# Patient Record
Sex: Female | Born: 1965
Health system: Southern US, Community
[De-identification: ages and names within clinical notes are randomized; demographics above are authoritative.]

## PROBLEM LIST (undated history)

## (undated) DIAGNOSIS — M1990 Unspecified osteoarthritis, unspecified site: Secondary | ICD-10-CM

## (undated) DIAGNOSIS — J8410 Pulmonary fibrosis, unspecified: Secondary | ICD-10-CM

## (undated) DIAGNOSIS — D649 Anemia, unspecified: Secondary | ICD-10-CM

## (undated) DIAGNOSIS — K219 Gastro-esophageal reflux disease without esophagitis: Secondary | ICD-10-CM

## (undated) HISTORY — DX: Unspecified osteoarthritis, unspecified site: M19.90

## (undated) HISTORY — DX: Gastro-esophageal reflux disease without esophagitis: K21.9

## (undated) HISTORY — DX: Anemia, unspecified: D64.9

---

## 1997-06-20 ENCOUNTER — Inpatient Hospital Stay (HOSPITAL_COMMUNITY): Admission: AD | Admit: 1997-06-20 | Discharge: 1997-06-20 | Payer: Self-pay | Admitting: Obstetrics

## 1997-08-31 ENCOUNTER — Ambulatory Visit (HOSPITAL_COMMUNITY): Admission: RE | Admit: 1997-08-31 | Discharge: 1997-08-31 | Payer: Self-pay | Admitting: Obstetrics & Gynecology

## 1997-12-21 ENCOUNTER — Inpatient Hospital Stay (HOSPITAL_COMMUNITY): Admission: AD | Admit: 1997-12-21 | Discharge: 1997-12-21 | Payer: Self-pay | Admitting: Obstetrics

## 1997-12-30 ENCOUNTER — Inpatient Hospital Stay (HOSPITAL_COMMUNITY): Admission: AD | Admit: 1997-12-30 | Discharge: 1998-01-01 | Payer: Self-pay | Admitting: *Deleted

## 1999-07-09 ENCOUNTER — Encounter: Admission: RE | Admit: 1999-07-09 | Discharge: 1999-07-09 | Payer: Self-pay | Admitting: Family Medicine

## 2001-06-06 ENCOUNTER — Encounter (INDEPENDENT_AMBULATORY_CARE_PROVIDER_SITE_OTHER): Payer: Self-pay | Admitting: *Deleted

## 2001-06-06 LAB — CONVERTED CEMR LAB

## 2002-03-01 ENCOUNTER — Encounter: Admission: RE | Admit: 2002-03-01 | Discharge: 2002-03-01 | Payer: Self-pay | Admitting: Family Medicine

## 2002-03-29 ENCOUNTER — Encounter: Admission: RE | Admit: 2002-03-29 | Discharge: 2002-03-29 | Payer: Self-pay | Admitting: Sports Medicine

## 2002-05-10 ENCOUNTER — Encounter: Admission: RE | Admit: 2002-05-10 | Discharge: 2002-05-10 | Payer: Self-pay | Admitting: Family Medicine

## 2002-09-22 ENCOUNTER — Encounter: Admission: RE | Admit: 2002-09-22 | Discharge: 2002-09-22 | Payer: Self-pay | Admitting: Family Medicine

## 2002-10-04 ENCOUNTER — Encounter: Admission: RE | Admit: 2002-10-04 | Discharge: 2002-10-04 | Payer: Self-pay | Admitting: Family Medicine

## 2003-02-26 ENCOUNTER — Encounter: Payer: Self-pay | Admitting: Family Medicine

## 2003-02-26 ENCOUNTER — Emergency Department (HOSPITAL_COMMUNITY): Admission: AD | Admit: 2003-02-26 | Discharge: 2003-02-26 | Payer: Self-pay | Admitting: Family Medicine

## 2003-03-01 ENCOUNTER — Encounter: Admission: RE | Admit: 2003-03-01 | Discharge: 2003-03-01 | Payer: Self-pay | Admitting: Sports Medicine

## 2003-08-24 ENCOUNTER — Encounter: Admission: RE | Admit: 2003-08-24 | Discharge: 2003-08-24 | Payer: Self-pay | Admitting: Family Medicine

## 2004-01-13 ENCOUNTER — Emergency Department (HOSPITAL_COMMUNITY): Admission: EM | Admit: 2004-01-13 | Discharge: 2004-01-13 | Payer: Self-pay | Admitting: Emergency Medicine

## 2004-06-14 ENCOUNTER — Ambulatory Visit: Payer: Self-pay | Admitting: Family Medicine

## 2004-10-19 ENCOUNTER — Ambulatory Visit: Payer: Self-pay | Admitting: Sports Medicine

## 2006-07-03 DIAGNOSIS — K649 Unspecified hemorrhoids: Secondary | ICD-10-CM | POA: Insufficient documentation

## 2006-07-03 DIAGNOSIS — F339 Major depressive disorder, recurrent, unspecified: Secondary | ICD-10-CM | POA: Insufficient documentation

## 2006-07-04 ENCOUNTER — Encounter (INDEPENDENT_AMBULATORY_CARE_PROVIDER_SITE_OTHER): Payer: Self-pay | Admitting: *Deleted

## 2006-12-01 ENCOUNTER — Encounter: Payer: Self-pay | Admitting: Family Medicine

## 2007-07-27 ENCOUNTER — Ambulatory Visit: Payer: Self-pay | Admitting: Family Medicine

## 2007-07-27 ENCOUNTER — Encounter: Payer: Self-pay | Admitting: Family Medicine

## 2007-07-27 LAB — CONVERTED CEMR LAB
Chlamydia, DNA Probe: NEGATIVE
KOH Prep: NEGATIVE

## 2007-07-29 LAB — CONVERTED CEMR LAB: Pap Smear: NORMAL

## 2007-07-30 ENCOUNTER — Encounter: Payer: Self-pay | Admitting: Family Medicine

## 2007-08-07 ENCOUNTER — Encounter: Payer: Self-pay | Admitting: Family Medicine

## 2007-08-18 ENCOUNTER — Ambulatory Visit: Payer: Self-pay | Admitting: Family Medicine

## 2007-08-18 LAB — CONVERTED CEMR LAB
AST: 17 units/L (ref 0–37)
Albumin: 4.3 g/dL (ref 3.5–5.2)
Alkaline Phosphatase: 67 units/L (ref 39–117)
BUN: 15 mg/dL (ref 6–23)
HDL: 49 mg/dL (ref >39)
Hemoglobin: 13.6 g/dL (ref 12.0–15.0)
LDL Cholesterol: 120 mg/dL — ABNORMAL HIGH (ref 0–99)
MCHC: 33.3 g/dL (ref 30.0–36.0)
MCV: 92.3 fL (ref 78.0–100.0)
Potassium: 4.4 meq/L (ref 3.5–5.3)
RDW: 12.6 % (ref 11.5–15.5)
TSH: 1.985 microintl units/mL (ref 0.350–5.50)
Total Bilirubin: 0.6 mg/dL (ref 0.3–1.2)
Total CHOL/HDL Ratio: 3.8
Triglycerides: 91 mg/dL (ref <150)
VLDL: 18 mg/dL (ref 0–40)

## 2007-08-21 ENCOUNTER — Ambulatory Visit (HOSPITAL_COMMUNITY): Admission: RE | Admit: 2007-08-21 | Discharge: 2007-08-21 | Payer: Self-pay | Admitting: Family Medicine

## 2007-08-25 ENCOUNTER — Encounter: Payer: Self-pay | Admitting: Family Medicine

## 2008-01-15 ENCOUNTER — Encounter: Payer: Self-pay | Admitting: Family Medicine

## 2008-03-28 ENCOUNTER — Ambulatory Visit: Payer: Self-pay | Admitting: Family Medicine

## 2008-06-13 ENCOUNTER — Ambulatory Visit: Payer: Self-pay | Admitting: Family Medicine

## 2008-06-13 DIAGNOSIS — R3129 Other microscopic hematuria: Secondary | ICD-10-CM | POA: Insufficient documentation

## 2008-06-13 DIAGNOSIS — K089 Disorder of teeth and supporting structures, unspecified: Secondary | ICD-10-CM | POA: Insufficient documentation

## 2008-06-13 DIAGNOSIS — R109 Unspecified abdominal pain: Secondary | ICD-10-CM

## 2008-06-13 DIAGNOSIS — M26629 Arthralgia of temporomandibular joint, unspecified side: Secondary | ICD-10-CM | POA: Insufficient documentation

## 2008-06-13 LAB — CONVERTED CEMR LAB
Bilirubin Urine: NEGATIVE
Nitrite: NEGATIVE
Protein, U semiquant: NEGATIVE
Urobilinogen, UA: 0.2
WBC Urine, dipstick: NEGATIVE

## 2009-03-07 ENCOUNTER — Ambulatory Visit: Payer: Self-pay | Admitting: Family Medicine

## 2009-03-07 ENCOUNTER — Observation Stay (HOSPITAL_COMMUNITY): Admission: EM | Admit: 2009-03-07 | Discharge: 2009-03-08 | Payer: Self-pay | Admitting: Emergency Medicine

## 2009-03-07 ENCOUNTER — Encounter: Payer: Self-pay | Admitting: Family Medicine

## 2009-03-07 DIAGNOSIS — R079 Chest pain, unspecified: Secondary | ICD-10-CM

## 2009-03-13 ENCOUNTER — Ambulatory Visit: Payer: Self-pay | Admitting: Family Medicine

## 2009-03-13 ENCOUNTER — Ambulatory Visit (HOSPITAL_COMMUNITY): Admission: RE | Admit: 2009-03-13 | Discharge: 2009-03-13 | Payer: Self-pay | Admitting: Family Medicine

## 2009-03-13 DIAGNOSIS — M79609 Pain in unspecified limb: Secondary | ICD-10-CM

## 2009-03-13 DIAGNOSIS — M542 Cervicalgia: Secondary | ICD-10-CM

## 2009-03-14 ENCOUNTER — Encounter: Payer: Self-pay | Admitting: *Deleted

## 2009-04-12 ENCOUNTER — Ambulatory Visit: Payer: Self-pay | Admitting: Sports Medicine

## 2009-04-12 ENCOUNTER — Ambulatory Visit (HOSPITAL_COMMUNITY): Admission: RE | Admit: 2009-04-12 | Discharge: 2009-04-12 | Payer: Self-pay | Admitting: Sports Medicine

## 2009-05-06 HISTORY — PX: CHOLECYSTECTOMY: SHX55

## 2009-05-26 ENCOUNTER — Observation Stay (HOSPITAL_COMMUNITY): Admission: EM | Admit: 2009-05-26 | Discharge: 2009-05-27 | Payer: Self-pay | Admitting: Internal Medicine

## 2009-05-26 ENCOUNTER — Encounter (INDEPENDENT_AMBULATORY_CARE_PROVIDER_SITE_OTHER): Payer: Self-pay

## 2009-06-05 ENCOUNTER — Ambulatory Visit: Payer: Self-pay | Admitting: Family Medicine

## 2009-06-05 LAB — CONVERTED CEMR LAB: Rapid Strep: NEGATIVE

## 2010-03-24 ENCOUNTER — Encounter: Payer: Self-pay | Admitting: Family Medicine

## 2010-05-11 ENCOUNTER — Emergency Department (HOSPITAL_COMMUNITY)
Admission: EM | Admit: 2010-05-11 | Discharge: 2010-05-12 | Payer: Self-pay | Source: Home / Self Care | Admitting: Emergency Medicine

## 2010-06-05 NOTE — Assessment & Plan Note (Signed)
Summary: prob list rev   

## 2010-06-05 NOTE — Assessment & Plan Note (Signed)
Summary: cough & cold/[employer]/Olivia [name]   Vital Signs:  Patient profile:   45 year old female Height:      59.5 inches Weight:      153 pounds BMI:     30.49 Temp:     97.9 degrees F oral Pulse rate:   74 / minute BP sitting:   105 / 78  (right arm) Cuff size:   regular  Vitals Entered By: Tessie Fass CMA (June 05, 2009 1:44 PM) CC: cough, headache and bodyache Pain Assessment Patient in pain? yes     Location: head   Primary Care Provider:  Zachery Dauer MD  CC:  cough and headache and bodyache.  History of Present Illness:  **NOTE: entire visit conducted with interpreter Wyvonnia Dusky present  1. cough, cold, sore throat 1 week of cough, cold, HA and ST. Has had multiple family members with similar symptoms. Has taken tylenol without much improvement  Note: recently d/c'd from hospital after GB removal 10 days ago; only has a small amount of residual pain from that  ROS: + subjective fever, chill; no chest pain, bowel or bladder problems; no diarrhea, nausea or vomiting.  Habits & Providers  Alcohol-Tobacco-Diet     Tobacco Status: never  Current Medications (verified): 1)  None  Allergies (verified): 1)  ! Penicillin  Past History:  Past Surgical History: GC/Chlamydia neg - 07/02/2001, IUD - 01/04/1998 cholecystectomy Jan '11.   Physical Exam  Additional Exam:  General:  Vital signs reviewed -- overweight but otherwise normal Alert, appropriate; well-dressed and well-nourished HEENT: OP pink and moist no erythema or exudate; R TM mildly erythematous, L TM normal;  Neck: mild bilateral anterior cervical LAD Lungs:  work of breathing unlabored, clear to auscultation bilaterally; no wheezes, rales, or ronchi; good air movement throughout Heart:  regular rate and rhythm, no murmurs; normal s1/s2 Abd: +BS, soft, non-tender, non-distended; no masses; no rebound or guarding; laparascopy sites c/d/i Extremities:  no cyanosis, clubbing, or edema Neurologic:  alert  and oriented. speech normal.    Impression & Recommendations:  Problem # 1:  UPPER RESPIRATORY INFECTION, VIRAL (ICD-465.9)  supportive care. see instructions  The following medications were removed from the medication list:    Advil 200 Mg Tabs (Ibuprofen) .Marland KitchenMarland KitchenMarland KitchenMarland Kitchen 3 tablets three times a day    Arthritis Pain Relief 650 Mg Cr-tabs (Acetaminophen) .Marland Kitchen... Take 2 tabs three times a day as needed pain Her updated medication list for this problem includes:    Ibuprofen 600 Mg Tabs (Ibuprofen) ..... One by mouth every 6-8 hours as needed for headache, fever; please give instructions in spanish    Hydrocodone-homatropine 5-1.5 Mg/73ml Syrp (Hydrocodone-homatropine) .Marland Kitchen... 1 teaspoon every 12 hours as needed; disp 60 cc;  please give instructions in spanish  Orders: [facility]- Est Level  3 (29528)  Complete Medication List: 1)  Pseudoephedrine Hcl 60 Mg Tabs (Pseudoephedrine hcl) .... Take one by mouth every 6 hours as needed for congestion; please give instructions in spanish 2)  Ibuprofen 600 Mg Tabs (Ibuprofen) .... One by mouth every 6-8 hours as needed for headache, fever; please give instructions in spanish 3)  Hydrocodone-homatropine 5-1.5 Mg/3ml Syrp (Hydrocodone-homatropine) .Marland Kitchen.. 1 teaspoon every 12 hours as needed; disp 60 cc;  please give instructions in spanish  Other Orders: Rapid Strep-[facility] (41324)  Patient Instructions: 1)  Beba muchos liquidos.  2)  Tome la pseudoephedrine cada 6 horas por su congestion 3)  Tome ibuprofen para dolor de cabeza 4)  si usted no mejora en 3-5  dias, [place] Prescriptions: HYDROCODONE-HOMATROPINE 5-1.5 MG/5ML SYRP (HYDROCODONE-HOMATROPINE) 1 teaspoon every 12 hours as needed; disp 60 cc;  please give instructions in Spanish  #1 x 0   Entered and Authorized by:   Myrtie Soman  MD   Signed by:   Myrtie Soman  MD on 06/05/2009   Method used:   Print then Give to Patient   RxID:   [id number] IBUPROFEN 600 MG TABS (IBUPROFEN) one by mouth  every 6-8 hours as needed for headache, fever; please give instructions in Spanish  #30 x 0   Entered and Authorized by:   Myrtie Soman  MD   Signed by:   Myrtie Soman  MD on 06/05/2009   Method used:   Print then Give to Patient   RxID:   [id number] PSEUDOEPHEDRINE HCL 60 MG TABS (PSEUDOEPHEDRINE HCL) take one by mouth every 6 hours as needed for congestion; please give instructions in spanish  #30 x 0   Entered and Authorized by:   Myrtie Soman  MD   Signed by:   Myrtie Soman  MD on 06/05/2009   Method used:   Print then Give to Patient   RxID:   [id number]   Laboratory Results  Date/Time Received: June 05, 2009 1:56 PM  Date/Time Reported: June 05, 2009 2:08 PM   Other Tests  Rapid Strep: negative Comments: ...........test performed by...........Marland KitchenTerese Door, CMA

## 2010-06-07 ENCOUNTER — Encounter: Payer: Self-pay | Admitting: *Deleted

## 2010-07-22 LAB — URINE MICROSCOPIC-ADD ON

## 2010-07-22 LAB — COMPREHENSIVE METABOLIC PANEL
ALT: 17 U/L (ref 0–35)
AST: 21 U/L (ref 0–37)
Albumin: 3.9 g/dL (ref 3.5–5.2)
Alkaline Phosphatase: 57 U/L (ref 39–117)
GFR calc Af Amer: >60 mL/min (ref >60)
Potassium: 3.5 mEq/L (ref 3.5–5.1)
Sodium: 130 mEq/L — ABNORMAL LOW (ref 135–145)
Total Protein: 7.2 g/dL (ref 6.0–8.3)

## 2010-07-22 LAB — DIFFERENTIAL
Basophils Relative: 1 % (ref 0–1)
Eosinophils Absolute: 0.4 10*3/uL (ref 0.0–0.7)
Lymphs Abs: 2.5 10*3/uL (ref 0.7–4.0)
Monocytes Absolute: 0.6 10*3/uL (ref 0.1–1.0)
Monocytes Relative: 7 % (ref 3–12)

## 2010-07-22 LAB — URINALYSIS, ROUTINE W REFLEX MICROSCOPIC
Glucose, UA: NEGATIVE mg/dL
Ketones, ur: NEGATIVE mg/dL
Protein, ur: NEGATIVE mg/dL
pH: 6 (ref 5.0–8.0)

## 2010-07-22 LAB — CBC
Hemoglobin: 13.7 g/dL (ref 12.0–15.0)
Platelets: 182 10*3/uL (ref 150–400)
RDW: 12.7 % (ref 11.5–15.5)

## 2010-08-08 LAB — COMPREHENSIVE METABOLIC PANEL
ALT: 20 U/L (ref 0–35)
AST: 26 U/L (ref 0–37)
Alkaline Phosphatase: 60 U/L (ref 39–117)
CO2: 23 mEq/L (ref 19–32)
Calcium: 9 mg/dL (ref 8.4–10.5)
Calcium: 9.1 mg/dL (ref 8.4–10.5)
Creatinine, Ser: 0.6 mg/dL (ref 0.4–1.2)
GFR calc Af Amer: >60 mL/min (ref >60)
GFR calc non Af Amer: >60 mL/min (ref >60)
Glucose, Bld: 104 mg/dL — ABNORMAL HIGH (ref 70–99)
Glucose, Bld: 98 mg/dL (ref 70–99)
Potassium: 4 mEq/L (ref 3.5–5.1)
Sodium: 137 mEq/L (ref 135–145)
Total Protein: 7.4 g/dL (ref 6.0–8.3)

## 2010-08-08 LAB — POCT CARDIAC MARKERS
CKMB, poc: 1 ng/mL (ref 1.0–8.0)
CKMB, poc: <1 ng/mL — ABNORMAL LOW (ref 1.0–8.0)
Troponin i, poc: <0.05 ng/mL (ref 0.00–0.09)

## 2010-08-08 LAB — CBC
Hemoglobin: 13.9 g/dL (ref 12.0–15.0)
MCHC: 34.5 g/dL (ref 30.0–36.0)
RBC: 4.3 MIL/uL (ref 3.87–5.11)
RDW: 12.1 % (ref 11.5–15.5)

## 2010-08-08 LAB — DIFFERENTIAL
Basophils Relative: 1 % (ref 0–1)
Eosinophils Absolute: 0.3 10*3/uL (ref 0.0–0.7)
Eosinophils Relative: 5 % (ref 0–5)
Lymphs Abs: 1.3 10*3/uL (ref 0.7–4.0)
Monocytes Absolute: 0.5 10*3/uL (ref 0.1–1.0)
Monocytes Relative: 8 % (ref 3–12)
Neutrophils Relative %: 66 % (ref 43–77)

## 2010-08-08 LAB — URINALYSIS, ROUTINE W REFLEX MICROSCOPIC
Bilirubin Urine: NEGATIVE
Glucose, UA: NEGATIVE mg/dL
Ketones, ur: NEGATIVE mg/dL
Leukocytes, UA: NEGATIVE
Protein, ur: NEGATIVE mg/dL
pH: 6 (ref 5.0–8.0)

## 2010-08-08 LAB — URINE MICROSCOPIC-ADD ON

## 2010-08-08 LAB — CK TOTAL AND CKMB (NOT AT ARMC)
CK, MB: 1.6 ng/mL (ref 0.3–4.0)
Total CK: 87 U/L (ref 7–177)

## 2010-08-08 LAB — LIPID PANEL
Cholesterol: 179 mg/dL (ref 0–200)
HDL: 42 mg/dL (ref >39)
LDL Cholesterol: 125 mg/dL — ABNORMAL HIGH (ref 0–99)
Triglycerides: 60 mg/dL (ref <150)

## 2010-08-08 LAB — CARDIAC PANEL(CRET KIN+CKTOT+MB+TROPI): Relative Index: INVALID (ref 0.0–2.5)

## 2010-08-08 LAB — TROPONIN I: Troponin I: 0.01 ng/mL (ref 0.00–0.06)

## 2011-01-29 IMAGING — CR DG CHEST 1V PORT
1 series · 1 of 1 positions shown · non-contrast
Comparison: Report 02/26/2003 no images available

CLINICAL DATA: Chest pain

PORTABLE CHEST - 1 VIEW

[view not recorded]
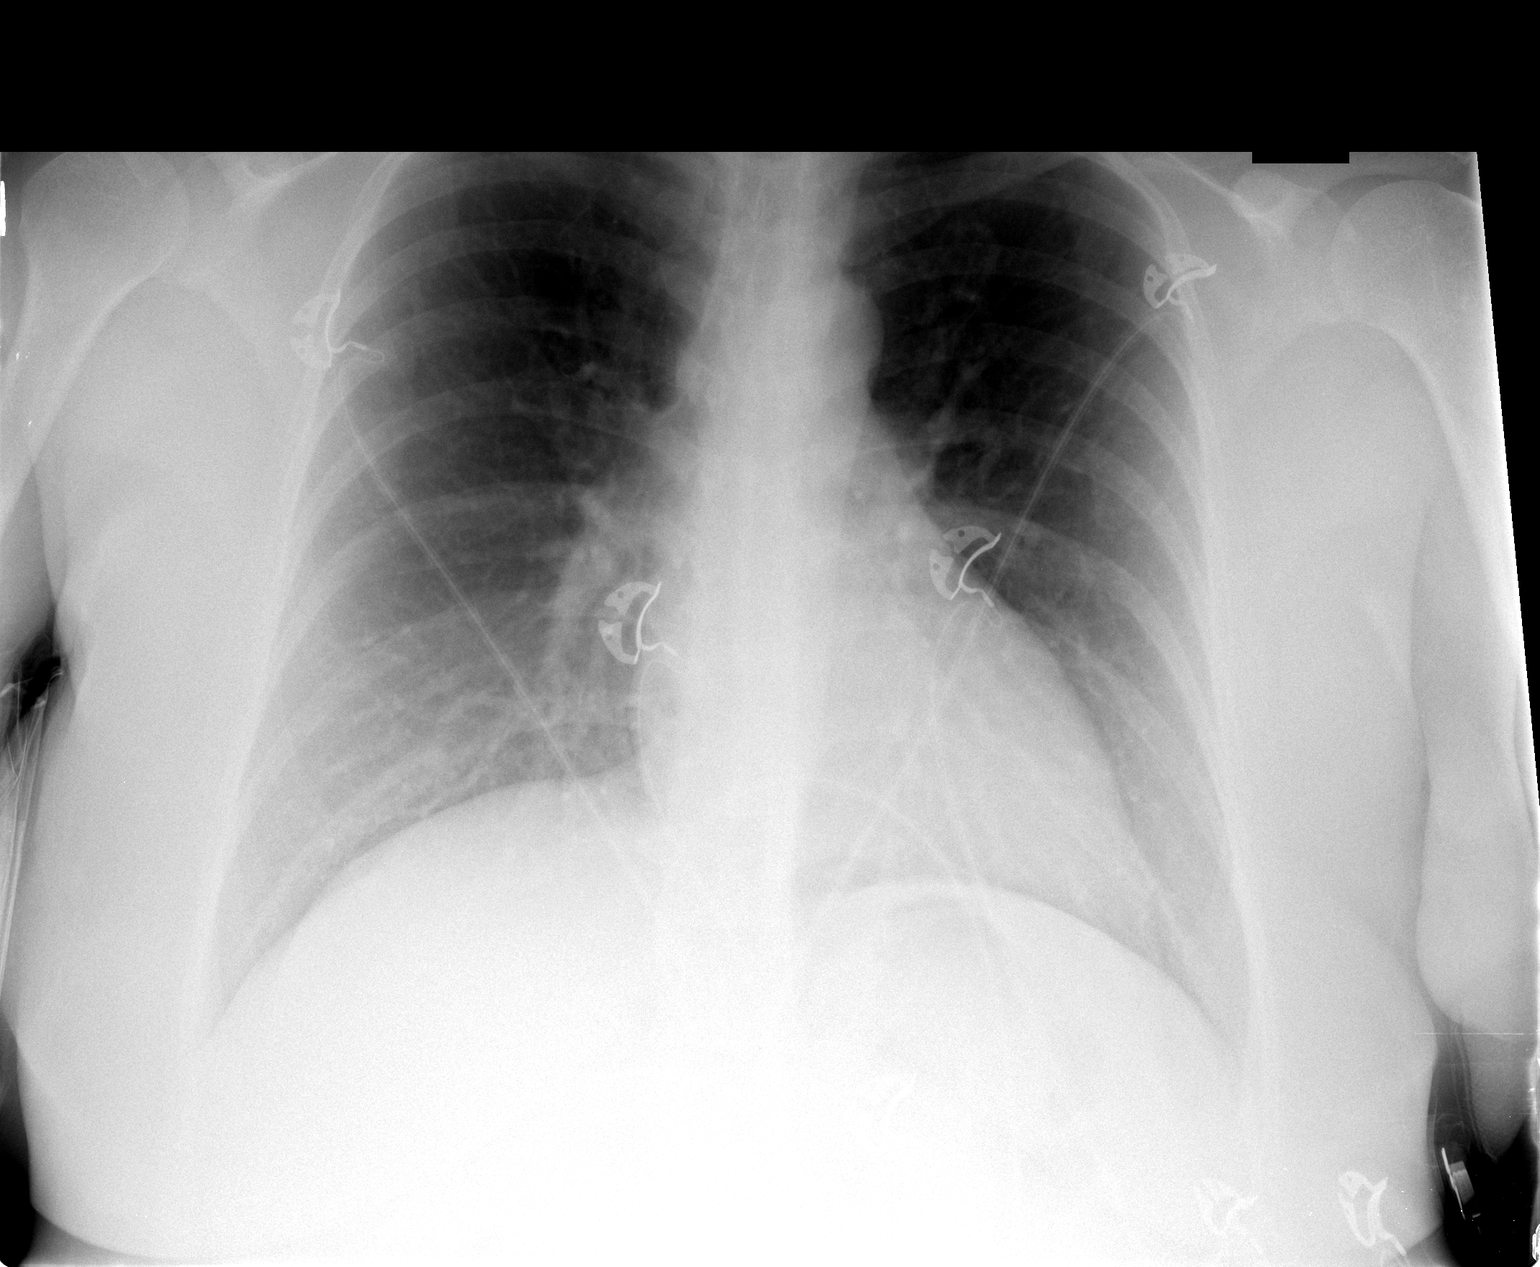

[1 of 1 positions shown; findings below may reference images not displayed]

FINDINGS: Cardiomediastinal silhouette is unremarkable.  No acute
infiltrate or pleural effusion.  No pulmonary edema.  Bony thorax
is unremarkable.
IMPRESSION: No active disease.

## 2011-04-19 IMAGING — RF DG CHOLANGIOGRAM OPERATIVE
1 series · 4 of 4 positions shown · non-contrast
Comparison: None.

CLINICAL DATA: Gallstones.  Cholecystectomy.

INTRAOPERATIVE CHOLANGIOGRAM
TECHNIQUE: Cholangiographic images from the C-arm fluoroscopic
device were submitted for interpretation post-operatively.  Please
see the procedural report for the amount of contrast and the
fluoroscopy time utilized.

[Series 1: run · 4 of 33 frames shown]
[frame 1/33]
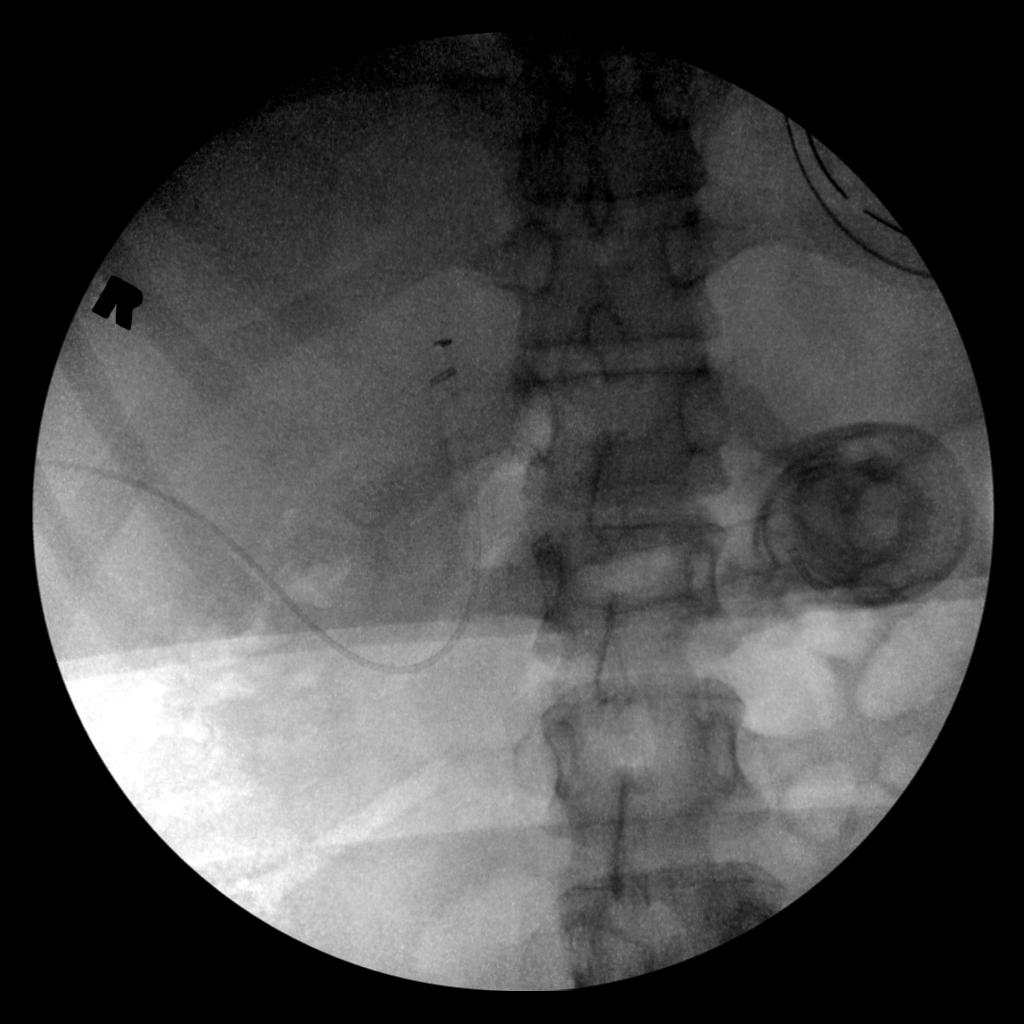
[frame 5/33]
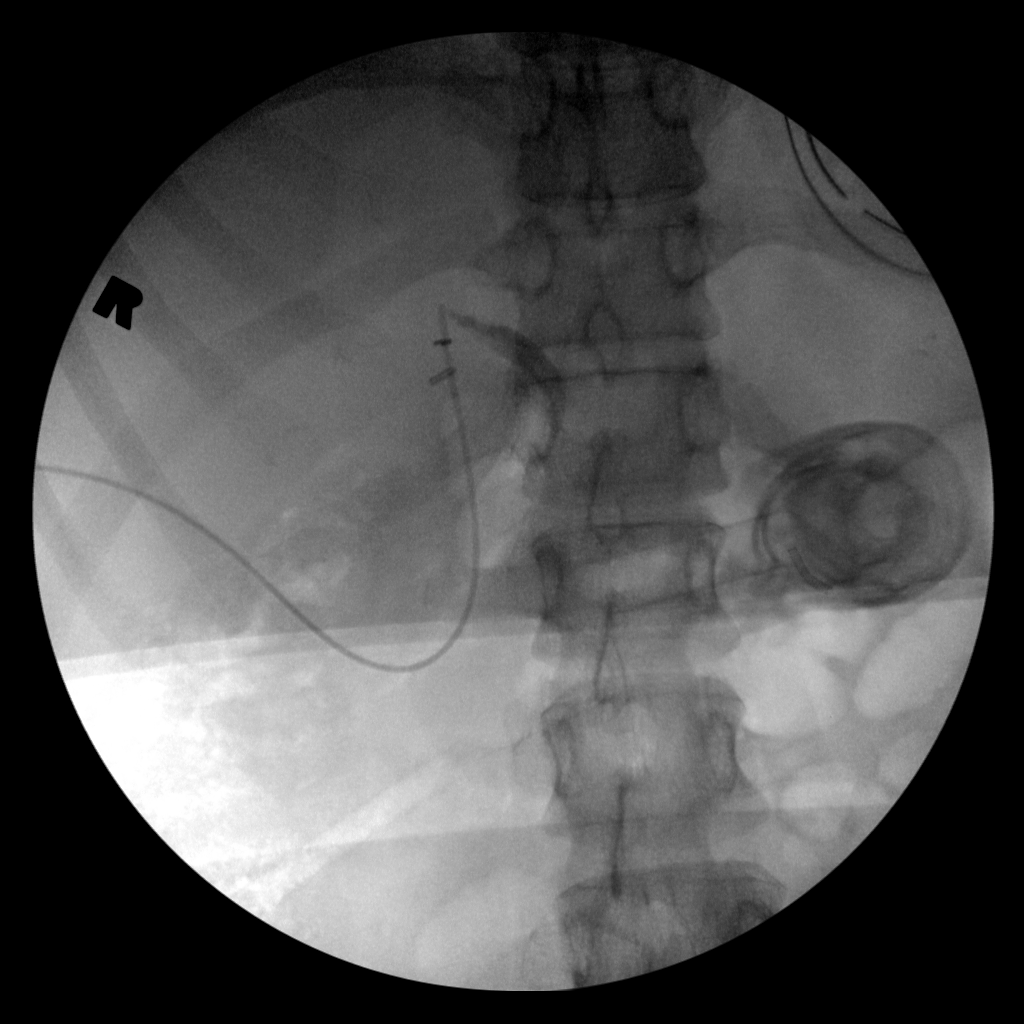
[frame 17/33]
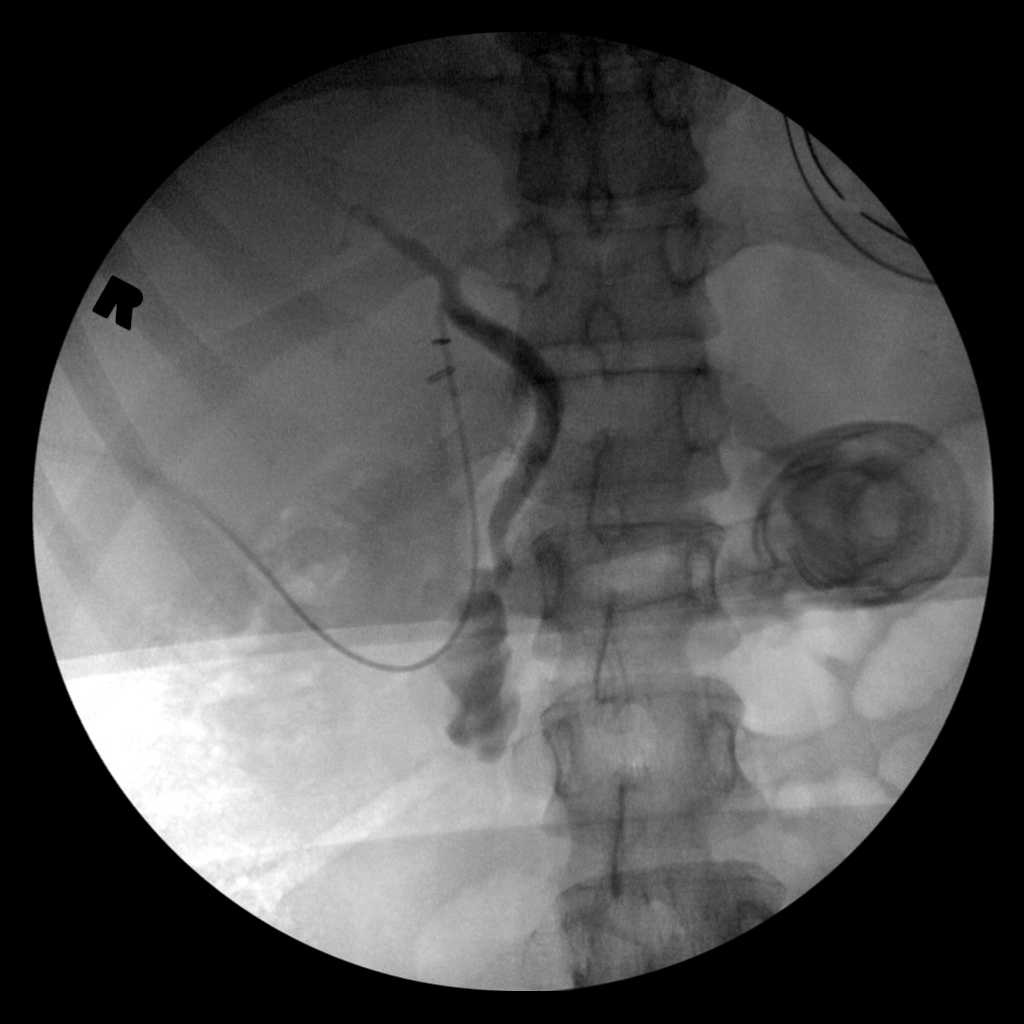
[frame 29/33]
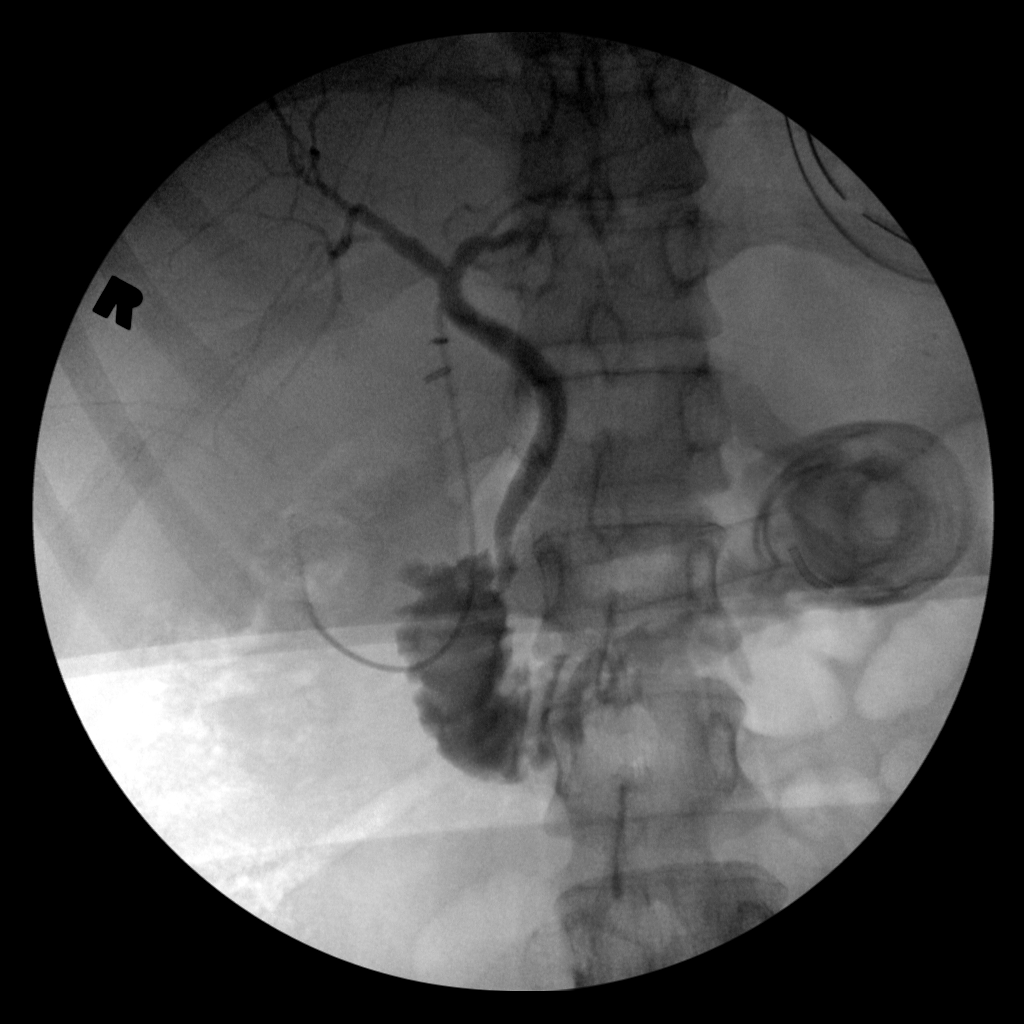

[4 of 4 positions shown; findings below may reference images not displayed]

FINDINGS: We are provided with a series of fluoroscopic
intraoperative spot views from cholangiogram.  Biliary and
visualized hepatic ducts appear normal.  No filling defect or
stricture.  No leakage of contrast material.  Contrast flows into
the duodenum.
IMPRESSION: Normal cholangiogram after cholecystectomy.

## 2011-07-08 ENCOUNTER — Encounter: Payer: Self-pay | Admitting: *Deleted

## 2011-07-15 ENCOUNTER — Other Ambulatory Visit: Payer: Self-pay | Admitting: Obstetrics and Gynecology

## 2011-07-15 DIAGNOSIS — Z1231 Encounter for screening mammogram for malignant neoplasm of breast: Secondary | ICD-10-CM

## 2011-07-16 ENCOUNTER — Ambulatory Visit (HOSPITAL_COMMUNITY)
Admission: RE | Admit: 2011-07-16 | Discharge: 2011-07-16 | Disposition: A | Discharge status: Home / Self Care | Payer: Self-pay | Source: Ambulatory Visit | Attending: Obstetrics and Gynecology | Admitting: Obstetrics and Gynecology

## 2011-07-16 ENCOUNTER — Ambulatory Visit (INDEPENDENT_AMBULATORY_CARE_PROVIDER_SITE_OTHER): Payer: Self-pay | Admitting: *Deleted

## 2011-07-16 VITALS — BP 121/80 | HR 70 | Temp 97.7°F | Ht 59.5 in | Wt 160.9 lb

## 2011-07-16 DIAGNOSIS — Z01419 Encounter for gynecological examination (general) (routine) without abnormal findings: Secondary | ICD-10-CM

## 2011-07-16 DIAGNOSIS — Z1231 Encounter for screening mammogram for malignant neoplasm of breast: Secondary | ICD-10-CM

## 2011-07-16 NOTE — Progress Notes (Signed)
Complaints of left breast tenderness.  Pap Smear:    Pap smear completed today. Patient stated last Pap smear was in 2012 at one of the free Pap smear screenings at the [facility]. Unable to locate any results for 2012 therefore went ahead and completed Pap smear. Last Pap smear report in [facility] was 07/27/07 and was normal.  Per patient no history of abnormal Pap smears.  Physical exam: Breasts Breasts symmetrical. No skin abnormalities bilateral breasts. No nipple retraction bilateral breasts. No nipple discharge bilateral breasts. No lymphadenopathy. No lumps palpated bilateral breasts. Patient complained of tenderness in left outer breast.           Pelvic/Bimanual   Ext Genitalia No lesions, no swelling and no discharge observed on external genitalia.         Vagina Vagina pink and normal texture. No lesions or discharge observed in vagina.          Cervix Cervix is present. Cervix pink and of normal texture. Small amount of yellowish discharge observed on cervix.     Uterus Uterus is present and palpable. Uterus in normal position and normal size.        Adnexae Bilateral ovaries present and palpable. No tenderness on palpation.          Rectovaginal No rectal exam completed today since patient had no rectal complaints. No skin abnormalities observed on exam.

## 2011-07-16 NOTE — Patient Instructions (Signed)
Taught patient how to perform BSE and gave educational materials to take home. Let her know [facility] will cover Pap smears every 3 years unless has a history of abnormal Pap smears. Patient escorted to mammography for screening mammogram. Let patient know will follow up with her within the next couple weeks with results by letter or phone. Patient verbalized understanding.

## 2011-07-18 ENCOUNTER — Other Ambulatory Visit: Payer: Self-pay | Admitting: Obstetrics and Gynecology

## 2011-07-18 DIAGNOSIS — R928 Other abnormal and inconclusive findings on diagnostic imaging of breast: Secondary | ICD-10-CM

## 2011-07-25 ENCOUNTER — Telehealth: Payer: Self-pay | Admitting: *Deleted

## 2011-07-25 NOTE — Telephone Encounter (Signed)
Patient called me in regards to receiving call that she needs additional imaging for mammogram completed through [facility] on 07/16/11. Scheduled patient appointment at the [facility] for diagnostic mammogram on Wednesday, July 31, 2011 at 1400. Gave patient follow up appointment with interpreter. Also, gave phone to the [facility] if that appointment time does not work for patient. Patient verbalized understanding.

## 2011-07-31 ENCOUNTER — Other Ambulatory Visit: Payer: Self-pay

## 2011-08-08 ENCOUNTER — Ambulatory Visit
Admission: RE | Admit: 2011-08-08 | Discharge: 2011-08-08 | Disposition: A | Discharge status: Home / Self Care | Payer: No Typology Code available for payment source | Source: Ambulatory Visit | Attending: Obstetrics and Gynecology | Admitting: Obstetrics and Gynecology

## 2011-08-08 ENCOUNTER — Other Ambulatory Visit: Payer: Self-pay

## 2011-08-08 DIAGNOSIS — R928 Other abnormal and inconclusive findings on diagnostic imaging of breast: Secondary | ICD-10-CM

## 2011-08-08 NOTE — Progress Notes (Signed)
Interpreter Wyvonnia Dusky at [facility] for mammogram

## 2011-08-16 ENCOUNTER — Telehealth: Payer: Self-pay | Admitting: *Deleted

## 2011-08-16 NOTE — Telephone Encounter (Signed)
Called patient with interpreter Nira Conn to follow up on results of diagnostic mammogram. Patient stated she has received results and aware that she will need a screening mammogram in 1 year. Patient verbalized understanding.

## 2012-06-22 ENCOUNTER — Encounter: Payer: Self-pay | Admitting: Family Medicine

## 2012-06-22 ENCOUNTER — Ambulatory Visit (INDEPENDENT_AMBULATORY_CARE_PROVIDER_SITE_OTHER): Payer: Self-pay | Admitting: Family Medicine

## 2012-06-22 VITALS — BP 134/89 | HR 69 | Ht 59.0 in | Wt 155.9 lb

## 2012-06-22 DIAGNOSIS — T8332XA Displacement of intrauterine contraceptive device, initial encounter: Secondary | ICD-10-CM | POA: Insufficient documentation

## 2012-06-22 DIAGNOSIS — T8389XA Other specified complication of genitourinary prosthetic devices, implants and grafts, initial encounter: Secondary | ICD-10-CM

## 2012-06-22 NOTE — Progress Notes (Signed)
Subjective:     Patient ID: Olivia [name], female   DOB: 12/06/1965, 47 y.o.   MRN: [id number]  HPI When she had her pap smear at [facility] they couldn't find the string of her IUD. She doesn't have symptoms of pregnancy, but it afraid of the possibility. She generally feels well. Doesn't currently have the orange card and can't afford $250 for an ultrasound.   Review of Systems     Objective:   Physical Exam  Abdominal: Soft. She exhibits no distension and no mass. There is no tenderness.  Genitourinary: Vagina normal and uterus normal. No vaginal discharge found.  I couldn't find IUD strings in the cervical os despite exploring the opening with the cytobrush  Neurological: She is alert.  Psychiatric: She has a normal mood and affect. Her behavior is normal. Thought content normal.       Assessment:         Plan:

## 2012-06-22 NOTE — Progress Notes (Signed)
Interpreter Laquanna Veazey [facility] for [facility] 

## 2012-06-22 NOTE — Patient Instructions (Signed)
No puedo ver los hilos del dispositivo. Si tiene sintomas del Engineer, maintenance (IT) una prueba de [place]. La unica metodo de asegurar que el DI esta en la matriz es hacer un ultrasonido. Favor de contactarme si quiere arreglar [employer].   I can't see the strings of the IUD. If you have symptoms of pregnancy, you should do an over the counter pregnancy test. The only method of being sure that the IUD is in the uterus is to do an ultrasound test or xray.

## 2012-07-30 ENCOUNTER — Encounter: Payer: Self-pay | Admitting: Medical

## 2012-09-14 ENCOUNTER — Ambulatory Visit (INDEPENDENT_AMBULATORY_CARE_PROVIDER_SITE_OTHER): Payer: No Typology Code available for payment source | Admitting: Emergency Medicine

## 2012-09-14 ENCOUNTER — Encounter: Payer: Self-pay | Admitting: Emergency Medicine

## 2012-09-14 VITALS — BP 115/75 | HR 64 | Ht 59.0 in | Wt 152.0 lb

## 2012-09-14 DIAGNOSIS — T8332XD Displacement of intrauterine contraceptive device, subsequent encounter: Secondary | ICD-10-CM

## 2012-09-14 DIAGNOSIS — Z5189 Encounter for other specified aftercare: Secondary | ICD-10-CM

## 2012-09-14 DIAGNOSIS — Z30431 Encounter for routine checking of intrauterine contraceptive device: Secondary | ICD-10-CM | POA: Insufficient documentation

## 2012-09-14 NOTE — Progress Notes (Signed)
  Subjective:    Patient ID: Olivia [name], female    DOB: Jan 22, 1966, 47 y.o.   MRN: [id number]  HPI Olivia [name] is here for f/u of IUD.  Language line was used for interpreting.  She states that several months ago, the strings of her IUD were unable to be visualized.  She was unable to get an ultrasound at that time as her orange card had expired.  Her orange card has been renewed and she would like the ultrasound now.  IUD is due to be removed in 03/2013.  She does not desire pregnancy.  Denies any chance of pregnancy currently.  Does not have periods with the IUD.  I have reviewed and updated the following as appropriate: allergies and current medications SHx: never smoker  Review of Systems See HPI    Objective:   Physical Exam BP 115/75  Pulse 64  Ht 4\' 11"  (1.499 m)  Wt 152 lb (68.947 kg)  BMI 30.68 kg/m2 Gen: alert, cooperative, NAD Pelvic: normal external genitalia, normal vagina, normal cervix, strings were visualized using cytobrush in      Assessment & Plan:

## 2012-09-14 NOTE — Assessment & Plan Note (Signed)
Previously unable to visualize the strings.  Strings visualized today.  IUD due to be removed in 03/2013, will likely need replacement as she does not desire pregnancy.

## 2012-09-14 NOTE — Patient Instructions (Addendum)
Fue bueno verte!  El DIU est en su lugar.  Por favor, el seguimiento en noviembre para que se lo retiren y Microbiologist si es necesario.

## 2013-01-15 ENCOUNTER — Ambulatory Visit (INDEPENDENT_AMBULATORY_CARE_PROVIDER_SITE_OTHER): Payer: No Typology Code available for payment source | Admitting: Family Medicine

## 2013-01-15 ENCOUNTER — Encounter: Payer: Self-pay | Admitting: Family Medicine

## 2013-01-15 VITALS — BP 127/83 | HR 71 | Temp 98.6°F | Ht 59.25 in | Wt 147.0 lb

## 2013-01-15 DIAGNOSIS — Z23 Encounter for immunization: Secondary | ICD-10-CM

## 2013-01-15 DIAGNOSIS — Z0000 Encounter for general adult medical examination without abnormal findings: Secondary | ICD-10-CM

## 2013-01-15 DIAGNOSIS — R51 Headache: Secondary | ICD-10-CM

## 2013-01-15 DIAGNOSIS — M722 Plantar fascial fibromatosis: Secondary | ICD-10-CM

## 2013-01-15 HISTORY — DX: Encounter for general adult medical examination without abnormal findings: Z00.00

## 2013-01-15 NOTE — Patient Instructions (Addendum)
Return to the office if your headache does not improve. I will send a letter with the results of your lab work.  Schedule and appointment for your lab draw.

## 2013-01-15 NOTE — Progress Notes (Signed)
Patient ID: Olivia [name], female   DOB: Jun 19, 1965, 47 y.o.   MRN: [id number] 47 y.o. year old female presents for well woman/preventative visit and annual GYN examination.  Acute Concerns: Patient has been having headaches of and on for the past 4 months, occurs on the left side of the posterior neck and radiates to occipital region, no light or sound sensitivity,patient is concerned that she may be starting menopause, her last period was in January of 2014, she has not had regular periods prior to this time as she is currently on [place] IUD, denies intermittent spotting, denies vaginal discharge, does have occasional hot flashes  She also has right foot pain on the plantar aspect under the heel, worse with ambulation initially in the morning, she is not taking any pain medications for this, has not iced regularly, does not stretch her foot   Diet: eats 3 meals a day, 0-1 servings of fruit per day, 2-3 servings of fruit per day, does not drink milk or eat yogurt, eats cheese a few times per week, drinks 1-2 sodas per week  Exercise: does no exercise regularly, she is active cleaning the house and running errands  Sexual/Birth History: married, has 3 children, one lives at home, she does not have any other sexual partners  Birth Control: Mirena IUD (expires in Nov. 2013).   Social:  History   Social History  . Marital Status: Married    Spouse Name: N/A    Number of Children: N/A  . Years of Education: N/A   Social History Main Topics  . Smoking status: Never Smoker   . Smokeless tobacco: None  . Alcohol Use: No     Comment: occasionally  . Drug Use: None  . Sexual Activity: Yes    Birth Control/ Protection: IUD   Other Topics Concern  . None   Social History Narrative   Native of [place], [place]   Husband, [place], alcoholic   3 children, Oldest son in [place]   Grandchildren, 3          Immunization:  Tdap/TD: Due for tetanus vaccine  Influenza: She declines  flu shot  Cancer Screening:    Mammogram: April 2014   Colonoscopy: Has never had  Pap Smear: April 2014 (negative)  Dexa: Not a candidate for  Physical Exam: VITALS: Reviewed GEN: Pleasant Hispanic female, accompanied by interpreter, no acute distress, alert and oriented x3 Hispanic female, accompanied by interpreter, no acute distress, alert and oriented x3 HEENT: Normocephalic, pupils are equal round and reactive to light, extraocular movements were intact, no scleral icterus, no conjunctival pallor, bilateral TMs are pearly-gray, no external ear canal erythema noted, nasal septum midline, no rhinorrhea, moist and there's members, uvula midline, no pharyngeal erythema or exudate noted, neck was supple, no thyromegaly, no anterior posterior lymphadenopathy appreciated CARDIAC: Regular in rhythm, S1 and S2 present, no murmurs, no heaves or thrills RESP: Clear to auscultation bilaterally, normal effort BREAST: Not performed as completed in April 2014  ABD: Soft, nontender, bowel sounds present, no rebound or guarding GU/GYN: Not performed as completed in April 2014  EXT: No edema, 2+ dorsalis pedis pulses, patient has tenderness to palpation on the plantar aspect of the right heel consistent with plantar fasciitis SKIN: No rashes, no scleral icterus MSK: No left trapezius or occipital tenderness noted, cervical neck range of motion was full Neuro: Cranial nerves II through XII are intact, gross motor was intact  ASSESSMENT & PLAN: 47 y.o. female presents for annual well woman/preventative exam and GYN exam. Please see problem specific assessment and plan.

## 2013-01-18 DIAGNOSIS — M722 Plantar fascial fibromatosis: Secondary | ICD-10-CM | POA: Insufficient documentation

## 2013-01-18 DIAGNOSIS — R51 Headache: Secondary | ICD-10-CM | POA: Insufficient documentation

## 2013-01-18 DIAGNOSIS — R519 Headache, unspecified: Secondary | ICD-10-CM | POA: Insufficient documentation

## 2013-01-18 NOTE — Assessment & Plan Note (Signed)
Left-sided occipital headaches likely tension in nature. -Recommended over-the-counter NSAIDs and gentle stretching. She may also use heating packs as needed.

## 2013-01-18 NOTE — Assessment & Plan Note (Addendum)
46 year old female presents for annual preventative visit. She is due for her tetanus vaccine. Declines flu shot. She is up-to-date on her breast and cervical cancer screening as this was completed at outside facility in April 2014. She is due for metabolic screening including CBC, BMP, TSH. Patient is concerned that she may be entering menopause. It is difficult to determine this at this time she currently has a Mirena IUD which may be contributing to amenorrhea. We'll monitor clinically. -Return to office in one year for annual well visit

## 2013-01-22 ENCOUNTER — Other Ambulatory Visit: Payer: No Typology Code available for payment source

## 2013-01-22 DIAGNOSIS — Z0000 Encounter for general adult medical examination without abnormal findings: Secondary | ICD-10-CM

## 2013-01-22 LAB — CBC WITH DIFFERENTIAL/PLATELET
Basophils Absolute: 0 10*3/uL (ref 0.0–0.1)
Basophils Relative: 1 % (ref 0–1)
Eosinophils Absolute: 0.4 10*3/uL (ref 0.0–0.7)
Eosinophils Relative: 9 % — ABNORMAL HIGH (ref 0–5)
HCT: 39.3 % (ref 36.0–46.0)
Hemoglobin: 13.5 g/dL (ref 12.0–15.0)
Lymphocytes Relative: 30 % (ref 12–46)
Lymphs Abs: 1.4 10*3/uL (ref 0.7–4.0)
MCH: 30.3 pg (ref 26.0–34.0)
MCHC: 34.4 g/dL (ref 30.0–36.0)
MCV: 88.3 fL (ref 78.0–100.0)
Monocytes Absolute: 0.4 10*3/uL (ref 0.1–1.0)
Monocytes Relative: 9 % (ref 3–12)
Neutro Abs: 2.4 10*3/uL (ref 1.7–7.7)
Neutrophils Relative %: 51 % (ref 43–77)
Platelets: 237 10*3/uL (ref 150–400)
RBC: 4.45 MIL/uL (ref 3.87–5.11)
RDW: 13.2 % (ref 11.5–15.5)
WBC: 4.7 10*3/uL (ref 4.0–10.5)

## 2013-01-22 LAB — BASIC METABOLIC PANEL
BUN: 16 mg/dL (ref 6–23)
Chloride: 105 mEq/L (ref 96–112)
Potassium: 4.2 mEq/L (ref 3.5–5.3)

## 2013-01-22 NOTE — Progress Notes (Signed)
BMP,CBC WITH DIFF AND TSH DONE TODAY Olivia [name] 

## 2013-01-23 LAB — TSH: TSH: 1.321 u[IU]/mL (ref 0.350–4.500)

## 2013-01-25 ENCOUNTER — Encounter: Payer: Self-pay | Admitting: Family Medicine

## 2013-02-15 ENCOUNTER — Encounter: Payer: Self-pay | Admitting: Family Medicine

## 2013-02-15 ENCOUNTER — Ambulatory Visit (INDEPENDENT_AMBULATORY_CARE_PROVIDER_SITE_OTHER): Payer: No Typology Code available for payment source | Admitting: Family Medicine

## 2013-02-15 VITALS — BP 120/77 | HR 62 | Temp 98.0°F | Ht 59.25 in | Wt 151.4 lb

## 2013-02-15 DIAGNOSIS — I83893 Varicose veins of bilateral lower extremities with other complications: Secondary | ICD-10-CM

## 2013-02-15 MED ORDER — IBUPROFEN 600 MG PO TABS: 600.0000 mg | ORAL_TABLET | Freq: Three times a day (TID) | ORAL | Status: DC | PRN

## 2013-02-15 NOTE — Progress Notes (Addendum)
Subjective:    Olivia [name] is a 47 y.o. female who presents to [facility] today for Left knee bruising:  1.  Left knee bruising:  Present since Thursday.  History of the same x 3 in past 2 years.  History of varicose veins as well bilaterally.  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  Felt pain and slight "pop" lateral aspect of Left knee (mild) and noted bruising at that time.  No pain in knee since that time except with squatting.  Bruising has persisted but is starting to diminish.  No swelling in legs BL.   Entire visit conducted with Wyvonnia Dusky present as interpreter.    The following portions of the patient's history were reviewed and updated as appropriate: allergies, current medications, past medical history, family and social history, and problem list. Patient is a nonsmoker.    PMH reviewed.  Past Medical History  Diagnosis Date  . Arthritis   . Anemia   . GERD (gastroesophageal reflux disease)    Past Surgical History  Procedure Laterality Date  . Cholecystectomy  05/2009    Medications reviewed. No current outpatient prescriptions on file.   No current facility-administered medications for this visit.    ROS as above otherwise neg.  No chest pain, palpitations, SOB, Fever, Chills, Abd pain, N/V/D.   Objective:   Physical Exam BP 120/77  Pulse 62  Temp(Src) 98 F (36.7 C)  Ht 4' 11.25" (1.505 m)  Wt 151 lb 6.4 oz (68.675 kg)  BMI 30.32 kg/m2 Gen:  Alert, cooperative patient who appears stated age in no acute  MSK:  Right knee WNL.  Left knee with no ligamentous laxity, ant/pos drawer negative, not walking with any limp.  Hamstring ligaments intact, knee flexion and extension strength 5/5. Exts:  Superficial bruising noted lateral aspect of Left knee.  No swelling BL ankles/calves.  No redness to knee or LE's.  LE's warm and well perfused.   No results found for this or any previous visit (from the past 72 hour(s)).

## 2013-02-15 NOTE — Assessment & Plan Note (Addendum)
No evidence of DVT.No MSK/knee issues.  Hamstring intact and without pain.  Analgesia and ice -- see instructions. No red flags Follow up if no relief 2 weeks or sooner if worsening.

## 2013-02-15 NOTE — Patient Instructions (Signed)
Ponga hielo en la rodilla por 20 minutos y descansado por 20 minutos.    Y la pastilla es ibuprofen.  Tome cada 8 horas si la necesita.

## 2013-04-07 ENCOUNTER — Ambulatory Visit: Payer: No Typology Code available for payment source

## 2013-12-30 ENCOUNTER — Ambulatory Visit: Payer: Self-pay

## 2014-03-07 ENCOUNTER — Encounter: Payer: Self-pay | Admitting: Family Medicine

## 2014-05-23 ENCOUNTER — Ambulatory Visit (INDEPENDENT_AMBULATORY_CARE_PROVIDER_SITE_OTHER): Payer: Self-pay | Admitting: Family Medicine

## 2014-05-23 ENCOUNTER — Encounter: Payer: Self-pay | Admitting: Family Medicine

## 2014-05-23 VITALS — BP 129/81 | HR 56 | Temp 98.4°F | Ht 59.0 in | Wt 159.0 lb

## 2014-05-23 DIAGNOSIS — E669 Obesity, unspecified: Secondary | ICD-10-CM

## 2014-05-23 DIAGNOSIS — I8390 Asymptomatic varicose veins of unspecified lower extremity: Secondary | ICD-10-CM

## 2014-05-23 DIAGNOSIS — Z0000 Encounter for general adult medical examination without abnormal findings: Secondary | ICD-10-CM

## 2014-05-23 DIAGNOSIS — I83899 Varicose veins of unspecified lower extremities with other complications: Secondary | ICD-10-CM

## 2014-05-23 DIAGNOSIS — I868 Varicose veins of other specified sites: Secondary | ICD-10-CM

## 2014-05-23 DIAGNOSIS — Z30431 Encounter for routine checking of intrauterine contraceptive device: Secondary | ICD-10-CM

## 2014-05-23 LAB — CBC WITH DIFFERENTIAL/PLATELET
Basophils Absolute: 0.1 10*3/uL (ref 0.0–0.1)
Basophils Relative: 1 % (ref 0–1)
Eosinophils Absolute: 0.6 10*3/uL (ref 0.0–0.7)
Eosinophils Relative: 9 % — ABNORMAL HIGH (ref 0–5)
HEMATOCRIT: 41.9 % (ref 36.0–46.0)
HEMOGLOBIN: 14.3 g/dL (ref 12.0–15.0)
LYMPHS ABS: 1.9 10*3/uL (ref 0.7–4.0)
LYMPHS PCT: 31 % (ref 12–46)
MCH: 30.6 pg (ref 26.0–34.0)
MCHC: 34.1 g/dL (ref 30.0–36.0)
MCV: 89.5 fL (ref 78.0–100.0)
MONOS PCT: 7 % (ref 3–12)
MPV: 12.1 fL (ref 8.6–12.4)
Monocytes Absolute: 0.4 10*3/uL (ref 0.1–1.0)
NEUTROS ABS: 3.2 10*3/uL (ref 1.7–7.7)
NEUTROS PCT: 52 % (ref 43–77)
Platelets: 273 10*3/uL (ref 150–400)
RBC: 4.68 MIL/uL (ref 3.87–5.11)
RDW: 12.8 % (ref 11.5–15.5)
WBC: 6.2 10*3/uL (ref 4.0–10.5)

## 2014-05-23 LAB — BASIC METABOLIC PANEL
BUN: 13 mg/dL (ref 6–23)
CALCIUM: 9.6 mg/dL (ref 8.4–10.5)
CO2: 27 meq/L (ref 19–32)
CREATININE: 0.52 mg/dL (ref 0.50–1.10)
Chloride: 104 mEq/L (ref 96–112)
Glucose, Bld: 92 mg/dL (ref 70–99)
Potassium: 4.3 mEq/L (ref 3.5–5.3)
SODIUM: 141 meq/L (ref 135–145)

## 2014-05-23 NOTE — Patient Instructions (Signed)
Cuidados preventivos en los adultos (Preventive Care for Adults) Un estilo de vida saludable y los cuidados preventivos pueden favorecer la salud y Olivia [name]. Las pautas de salud preventivas para las mujeres incluyen las siguientes prcticas clave:  Un examen fsico de rutina anual y Optometrist estudios preventivos es un buen modo de Chief Technology Officer su salud. Olivia [name] de Publishing rights manager preocupaciones y Civil engineer, contracting el estado de su salud, y que le realicen estudios completos.  Consulte al dentista para realizar un examen de rutina y cuidados preventivos cada 6 meses. Cepllese los dientes al [employer] veces por da y psese el hilo dental al menos una vez por da. Una buena higiene bucal evita caries y enfermedades de las encas.  La frecuencia con que debe hacerse exmenes de la vista depende de su edad, su estado de Olivia [name], su historia familiar, el uso de lentes de contacto y otros factores. Siga las recomendaciones del mdico para saber con qu frecuencia debe hacerse exmenes de la vista.  Consuma una dieta saludable. Los alimentos que contienen vegetales, las frutas, los cereales Olivia [name], los productos lcteos bajos en grasas y las protenas magras contienen nutrientes que son necesarios, sin consumir Nurse, mental health. Disminuya la ingesta de alimentos ricos en grasas slidas, azcares y sal agregadas. Consuma la cantidad de caloras adecuada para usted. Si es necesario, pdale informacin acerca de una dieta [place] a su mdico.  Realizar actividad fsica de forma regular es una de las prcticas ms importantes que puede hacer por su salud. Los adultos deben hacer al menos 150 minutos de ejercicios de intensidad moderada (cualquier actividad que aumente la frecuencia cardaca y lo haga transpirar) cada semana. Adems, la [employer] de los adultos necesita practicar ejercicios de fortalecimiento muscular dos o ms veces por semana.  Mantenga un peso saludable. El ndice de masa corporal Olivia [name]) es  una herramienta que identifica posibles problemas con Olivia [name]. Proporciona una estimacin de la grasa corporal basndose en el peso y la altura. El mdico podr determinar su Olivia [name] y ayudarlo a Scientist, forensic o Theatre manager un peso saludable. Para los adultos de 20 aos o ms:  Un Olivia [name] menor de 18,5 se considera bajo peso.  Un Olivia [name] entre 18,5 y 24,9 es normal.  Un Olivia [name], Inc. entre 25 y 29,9 se considera sobrepeso.  Un IMC de 30 o ms se considera obesidad.  Realice actividad fsica y evite ingerir grasas saturadas para mantener un nivel normal de lpidos y Research scientist (life sciences). Consuma una dieta equilibrada y saludable, e incluya variedad de frutas y Photographer. A partir de los 20 aos se deben realizar anlisis de sangre a fin de Freight forwarder nivel de lpidos y colesterol en la sangre y [place] cada 5 aos. Si los niveles de lpidos o colesterol son altos, tiene ms de 50 aos o tiene riesgo elevado de sufrir enfermedades cardacas, Designer, industrial/product controlarse con ms frecuencia. Si tiene Coca [employer]a de lpidos y colesterol, debe recibir tratamiento con medicamentos, si la dieta y el ejercicio no estn funcionando.  Si fuma, consulte con el mdico acerca de las opciones para dejar de [place]. Si no fuma, no comience.  Se recomienda realizar exmenes de deteccin de cncer de pulmn a personas adultas entre 90 y 13 aos que estn en riesgo de Horticulturist, commercial de pulmn por sus antecedentes de consumo de tabaco. Para quienes hayan fumado durante 30 aos un paquete diario, y sigan fumando o hayan dejado el hbito en algn momento en los ltimos 15 aos, se recomienda realizarse una tomografa  computarizada de baja dosis de los [employer]. Fumar durante un ao-paquete equivale a fumar un promedio de un paquete de cigarrillos diario durante un ao (por ejemplo: un paquete por da durante [place] paquetes por da durante 15 aos). Los exmenes anuales deben continuar hasta que el fumador haya dejado de  fumar durante un mnimo de 15 aos. Ya no deben Emergency planning/management officer que tengan un problema de salud que les impida recibir tratamiento para el cncer de pulmn.  Si est embarazada, no beba alcohol. Si est amamantando, beba alcohol con prudencia. Si no est embarazada y decide beber alcohol, no beba ms de Naval architect. Se considera una medida a 12onzas (383m) de cerveza, 5onzas (1476m de vino, o 1,[id number] licor.  Evite el consumo de drogas. No comparta agujas. Pida ayuda si necesita asistencia o instrucciones con respecto a abandonar el consumo de drogas.  La hipertensin arterial causa enfermedades cardacas y auSerbial riesgo de ictus. Debe controlar su presin arterial al menos cada uno o doMaxwellLa hipertensin arterial que persiste debe tratarse con medicamentos si la prdida de peso y el ejercicio no son efectivos.  Si tiene entre [id number] [id number], consulte a su mdico si debe tomar aspirina para prevenir ictus.  Los anlisis para la diabetes incluyen la toma de unTanzaniae sangre para controlar el nivel de azcar en la sangre durante el ayScotlandDebe hacerlo cada 3 aos despus de los 4541os si est dentro de su peso normal y sin factores de riesgo para la diabetes. Las pruebas deben comenzar a edades tempranas o llevarse a cabo con ms frecuencia si tiene sobrepeso y al menos un factor de riesgo para la diabetes.  Las evaluaciones para dePublic affairs consultante mama son un mtodo preventivo fundamental para las mujeres. Debe practicar la "autoconciencia de las mamas". Esto significa que deChief Technology Officerpariencia normal de sus mamas y cmo se sienten, y haElectrical engineern autoexamen de maGlass blower/designerSi detecta algn cambio, no importa cun pequeo sea, debe informarlo a su mdico. Las muConAgra Foods0 y 3072os deben hacerse un examen clnico de las mamas como parte del examen regular de saMansfieldcada uno a tres aos. Despus de los [id number] Streetdeben haCoca-ColaA partir de los  [address]deben hacerse una mamografa (radiografa de mamas) cada ao. Las mujeres con antecedentes familiares de cncer de mama deben hablar con el mdico para someterse a un estudio gentico. Las que tienen ms riesgo deben hacerse una resonancia magntica y unLavinia Sharpsodos loUpper Red Hook La evaluacin del riesgo de cncer relacionado con el gen del cncer de mama (BRCA) se recomienda a mujeres que tengan familiares con cncer relacionado con el BRCA. Los cnceres relacionados con el BRCA incluyen el cncer de mama, de ovario, de trompas y peritoneal. TeRaynelle Janamiliares con estos cnceres puede estar asociado con un mayor riesgo de cambios dainos (mutaciones) en los genes del cncer de mama BRCA1 y BRCA2. Los resultados de la evaluacin determinarn la necesidad de asesoramiento gentico y de anValley Centere BRCA1 y BRCA2.  Ya no se recomiendan los exmenes plvicos de rutina para la deteccin del cncer en las mujeres que no estn embarazadas que son consideradas sujetos de bajo riesgo de teBest boyncer de los rganos de la pelvis (ovarios, tero y vagina) y no tienen sntomas. Pregntele al mdico si un examen plvico de deteccin es adecuado para usted.  Si  ha recibido un tratamiento para Science writer cervical o una enfermedad que podra causar cncer, necesitar realizarse una prueba de Papanicolaou y controles durante al menos 27 aos de concluido el [place]. Si no se ha hecho el Papanicolaou con regularidad, debern volver a evaluarse los factores de riesgo (como tener un nuevo compaero sexual), para Teacher, adult education si debe realizarse los estudios nuevamente. Algunas mujeres sufren problemas mdicos que aumentan la probabilidad de Museum/gallery curator cncer de cuello del tero. En estos casos, el mdico podr [employer] se realicen controles y pruebas de Papanicolaou con ms frecuencia.  La prueba del VPH es un anlisis adicional que puede usarse para Film/video editor de cuello del tero. Esta prueba busca la presencia  del virus que causa los cambios en el cuello. Las clulas que se recolectan durante la prueba de Papanicolaou pueden usarse para el VPH. Se debe realizar la prueba para la deteccin del VPH a mujeres de ms de 34 aos y a Midwife de cualquier edad [employer]OK de la prueba de Papanicolaou no sean claros. Despus de los 30 aos, las mujeres deben hacerse el anlisis para el VPH con la misma frecuencia que la prueba de Papanicolaou.  El cncer colorrectal puede detectarse y con frecuencia puede prevenirse. La mayor parte de los estudios de rutina se deben Medical laboratory scientific officer a Field seismologist a Proofreader de los 13 aos y [place] 30 aos. Sin embargo, el mdico podr aconsejarle que lo haga antes, si tiene factores de riesgo para el cncer de colon. Una vez por ao, el mdico le dar un kit de prueba para Hydrologist en la materia fecal. La utilizacin de un tubo con una pequea cmara en su extremo para examinar directamente el colon (sigmoidoscopa o colonoscopa), puede detectar formas tempranas de cncer colorrectal. Hable con su mdico si tiene 25 aos, edad en la que debe comenzar a Optometrist los estudios de [place]. El examen directo del colon debe repetirse cada 5 a 10 aos, hasta los 75 aos, excepto que se encuentren formas tempranas de plipos precancerosos o pequeos bultos.  Las personas con un riesgo mayor de Insurance risk surveyor hepatitis B deben realizarse anlisis para Futures trader virus. Se considera que tiene un alto riesgo de Museum/gallery curator hepatitis B si:  Naci en un pas donde la hepatitis B es frecuente. Pregntele a su mdico qu pases son considerados de Public affairs consultant.  Sus padres nacieron en un pas de alto riesgo y usted no recibi una vacuna que lo proteja contra la hepatitis B (vacuna contra la hepatitis B).  [place].  [place] agujas para inyectarse drogas.  Vive o tiene sexo con alguien que tiene hepatitis B.  Recibe tratamiento de hemodilisis.  Toma ciertos medicamentos para Emergency planning/management officer, trasplante de rganos y afecciones autoinmunes.  Se recomienda realizar un anlisis de sangre para Hydrographic surveyor hepatitis C a todas las personas nacidas entre 1945 y 1965, y a todo aquel que sepa que tiene riesgo de haber contrado esta enfermedad.  Practique el sexo seguro. Use condones y evite las prcticas sexuales riesgosas para disminuir el contagio de enfermedades de transmisin sexual (ETS). Algunas ETS son la gonorrea, clamidia, sfilis, tricomoniasis, herpes, VPH y el virus de inmunodeficiencia humana (VIH). El herpes, el VIH y [place] VPH son enfermedades virales que no tienen [place]. Pueden producir discapacidad, cncer y [employer].  Debe realizarse pruebas de deteccin de enfermedades de transmisin sexual (ETS), incluidas gonorrea y clamidia si:  Es sexualmente activa y es menor de 24aos.  Es Chiropractor  de 28aos, y Investment banker, operational informa que corre riesgo de tener este tipo de [place].  La actividad sexual ha cambiado desde que le hicieron la ltima prueba de deteccin y tiene un riesgo mayor de Best boy clamidia o Radio broadcast assistant. Pregntele al mdico si usted tiene riesgo.  Si tiene riesgo de infectarse por el VIH, se recomienda tomar diariamente un medicamento recetado para evitar la infeccin. Esto se conoce como profilaxis previa a la exposicin. Se considera que est en riesgo si:  Es Dalene Seltzer heterosexual, es sexualmente [place] y tiene ms riesgo de Museum/gallery curator una infeccin por VIH.  Se inyecta drogas.  Es sexualmente activo con una pareja que tiene VIH.  Consulte a su mdico para saber si tiene un alto riesgo de infectarse por el VIH. Si opta por comenzar la profilaxis previa a la exposicin, primero debe realizarse anlisis de deteccin del VIH. Luego, le harn anlisis cada 38mses mientras est tomando los medicamentos para la profilaxis previa a la exposicin.  La osteoporosis es una enfermedad en la que los huesos pierden los minerales y la fuerza por el avance de la edad.  El resultado pueden ser fracturas o quebraduras graves en lNew [place] El riesgo de osteoporosis puede identificarse con uArdelia Memsprueba de densidad sea. Las mujeres de ms de 680aos y las que tengan riesgos de sufrir fracturas u osteoporosis deben discutir las opciones de control con su mdico. Consulte a su mdico si debe tomar un suplemento de calcio o de vitamina D para reducir el riesgo de osteoporosis.  La menopausia est asociada a sntomas y riesgos fsicos. Se dispone de una terapia de reemplazo hormonal para disminuir los sntomas y l[place] Consulte a su mdico para saber si la terapia de reemplazo hormonal es conveniente para usted.  Utilice pantalla solar. Aplique pantalla solar de mKerry Doryy repetida a lo largo del dTraining and development officer Resgurdese del sol cuando la sombra sea ms pequea que usted. Protjase usando mangas y pantalones largos, un sombrero de ala ancha y anteojos para el sol todo el ao, siempre que se encuentre al aRock[place]  Una vez por mes hgase un examen de la piel de todo el cuerpo usando un espejo para ver la espalda. Informe al mdico si aparecen nuevos lunares, o si nota que los que ya tena ahora tienen bordes iWade aumentaron su tamao y son ms grandes que una goma de lpiz o si su forma o color cambi.  Mantngase al da con las vacunas obligatorias .  Vacuna antigripal. Todas las personas adultas deben vacunarse cada ao.  Vacuna contra la difteria, ttanos y pAdvice worker(DT, DTPa). Las mujeres embarazadas deben recibir una dosis de la vacuna DTPa en cada embarazo. Se debe recibir la dosis independientemente de cunto tiempo haya transcurrido desde la ltima dosis. Es preferible vacunarse entre la semana 238y 370de la gestacin. Una persona adulta que no haya recibido la vacuna DTPa anteriormente o que no sabe su estado de vacunacin debe recibir una dosis. Despus de esta dosis inicial, necesitar aplicarse un refuerzo de la vacuna contra la difteria y el  ttanos (DT) cada 10 aos. Las pShip brokerque no sepan o no hayan recibido la serie de vacunacin de tres dosis contra la difteria y el ttanos deben iniciar o finalizar una serie de vacunacin primaria, que incluye la dosis contra la difteria, el ttanos y lResearch officer, trade union(DTPa). Las personas adultas deben recibir una dosis de refuerzo de DT cada 10 aos.  Vacuna contra la  varicela. Olivia [name] persona adulta sin prueba de inmunidad a la varicela debe recibir dos dosis o una segunda dosis si recibi una dosis previamente. Las embarazadas sin prueba de inmunidad deben recibir la primera dosis despus del Media planner. Esta primera dosis se debe aplicar antes del alta del centro de salud. La segunda dosis debe aplicarse entre 4 y 8 semanas posteriores a la primera dosis.  Vacuna contra el virus del Engineer, technical sales (VPH). Las ConAgra Foods 13 y 80 aos que no hayan recibido la vacuna antes deben recibir la serie de 3 dosis. La vacuna no se recomienda en mujeres embarazadas. Sin embargo, no es Chartered loss adjuster una prueba de [place] antes de recibir una dosis. Si se descubre que una mujer est embarazada despus de recibir la dosis, no se Producer, television/film/video. En ese caso, las dosis restantes deben retrasarse hasta despus del embarazo. Se recomienda la vacuna para cualquier persona inmunodeprimida hasta la edad de 26 aos si no recibi [place] o ninguna de las dosis anteriormente. Durante la serie de 3 dosis, la segunda dosis debe [employer] 4 y 8 semanas posteriores a la primera dosis. La tercera dosis debe aplicarse 24 semanas despus de la primera dosis y 16 semanas despus de la segunda dosis.  Vacuna contra el herpes zoster. Se recomienda una dosis en personas [employer] de 54 aos a menos que sufran ciertas enfermedades.  [place] contra el sarampin, la rubola y las paperas (SRP) Los adultos nacidos antes de 1957 generalmente se consideran inmunes al sarampin y las paperas. Las Consulting civil engineer en [phone] o posteriormente deben recibir una o ms dosis de la vacuna SRP, a menos que [employer] Omaha contraindicacin para la vacuna o que tengan prueba de inmunidad a las tres enfermedades. Se debe aplicar una segunda dosis de rutina de la vacuna SRP al menos 28das despus de la primera dosis a estudiantes de escuelas terciarias, trabajadores de la salud o viajeros internacionales. Las personas que recibieron la vacuna inactivada contra el sarampin o algn tipo desconocido de vacuna contra el sarampin [place] y 1967 deben recibir dos dosis de la vacuna [place]. Las Advertising copywriter recibieron la vacuna inactivada contra las paperas o algn tipo desconocido de vacuna contra las paperas antes de 1979 y tienen un alto riesgo de infectarse con la enfermedad deben considerar vacunarse con dos dosis de la vacuna SRP. En las mujeres en edad frtil, debe determinarse la inmunidad contra la rubola. Si no hay prueba de inmunidad, las mujeres que no estn embarazadas deben vacunarse. Si no hay prueba de inmunidad, las mujeres que estn embarazadas deben retrasar la vacunacin hasta despus del [place]. Los trabajadores de [employer]	[place] no vacunados que nacieron antes de 1957 y que no tienen prueba de inmunidad contra el sarampin, la rubola y las paperas o no tienen confirmacin de laboratorio de la enfermedad deben considerar vacunarse contra el sarampin y las paperas con dos dosis de la vacuna [place], y contra la rubola con una dosis de la vacuna SRP.  Vacuna antineumoccica conjugada 13 valente (PCV13). Segn indicacin mdica, una persona que no conozca su historia de vacunacin y no tenga registro de vacunas debe recibir la vacuna PCV13. Una persona de 19 aos o ms que tenga ciertas enfermedades y no se haya vacunado debe recibir una dosis de la vacuna PCV13. Despus de esta vacuna, se debe aplicar una dosis de la vacuna antineumoccica de polisacridos (PPSV23). La dosis de la vacuna PPSV23 se debe recibir al  [employer]  despus de la dosis de la vacuna PCV13. Una persona de 19 aos o ms, que tenga ciertas enfermedades y Recruitment consultant recibido una o ms dosis de la vacuna PPSV23 previamente debe recibir una dosis de la vacuna PCV13. La dosis de la vacuna BPJ12 se debe aplicar uno o ms aos despus de la dosis de la vacuna PPSV23.  Vacuna antineumoccica de polisacridos (PPSV23). Si se indica la vacuna PCV13, primero debe recibir la vacuna PCV13. Todas las personas de 65 aos o mayores deben recibir la vacuna. Una Network engineer de 38 aos que tenga ciertas enfermedades se Teacher, English as a foreign language. Cleora Fleet persona que viva en un hogar de Mining engineer o en un centro de atencin durante mucho tiempo se debe vacunar. Un fumador adulto se Teacher, English as a foreign language. Las personas inmunodeprimidas o con otras enfermedades deben recibir ambas vacunas, PCV13 y PPSV23. Las personas infectadas con el virus de la inmunodeficiencia humana (VIH) deben recibir la vacuna lo antes posible despus del diagnstico. Se debe evitar la vacunacin durante tratamientos de quimioterapia y radioterapia. El uso de rutina de la vacuna PPSV23 no est recomendado para Teacher, early years/pre, personas nativas de [place] o [employer] de 65aos, salvo que tengan ciertas enfermedades que requieran la vacuna. Segn indicacin mdica, las personas que no conozcan su historia de vacunacin y no tengan registros de [place], deben recibir la vacuna PPSV23. Se recomienda una nica revacunacin 5 aos despus de recibir la primera dosis de PPSV23 para personas de 19 a 20 aos con insuficiencia renal crnica, sndrome nefrtico, asplenia o inmunodepresin. Las [place] [employer] recibieron de una a dos dosis de PPSV23 antes de los 65 aos deben recibir otra dosis de [place] a los 65 aos de edad o posteriormente si pasaron cinco aos, como mnimo, desde la dosis anterior. Las dosis de PPSV23 no son necesarias para personas que ya recibieron la vacuna a los 38 aos o posteriormente.  Vacuna  antimeningoccica. Los adultos con asplenia o con persistentes deficiencias de componentes terminales del complemento deben recibir dos dosis de la vacuna antimeningoccica conjugada tetravalente (MenACWY-D). Las dosis se deben Midwife con un mnimo de 2 meses de diferencia. Deben vacunarse los microbilogos que trabajan con ciertas bacterias meningoccicas, reclutas militares y personas que viajan o viven en pases con una alta tasa de meningitis. Los estudiantes universitarios de Tourist information centre manager la edad de 21 que vivan en una residencia estudiantil deben recibir una dosis si no se aplicaron la vacuna cuando cumplieron o despus de cumplir 16 aos. Las personas que sufren ciertas enfermedades de alto riesgo deben aplicarse una o ms dosis.  Vacuna contra la hepatitis A. Las Advertising copywriter deseen estar protegidas contra esta enfermedad, que sufren ciertas enfermedades de alto riesgo, que trabajan con animales infectados con hepatitis A, que trabajan en los laboratorios de investigacin de hepatitis A, o que viajan o trabajan en pases con una alta tasa de hepatitis A deben recibir la vacuna. Los personas que no fueron vacunadas previamente y Deno Etienne a tener un contacto cercano con una persona adoptada fuera del pas, deben recibir la vacuna durante los primeros [address] despus de su llegada a los [place] desde un pas con una alta tasa de hepatitis A.  Vacuna contra la hepatitis B. Las [place] [employer] deseen estar protegidas contra esta enfermedad, que sufren ciertas enfermedades de alto riesgo, que puedan estar expuestas a sangre u otros fluidos corporales infecciosos, que tienen contactos familiares o parejas sexuales con hepatitis B positivo, que sean clientes o trabajadores de ciertos centros de [place],  o que viajan o trabajan en pases con una alta tasa de hepatitis B deben recibir la vacuna.  Vacuna antihaemophilus influenzae tipo B (Hib). Una persona no vacunada previamente, que sufra de asplenia o  de anemia drepanoctica, o que tenga una esplenectoma programada debe recibir una dosis de la vacuna Hib. Independientemente de la vacunacin previa, un paciente trasplantado con clulas madre hematopoyticas debe recibir Olivia [name] serie de tres dosis, de 6 a 12 meses despus del trasplante exitoso. La vacuna Hib no est recomendada para personas adultas infectadas con VIH. Controles preventivos - Olivia [name] Entre 19 y 16aos  Control de la presin arterial.**/Cada uno a [employer].  Control de lpidos y colesterol.**Olivia [name] cinco aos a partir de los 20 aos.  Examen clnico de Olivia & Olivia.**Olivia [name] 3 aos en las [employer] Financial 20 y los 83 aos.  Evaluacin del riesgo de cncer relacionado con el BRCA.**/Para mujeres que tienen familiares con cncer relacionado con el BRCA (cncer de mama, de ovario, de trompas y peritoneal).  Prueba de Papanicolaou.**Olivia [name] dos [employer] 21 y los 42 aos. Cada tres aos a Proofreader de los 60 aos y [place] 44 o 34 aos, con una historia de tres pruebas de Papanicolaou normales consecutivas.  Pruebas de deteccin de VPH.**/Cada tres aos, a partir de los 57 aos y [place] 21 o 25 aos, con una historia de tres pruebas de Papanicolaou normales consecutivas.  Anlisis de sangre para la hepatitis C.**/Para toda persona con riesgos conocidos de hepatitis C.  Autoexamen de piel /todos los meses  [place] antigripal. Olivia [name] los aos  Vacuna contra la difteria, ttanos y Advice worker (DTPa, DT).**/Consulte a su mdico. Las mujeres embarazadas deben recibir una dosis de la vacuna DTPa en cada embarazo. Una dosis de DT cada 10 aos.  Vacuna contra la varicela.**/Consulte a su mdico. Las embarazadas sin prueba de inmunidad deben recibir la primera dosis despus del Media planner.  Vacuna contra el virus del Engineer, technical sales (VPH). /3 dosis en el curso de 6 meses, si tiene 51 aos o menos. La vacuna no se recomienda en mujeres embarazadas. Sin embargo, no es Orthoptist una prueba de [place] antes de recibir una dosis.  Vacuna contra el sarampin, la rubola y las paperas ([place]).Marland KitchenEarleen [name] aplicarse al menos una dosis de SRP si ha nacido en 1957 o despus. Podra tambin necesitar una 2.da dosis. En las mujeres en edad frtil, debe determinarse la inmunidad contra la rubola. Si no hay prueba de inmunidad, las mujeres que no estn embarazadas deben vacunarse. Si no hay prueba de inmunidad, las mujeres que estn embarazadas deben retrasar la vacunacin hasta despus del [place].  Vacuna antineumoccica conjugada 13 valente (PCV13).Marland KitchenCecille [name] a su mdico.  Vacuna antineumoccica de polisacridos (PPSV23).**/De una a dos dosis si es fumador o si sufre Actuary.  Vacuna antimeningoccica.**/Si tiene entre 30 y 42 aos y es estudiante universitario de Editor, commissioning que vive en una residencia estudiantil o tiene alguna enfermedad grave, debe recibir Olivia [name] dosis de esta vacuna. Podra tambin necesitar dosis de refuerzo.  Vacuna contra la hepatitis A.**/Consulte a su mdico.  Vacuna contra la hepatitis B.**/Consulte a su mdico.  Vacuna antihaemophilus influenzae tipo B (Hib).**/Consulte a su mdico. Entre 40 y 60aos  Control de la presin arterial.**/Cada uno a International aid/development worker.  Control de lpidos y colesterol.**Olivia [name] cinco aos a partir de los 20 aos.  Pruebas de deteccin de cncer de pulmn. /Todos los aos si tiene entre 18 y 9 aos, y si ha fumado [id number] 87  aos un paquete diario y sigue fumando o dej el hbito en algn momento en los ltimos 15 aos. Los estudios de Pharmacologist se interrumpen cuando haya dejado de fumar durante al menos 15aos o si tiene un problema de salud que le impida recibir tratamiento para Science writer de pulmn.  Examen clnico de Olivia & Olivia.**/Todos los aos despus de los 40 aos.  Evaluacin del riesgo de cncer relacionado con el BRCA.**/Para mujeres que tienen familiares con cncer relacionado con el BRCA  (cncer de mama, de ovario, de trompas y peritoneal).  Mamografa.**/Una vez por ao a partir de los 13 aos, siempre que tenga buena salud. Consulte a su mdico.  Prueba de Papanicolaou.Marland KitchenCarma [name] tres aos despus de los 43 aos y [place] 8 o 36 aos, con una historia de tres pruebas de Papanicolaou normales consecutivas.  Pruebas de deteccin de VPH.**/Cada tres aos, a partir de los 33 aos y [place] 67 o 44 aos, con una historia de tres pruebas de Papanicolaou normales consecutivas.  Prueba de sangre oculta en materia fecal. Olivia [name] ao a partir de los 50 [employer] 75 aos. No tendr que hacerlo si se realiza una colonoscopa cada 10 aos.  Colonoscopa o sigmoidoscopa flexible.Marland KitchenCarma [name] 5 aos para la sigmoidoscopa flexible o cada 10 aos para la colonoscopa, a Proofreader de los 36 aos de edad y [place] 72 aos.  Anlisis de sangre para la hepatitis C.**/Para todas las personas nacidas entre 1945 y 1965, y a todo aquel que tenga un riesgo conocido de haber contrado esta enfermedad.  Autoexamen de piel /todos los meses  [place] antigripal. /Todos los aos  Vacuna contra la difteria, ttanos y Advice worker (DTPa/DT).**/Consulte a su mdico. Las mujeres embarazadas deben recibir una dosis de la vacuna DTPa en cada embarazo. Una dosis de DT cada 10 aos.  Vacuna contra la varicela.**/Consulte a su mdico. Las embarazadas sin prueba de inmunidad deben recibir la primera dosis despus del Media planner.  Vacuna contra el herpes zoster.Marland KitchenArdelia [name] dosis para adultos de 60 aos o ms.  Vacuna contra el sarampin, la rubola y las paperas ([place]).Marland KitchenEarleen [name] aplicarse al menos una dosis de SRP si ha nacido en 1957 o despus. Podra tambin necesitar una 2.da dosis. En las mujeres en edad frtil, debe determinarse la inmunidad contra la rubola. Si no hay prueba de inmunidad, las mujeres que no estn embarazadas deben vacunarse. Si no hay prueba de inmunidad, las mujeres que estn embarazadas deben  retrasar la vacunacin hasta despus del [place].  Vacuna antineumoccica conjugada 13 valente (PCV13).Marland KitchenCecille [name] a su mdico.  Vacuna antineumoccica de polisacridos (PPSV23).**/De una a dos dosis si es fumador o si sufre Actuary.  Vacuna antimeningoccica.Marland KitchenCecille [name] a su mdico.  Investment banker, operational contra la hepatitis A.**/Consulte a su mdico.  Vacuna contra la hepatitis B.**/Consulte a su mdico.  Vacuna antihaemophilus influenzae tipo B (Hib).**/Consulte a su mdico. Ms de 18 aos  Control de la presin arterial.**/Cada uno a International aid/development worker.  Control de lpidos y colesterol.**Olivia [name] cinco aos a partir de los 20 aos.  Pruebas de deteccin de cncer de pulmn. /Todos los aos si tiene entre 58 y 45 aos, y si ha fumado durante 74 aos un paquete diario y sigue fumando o dej el hbito en algn momento en los ltimos 15 aos. Los estudios de Pharmacologist se interrumpen cuando haya dejado de fumar durante al menos 15aos o si tiene un problema de salud que le impida recibir tratamiento para Science writer de pulmn.  Examen clnico de Olivia & Olivia.Marland KitchenSan [name]  los aos despus de los 79 aos.  Evaluacin del riesgo de cncer relacionado con el BRCA.**/Para mujeres que tienen familiares con cncer relacionado con el BRCA (cncer de mama, de ovario, de trompas y peritoneal).  Mamografa.**/Una vez por ao a partir de los 38 aos, siempre que tenga buena salud. Consulte a su mdico.  Prueba de Papanicolaou.**Olivia [name] tres aos despus de los 56 aos y [place] 65 o 13 aos, con tres pruebas de Papanicolaou normales consecutivas. Las pruebas pueden interrumpirse [employer] 38 y los 37 aos, si tiene tres pruebas de Papanicolaou normales consecutivas y ninguna prueba de Papanicolaou ni de VPH anormal en los ltimos 10 aos.  Pruebas de deteccin de VPH.**/Cada tres aos, a partir de los 29 aos y [place] Olivia 35 o 76 aos, con una historia de tres pruebas de Papanicolaou normales consecutivas. Las  pruebas pueden interrumpirse [employer] 92 y los 63 aos, si tiene tres pruebas de Papanicolaou normales consecutivas y ninguna prueba de Papanicolaou ni de VPH anormal en los ltimos 10 aos.  Prueba de sangre oculta en materia fecal. Olivia [name] ao a partir de los 50 [employer] 75 aos. No tendr que hacerlo si se realiza una colonoscopa cada 10 aos.  Colonoscopa o sigmoidoscopa flexible.Marland KitchenCarma [name] 5 aos para la sigmoidoscopa flexible o cada 10 aos para la colonoscopa, a Proofreader de los 26 aos de edad y [place] 8 aos.  Anlisis de sangre para la hepatitis C.**/Para todas las personas nacidas entre 1945 y 1965, y a todo aquel que tenga un riesgo conocido de haber contrado esta enfermedad.  Pruebas de deteccin de osteoporosis.Marland KitchenWesley Blas nica vez en mujeres de ms de 4 aos que tengan riesgo de fracturas u osteoporosis.  Autoexamen de piel /todos los meses  [place] antigripal. Olivia [name] los aos  Vacuna contra la difteria, ttanos y Advice worker (DTPa/DT).**/Una dosis de DT cada 10 aos.  Vacuna contra la varicela.**Olivia [name] a su mdico.  Vacuna contra el herpes zoster.Marland KitchenArdelia [name] dosis para adultos de 60 aos o ms.  Vacuna antineumoccica conjugada 13 valente (PCV13).Marland KitchenCecille [name] a su mdico.  Vacuna antineumoccica de polisacridos (PPSV23).Marland KitchenArdelia [name] dosis para todos los adultos de 65 aos o ms.  Vacuna antimeningoccica.Marland KitchenCecille [name] a su mdico.  Investment banker, operational contra la hepatitis A.**/Consulte a su mdico.  Vacuna contra la hepatitis B.**/Consulte a su mdico.  Vacuna antihaemophilus influenzae tipo B (Hib).**/Consulte a su mdico. ** Los antecedentes familiares y personales de riesgos y enfermedades pueden [place]ar[place] manager las recomendaciones del mdico. Document Released: 01/30/2005 Document Revised: 04/27/2013 ExitCare Patient Information 2015 [place], [place]. This information is not intended to replace advice given to you by your health care provider. Make sure you discuss any questions you  have with your health care provider.

## 2014-05-24 ENCOUNTER — Encounter: Payer: Self-pay | Admitting: Family Medicine

## 2014-05-24 NOTE — Assessment & Plan Note (Signed)
Patient continues to have pain from varicose veins -referral to vein specialist made

## 2014-05-24 NOTE — Assessment & Plan Note (Signed)
Patient presents for preventative health care visit -up to date on immunizations -up to date on Pap Smear -Due for mammogram (patient receives through community program), she will schedule -Counseled on diet and exercise -Needs Mirena changed (see specific problem)

## 2014-05-24 NOTE — Assessment & Plan Note (Signed)
BMI 32 -counseled on diet and exercise -referral to nutrition

## 2014-05-24 NOTE — Progress Notes (Signed)
49 y.o. year old female presents for well woman/preventative visit. Spanish Interpretor Present.   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 Marital Status: Married    Spouse Name: N/A    Number of Children: N/A  . Years of Education: N/A   Social History Main Topics  . Smoking status: Never Smoker   . Smokeless tobacco: None  . Alcohol Use: No     Comment: occasionally  . Drug Use: None  . Sexual Activity: Yes    Birth Control/ Protection: IUD   Other Topics Concern  . None   Social History Narrative   Native of [place], [place]   Husband, Kenefic, alcoholic   3 children, Oldest son in [place]   Grandchildren, [place]         Not a smoker  Immunization:  Tdap/TD: 2014  Influenza: 2015  Pneumococcal: Not a candidate  Herpes Zoster: Note a candidate  Cancer Screening:  Pap Smear: 06/2012 though community program  Mammogram: 2014 though community program   Colonoscopy: Too young, no family history   Dexa: Not a candidate  Physical Exam: VITALS: reviewed GEN: pleasant Hispanic female, NAD HEENT: Normocephalic, PERRL, EOMI, no scleral icterus, nasal septum midline, MMM, uvula midline, no cervical adenopathy, no thyromegaly  CARDIAC: RRR, S1 and S2 present, no murmurs, no heaves/thrills RESP: CTAB, normal effort BREAST:Exam performed in the presence of a chaperone. No breast mass, no nipple discharge, no axillary adenopathy ABD: soft, no tenderness, normal bowel sounds GU/GYN: Deferred  EXT: no edema, 2+ radial and DP pulses SKIN: no rash, multiple varicose veins  ASSESSMENT & PLAN: 49 y.o. female presents for annual well woman/preventative exam.  Please see problem specific assessment and plan.

## 2014-05-24 NOTE — Assessment & Plan Note (Signed)
Patient past due for Mirena removal -patient to apply for Mirena scholarship -patient to return for pelvic exam/Mirena removal/replace Mirena

## 2014-05-30 ENCOUNTER — Telehealth: Payer: Self-pay | Admitting: Family Medicine

## 2014-05-30 NOTE — Telephone Encounter (Signed)
Spoke to patient via telephone, reminded patient that since Mirena is over 49 years old it my not be functioning, told her to use condoms until we can have it changed in office, patient has not been able to complete the form for Mirena scholarship, interested in other forms of BC, will schedule patient for Mirena removal and discuss BC at that time  Note to nursing staff - please call patient and schedule Mirena removal, thanks

## 2014-05-30 NOTE — Telephone Encounter (Signed)
Patient scheduled for 2/2

## 2014-06-07 ENCOUNTER — Ambulatory Visit: Payer: Self-pay | Admitting: Family Medicine

## 2014-06-14 ENCOUNTER — Other Ambulatory Visit (HOSPITAL_COMMUNITY)
Admission: RE | Admit: 2014-06-14 | Discharge: 2014-06-14 | Disposition: A | Discharge status: Home / Self Care | Payer: Self-pay | Source: Ambulatory Visit | Attending: Family Medicine | Admitting: Family Medicine

## 2014-06-14 ENCOUNTER — Encounter: Payer: Self-pay | Admitting: Family Medicine

## 2014-06-14 ENCOUNTER — Ambulatory Visit (INDEPENDENT_AMBULATORY_CARE_PROVIDER_SITE_OTHER): Payer: Self-pay | Admitting: Family Medicine

## 2014-06-14 VITALS — BP 136/85 | HR 73 | Temp 98.0°F | Ht 59.0 in | Wt 161.0 lb

## 2014-06-14 DIAGNOSIS — Z1151 Encounter for screening for human papillomavirus (HPV): Secondary | ICD-10-CM | POA: Insufficient documentation

## 2014-06-14 DIAGNOSIS — Z01419 Encounter for gynecological examination (general) (routine) without abnormal findings: Secondary | ICD-10-CM | POA: Insufficient documentation

## 2014-06-14 DIAGNOSIS — R0981 Nasal congestion: Secondary | ICD-10-CM | POA: Insufficient documentation

## 2014-06-14 DIAGNOSIS — Z30431 Encounter for routine checking of intrauterine contraceptive device: Secondary | ICD-10-CM

## 2014-06-14 DIAGNOSIS — Z309 Encounter for contraceptive management, unspecified: Secondary | ICD-10-CM

## 2014-06-14 DIAGNOSIS — Z124 Encounter for screening for malignant neoplasm of cervix: Secondary | ICD-10-CM

## 2014-06-14 MED ORDER — FLUTICASONE PROPIONATE 50 MCG/ACT NA SUSP: 2.0000 | Freq: Every day | NASAL | Status: DC

## 2014-06-14 NOTE — Progress Notes (Signed)
   Subjective:    Patient ID: Olivia [name], female    DOB: August 26, 1965, 49 y.o.   MRN: [id number]  HPI 49 year old female presents for IUD removal.  Patient has had her current IUD for greater than 5 years. She reports infrequent sexual activity with her husband. Her last menstrual period was approximately 5 months ago. She's had approximately 1. In the past 12 months. She denies intermittent spotting.  Patient also reports a one-month history of rhinorrhea, nasal congestion, and frontal headache. She does have associated cough. No fevers.   Review of Systems  Constitutional: Negative for fever and chills.  Respiratory: Positive for cough. Negative for shortness of breath.   Gastrointestinal: Negative for nausea, vomiting and diarrhea.       Objective:   Physical Exam Vitals: Reviewed Gen.: Pleasant Hispanic female, no acute distress HEENT: Normocephalic, pupils equal in size bilaterally, no scleral icterus, mild rhinorrhea present, moist mucous members, small amount of postnasal drip and pharyngeal erythema present, no thyromegaly, no cervical adenopathy Cardiac: Regular rate and rhythm, S1 and S2 present, no murmurs Respiratory: Clear to auscultation bilaterally, cough present during exam, normal work of breathing GU: Chaperone present, normal external female genitalia, speculum exam identified normal appearing cervix with IUD strings present, ring forceps were used to remove the IUD, Pap smear obtained, bimanual examination identified no cervical motion tenderness and no gross mass      Assessment & Plan:  Please see problem specific assessment and plan.

## 2014-06-14 NOTE — Patient Instructions (Signed)
Your IUD was removed today.   Please continue to use condoms for birth control. If you have return of your menses please let me know if you would like to start birth control pills.  Start Flonase for nasal congestion.  Dr. Ree Kida will call you with your pap smear results.

## 2014-06-14 NOTE — Assessment & Plan Note (Signed)
49 year old female presents for contraception management. -Mirena IUD removed -As patient is showing perimenopausal symptoms addition of new contraception was deferred, patient is to continue to use condoms

## 2014-06-14 NOTE — Assessment & Plan Note (Signed)
IUD removed today.

## 2014-06-14 NOTE — Assessment & Plan Note (Signed)
Patient presents with nasal congestion consistent with viral URI. -Patient encouraged to continue over-the-counter medications as well as start Flonase daily

## 2014-06-16 LAB — CYTOLOGY - PAP

## 2014-06-17 ENCOUNTER — Encounter: Payer: Self-pay | Admitting: Family Medicine

## 2014-09-05 ENCOUNTER — Ambulatory Visit: Payer: Self-pay

## 2016-07-31 ENCOUNTER — Other Ambulatory Visit: Payer: Self-pay | Admitting: Obstetrics and Gynecology

## 2016-07-31 DIAGNOSIS — Z1231 Encounter for screening mammogram for malignant neoplasm of breast: Secondary | ICD-10-CM

## 2016-08-01 ENCOUNTER — Other Ambulatory Visit (HOSPITAL_COMMUNITY): Payer: Self-pay | Admitting: *Deleted

## 2016-08-01 ENCOUNTER — Other Ambulatory Visit (HOSPITAL_COMMUNITY): Payer: Self-pay | Admitting: Obstetrics and Gynecology

## 2016-08-01 ENCOUNTER — Ambulatory Visit: Payer: No Typology Code available for payment source

## 2016-08-01 ENCOUNTER — Ambulatory Visit
Admission: RE | Admit: 2016-08-01 | Discharge: 2016-08-01 | Disposition: A | Discharge status: Home / Self Care | Payer: No Typology Code available for payment source | Source: Ambulatory Visit | Attending: Obstetrics and Gynecology | Admitting: Obstetrics and Gynecology

## 2016-08-01 ENCOUNTER — Ambulatory Visit (HOSPITAL_COMMUNITY)
Admission: RE | Admit: 2016-08-01 | Discharge: 2016-08-01 | Disposition: A | Discharge status: Home / Self Care | Payer: Self-pay | Source: Ambulatory Visit | Attending: Obstetrics and Gynecology | Admitting: Obstetrics and Gynecology

## 2016-08-01 ENCOUNTER — Encounter (HOSPITAL_COMMUNITY): Payer: Self-pay

## 2016-08-01 VITALS — BP 162/80 | Temp 98.2°F | Wt 163.2 lb

## 2016-08-01 DIAGNOSIS — R2231 Localized swelling, mass and lump, right upper limb: Secondary | ICD-10-CM

## 2016-08-01 DIAGNOSIS — Z1239 Encounter for other screening for malignant neoplasm of breast: Secondary | ICD-10-CM

## 2016-08-01 DIAGNOSIS — Z1231 Encounter for screening mammogram for malignant neoplasm of breast: Secondary | ICD-10-CM

## 2016-08-01 NOTE — Addendum Note (Signed)
Encounter addended by: Loletta Parish, RN on: 08/01/2016 10:31 AM<BR>    Actions taken: Sign clinical note

## 2016-08-01 NOTE — Addendum Note (Signed)
Encounter addended by: Loletta Parish, RN on: 08/01/2016 10:41 AM<BR>    Actions taken: Sign clinical note

## 2016-08-01 NOTE — Patient Instructions (Signed)
Explained breast self awareness with Olivia [name]. Patient did not need a Pap smear today due to last Pap smear and HPV typing was 06/14/2014. Let her know [facility]CP will cover Pap smears and HPV typing every 5 years unless has a history of abnormal Pap smears. Referred patient to the [facility] for diagnostic mammogram and possible right breast ultrasound. Appointment scheduled for Thursday, August 02, 2014 at 1000.  Olivia [name] verbalized understanding.  Olivia [name], Olivia Chaco, RN 9:19 AM

## 2016-08-01 NOTE — Progress Notes (Addendum)
Complaints of right axillary lump x one month.  Pap Smear:  Pap smear not completed today. Last Pap smear was 06/14/2014 at [facility] and normal with negative HPV. Per patient has no history of an abnormal Pap smear. Last Pap smear result is in EP[facility].  Physical exam: Breasts Breasts symmetrical. No skin abnormalities bilateral breasts. No nipple retraction bilateral breasts. No nipple discharge bilateral breasts. No lymphadenopathy. Palpated a pea lump within the right axilla. No lumps palpated bilateral breasts. No complaints of pain or tenderness on exam. Referred patient to the [facility] for diagnostic mammogram and possible right breast ultrasound. Appointment scheduled for Thursday, August 02, 2014 at 1000.        Pelvic/Bimanual No Pap smear completed today since last Pap smear and HPV typing was 06/14/2014. Pap smear not indicated per BCCCP guidelines.   Smoking History: Patient has never smoked.  Patient Navigation: Patient education provided. Access to services provided for patient through [facility] program. Spanish interpreter provided.   Colorectal Cancer Screening: Per patient has never had a colonoscopy completed. No complaints today. FIT Test given to patient to complete and return to [facility]CP.  Used Spanish interpreter [employer] from [facility].

## 2016-08-02 ENCOUNTER — Encounter (HOSPITAL_COMMUNITY): Payer: Self-pay | Admitting: *Deleted

## 2016-08-05 ENCOUNTER — Other Ambulatory Visit: Payer: Self-pay

## 2016-08-09 LAB — FECAL OCCULT BLOOD, IMMUNOCHEMICAL: FECAL OCCULT BLD: NEGATIVE

## 2016-09-02 ENCOUNTER — Telehealth (HOSPITAL_COMMUNITY): Payer: Self-pay | Admitting: *Deleted

## 2016-09-02 NOTE — Telephone Encounter (Signed)
Telephoned patient at home number and discussed negative results of Fit Test. Patient voiced understanding. Used interpreter Lavon Paganini.

## 2016-12-26 ENCOUNTER — Ambulatory Visit (INDEPENDENT_AMBULATORY_CARE_PROVIDER_SITE_OTHER): Payer: Self-pay | Admitting: Internal Medicine

## 2016-12-26 VITALS — BP 120/85 | HR 69 | Temp 98.4°F | Ht <= 58 in | Wt 162.7 lb

## 2016-12-26 DIAGNOSIS — K5900 Constipation, unspecified: Secondary | ICD-10-CM

## 2016-12-26 DIAGNOSIS — F329 Major depressive disorder, single episode, unspecified: Secondary | ICD-10-CM

## 2016-12-26 DIAGNOSIS — Z79899 Other long term (current) drug therapy: Secondary | ICD-10-CM

## 2016-12-26 DIAGNOSIS — Z630 Problems in relationship with spouse or partner: Secondary | ICD-10-CM

## 2016-12-26 DIAGNOSIS — R5383 Other fatigue: Secondary | ICD-10-CM

## 2016-12-26 LAB — GLUCOSE, CAPILLARY: Glucose-Capillary: 104 mg/dL — ABNORMAL HIGH (ref 65–99)

## 2016-12-26 LAB — POCT GLYCOSYLATED HEMOGLOBIN (HGB A1C): Hemoglobin A1C: 5.2

## 2016-12-26 NOTE — Patient Instructions (Signed)
Fue Nurse, learning disability, Sra. Laurin Coder.   Le di informacion sobre la clinica [facility] donde puede ir sin cita para consejeria.   Le vamos a llamar si los resultados de sus laboratorios de [place] no son normales.   Haga una cita de seguimiento conmigo (Dra. Santos) para 4-8 semanas.

## 2016-12-26 NOTE — Assessment & Plan Note (Signed)
Patient presenting for fatigue of 2 weeks duration. States she does not have the motivation or energy to get out of bed and has been sleeping most of the time the past 2 weeks. Reports 2 recent stressors in her life including a son who lives in [place] who is having problems (patient cannot specify) and marital issues. States she feels guilty because she can't help her son and also sad about having problems with her husband. She also endorses lack of concentration and loss of interest in her hobbies. Denies changes in appetite, but states she has gained weight. On review of chart seems like her weight is stable. However, last weight is from 2016. Denies fever, chills, chest pain, shortness of breath, abdominal pain, nausea, vomiting, intolerance to temperature and weakness. Denies recent illness. Does not take any medications. Denies alcohol, tobacco, and drug use.  Patient also endorses mild constipation which is chronic in nature. Reports having external hemorrhoids as a consequence of constipation. Denies recent changes in bowel movements, but does report intermittent red blood in stool and external hemorrhoids.   Patient's physical exam is normal today. Suspect her fatigue might be due to depression given recent life stressors. Would like to start an SSRI today (Celexa), however patient states she does not like taking medication. Therefore, recommended starting with counseling services and provided information about [facility]. We will also obtain blood work to rule out other causes of fatigue such as anemia, hypothyroidism, and infection.   Depression:  - Check CBC, TSH, vitamin B12, and HIV - Provided information about [facility] for counseling - Asked patient to follow-up with me in 4-8 weeks. Consider starting Celexa 20 mg daily if symptoms have not improved.

## 2016-12-26 NOTE — Progress Notes (Signed)
   CC: Fatigue  HPI:  [name] is a 51 y.o. female with PMH listed below who presents to clinic for fatigue for 2 weeks. Please see problem base assessment and plan for further details.  Past Medical History:  Diagnosis Date  . Anemia   . Arthritis   . GERD (gastroesophageal reflux disease)    Review of Systems:   Review of Systems  Constitutional: Positive for malaise/fatigue. Negative for chills, fever and weight loss.  Respiratory: Negative for cough, shortness of breath and wheezing.   Cardiovascular: Negative for chest pain and palpitations.  Gastrointestinal: Positive for blood in stool and constipation. Negative for abdominal pain, diarrhea, melena, nausea and vomiting.  Musculoskeletal: Negative for joint pain and myalgias.  Neurological: Positive for headaches. Negative for dizziness, sensory change, focal weakness and weakness.  Psychiatric/Behavioral: Positive for depression. Negative for memory loss, substance abuse and suicidal ideas. The patient does not have insomnia.     Physical Exam:  Vitals:   12/26/16 1538  BP: 120/85  Pulse: 69  Temp: 98.4 F (36.9 C)  TempSrc: Oral  SpO2: 97%  Weight: 162 lb 11.2 oz (73.8 kg)  Height: 4\' 9"  (1.448 m)   General: Pleasant, well-nourished, well-developed, in no acute distress HENT: NCAT, neck supple and FROM, no LAD, MMM, OP clear without exudates or erythema, nl/poor dentition  Eyes: anicteric sclera, PERRL Cardiac: regular rate and rhythm, nl S1/S2, no murmurs, rubs or gallops, no JVD noted Pulm: CTAB, no wheezes or crackles, no increased work of breathing  Abd: soft, NTND, bowel sounds present Neuro: A&Ox3, CN II-XII intact, able to move all 4 extremities, no focal deficits noted Ext: warm and well perfused, no peripheral edema   Psych: attentive, appropriate affect, answers questions appropriately   Assessment & Plan:   See Encounters Tab for problem based charting.  Patient seen with Dr.  Daryll Drown

## 2016-12-27 LAB — CBC
HEMOGLOBIN: 13.8 g/dL (ref 11.1–15.9)
Hematocrit: 41.1 % (ref 34.0–46.6)
MCH: 29.8 pg (ref 26.6–33.0)
MCHC: 33.6 g/dL (ref 31.5–35.7)
MCV: 89 fL (ref 79–97)
Platelets: 268 10*3/uL (ref 150–379)
RBC: 4.63 x10E6/uL (ref 3.77–5.28)
RDW: 13 % (ref 12.3–15.4)
WBC: 6.1 10*3/uL (ref 3.4–10.8)

## 2016-12-27 LAB — BMP8+ANION GAP
Anion Gap: 11 mmol/L (ref 10.0–18.0)
BUN/Creatinine Ratio: 26 — ABNORMAL HIGH (ref 9–23)
BUN: 14 mg/dL (ref 6–24)
CALCIUM: 9.5 mg/dL (ref 8.7–10.2)
CO2: 22 mmol/L (ref 20–29)
Chloride: 104 mmol/L (ref 96–106)
Creatinine, Ser: 0.53 mg/dL — ABNORMAL LOW (ref 0.57–1.00)
GFR calc non Af Amer: 110 mL/min/{1.73_m2} (ref >59)
GFR, EST AFRICAN AMERICAN: 127 mL/min/{1.73_m2} (ref >59)
Glucose: 106 mg/dL — ABNORMAL HIGH (ref 65–99)
POTASSIUM: 3.9 mmol/L (ref 3.5–5.2)
Sodium: 137 mmol/L (ref 134–144)

## 2016-12-27 LAB — HIV ANTIBODY (ROUTINE TESTING W REFLEX): HIV SCREEN 4TH GENERATION: NONREACTIVE

## 2016-12-27 LAB — VITAMIN B12: Vitamin B-12: >2000 pg/mL — ABNORMAL HIGH (ref 232–1245)

## 2016-12-27 LAB — TSH: TSH: 1.25 u[IU]/mL (ref 0.450–4.500)

## 2017-01-01 NOTE — Progress Notes (Signed)
Internal Medicine Clinic Attending  I saw and evaluated the patient.  I personally confirmed the key portions of the history and exam documented by Dr. Santos-Sanchez and I reviewed pertinent patient test results.  The assessment, diagnosis, and plan were formulated together and I agree with the documentation in the resident's note. 

## 2017-02-14 ENCOUNTER — Encounter: Payer: Self-pay | Admitting: Internal Medicine

## 2017-02-14 ENCOUNTER — Ambulatory Visit (INDEPENDENT_AMBULATORY_CARE_PROVIDER_SITE_OTHER): Payer: Self-pay | Admitting: Internal Medicine

## 2017-02-14 VITALS — BP 112/70 | HR 72 | Temp 98.0°F | Wt 161.3 lb

## 2017-02-14 DIAGNOSIS — Z0000 Encounter for general adult medical examination without abnormal findings: Secondary | ICD-10-CM

## 2017-02-14 DIAGNOSIS — F320 Major depressive disorder, single episode, mild: Secondary | ICD-10-CM

## 2017-02-14 DIAGNOSIS — Z8659 Personal history of other mental and behavioral disorders: Secondary | ICD-10-CM | POA: Insufficient documentation

## 2017-02-14 NOTE — Progress Notes (Signed)
   CC: Depression follow-up  HPI:  [name] is a 51 y.o. female with history as described below who presents to clinic for depression follow-up. Please see problem based assessment and plan for further details.  Past Medical History:  Diagnosis Date  . Anemia   . Arthritis   . GERD (gastroesophageal reflux disease)    Review of Systems:   Review of Systems  Constitutional: Negative for chills, fever, malaise/fatigue and weight loss.  Respiratory: Negative for shortness of breath.   Cardiovascular: Negative for chest pain and palpitations.  Skin: Rash: .myphysicalexam.  Neurological: Positive for headaches. Negative for dizziness, focal weakness and weakness.  Psychiatric/Behavioral: Positive for depression. Negative for hallucinations, memory loss, substance abuse and suicidal ideas. The patient does not have insomnia.   All other systems reviewed and are negative.     Physical Exam:  Vitals:   02/14/17 1529  BP: 112/70  Pulse: 72  Temp: 98 F (36.7 C)  TempSrc: Oral  SpO2: 98%  Weight: 161 lb 4.8 oz (73.2 kg)   General:Very pleasant female, well-developed, well-nourished, in no acute distress Cardiac: regular rate and rhythm, nl S1/S2, no murmurs, rubs or gallops  Pulm: CTAB, no wheezes or crackles, no increased work of breathing  Abd: soft, NTND, bowel sounds present Neuro: A&Ox3, able to move all 4 extremities, no focal deficits noted Ext: warm and well perfused, no peripheral edema  Psych: attentive, appropriate affect, answers questions appropriately, speech is normal, denies visual and auditory hallucinations, denies SI/HI     Assessment & Plan:   See Encounters Tab for problem based charting.  Patient seen with Dr. Angelia Mould

## 2017-02-14 NOTE — Assessment & Plan Note (Signed)
-   Declined flu shot. Advised she can come back to clinic at any time if she changes her mind.  - iFOBT 08/2016 and mammogram 07/2016 - Due for Pap smear on 06/2019

## 2017-02-14 NOTE — Patient Instructions (Signed)
Fue un placer verla de nuevo [name].   Me alegra mucho que se este sintiendo mejor. Continue haciendo ejercicios y caminando.   Vuelva en 6 meses - 1 ao. Puede venir antes de esa fecha si necesita atencion medica tambien.
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
## 2017-02-17 NOTE — Progress Notes (Signed)
Internal Medicine Clinic Attending  I saw and evaluated the patient.  I personally confirmed the key portions of the history and exam documented by Dr. Santos-Sanchez and I reviewed pertinent patient test results.  The assessment, diagnosis, and plan were formulated together and I agree with the documentation in the resident's note. 

## 2017-03-14 ENCOUNTER — Encounter (HOSPITAL_COMMUNITY): Payer: Self-pay

## 2017-06-30 ENCOUNTER — Other Ambulatory Visit: Payer: Self-pay

## 2017-07-07 LAB — CYTOLOGY - PAP: DIAGNOSIS: NEGATIVE

## 2017-08-08 LAB — GLUCOSE, POCT (MANUAL RESULT ENTRY): POC GLUCOSE: 105 mg/dL — AB (ref 70–99)

## 2017-08-22 ENCOUNTER — Emergency Department (HOSPITAL_COMMUNITY): Payer: Self-pay

## 2017-08-22 ENCOUNTER — Encounter (HOSPITAL_COMMUNITY): Payer: Self-pay | Admitting: Emergency Medicine

## 2017-08-22 ENCOUNTER — Emergency Department (HOSPITAL_COMMUNITY)
Admission: EM | Admit: 2017-08-22 | Discharge: 2017-08-22 | Disposition: A | Discharge status: Home / Self Care | Payer: Self-pay | Attending: Emergency Medicine | Admitting: Emergency Medicine

## 2017-08-22 ENCOUNTER — Other Ambulatory Visit: Payer: Self-pay

## 2017-08-22 DIAGNOSIS — N200 Calculus of kidney: Secondary | ICD-10-CM | POA: Insufficient documentation

## 2017-08-22 LAB — COMPREHENSIVE METABOLIC PANEL
ALBUMIN: 3.9 g/dL (ref 3.5–5.0)
ALK PHOS: 79 U/L (ref 38–126)
ALT: 27 U/L (ref 14–54)
ANION GAP: 10 (ref 5–15)
AST: 28 U/L (ref 15–41)
BILIRUBIN TOTAL: 0.8 mg/dL (ref 0.3–1.2)
BUN: 19 mg/dL (ref 6–20)
CALCIUM: 9.1 mg/dL (ref 8.9–10.3)
CO2: 20 mmol/L — ABNORMAL LOW (ref 22–32)
Chloride: 107 mmol/L (ref 101–111)
Creatinine, Ser: 0.65 mg/dL (ref 0.44–1.00)
GFR calc Af Amer: >60 mL/min (ref >60)
GLUCOSE: 143 mg/dL — AB (ref 65–99)
Potassium: 3.8 mmol/L (ref 3.5–5.1)
Sodium: 137 mmol/L (ref 135–145)
TOTAL PROTEIN: 7.8 g/dL (ref 6.5–8.1)

## 2017-08-22 LAB — URINALYSIS, ROUTINE W REFLEX MICROSCOPIC
Bacteria, UA: NONE SEEN
Bilirubin Urine: NEGATIVE
Glucose, UA: NEGATIVE mg/dL
Ketones, ur: NEGATIVE mg/dL
Leukocytes, UA: NEGATIVE
Nitrite: NEGATIVE
Protein, ur: NEGATIVE mg/dL
Specific Gravity, Urine: 1.02 (ref 1.005–1.030)
pH: 6 (ref 5.0–8.0)

## 2017-08-22 LAB — CBC
HCT: 41.2 % (ref 36.0–46.0)
Hemoglobin: 14 g/dL (ref 12.0–15.0)
MCH: 30.2 pg (ref 26.0–34.0)
MCHC: 34 g/dL (ref 30.0–36.0)
MCV: 88.8 fL (ref 78.0–100.0)
PLATELETS: 283 10*3/uL (ref 150–400)
RBC: 4.64 MIL/uL (ref 3.87–5.11)
RDW: 12.6 % (ref 11.5–15.5)
WBC: 8.1 10*3/uL (ref 4.0–10.5)

## 2017-08-22 LAB — LIPASE, BLOOD: Lipase: 31 U/L (ref 11–51)

## 2017-08-22 LAB — I-STAT BETA HCG BLOOD, ED (MC, WL, AP ONLY): I-stat hCG, quantitative: <5 m[IU]/mL (ref <5)

## 2017-08-22 MED ORDER — MORPHINE SULFATE (PF) 4 MG/ML IV SOLN
4.0000 mg | Freq: Once | INTRAVENOUS | Status: AC
  Administered 2017-08-22: 4 mg via INTRAVENOUS
  Filled 2017-08-22: qty 1

## 2017-08-22 MED ORDER — SODIUM CHLORIDE 0.9 % IV BOLUS
1000.0000 mL | Freq: Once | INTRAVENOUS | Status: AC
  Administered 2017-08-22: 1000 mL via INTRAVENOUS

## 2017-08-22 MED ORDER — ONDANSETRON 4 MG PO TBDP: 4.0000 mg | ORAL_TABLET | Freq: Three times a day (TID) | ORAL | 0 refills | Status: DC | PRN

## 2017-08-22 MED ORDER — ONDANSETRON 4 MG PO TBDP
4.0000 mg | ORAL_TABLET | Freq: Once | ORAL | Status: AC | PRN
  Administered 2017-08-22: 4 mg via ORAL
  Filled 2017-08-22: qty 1

## 2017-08-22 MED ORDER — KETOROLAC TROMETHAMINE 15 MG/ML IJ SOLN
15.0000 mg | Freq: Once | INTRAMUSCULAR | Status: AC
  Administered 2017-08-22: 15 mg via INTRAVENOUS
  Filled 2017-08-22: qty 1

## 2017-08-22 MED ORDER — HYDROCODONE-ACETAMINOPHEN 5-325 MG PO TABS
1.0000 | ORAL_TABLET | Freq: Once | ORAL | Status: AC
  Administered 2017-08-22: 1 via ORAL
  Filled 2017-08-22: qty 1

## 2017-08-22 MED ORDER — ONDANSETRON HCL 4 MG/2ML IJ SOLN
4.0000 mg | Freq: Once | INTRAMUSCULAR | Status: AC
  Administered 2017-08-22: 4 mg via INTRAVENOUS
  Filled 2017-08-22: qty 2

## 2017-08-22 MED ORDER — OXYCODONE HCL 5 MG PO TABS: 5.0000 mg | ORAL_TABLET | ORAL | 0 refills | Status: DC | PRN

## 2017-08-22 MED ORDER — FENTANYL CITRATE (PF) 100 MCG/2ML IJ SOLN
50.0000 ug | INTRAMUSCULAR | Status: DC | PRN
  Administered 2017-08-22: 50 ug via NASAL
  Filled 2017-08-22: qty 2

## 2017-08-22 NOTE — Discharge Instructions (Signed)
Take Tylenol 1000 mg 4 times a day for 1 week. This is the maximum dose of Tylenol (acetaminophen) you can take from all sources. Please check other over-the-counter medications and prescriptions to ensure you are not taking other medications that contain acetaminophen.  You may also take ibuprofen 400 mg 6 times a day alternating with or at the same time as tylenol.  Take oxycodone as needed for breakthrough pain.  This medication can be addicting, sedating and cause constipation.   °

## 2017-08-22 NOTE — ED Notes (Signed)
Pt woke up at 0300 with right flank pain into groin, pt is a lot of pain, vomiting in triage and diaphoretic.

## 2017-08-22 NOTE — ED Notes (Signed)
Given urine strainer and cup at d/c.

## 2017-08-22 NOTE — ED Provider Notes (Signed)
[place] EMERGENCY DEPARTMENT Provider Note   CSN: [id number] Arrival date & time: 08/22/17  2725     History   Chief Complaint No chief complaint on file.   HPI Olivia [name] is a 52 y.o. female.  HPI  3AM woke up with severe right sided pain, severe right side with radiation to the back Nausea and vomiting 4 times Constipation, no diarrhea No fever No pain with urianation, just groin pain.   Past Medical History:  Diagnosis Date  . Anemia   . Arthritis   . GERD (gastroesophageal reflux disease)     Patient Active Problem List   Diagnosis Date Noted  . Current mild episode of major depressive disorder ([place]) 02/14/2017  . Fatigue 12/26/2016  . Nasal congestion 06/14/2014  . Contraception management 06/14/2014  . Obesity 05/23/2014  . Ruptured varicose vein 02/15/2013  . Preventative health care 01/15/2013  . IUD check up 09/14/2012  . MICROSCOPIC HEMATURIA 06/13/2008  . HEMORRHOIDS, NOS 07/03/2006    Past Surgical History:  Procedure Laterality Date  . CHOLECYSTECTOMY  05/2009     OB History    Gravida  4   Para  3   Term  3   Preterm      AB  1   Living  3     SAB      TAB  1   Ectopic      Multiple      Live Births               Home Medications    Prior to Admission medications   Medication Sig Start Date End Date Taking? Authorizing Provider  fluticasone (FLONASE) 50 MCG/ACT nasal spray Place 2 sprays into both nostrils daily. 06/14/14   Lupita Dawn, MD  ondansetron (ZOFRAN ODT) 4 MG disintegrating tablet Take 1 tablet (4 mg total) by mouth every 8 (eight) hours as needed for nausea or vomiting. 08/22/17   Gareth Morgan, MD  oxyCODONE (ROXICODONE) 5 MG immediate release tablet Take 1 tablet (5 mg total) by mouth every 4 (four) hours as needed for severe pain. 08/22/17   Gareth Morgan, MD    Family History Family History  Problem Relation Age of Onset  . Diabetes Paternal Grandfather   .  Diabetes Father   . Hypertension Father     Social History Social History   Tobacco Use  . Smoking status: Never Smoker  . Smokeless tobacco: Never Used  Substance Use Topics  . Alcohol use: Yes    Comment: occasionally  . Drug use: No     Allergies   Penicillins   Review of Systems Review of Systems  Constitutional: Negative for fever.  HENT: Negative for sore throat.   Eyes: Negative for visual disturbance.  Respiratory: Negative for cough and shortness of breath.   Cardiovascular: Negative for chest pain.  Gastrointestinal: Positive for abdominal pain, constipation, nausea and vomiting. Negative for diarrhea.  Genitourinary: Positive for flank pain. Negative for difficulty urinating and dysuria.  Musculoskeletal: Negative for back pain and neck pain.  Skin: Negative for rash.  Neurological: Negative for syncope and headaches.     Physical Exam Updated Vital Signs BP 112/69   Pulse (!) 59   Temp 98.1 F (36.7 C) (Oral)   Resp 16   SpO2 92%   Physical Exam  Constitutional: She is oriented to person, place, and time. She appears well-developed and well-nourished. No distress.  HENT:  Head: Normocephalic and atraumatic.  Eyes: Conjunctivae and EOM are normal.  Neck: Normal range of motion.  Cardiovascular: Normal rate, regular rhythm, normal heart sounds and intact distal pulses. Exam reveals no gallop and no friction rub.  No murmur heard. Pulmonary/Chest: Effort normal and breath sounds normal. No respiratory distress. She has no wheezes. She has no rales.  Abdominal: Soft. She exhibits no distension. There is no tenderness. There is no guarding.  Right CVA tenderness   Musculoskeletal: She exhibits no edema or tenderness.  Neurological: She is alert and oriented to person, place, and time.  Skin: Skin is warm and dry. No rash noted. She is not diaphoretic. No erythema.  Nursing note and vitals reviewed.    ED Treatments / Results  Labs (all labs  ordered are listed, but only abnormal results are displayed) Labs Reviewed  COMPREHENSIVE METABOLIC PANEL - Abnormal; Notable for the following components:      Result Value   CO2 20 (*)    Glucose, Bld 143 (*)    All other components within normal limits  URINALYSIS, ROUTINE W REFLEX MICROSCOPIC - Abnormal; Notable for the following components:   APPearance HAZY (*)    Hgb urine dipstick LARGE (*)    Squamous Epithelial / LPF 0-5 (*)    All other components within normal limits  LIPASE, BLOOD  CBC  I-STAT BETA HCG BLOOD, ED ([facility], [facility], [facility] ONLY)    EKG None  Radiology Dg Abdomen 1 View  Result Date: 08/22/2017 CLINICAL DATA:  Sharp RIGHT-sided abdominal pain since 0300 hours, RIGHT flank pain into groin, vomiting, diaphoresis EXAM: ABDOMEN - 1 VIEW COMPARISON:  CT abdomen and pelvis 05/11/2010 FINDINGS: Normal bowel gas pattern. No bowel dilatation or bowel wall thickening. Small rounded calcifications in pelvis likely represent phleboliths. No definite urinary tract calcification. Bones unremarkable. Surgical clips RIGHT upper quadrant likely cholecystectomy. IMPRESSION: No acute abnormalities. Electronically Signed   By: Lavonia Dana M.D.   On: 08/22/2017 12:59   US Renal  Result Date: 08/22/2017 CLINICAL DATA:  Right flank pain EXAM: RENAL / URINARY TRACT ULTRASOUND COMPLETE COMPARISON:  CT 05/11/2010 FINDINGS: Right Kidney: Length: Small amount of perinephric fluid on the right. Right kidney measures 11.1 cm. Mild right hydronephrosis. Multiple shadowing nonobstructing stones in the midpole of the right kidney, the largest 6 mm. No mass. Slightly increased echotexture. Left Kidney: Length: 10.0 cm. Echogenicity within normal limits. No mass or hydronephrosis visualized. Bladder: Appears normal for degree of bladder distention. Adjacent to the posterior right bladder wall, there appears to be a distal right ureteral stone measuring up to 7 mm. IMPRESSION: Mild right hydronephrosis and  perinephric stranding. There appears to be a 7 mm distal right ureteral stone posterior to the right bladder wall. Right nephrolithiasis. Electronically Signed   By: Rolm Baptise M.D.   On: 08/22/2017 11:28    Procedures Procedures (including critical care time)  Medications Ordered in ED Medications  ondansetron (ZOFRAN-ODT) disintegrating tablet 4 mg (4 mg Oral Given 08/22/17 0540)  sodium chloride 0.9 % bolus 1,000 mL (0 mLs Intravenous Stopped 08/22/17 1346)  ketorolac (TORADOL) 15 MG/ML injection 15 mg (15 mg Intravenous Given 08/22/17 1206)  HYDROcodone-acetaminophen (NORCO/VICODIN) 5-325 MG per tablet 1 tablet (1 tablet Oral Given 08/22/17 1346)  morphine 4 MG/ML injection 4 mg (4 mg Intravenous Given 08/22/17 1349)  ondansetron (ZOFRAN) injection 4 mg (4 mg Intravenous Given 08/22/17 1348)     Initial Impression / Assessment and Plan / ED Course  I have reviewed the triage vital signs  and the nursing notes.  Pertinent labs & imaging results that were available during my care of the patient were reviewed by me and considered in my medical decision making (see chart for details).     52yo female with history above including prior hx of nephrolithiasis presents with concern for right flank pain.  Labs show no transaminitis, no signs of pancreatitis. Preg negative. No sign of UTI.  Exam and hx not consistent with appendicitis, TOA, torsion.   Renal US positive for right hydronephrosis with likely 22mm distal right ureteral stone.  Given toradol, morphine, zofran, IV fluids with improvement. Given rx for oxycodone, zofran and recommend tylenol/ibuprofen . Rec Urology follow up, discussed reasons to return.   Final Clinical Impressions(s) / ED Diagnoses   Final diagnoses:  Right nephrolithiasis    ED Discharge Orders        Ordered    ondansetron (ZOFRAN ODT) 4 MG disintegrating tablet  Every 8 hours PRN     08/22/17 1441    oxyCODONE (ROXICODONE) 5 MG immediate release tablet  Every  4 hours PRN     08/22/17 1441       Gareth Morgan, MD 08/22/17 2157

## 2017-08-22 NOTE — ED Notes (Signed)
Patient transported to X-ray 

## 2017-08-29 ENCOUNTER — Encounter: Payer: Self-pay | Admitting: Internal Medicine

## 2017-09-05 ENCOUNTER — Other Ambulatory Visit: Payer: Self-pay

## 2017-09-05 ENCOUNTER — Encounter: Payer: Self-pay | Admitting: Internal Medicine

## 2017-09-05 ENCOUNTER — Ambulatory Visit: Payer: Self-pay | Admitting: Internal Medicine

## 2017-09-05 VITALS — BP 118/75 | HR 75 | Temp 98.3°F | Ht <= 58 in | Wt 160.5 lb

## 2017-09-05 DIAGNOSIS — Z09 Encounter for follow-up examination after completed treatment for conditions other than malignant neoplasm: Secondary | ICD-10-CM

## 2017-09-05 DIAGNOSIS — Z87442 Personal history of urinary calculi: Secondary | ICD-10-CM

## 2017-09-05 DIAGNOSIS — Z0000 Encounter for general adult medical examination without abnormal findings: Secondary | ICD-10-CM

## 2017-09-05 DIAGNOSIS — N200 Calculus of kidney: Secondary | ICD-10-CM

## 2017-09-05 DIAGNOSIS — Z8659 Personal history of other mental and behavioral disorders: Secondary | ICD-10-CM

## 2017-09-05 NOTE — Patient Instructions (Signed)
Olivia [name], fue un gusto verla de nuevo hoy.   El papanicolao que le hicieron en Febrero salio normal. En 3 aos va a necesitar el proximo.  Hoy recibio la tarjeta de muestras de Administrator, sports. Cuando la haya completado la puede enviar a la clinica para analisis.   Saque una cita de seguimiento conmigo en 6 meses o antes si lo necesita.   Llamenos con cualquier pregunta que tenga.

## 2017-09-07 ENCOUNTER — Encounter: Payer: Self-pay | Admitting: Internal Medicine

## 2017-09-07 DIAGNOSIS — N200 Calculus of kidney: Secondary | ICD-10-CM | POA: Insufficient documentation

## 2017-09-07 NOTE — Assessment & Plan Note (Signed)
Patient reports her home situation is much improved. Denies fatigue, difficulty concentrating, insomnia and reports normal appetite. Continues to exercise regularly and participating in church which she considers part of her support system.   - Stable, will continue to monitor

## 2017-09-07 NOTE — Assessment & Plan Note (Signed)
FOBT provided.

## 2017-09-07 NOTE — Progress Notes (Signed)
   CC: R nephrolithiasis follow up   HPI:  [name] is a 52 y.o. female with PMH listed below who presents to clinic for R nephrolithiasis follow up. Please see problem based assessment and plan for further details.   Past Medical History:  Diagnosis Date  . Anemia   . Arthritis   . GERD (gastroesophageal reflux disease)    Review of Systems:   Review of Systems  Constitutional: Negative for chills, fever and malaise/fatigue.  Respiratory: Negative for cough and shortness of breath.   Cardiovascular: Negative for chest pain, palpitations and leg swelling.  Gastrointestinal: Negative for abdominal pain, constipation, diarrhea, heartburn and nausea.  Genitourinary: Negative for dysuria, flank pain and hematuria.  Musculoskeletal: Negative for myalgias.  Neurological: Negative for dizziness and headaches.  Psychiatric/Behavioral: Negative for depression and suicidal ideas. The patient does not have insomnia.     Physical Exam:  Vitals:   09/05/17 1545  BP: 118/75  Pulse: 75  Temp: 98.3 F (36.8 C)  TempSrc: Oral  Weight: 160 lb 8 oz (72.8 kg)  Height: 4\' 9"  (1.448 m)   Physical Exam  Constitutional: She is oriented to person, place, and time and well-developed, well-nourished, and in no distress.  HENT:  Head: Normocephalic and atraumatic.  Mouth/Throat: Oropharynx is clear and moist.  Cardiovascular: Normal rate, regular rhythm and normal heart sounds. Exam reveals no gallop and no friction rub.  No murmur heard. Pulmonary/Chest: Effort normal and breath sounds normal. No respiratory distress. She has no wheezes. She has no rales.  Abdominal: Soft. Bowel sounds are normal. She exhibits no distension. There is no tenderness.  Musculoskeletal: Normal range of motion. She exhibits no edema or deformity.  Neurological: She is alert and oriented to person, place, and time.    Assessment & Plan:   See Encounters Tab for problem based charting.  Patient  discussed with Dr. Rebeca Alert

## 2017-09-07 NOTE — Assessment & Plan Note (Signed)
Patient recently seen in the ED 4/19 with flank pain and hematuria and found to have R nephrolithiasis that was treated conservatively. She reports passing 2 stones at home and is now asymptomatic. Hematuria also resolved.

## 2017-09-08 NOTE — Progress Notes (Signed)
Internal Medicine Clinic Attending  Case discussed with Dr. Santos-Sanchez  at the time of the visit.  We reviewed the resident's history and exam and pertinent patient test results.  I agree with the assessment, diagnosis, and plan of care documented in the resident's note.  Alexander N Raines, MD   

## 2018-02-16 ENCOUNTER — Other Ambulatory Visit: Payer: Self-pay

## 2018-02-23 LAB — CYTOLOGY - PAP: Diagnosis: NEGATIVE

## 2018-03-02 ENCOUNTER — Other Ambulatory Visit (HOSPITAL_COMMUNITY): Payer: Self-pay | Admitting: *Deleted

## 2018-03-02 DIAGNOSIS — Z1231 Encounter for screening mammogram for malignant neoplasm of breast: Secondary | ICD-10-CM

## 2018-03-13 ENCOUNTER — Ambulatory Visit: Payer: Self-pay

## 2018-03-20 ENCOUNTER — Other Ambulatory Visit: Payer: Self-pay

## 2018-03-20 ENCOUNTER — Encounter: Payer: Self-pay | Admitting: Internal Medicine

## 2018-03-20 ENCOUNTER — Ambulatory Visit: Payer: Self-pay | Admitting: Internal Medicine

## 2018-03-20 VITALS — BP 126/91 | HR 81 | Temp 98.0°F | Wt 159.0 lb

## 2018-03-20 DIAGNOSIS — Z0000 Encounter for general adult medical examination without abnormal findings: Secondary | ICD-10-CM

## 2018-03-20 DIAGNOSIS — R053 Chronic cough: Secondary | ICD-10-CM | POA: Insufficient documentation

## 2018-03-20 DIAGNOSIS — Z23 Encounter for immunization: Secondary | ICD-10-CM

## 2018-03-20 DIAGNOSIS — R05 Cough: Secondary | ICD-10-CM

## 2018-03-20 MED ORDER — BENZONATATE 100 MG PO CAPS: 100.0000 mg | ORAL_CAPSULE | Freq: Three times a day (TID) | ORAL | 1 refills | Status: DC | PRN

## 2018-03-20 NOTE — Patient Instructions (Addendum)
Olivia [name],   Envie la receta para las perlas de tessalon a su farmacia de [place].   Necesitamos hacerle una placa de pecho para evaluar su tos. Por favor dejeme saber cuando le aprueben la tarjeta naranja para ordenar la placa.   Haga una cita de seguimiento conmigo en 6 meses. Puede hacer una cita antes de eso si necesita verme antes.   - Dr. Frederico Hamman

## 2018-03-20 NOTE — Progress Notes (Signed)
   CC: Cough   HPI:  [name] is a 52 y.o. year-old female with PMH listed below who presents to clinic for evaluation of cough. Please see problem based assessment and plan for further details.   Past Medical History:  Diagnosis Date  . Anemia   . Arthritis   . GERD (gastroesophageal reflux disease)    Review of Systems:   Review of Systems  Constitutional: Negative for chills, fever, malaise/fatigue and weight loss.  HENT: Negative for congestion, sinus pain and sore throat.   Respiratory: Positive for cough. Negative for sputum production, shortness of breath, wheezing and stridor.   Cardiovascular: Negative for chest pain.  Neurological: Negative for dizziness and headaches.    Physical Exam:  Vitals:   03/20/18 1543  BP: (!) 126/91  Pulse: 81  Temp: 98 F (36.7 C)  TempSrc: Oral  SpO2: 96%  Weight: 159 lb (72.1 kg)    General: Well-appearing young female in no acute distress Cardiac: regular rate and rhythm, nl S1/S2, no murmurs, rubs or gallops Pulm: CTAB, no wheezes or crackles, no increased work of breathing on room air    Assessment & Plan:   See Encounters Tab for problem based charting.  Patient discussed with Dr. Lynnae [name]

## 2018-03-20 NOTE — Assessment & Plan Note (Addendum)
Olivia [name] presents today for evaluation of a dry cough that started 5 months ago after a common cold.  It has been worsening for the past month especially when she is exerting herself.  It does not worsen at night and is not associated with food intake or lying in a supine position.  She was evaluated by an outside provider who recommended albuterol inhaler and loratidine which have not helped.  She also started using Mucinex and Tessalon Perles from a friend and reports to [employer] help with the cough.  She denies recent illness, fever, chills, weight loss, malaise, myalgias, sore throat, allergies, sick contacts, or recent chemical exposure.  She is a never smoker and is not on any medications that could cause a chronic cough such as ACE inhibitors.  On exam her lungs are clear to auscultation bilaterally. Differential is broad including UACS, chronic bronchitis, asthma, GERD, infection, malignancy,etc. Will proceed as below.  - Recommended CXR for further evaluation, but patient unable to afford it. Currently applying for orange card. Advise her to call clinic when she obtains her card to order CXR.  - If CXR negative, will need PFTs with metacholine challenge  - Will continue Tessalon pearls in the mean time

## 2018-03-23 ENCOUNTER — Encounter: Payer: Self-pay | Admitting: Internal Medicine

## 2018-03-23 NOTE — Assessment & Plan Note (Signed)
Received flu shot today.  Colonoscopy pending orange card approval.

## 2018-03-25 NOTE — Progress Notes (Signed)
Internal Medicine Clinic Attending  Case discussed with Dr. Isac Sarna at the time of the visit.  We reviewed the resident's history and exam and pertinent patient test results.  I agree with the assessment, diagnosis, and plan of care documented in the resident's note.  I agree with need for CXR due to chronic cough.

## 2018-05-29 ENCOUNTER — Ambulatory Visit (HOSPITAL_COMMUNITY)
Admission: RE | Admit: 2018-05-29 | Discharge: 2018-05-29 | Disposition: A | Discharge status: Home / Self Care | Payer: Self-pay | Source: Ambulatory Visit | Attending: Internal Medicine | Admitting: Internal Medicine

## 2018-05-29 ENCOUNTER — Encounter (INDEPENDENT_AMBULATORY_CARE_PROVIDER_SITE_OTHER): Payer: Self-pay

## 2018-05-29 ENCOUNTER — Encounter: Payer: Self-pay | Admitting: Internal Medicine

## 2018-05-29 ENCOUNTER — Ambulatory Visit: Payer: Self-pay | Admitting: Internal Medicine

## 2018-05-29 ENCOUNTER — Other Ambulatory Visit: Payer: Self-pay

## 2018-05-29 VITALS — BP 123/75 | HR 106 | Temp 97.9°F | Ht <= 58 in | Wt 156.4 lb

## 2018-05-29 DIAGNOSIS — R053 Chronic cough: Secondary | ICD-10-CM

## 2018-05-29 DIAGNOSIS — R51 Headache: Secondary | ICD-10-CM

## 2018-05-29 DIAGNOSIS — R05 Cough: Secondary | ICD-10-CM

## 2018-05-29 DIAGNOSIS — R0789 Other chest pain: Secondary | ICD-10-CM

## 2018-05-29 DIAGNOSIS — R1110 Vomiting, unspecified: Secondary | ICD-10-CM

## 2018-05-29 DIAGNOSIS — R0602 Shortness of breath: Secondary | ICD-10-CM

## 2018-05-29 MED ORDER — HYDROCOD POLST-CPM POLST ER 10-8 MG/5ML PO SUER: 5.0000 mL | Freq: Two times a day (BID) | ORAL | 0 refills | Status: DC | PRN

## 2018-05-29 NOTE — Patient Instructions (Signed)
Olivia [name],   Hoy ordene una placa de pecho para ver si encontramos la causa de su tos. Si la placa sale negativa, el proximo paso seria un cat scan de pecho.   Para la tos, tome tussionex dos veces al dia cuando lo necesite. este medicamento puede causarle sueo, no lo use cuando vaya a guiar o a salir.   Tambien order pruebas pulmonares para ver si tiene asma, el hospital la llamara para coordinar esta prueba.   Llamenos si tiene [place] duda o pregunta.   - Dra. Frederico Hamman

## 2018-06-02 ENCOUNTER — Encounter: Payer: Self-pay | Admitting: Internal Medicine

## 2018-06-02 NOTE — Progress Notes (Signed)
   CC: Follow up for chronic cough   HPI:  [name] is a 53 y.o. year-old female with PMH listed below who presents to clinic for follow up of chronic cough. Please see problem based assessment and plan for further details.   Past Medical History:  Diagnosis Date  . Anemia   . Arthritis   . GERD (gastroesophageal reflux disease)    Review of Systems:   Review of Systems  Constitutional: Positive for malaise/fatigue. Negative for chills and fever.  Respiratory: Positive for cough, sputum production and shortness of breath. Negative for hemoptysis.   Cardiovascular: Negative for chest pain, palpitations and leg swelling.       Chest wall pain  Gastrointestinal: Positive for vomiting. Negative for abdominal pain.  Neurological: Positive for headaches.    Physical Exam:  Vitals:   05/29/18 1443  BP: 123/75  Pulse: (!) 106  Temp: 97.9 F (36.6 C)  TempSrc: Oral  SpO2: 92%  Weight: 156 lb 6.4 oz (70.9 kg)  Height: 4\' 9"  (1.448 m)    General:  Young female who appears uncomfortable, constantly coughing, in NAD  HENT: OP clear without exudates or erythema, nl dentition  Eyes: anicteric sclera, PERRL Cardiac: regular rate and rhythm, nl S1/S2, no murmurs, rubs or gallops, no JVD  Pulm: CTAB, no wheezes or crackles, no increased work of breathing on room air  Ext: warm and well perfused, no peripheral edema     Office Visit from 05/29/2018 in [facility]  PHQ-9 Total Score  6    Discussed PHQ-9 results with patient. Reports little energy and decreased interest due to cough. She does have a history of depression associated with home stressors, however, I think her score is more related to her acute illness vs depression.    Assessment & Plan:   See Encounters Tab for problem based charting.  Patient discussed with Dr. Dareen Piano

## 2018-06-02 NOTE — Assessment & Plan Note (Signed)
Olivia [name] presents today for follow up of chronic cough. She was last seen on 03/2018 for this. CXR and PFTs recommended but she was uninsured at the time and declined. She returns today reporting no improvement in cough. She now endorses a productive cough of clear sputum that is worse in the morning after she wakes up. No blood in the sputum. Denies weight loss but has lost 4 lbs since last visit. She is having a hard time taking deep breath due to the cough. She also reports multiple epidoses of NBNB emesis after cough spells, one of which occurred during this office visit. Tessalon pearls are no longer helping. She has been using an albuterol inhaler given by a friend, but it is not helping either.  Denies chest pain, palpitations, orthopnea, LE swelling, new exposures. The cough does not wake her up from sleep. On exam, she is oxygenating well on room air. I do not appreciate any wheezes, bu she does have very coarse breath sounds throughout all lung fields. She now has the [employer]. Will send her for CXR today and order PFTs as well. Wil order Tussionex for symptom relief.

## 2018-06-03 ENCOUNTER — Other Ambulatory Visit: Payer: Self-pay | Admitting: Internal Medicine

## 2018-06-03 DIAGNOSIS — R05 Cough: Secondary | ICD-10-CM

## 2018-06-03 DIAGNOSIS — R059 Cough, unspecified: Secondary | ICD-10-CM

## 2018-06-03 NOTE — Progress Notes (Signed)
I agree. Thank you.

## 2018-06-03 NOTE — Progress Notes (Signed)
Internal Medicine Clinic Attending  Case discussed with Dr. Santos-Sanchez at the time of the visit.  We reviewed the resident's history and exam and pertinent patient test results.  I agree with the assessment, diagnosis, and plan of care documented in the resident's note.    

## 2018-06-04 ENCOUNTER — Ambulatory Visit (HOSPITAL_COMMUNITY): Payer: Self-pay

## 2018-06-10 ENCOUNTER — Telehealth: Payer: Self-pay | Admitting: *Deleted

## 2018-06-10 ENCOUNTER — Other Ambulatory Visit: Payer: Self-pay | Admitting: Internal Medicine

## 2018-06-10 MED ORDER — HYDROCOD POLST-CPM POLST ER 10-8 MG/5ML PO SUER: 5.0000 mL | Freq: Two times a day (BID) | ORAL | 0 refills | Status: DC | PRN

## 2018-06-10 NOTE — Telephone Encounter (Signed)
graciela calls for pt and ask for cough syrup refill Also she states pt states someone from radiology called and could not speak spanish and progressed to hang up on pt. Made her aware of CT appt 2/12 and to be at [facility] at 1600 to adm and then radiology.

## 2018-06-11 NOTE — Telephone Encounter (Signed)
I sent a refill for Tussionex. Thank you Bonnita Nasuti!

## 2018-06-17 ENCOUNTER — Ambulatory Visit (HOSPITAL_COMMUNITY)
Admission: RE | Admit: 2018-06-17 | Discharge: 2018-06-17 | Disposition: A | Discharge status: Home / Self Care | Payer: Self-pay | Source: Ambulatory Visit | Attending: Internal Medicine | Admitting: Internal Medicine

## 2018-06-17 DIAGNOSIS — R05 Cough: Secondary | ICD-10-CM | POA: Insufficient documentation

## 2018-06-17 DIAGNOSIS — R059 Cough, unspecified: Secondary | ICD-10-CM

## 2018-06-18 ENCOUNTER — Other Ambulatory Visit: Payer: Self-pay | Admitting: Internal Medicine

## 2018-06-18 DIAGNOSIS — R053 Chronic cough: Secondary | ICD-10-CM

## 2018-06-18 DIAGNOSIS — R05 Cough: Secondary | ICD-10-CM

## 2018-06-22 ENCOUNTER — Other Ambulatory Visit: Payer: Self-pay | Admitting: Internal Medicine

## 2018-06-22 MED ORDER — HYDROCOD POLST-CPM POLST ER 10-8 MG/5ML PO SUER: 5.0000 mL | Freq: Two times a day (BID) | ORAL | 0 refills | Status: DC | PRN

## 2018-06-22 NOTE — Telephone Encounter (Signed)
Patient is requesting another refill on chlorpheniramine-HYDROcodone (TUSSIONEX PENNKINETIC ER) 10-8 MG/5ML SUER and albuterol (currently not on med list) to be sent to  [facility] [place] [address] [phone] (Phone) [phone] (Fax

## 2018-06-23 ENCOUNTER — Other Ambulatory Visit: Payer: Self-pay | Admitting: Internal Medicine

## 2018-06-23 MED ORDER — ALBUTEROL SULFATE HFA 108 (90 BASE) MCG/ACT IN AERS: 2.0000 | INHALATION_SPRAY | Freq: Four times a day (QID) | RESPIRATORY_TRACT | 2 refills | Status: DC | PRN

## 2018-06-23 NOTE — Telephone Encounter (Signed)
Albuterol inhaler sent to patient's pharmacy.

## 2018-06-23 NOTE — Telephone Encounter (Signed)
Pt also requesting albuterol  inhaler.  Please advise.Despina Hidden Cassady2/18/20209:49 AM

## 2018-06-26 ENCOUNTER — Telehealth: Payer: Self-pay | Admitting: *Deleted

## 2018-06-26 NOTE — Telephone Encounter (Signed)
Pt has spoken to graciella(sp) our interp., the pt is telling her that the dr has not called her and has not sent any medicine to the pharmacy. Pt wanted to be seen today but there are no open

## 2018-06-30 ENCOUNTER — Telehealth: Payer: Self-pay | Admitting: Internal Medicine

## 2018-06-30 NOTE — Telephone Encounter (Signed)
I called the number and they are closed for today. In the meanwhile, MR do you see where we could fit the patient in for a consult/ILD. They are requesting for pt to be seen sooner. Please advise.

## 2018-07-01 NOTE — Telephone Encounter (Signed)
Will need ILD questionnaire done pre-visit. Can be done on day of visit too with interpreter help  Needs PFT (full) done pre-visit  Needs blood work and it takes a week to result -> Serum: ESR, ACE, ANA, DS-DNA, RF, anti-CCP,  ANCA screen, MPO, PR-3, Total CK,  Aldolase,  ENP Panel ( ensure includes -> scl-70, ssA, ssB, anti-RNP, anti-JO-1) ,  & Hypersensitivity Pneumonitis Panel    So she can take all of march 2020 to get the above done. If she wants sooner then work with Anderson Malta in research or Raquel Sarna to plug her into a 30 min non-ILD slot but still above needs to be complete before she walks into room to see me

## 2018-07-01 NOTE — Telephone Encounter (Signed)
Call made to interpreter service, spoke with agent [facility] [id number]. LMTCB.

## 2018-07-27 ENCOUNTER — Telehealth (HOSPITAL_COMMUNITY): Payer: Self-pay

## 2018-07-27 NOTE — Telephone Encounter (Signed)
Left message with patient about [facility] appointment that will need to be rescheduled due to [id number]. Left name and number for patient to call back. °

## 2018-07-30 ENCOUNTER — Institutional Professional Consult (permissible substitution): Payer: Self-pay | Admitting: Internal Medicine

## 2018-08-04 ENCOUNTER — Other Ambulatory Visit: Payer: Self-pay

## 2018-08-04 ENCOUNTER — Encounter: Payer: Self-pay | Admitting: Internal Medicine

## 2018-08-04 ENCOUNTER — Institutional Professional Consult (permissible substitution): Payer: Self-pay | Admitting: Internal Medicine

## 2018-08-04 ENCOUNTER — Ambulatory Visit (INDEPENDENT_AMBULATORY_CARE_PROVIDER_SITE_OTHER): Payer: Self-pay | Admitting: Internal Medicine

## 2018-08-04 VITALS — BP 120/78 | HR 102 | Temp 97.7°F | Ht <= 58 in | Wt 158.0 lb

## 2018-08-04 DIAGNOSIS — R918 Other nonspecific abnormal finding of lung field: Secondary | ICD-10-CM | POA: Insufficient documentation

## 2018-08-04 DIAGNOSIS — R0902 Hypoxemia: Secondary | ICD-10-CM

## 2018-08-04 DIAGNOSIS — R053 Chronic cough: Secondary | ICD-10-CM

## 2018-08-04 DIAGNOSIS — R05 Cough: Secondary | ICD-10-CM

## 2018-08-04 MED ORDER — OMEPRAZOLE 20 MG PO CPDR
DELAYED_RELEASE_CAPSULE | ORAL | 11 refills | Status: DC
Start: 2018-08-04 — End: 2021-07-19

## 2018-08-04 MED ORDER — PREDNISONE 10 MG PO TABS
ORAL_TABLET | ORAL | 0 refills | Status: DC
Start: 2018-08-04 — End: 2018-09-01

## 2018-08-04 NOTE — Assessment & Plan Note (Addendum)
Onset of symptoms Aug 2019  - HRCT chest 06/18/2018 - 08/04/2018   Walked RA  2 laps @  approx 26ft each @ fast pace  stopped due to  End of study with sob and sats down to 82% - Collagen Vasc profile 08/04/2018     Miscellaneous:Alv microlithiasis, alv proteinosis, asp, bronchiectais, BOOP   ARDS/ AIP Occupational dz/ HSP> no hx to suggest exp Neoplasm Infection Drug > no h/o macrodantin, chemo/ amiodarone or other toxin Pulmonary emboli, Protein disorders Edema/Eosinophilic dz Sarcoidosis> crackles not typical  Connective tissue dz Hist X  Typically seen in smokers/ not supported by HRCT   Hemorrhage 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  Dec 2011 and also may have other beneficial effects as per the latest review in [employer] vol 193 D6387 Jun 20016.  This may not always be cause and effect, but given how universally underwhelming  (at least in terms of short term benefit)   and expensive  all the other  Drugs developed to date  have been for pf,   rec start  rx ppi / diet/ lifestyle modification and f/u with serial walking sats and lung volumes for now to put more points on the curve / establish firm baseline before considering additional measures.   Given amt of GG changes also ? 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

## 2018-08-04 NOTE — Progress Notes (Addendum)
Olivia [name], female    DOB: October 21, 1965,     MRN: [id number]   Brief patient profile:  53 yo [place] never  smoker  here [place] permanently  x 2005 with new cough > doe x summer of 2019 and HRCT c/w PF 06/18/2018 with no prior exposure hx or risk factors for pf  Including fm hx to her knowledge referred to pulmonary clinic 08/04/2018 by Dr   Olivia [name]      History of Present Illness  08/04/2018  Pulmonary/ 1st office eval/Olivia [name]  Chief Complaint  Patient presents with  . Pulmonary Consult    Referred by Dr Olivia [name]. Pt c/o SOB since August 2019. She gets SOB walking short distances and also when she makes the bed.   Dyspnea:  Indolent onset doe summer 2019   Now [facility] = can't walk a nl pace on a flat grade s sob but does fine slow and flat  Cough: to point of vomiting / mucus is clear  Sleep: flat bed and one pillow nonoct cough  SABA use: don't help/ neither does flovent or tessalon prn  No significant arthritis but feels better/ cough less p advil  2 dogs/ no birds  No h/o rheumatologic symptoms, chemo or amiodarone or macrodantin exp.    No obvious day to day or daytime variability or assoc e  purulent sputum or mucus plugs or hemoptysis or cp or chest tightness, subjective wheeze or overt sinus or hb symptoms.   Sleeps fine flat  without nocturnal  or early am exacerbation  of respiratory  c/o's or need for noct saba. Also denies any obvious fluctuation of symptoms with weather or environmental changes or other aggravating or alleviating factors except as outlined above   No unusual exposure hx or h/o childhood pna/ asthma or knowledge of premature birth.  Current Allergies, Complete Past Medical History, Past Surgical History, Family History, and Social History were reviewed in [employer] record.  ROS  The following are not active complaints unless bolded Hoarseness, sore throat, dysphagia, dental problems, itching, sneezing,  nasal congestion or discharge  of excess mucus or purulent secretions, ear ache,   fever, chills, sweats, unintended wt loss or wt gain, classically pleuritic or exertional cp,  orthopnea pnd or arm/hand swelling  or leg swelling, presyncope, palpitations, abdominal pain, anorexia, nausea, vomiting, diarrhea  or change in bowel habits or change in bladder habits, change in stools or change in urine, dysuria, hematuria,  rash, arthralgias, visual complaints, headache, numbness, weakness or ataxia or problems with walking or coordination,  change in mood or  memory.               Past Medical History:  Diagnosis Date  . Anemia   . Arthritis   . GERD (gastroesophageal reflux disease)     Outpatient Medications Prior to Visit  Medication Sig Dispense Refill  . benzonatate (TESSALON PERLES) 100 MG capsule Take 1 capsule (100 mg total) by mouth 3 (three) times daily as needed for cough. 30 capsule 1  . fluticasone (FLOVENT HFA) 44 MCG/ACT inhaler Inhale 1 puff into the lungs 2 (two) times daily.     Marland Kitchen albuterol (PROVENTIL HFA;VENTOLIN HFA) 108 (90 Base) MCG/ACT inhaler Inhale 2 puffs into the lungs every 6 (six) hours as needed for wheezing or shortness of breath. 1 Inhaler 2  . chlorpheniramine-HYDROcodone (TUSSIONEX PENNKINETIC ER) 10-8 MG/5ML SUER Take 5 mLs by mouth every 12 (twelve) hours as needed for cough. 70 mL 0  .  fluticasone (FLONASE) 50 MCG/ACT nasal spray Place 2 sprays into both nostrils daily. 16 g 1      Objective:     BP 120/78 (BP Location: Left Arm, Cuff Size: Normal)   Pulse (!) 102   Temp 97.7 F (36.5 C) (Oral)   Ht 4\' 9"  (1.448 m)   Wt 158 lb (71.7 kg)   SpO2 99%   BMI 34.19 kg/m   SpO2: 99 %  RA freq throat clearing  Wt Readings from Last 3 Encounters:  08/04/18 158 lb (71.7 kg)  05/29/18 156 lb 6.4 oz (70.9 kg)  03/20/18 159 lb (72.1 kg)       HEENT: nl dentition, turbinates bilaterally, and oropharynx. Nl external ear canals without cough reflex   NECK :  without JVD/Nodes/TM/  nl carotid upstrokes bilaterally   LUNGS: no acc muscle use,  Nl contour chest with   insp crackles bases  bilaterally with cough on insp maneuvers   CV:  RRR  no s3 or murmur or increase in P2, and no edema   ABD:  soft and nontender with nl inspiratory excursion in the supine position. No bruits or organomegaly appreciated, bowel sounds nl  MS:  Nl gait/ ext warm without deformities, calf tenderness, cyanosis ? Mild/early  Clubbing - No obvious joint restrictions   SKIN: warm and dry without lesions    NEURO:  alert, approp, nl sensorium with  no motor or cerebellar deficits apparent.    I personally reviewed images and agree with radiology impression as follows:   Chest HRCT  06/18/2018 Today's study is highly limited by respiratory motion. With these limitations in mind, there is evidence suggestive of interstitial lung disease considered indeterminate for usual interstitial pneumonia (UIP) per current ATS guidelines. Outpatient referral to Pulmonology for further evaluation is strongly recommended. Follow-up high-resolution chest CT is recommended in 12 months to assess for temporal changes in the appearance of the lung parenchyma. At the time of the repeat examination, the patient should be counseled to adequately follow breathing instructions to ensure diagnostic quality.   Labs ordered 08/04/2018  Collagen vasc screen     Assessment   Pulmonary infiltrates Onset of symptoms Aug 2019  - HRCT chest 06/18/2018 not dx  - 08/04/2018   Walked RA  2 laps @  approx 225ft each @ fast pace  stopped due to  End of study with sob and sats down to 82% - Collagen Vasc profile 08/04/2018     Miscellaneous:Alv microlithiasis, alv proteinosis, asp, bronchiectais, BOOP   ARDS/ AIP Occupational dz/ HSP> no hx to suggest exp Neoplasm Infection Drug > no h/o macrodantin, chemo/ amiodarone or other toxin Pulmonary emboli, Protein disorders Edema/Eosinophilic dz Sarcoidosis> crackles  not typical  Connective tissue dz Hist X  Typically seen in smokers/ not supported by HRCT   Hemorrhage 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  Dec 2011 and also may have other beneficial effects as per the latest review in [place] vol 193 E0814 Jun 20016.  This may not always be cause and effect, but given how universally underwhelming  (at least in terms of short term benefit)   and expensive  all the other  Drugs developed to date  have been for pf,   rec start  rx ppi / diet/ lifestyle modification and  f/u with serial walking sats and lung volumes for now to put more points on the curve / establish firm baseline before considering additional measures.   >>>> Given amt of GG changes also ? 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     Exercise hypoxemia 08/04/2018 desat at fast pace p 500 ft   For now hold off on ex 02 as she is stable and mostly limited by cough, not sob      Chronic cough Onset Aug 2019 neg resp to rx with short term pred/ flovent or saba  -  08/04/2018 trial of max rx for gerd plus pred 20 until better then 10 mg daily as occurring in setting of PF w/u in progress   Note cough occurs only on insp typical of ILD, not airways dz> so stop flovent/saba at this point    Total time devoted to counseling  > 50 % of initial 60 min office visit:  review case with pt via interpreter and personally observed walking 02 sats/  discussion of options/alternatives/ personally creating written customized instructions  in presence of pt  then going over those specific  Instructions directly with the pt including how to use all of the meds but in particular covering each new medication in  detail and the difference between the maintenance= "automatic" meds and the prns using an action plan format for the latter (If this problem/symptom => do that organization reading Left to right).  Please see AVS from this visit for a full list of these instructions which I personally wrote for this pt and  are unique to this visit.      Christinia Gully, MD 08/04/2018

## 2018-08-04 NOTE — Assessment & Plan Note (Addendum)
Onset Aug 2019 neg resp to rx with short term pred/ flovent or saba  -  08/04/2018 trial of max rx for gerd plus pred 20 until better then 10 mg daily as occurring in setting of PF w/u in progress   Note cough occurs only on insp typical of ILD, not airways dz> so stop flovent/saba at this point    Total time devoted to counseling  > 50 % of initial 60 min office visit:  review case with pt via interpreter and personally observed walking 02 sats/  discussion of options/alternatives/ personally creating written customized instructions  in presence of pt  then going over those specific  Instructions directly with the pt including how to use all of the meds but in particular covering each new medication in detail and the difference between the maintenance= "automatic" meds and the prns using an action plan format for the latter (If this problem/symptom => do that organization reading Left to right).  Please see AVS from this visit for a full list of these instructions which I personally wrote for this pt and  are unique to this visit.

## 2018-08-04 NOTE — Patient Instructions (Addendum)
GERD (REFLUX)  is an extremely common cause of respiratory symptoms just like yours , many times with no obvious heartburn at all.    It can be treated with medication, but also with lifestyle changes including elevation of the head of your bed (ideally with 6 -8inch blocks under the headboard of your bed),  Smoking cessation, avoidance of late meals, excessive alcohol, and avoid fatty foods, chocolate, peppermint, colas, red wine, and acidic juices such as orange juice.  NO MINT OR MENTHOL PRODUCTS SO NO COUGH DROPS  USE SUGARLESS CANDY INSTEAD (Jolley ranchers or Stover's or Life Savers) or even ice chips will also do - the key is to swallow to prevent all throat clearing. NO OIL BASED VITAMINS - use powdered substitutes.  Avoid fish oil when coughing.  Omeprazole 20 mg Take 30- 60 min before your first and last meals of the day   Prednisone 10 mg x 2 daily until better then 1 daily until return   Please remember to go to the lab department   for your tests - we will call you with the results when they are available.      Please schedule a follow up office visit in 4 weeks, sooner if needed  with all medications /inhalers/ solutions in hand so we can verify exactly what you are taking. This includes all medications from all doctors and over the counters   Please bring a family member to help interpret at this visit, thank you    La ERGE (REFLUX) es una causa extremadamente comn de sntomas respiratorios como el suyo, [place] veces sin ninguna acidez estomacal evidente.   Se puede tratar con medicamentos, pero tambin con cambios en el estilo de vida, incluida la elevacin de la cabecera de la cama (idealmente con bloques de 6 a 8 pulgadas debajo de la cabecera de la cama), dejar de fumar, evitar las comidas tardas, el consumo excesivo de alcohol y [employer] alimentos grasos , chocolate, menta, colas, vino tinto y jugos cidos como el jugo de [place]. NO HAY PRODUCTOS DE MENTA O MENTOL, POR  LO TANTO GOTAS DE TOS UTILICE CARAMELOS SIN AZCAR EN LUGAR (ganaderos Jolley o Stover's o Life Savers) o incluso los pedazos de hielo tambin lo harn: la clave es tragar para evitar que se aclare toda la garganta. VITAMINAS SIN ACEITE: utilice sustitutos en polvo. Evite el aceite de pescado al toser.  Omeprazol 20 mg Tomar 30-60 min antes de su primera y ltima comida del da  Prednisona 10 mg x 2 diariamente hasta mejor que 1 da hasta el regreso  Recuerde ir al departamento de laboratorio para sus pruebas; lo llamaremos con los resultados cuando estn disponibles.      Programe una visita de seguimiento a la oficina en 4 semanas, antes si es necesario con todos los medicamentos / inhaladores / soluciones en la mano para que podamos verificar exactamente lo que est tomando. Esto incluye todos los medicamentos de todos los mdicos y de [place].

## 2018-08-05 DIAGNOSIS — R0902 Hypoxemia: Secondary | ICD-10-CM | POA: Insufficient documentation

## 2018-08-05 LAB — CBC WITH DIFFERENTIAL/PLATELET
Basophils Absolute: 0.1 10*3/uL (ref 0.0–0.1)
Basophils Relative: 0.8 % (ref 0.0–3.0)
Eosinophils Absolute: 0.4 10*3/uL (ref 0.0–0.7)
Eosinophils Relative: 4.6 % (ref 0.0–5.0)
HCT: 43.5 % (ref 36.0–46.0)
Hemoglobin: 15 g/dL (ref 12.0–15.0)
Lymphocytes Relative: 27.9 % (ref 12.0–46.0)
Lymphs Abs: 2.3 10*3/uL (ref 0.7–4.0)
MCHC: 34.5 g/dL (ref 30.0–36.0)
MCV: 87.4 fl (ref 78.0–100.0)
Monocytes Absolute: 0.5 10*3/uL (ref 0.1–1.0)
Monocytes Relative: 5.9 % (ref 3.0–12.0)
Neutro Abs: 5 10*3/uL (ref 1.4–7.7)
Neutrophils Relative %: 60.8 % (ref 43.0–77.0)
PLATELETS: 306 10*3/uL (ref 150.0–400.0)
RBC: 4.97 Mil/uL (ref 3.87–5.11)
RDW: 13.2 % (ref 11.5–15.5)
WBC: 8.3 10*3/uL (ref 4.0–10.5)

## 2018-08-05 LAB — RHEUMATOID FACTOR

## 2018-08-05 LAB — SEDIMENTATION RATE: Sed Rate: 24 mm/hr (ref 0–30)

## 2018-08-05 LAB — ANA: Anti Nuclear Antibody (ANA): NEGATIVE

## 2018-08-05 NOTE — Assessment & Plan Note (Signed)
08/04/2018 desat at fast pace p 500 ft   For now hold off on ex 02 as she is stable and mostly limited by cough, not sob

## 2018-08-06 NOTE — Progress Notes (Signed)
Called and left detailed msg on machine through [employer]

## 2018-08-25 ENCOUNTER — Inpatient Hospital Stay (HOSPITAL_COMMUNITY): Admission: RE | Admit: 2018-08-25 | Payer: Self-pay | Source: Ambulatory Visit

## 2018-09-01 ENCOUNTER — Other Ambulatory Visit: Payer: Self-pay

## 2018-09-01 ENCOUNTER — Ambulatory Visit (INDEPENDENT_AMBULATORY_CARE_PROVIDER_SITE_OTHER): Payer: Self-pay | Admitting: Internal Medicine

## 2018-09-01 ENCOUNTER — Encounter: Payer: Self-pay | Admitting: Internal Medicine

## 2018-09-01 VITALS — BP 122/80 | HR 71 | Temp 98.3°F | Ht <= 58 in | Wt 160.0 lb

## 2018-09-01 DIAGNOSIS — R918 Other nonspecific abnormal finding of lung field: Secondary | ICD-10-CM

## 2018-09-01 DIAGNOSIS — R0902 Hypoxemia: Secondary | ICD-10-CM

## 2018-09-01 DIAGNOSIS — R05 Cough: Secondary | ICD-10-CM

## 2018-09-01 DIAGNOSIS — R053 Chronic cough: Secondary | ICD-10-CM

## 2018-09-01 MED ORDER — MOMETASONE FURO-FORMOTEROL FUM 100-5 MCG/ACT IN AERO: 2.0000 | INHALATION_SPRAY | Freq: Two times a day (BID) | RESPIRATORY_TRACT | 11 refills | Status: DC

## 2018-09-01 MED ORDER — MOMETASONE FURO-FORMOTEROL FUM 100-5 MCG/ACT IN AERO: 2.0000 | INHALATION_SPRAY | Freq: Two times a day (BID) | RESPIRATORY_TRACT | 0 refills | Status: DC

## 2018-09-01 MED ORDER — PREDNISONE 10 MG PO TABS: ORAL_TABLET | ORAL | 0 refills | Status: DC

## 2018-09-01 MED ORDER — GABAPENTIN 100 MG PO CAPS: 100.0000 mg | ORAL_CAPSULE | Freq: Three times a day (TID) | ORAL | 2 refills | Status: DC

## 2018-09-01 NOTE — Patient Instructions (Addendum)
Plan A = Automatic = dulera 100 Take 2 puffs first thing in am and then another 2 puffs about 12 hours later. Gabapentin 100 mg three times a day   Work on inhaler technique:  relax and gently blow all the way out then take a nice smooth deep breath back in, triggering the inhaler at same time you start breathing in.  Hold for up to 5 seconds if you can. Blow out thru nose. Rinse and gargle with water when done     Plan B = Backup Only use your albuterol inhaler as a rescue medication to be used if you can't catch your breath by resting or doing a relaxed purse lip breathing pattern.  - The less you use it, the better it will work when you need it. - Ok to use the inhaler up to 2 puffs  every 4 hours if you must but call for appointment if use goes up over your usual need - Don't leave home without it !!  (think of it like the spare tire for your car)     Please remember to go to the lab department   for your tests - we will call you with the results when they are available.   Continue omeprazole 20 mg Take 30- 60 min before your first and last meals of the day   GERD (REFLUX)  is an extremely common cause of respiratory symptoms just like yours , many times with no obvious heartburn at all.    It can be treated with medication, but also with lifestyle changes including elevation of the head of your bed (ideally with 6 -8inch blocks under the headboard of your bed),  Smoking cessation, avoidance of late meals, excessive alcohol, and avoid fatty foods, chocolate, peppermint, colas, red wine, and acidic juices such as orange juice.  NO MINT OR MENTHOL PRODUCTS SO NO COUGH DROPS  USE SUGARLESS CANDY INSTEAD (Jolley ranchers or Stover's or Life Savers) or even ice chips will also do - the key is to swallow to prevent all throat clearing. NO OIL BASED VITAMINS - use powdered substitutes.  Avoid fish oil when coughing.        Please schedule a follow up office visit in 4 weeks, sooner if needed       Plan A = Automtico = dulera 100 Tome 2 inhalaciones primero a la maana y [employer] otras 2 inhalaciones aproximadamente 12 horas despus. Gabapentina 100 mg tres veces al da  [employer] en la tcnica del inhalador: reljese y sople suavemente hasta que vuelva a respirar suavemente y profundamente, activando el inhalador al mismo tiempo que comienza a Advice worker. Sostenga durante 5 segundos si puede. Soplar por la Doran [place]and. [place] y haga grgaras con agua cuando haya terminado   Plan B = Copia de seguridad Solo use su inhalador de albuterol como medicamento de rescate para usarlo si no puede recuperar el aliento descansando o haciendo un patrn relajado de respiracin con el labio de la bolsa. - Cuanto menos lo use, mejor funcionar cuando lo necesite. - Est bien usar [employer] loss adjuster 2 inhalaciones cada 4 horas si tiene que [place], pero llame a una cita si el uso supera su necesidad habitual - No salgas de casa sin ella! (piense en ello como la llanta de repuesto para su automvil)   Recuerde ir al departamento de laboratorio para sus pruebas; lo llamaremos con los resultados cuando estn disponibles.   Continuar omeprazol 20 mg Tomar 30-60 min antes de su  primera y ltima comida del da  La ERGE (REFLUX) es una causa extremadamente comn de sntomas respiratorios como el [place], [place] veces sin ninguna acidez estomacal evidente.  Se puede tratar con medicamentos, pero tambin con cambios en el estilo de vida, incluida la elevacin de la cabecera de la cama (idealmente con bloques de 6 a 8 pulgadas debajo de la cabecera de la cama), dejar de fumar, evitar las comidas tardas, el consumo excesivo de alcohol y [employer] alimentos grasos , chocolate, menta, colas, vino tinto y jugos cidos como el jugo de [place]. SIN PRODUCTOS DE MENTA O MENTOL, POR LO QUE NO HAY GOTAS DE TOS UTILICE CARAMELOS SIN AZCAR EN LUGAR (ganaderos Jolley o Stover's o Life Savers) o incluso los pedazos de hielo  tambin lo harn: la clave es tragar para evitar que se aclare toda la garganta. VITAMINAS SIN ACEITE: utilice sustitutos en polvo. Evite el aceite de pescado al toser.    Programe una visita de seguimiento a la oficina en 4 semanas, antes si es necesario

## 2018-09-01 NOTE — Progress Notes (Signed)
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 Pulmonary Consult    Referred by Dr Frederico Hamman. Pt c/o SOB since August 2019. She gets SOB walking short distances and also when she makes the bed.   Dyspnea:  Indolent onset doe summer 2019   Now [facility] = can't walk a nl pace on a flat grade s sob but does fine slow and flat  Cough: to point of vomiting / mucus is clear  Sleep: flat bed and one pillow nonoct cough  SABA use: don't help/ neither does flovent or tessalon prn  No significant arthritis but feels better/ cough less p advil  2 dogs/ no birds  No h/o rheumatologic symptoms, chemo or amiodarone or macrodantin exp. rec GERD diet  Omeprazole 20 mg Take 30- 60 min before your first and last meals of the day  Prednisone 10 mg x 2 daily until better then 1 daily until return  Please schedule a follow up office visit in 4 weeks, sooner if needed  with all medications /inhalers/ solutions in hand Please bring a family member to help interpret at this visit, thank you    09/01/2018  f/u ov/Ranulfo Kall re:  PF with gg changes / rx pred 20  Chief Complaint  Patient presents with  . Follow-up    Breathing is some better. She is still taking 20 mg prednisone. She is using her ventolin once daily on average.   Dyspnea:  50-100 ft  Cough: still cough on insp/ no more vomiting Sleeping: able to sleep flat /one  SABA use: helps sob and cough / qvar does not 02: none    No obvious day to day or daytime variability or assoc excess/  purulent sputum or mucus plugs or hemoptysis or cp or chest tightness, subjective wheeze or overt sinus or hb symptoms.   Sleeping fine  without nocturnal  or early am exacerbation  of respiratory  c/o's or need for noct saba. Also denies any obvious fluctuation of symptoms with weather or environmental changes or other aggravating or alleviating factors except as outlined above   No unusual exposure hx or h/o childhood pna/ asthma or knowledge of premature birth.  Current Allergies, Complete Past Medical History, Past Surgical History, Family History, and Social History were reviewed in [employer] record.  ROS  The following are not active complaints unless bolded Hoarseness, sore throat, dysphagia, dental problems, itching, sneezing,  nasal congestion or discharge of excess mucus or purulent secretions, ear ache,   fever, chills, sweats, unintended wt loss or wt gain, classically pleuritic or exertional cp,  orthopnea pnd or arm/hand swelling  or leg swelling, presyncope, palpitations, abdominal pain, anorexia, nausea, vomiting, diarrhea  or change in bowel habits or change in bladder habits, change in stools or change in urine, dysuria, hematuria,  rash, arthralgias, visual complaints, headache, numbness, weakness or ataxia or problems with walking or coordination,  change in mood or  memory.        Current Meds  Medication Sig  . albuterol (VENTOLIN HFA) 108 (90 Base) MCG/ACT inhaler Inhale 2 puffs into the lungs every 6 (six) hours as needed for wheezing or shortness of breath.  Marland Kitchen omeprazole (PRILOSEC) 20 MG capsule Take 30- 60 min before your first and last meals of the day  . predniSONE (DELTASONE) 10 MG tablet Take 2 daily with breakfast until better then 1 daily                   Objective:     amb hispanic female nad with freq vigorous throat clearing   09/01/2018       160  08/04/18 158 lb (71.7 kg)  05/29/18 156 lb 6.4 oz (70.9 kg)  03/20/18 159  lb (72.1 kg)    Vital signs reviewed - Note on arrival 02 sats  96% on RA      HEENT: nl dentition, turbinates bilaterally, and oropharynx. Nl external ear canals without cough reflex   NECK :  without JVD/Nodes/TM/ nl carotid upstrokes bilaterally   LUNGS: no acc muscle use,  Nl contour chest with insp crackles bases bilaterally with  cough on insp   maneuvers   CV:  RRR  no s3 or murmur or increase in P2, and no edema   ABD:  soft and nontender with nl inspiratory excursion in the supine position. No bruits or organomegaly appreciated, bowel sounds nl  MS:  Nl gait/ ext warm without deformities, calf tenderness, cyanosis - Pos  Mild/modclubbing No obvious joint restrictions   SKIN: warm and dry without lesions    NEURO:  alert, approp, nl sensorium with  no motor or cerebellar deficits apparent.                  Assessment

## 2018-09-02 ENCOUNTER — Encounter: Payer: Self-pay | Admitting: Internal Medicine

## 2018-09-02 NOTE — Assessment & Plan Note (Signed)
Onset of symptoms Aug 2019  Chest HRCT  06/18/2018  highly limited by respiratory motion. With these limitations in mind, there is evidence suggestive of interstitial lung disease considered indeterminate for usual interstitial pneumonia (UIP) per current ATS guidelines.   - 08/04/2018   Walked RA  2 laps @  approx 227f each @ fast pace  stopped due to  End of study with sob and sats down to 82% - Collagen Vasc profile 08/04/2018  Nl RA, ANA, ESR  - HSP serology 09/01/2018    Clearly better than she was and only on mod doses of pred so no change rx = 20 mg ceiling and 10 mg floor for now

## 2018-09-02 NOTE — Assessment & Plan Note (Addendum)
Onset Aug 2019  neg resp to rx with short term pred/ flovent or saba  -  08/04/2018 trial of max rx for gerd plus pred 20 until better then 10 mg daily as occurring in setting of PF w/u in progress   - 09/01/2018  After extensive coaching inhaler device,  effectiveness =    75% > try dulera 100 2bid as may have component of cough variant asthma based on reported response to saba  - 09/01/2018 added gabapentin 100 mg tid trial  for throat clearing c/w irritable larynx syndrome unrelated to the lower airway  And encouraged use of non-mint/ menthol candy for same

## 2018-09-02 NOTE — Assessment & Plan Note (Signed)
08/04/2018 desat at fast pace p 500 ft  - 09/01/2018   Walked RA  2 laps @  approx 228ft each @ fast pace  stopped due to  End of study, sat 89% at end on pred 20 mg daily    Clearly improved, no need for 02    I had an extended discussion with the patient reviewing all relevant studies completed to date and  lasting 15 to 20 minutes of a 25 minute visit    See device teaching which extended face to face time for this visit as did observation of amb 02 sats .  Each maintenance medication was reviewed in detail including emphasizing most importantly the difference between maintenance and prns and under what circumstances the prns are to be triggered using an action plan format that is not reflected in the computer generated alphabetically organized AVS which I have not found useful in most complex patients, especially with respiratory illnesses  Please see AVS for specific instructions unique to this visit that I personally wrote and verbalized to the the pt in detail and then reviewed with pt  by my nurse highlighting any  changes in therapy recommended at today's visit to their plan of care.

## 2018-09-06 LAB — HYPERSENSITIVITY PNUEMONITIS PROFILE
ASPERGILLUS FUMIGATUS: NEGATIVE
Faenia retivirgula: NEGATIVE
Pigeon Serum: NEGATIVE
S. VIRIDIS: NEGATIVE
T. CANDIDUS: NEGATIVE
T. VULGARIS: NEGATIVE

## 2018-09-29 ENCOUNTER — Encounter: Payer: Self-pay | Admitting: Internal Medicine

## 2018-09-29 ENCOUNTER — Other Ambulatory Visit: Payer: Self-pay

## 2018-09-29 ENCOUNTER — Ambulatory Visit (INDEPENDENT_AMBULATORY_CARE_PROVIDER_SITE_OTHER): Payer: Self-pay | Admitting: Internal Medicine

## 2018-09-29 VITALS — BP 124/74 | HR 77 | Temp 98.0°F | Ht <= 58 in | Wt 163.6 lb

## 2018-09-29 DIAGNOSIS — R053 Chronic cough: Secondary | ICD-10-CM

## 2018-09-29 DIAGNOSIS — R918 Other nonspecific abnormal finding of lung field: Secondary | ICD-10-CM

## 2018-09-29 DIAGNOSIS — R0902 Hypoxemia: Secondary | ICD-10-CM

## 2018-09-29 DIAGNOSIS — R05 Cough: Secondary | ICD-10-CM

## 2018-09-29 MED ORDER — GABAPENTIN 300 MG PO CAPS: 300.0000 mg | ORAL_CAPSULE | Freq: Three times a day (TID) | ORAL | 2 refills | Status: DC

## 2018-09-29 NOTE — Progress Notes (Signed)
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 Pulmonary Consult    Referred by Dr Frederico Hamman. Pt c/o SOB since August 2019. She gets SOB walking short distances and also when she makes the bed.   Dyspnea:  Indolent onset doe summer 2019   Now [facility] = can't walk a nl pace on a flat grade s sob but does fine slow and flat  Cough: to point of vomiting / mucus is clear  Sleep: flat bed and one pillow nonoct cough  SABA use: don't help/ neither does flovent or tessalon prn  No significant arthritis but feels better/ cough less p advil  2 dogs/ no birds  No h/o rheumatologic symptoms, chemo or amiodarone or macrodantin exp. rec GERD diet  Omeprazole 20 mg Take 30- 60 min before your first and last meals of the day  Prednisone 10 mg x 2 daily until better then 1 daily until return  Please schedule a follow up office visit in 4 weeks, sooner if needed  with all medications /inhalers/ solutions in hand Please bring a family member to help interpret at this visit, thank you    09/01/2018  f/u ov/Wert re:  PF with gg changes / rx pred 20  Chief Complaint  Patient presents with  . Follow-up    Breathing is some better. She is still taking 20 mg prednisone. She is using her ventolin once daily on average.   Dyspnea:  50-100 ft  Cough: still cough on insp/ no more vomiting Sleeping: able to sleep flat /one  SABA use: helps sob and cough / qvar does not 02: none  rec Plan A = Automatic = dulera 100 Take 2  puffs first thing in am and then another 2 puffs about 12 hours later. Gabapentin 100 mg three times a day  Work on inhaler technique:   Plan B = Backup Only use your albuterol inhaler as a rescue medication   Please remember to go to the lab department   for your tests - we will call you with the results when they are available. Continue omeprazole 20 mg Take 30- 60 min before your first and last meals of the day  GERD diet    09/29/2018  f/u ov/Wert re:  PF / asthma improved on prednisone 10 mg qd  Chief Complaint  Patient presents with  . Follow-up    Breathing is doing well today. She has not had to use her rescue inhaler. She has had cough with clear sputum.    Dyspnea:  Walk up 30 min around neighborhood / trouble on hills only  Cough: dry /cough with mopping / deep breathing/ none noct  Sleeping: fine at bedtime flat/one pillo2 SABA use: none  02: none    No obvious day to day or daytime variability or assoc excess/ purulent sputum or mucus plugs or hemoptysis or cp or chest tightness, subjective wheeze or overt sinus  or hb symptoms.    sleeping  without nocturnal  or early am exacerbation  of respiratory  c/o's or need for noct saba. Also denies any obvious fluctuation of symptoms with weather or environmental changes or other aggravating or alleviating factors except as outlined above   No unusual exposure hx or h/o childhood pna/ asthma or knowledge of premature birth.  Current Allergies, Complete Past Medical History, Past Surgical History, Family History, and Social History were reviewed in [employer] record.  ROS  The following are not active complaints unless bolded Hoarseness, sore throat, dysphagia, dental problems, itching, sneezing,  nasal congestion or discharge of excess mucus or purulent secretions, ear ache,   fever, chills, sweats, unintended wt loss or wt gain, classically pleuritic or exertional cp,  orthopnea pnd or arm/hand swelling  or  leg swelling, presyncope, palpitations, abdominal pain, anorexia, nausea, vomiting, diarrhea  or change in bowel habits or change in bladder habits, change in stools or change in urine, dysuria, hematuria,  rash, arthralgias, visual complaints, headache, numbness, weakness or ataxia or problems with walking or coordination,  change in mood or  memory.        Current Meds  Medication Sig  . albuterol (VENTOLIN HFA) 108 (90 Base) MCG/ACT inhaler Inhale 2 puffs into the lungs every 6 (six) hours as needed for wheezing or shortness of breath.  . gabapentin (NEURONTIN) 100 MG capsule Take 1 capsule (100 mg total) by mouth 3 (three) times daily. One three times daily  . mometasone-formoterol (DULERA) 100-5 MCG/ACT AERO Inhale 2 puffs into the lungs 2 (two) times a day.  Marland Kitchen omeprazole (PRILOSEC) 20 MG capsule Take 30- 60 min before your first and last meals of the day  . predniSONE (DELTASONE) 10 MG tablet Take 2 daily with breakfast until better then 1 daily               Objective:    amb hispanic female still lots of throat clearing and cough on inspiraton  09/01/2018       160  08/04/18 158 lb (71.7 kg)  05/29/18 156 lb 6.4 oz (70.9 kg)  03/20/18 159 lb (72.1 kg)    Vital signs reviewed - Note on arrival 02 sats  98% on RA  Mild/mod clubbing  Min insp crackles bases     HEENT: nl dentition, turbinates bilaterally, and oropharynx. Nl external ear canals without cough reflex   NECK :  without JVD/Nodes/TM/ nl carotid upstrokes bilaterally   LUNGS: no acc muscle use,  Nl contour chest  With min insp crackles in bases  bilaterally with cough on deep  insp  maneuvers   CV:  RRR  no s3 or murmur or increase in P2, and no edema   ABD:  soft and nontender with nl inspiratory excursion in the supine position. No bruits or organomegaly appreciated, bowel sounds nl  MS:  Nl gait/ ext warm without deformities, calf tenderness, cyanosis or clubbing No obvious joint restrictions   SKIN:  warm and dry without lesions    NEURO:  alert, approp, nl sensorium with  no motor or cerebellar deficits apparent.                       Assessment

## 2018-09-29 NOTE — Assessment & Plan Note (Addendum)
Onset Aug 2019  neg resp to rx with short term pred/ flovent or saba  -  08/04/2018 trial of max rx for gerd plus pred 20 until better then 10 mg daily as occurring in setting of PF w/u in progress  - 09/01/2018  After extensive coaching inhaler device,  effectiveness =    75% > try dulera 100 2bid  - 09/01/2018 added gabapentin 100 mg tid   - 09/29/2018 still throa clearing /cough > rec 300 mg tid trial   In meantime also no change ppi bid and continue dulera 100 2bid esp as try to taper prednisone as may have component of cough variant asthma

## 2018-09-29 NOTE — Patient Instructions (Addendum)
Dulera 100 Take 2 puffs first thing in am and then another 2 puffs about 12 hours later.   Work on inhaler technique:  relax and gently blow all the way out then take a nice smooth deep breath back in, triggering the inhaler at same time you start breathing in.  Hold for up to 5 seconds if you can. Blow out thru nose. Rinse and gargle with water when done     Increase gabapentin to 300 mg three times daily   Prednisone 10 mg one on even, one half on odd days    Please schedule a follow up office visit in 4 weeks, sooner if needed       Dulera 100 Tome 2 inhalaciones a primera hora de la maana y luego otras 2 inhalaciones aproximadamente 12 horas despus.  Trabaje en la tcnica del inhalador: reljese y sople suavemente hasta que vuelva a respirar suavemente y profundamente, activando el inhalador al mismo tiempo que comienza a Advice worker. Sostenga por hasta 5 segundos si puede. Soplar por la Doran Durand. [place] y haga grgaras con agua cuando haya terminado   Aumente la gabapentina a 300 mg tres [employer].  Prednisona 10 mg uno en pares, la [employer] impares   Programe una visita de seguimiento a la oficina en 4 semanas, antes si es necesario

## 2018-09-29 NOTE — Assessment & Plan Note (Signed)
Onset of symptoms Aug 2019  Chest HRCT  06/18/2018  highly limited by respiratory motion. With these limitations in mind, there is evidence suggestive of interstitial lung disease considered indeterminate for usual interstitial pneumonia (UIP)   - 08/04/2018   Walked RA  2 laps @  approx 263f each @ fast pace  stopped due to  End of study with sob and sats down to 82% - see ex hypoxemia - Collagen Vasc profile 08/04/2018  Nl RA, ANA, ESR  - HSP serology 09/01/2018  Neg   Very unusual apparent response to prednisone with improved ex tolerance on very small doses.  The goal with a chronic steroid dependent illness is always arriving at the lowest effective dose that controls the disease/symptoms and not accepting a set "formula" which is based on statistics or guidelines that don't always take into account patient  variability or the natural hx of the dz in every individual patient, which may well vary over time.  For now therefore I recommend the patient maintain  10 mg a/w 5 mg daily until return in 4 weeks

## 2018-09-29 NOTE — Assessment & Plan Note (Addendum)
08/04/2018 desat at fast pace p 500 ft down to 82  - 09/01/2018   Walked RA  2 laps @  approx 251ft each @ fast pace  stopped due to  End of study, sat 89% at end on pred 20 mg daily   - 09/29/2018   Walked RA  2 laps @  approx 218ft each @ fast  pace  stopped due to  End of study, sats 89% / min sob   No need for 02 at this point, needs to learn to pace herself.

## 2018-10-01 ENCOUNTER — Ambulatory Visit: Payer: Self-pay

## 2018-10-27 ENCOUNTER — Encounter: Payer: Self-pay | Admitting: Internal Medicine

## 2018-10-27 ENCOUNTER — Ambulatory Visit (INDEPENDENT_AMBULATORY_CARE_PROVIDER_SITE_OTHER): Payer: No Typology Code available for payment source | Admitting: Internal Medicine

## 2018-10-27 ENCOUNTER — Other Ambulatory Visit: Payer: Self-pay

## 2018-10-27 DIAGNOSIS — R05 Cough: Secondary | ICD-10-CM

## 2018-10-27 DIAGNOSIS — R053 Chronic cough: Secondary | ICD-10-CM

## 2018-10-27 DIAGNOSIS — R0902 Hypoxemia: Secondary | ICD-10-CM

## 2018-10-27 DIAGNOSIS — R918 Other nonspecific abnormal finding of lung field: Secondary | ICD-10-CM

## 2018-10-27 MED ORDER — GABAPENTIN 100 MG PO CAPS: ORAL_CAPSULE | ORAL | 2 refills | Status: DC

## 2018-10-27 MED ORDER — MOMETASONE FURO-FORMOTEROL FUM 100-5 MCG/ACT IN AERO: 2.0000 | INHALATION_SPRAY | Freq: Two times a day (BID) | RESPIRATORY_TRACT | 0 refills | Status: DC

## 2018-10-27 MED ORDER — GABAPENTIN 300 MG PO CAPS: 300.0000 mg | ORAL_CAPSULE | Freq: Every day | ORAL | Status: DC

## 2018-10-27 MED ORDER — PREDNISONE 10 MG PO TABS: ORAL_TABLET | ORAL | 11 refills | Status: DC

## 2018-10-27 NOTE — Patient Instructions (Addendum)
Work on inhaler technique:  relax and gently blow all the way out then take a nice smooth deep breath back in, triggering the inhaler at same time you start breathing in.  Hold for up to 5 seconds if you can. Blow out thru nose. Rinse and gargle with water when done    Try prednisone 10 mg one half daily - if worse then start back at 20 mg daily until better, then 10 mg per day for a week then back to 5 mg daily    Please schedule a follow up office visit in 6 weeks, call sooner if needed

## 2018-10-27 NOTE — Progress Notes (Signed)
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 Pulmonary Consult    Referred by Dr Frederico Hamman. Pt c/o SOB since August 2019. She gets SOB walking short distances and also when she makes the bed.   Dyspnea:  Indolent onset doe summer 2019   Now [facility] = can't walk a nl pace on a flat grade s sob but does fine slow and flat  Cough: to point of vomiting / mucus is clear  Sleep: flat bed and one pillow nonoct cough  SABA use: don't help/ neither does flovent or tessalon prn  No significant arthritis but feels better/ cough less p advil  2 dogs/ no birds  No h/o rheumatologic symptoms, chemo or amiodarone or macrodantin exp. rec GERD diet  Omeprazole 20 mg Take 30- 60 min before your first and last meals of the day  Prednisone 10 mg x 2 daily until better then 1 daily until return  Please schedule a follow up office visit in 4 weeks, sooner if needed  with all medications /inhalers/ solutions in hand Please bring a family member to help interpret at this visit, thank you    09/01/2018  f/u ov/[name] re:  PF with gg changes / rx pred 20  Chief Complaint  Patient presents with  . Follow-up    Breathing is some better. She is still taking 20 mg prednisone. She is using her ventolin once daily on average.   Dyspnea:  50-100 ft  Cough: still cough on insp/ no more vomiting Sleeping: able to sleep flat /one  SABA use: helps sob and cough / qvar does not 02: none  rec Plan A = Automatic = dulera 100 Take 2  puffs first thing in am and then another 2 puffs about 12 hours later. Gabapentin 100 mg three times a day  Work on inhaler technique:   Plan B = Backup Only use your albuterol inhaler as a rescue medication   Please remember to go to the lab department   for your tests - we will call you with the results when they are available. Continue omeprazole 20 mg Take 30- 60 min before your first and last meals of the day  GERD diet    09/29/2018  f/u ov/[name] re:  PF / asthma improved on prednisone 10 mg qd  Chief Complaint  Patient presents with  . Follow-up    Breathing is doing well today. She has not had to use her rescue inhaler. She has had cough with clear sputum.    Dyspnea:  Walk up 30 min around neighborhood / trouble on hills only  Cough: dry /cough with mopping / deep breathing/ none noct  Sleeping: fine at bedtime flat/one pillo2 SABA use: none  02: none  rec Dulera 100 Take 2 puffs first thing in am and then another 2 puffs about 12 hours later.  Work on inhaler technique:  Increase gabapentin to 300 mg  three times daily  Prednisone 10 mg one on even, one half on odd days  Please schedule a follow up office visit in 4 weeks, sooner if needed     10/27/2018  f/u ov/[name] re: pf/ cough variant asthma with uacs  On gabapentin 300 mg hs and still on prednisone 10 mg daily   Chief Complaint  Patient presents with  . Follow-up    Cough has improved some.   Dyspnea:  Walking up to  30 min daily Cough: better esp while on gabapentin / min mucoid Sleeping: fine flat one pillow SABA use: very little 02: none    No obvious day to day or daytime variability or assoc excess/ purulent sputum or mucus plugs or hemoptysis or cp or chest tightness, subjective wheeze or overt sinus or hb symptoms.   Sleeping as above  without nocturnal  or early am exacerbation  of respiratory  c/o's or need for noct saba. Also denies any obvious fluctuation of symptoms with weather or environmental  changes or other aggravating or alleviating factors except as outlined above   No unusual exposure hx or h/o childhood pna/ asthma or knowledge of premature birth.  Current Allergies, Complete Past Medical History, Past Surgical History, Family History, and Social History were reviewed in [employer] record.  ROS  The following are not active complaints unless bolded Hoarseness, sore throat, dysphagia, dental problems, itching, sneezing,  nasal congestion or discharge of excess mucus or purulent secretions, ear ache,   fever, chills, sweats, unintended wt loss or wt gain, classically pleuritic or exertional cp,  orthopnea pnd or arm/hand swelling  or leg swelling, presyncope, palpitations, abdominal pain, anorexia, nausea, vomiting, diarrhea  or change in bowel habits or change in bladder habits, change in stools or change in urine, dysuria, hematuria,  rash, arthralgias, visual complaints, headache, numbness, weakness or ataxia or problems with walking or coordination,  change in mood or  memory.        Current Meds  Medication Sig  . albuterol (VENTOLIN HFA) 108 (90 Base) MCG/ACT inhaler Inhale 2 puffs into the lungs every 6 (six) hours as needed for wheezing or shortness of breath.  . gabapentin (NEURONTIN) 300 MG capsule Take 1 capsule (300 mg total) by mouth 3 (three) times daily. (Patient taking differently: 100 mg in the morning and 300 mg at night)  . mometasone-formoterol (DULERA) 100-5 MCG/ACT AERO Inhale 2 puffs into the lungs 2 (two) times a day.  Marland Kitchen omeprazole (PRILOSEC) 20 MG capsule Take 30- 60 min before your first and last meals of the day  . predniSONE (DELTASONE) 10 MG tablet Take 2 daily with breakfast until better then 1 daily                   Objective:    amb hispanic female, freq throat clearing    10/27/2018       164  09/01/2018       160  08/04/18 158 lb (71.7 kg)  05/29/18 156 lb 6.4 oz (70.9 kg)  03/20/18 159 lb (72.1 kg)    Vital  signs reviewed - Note on arrival 02 sats  96% on RA     HEENT: nl dentition, turbinates bilaterally, and oropharynx. Nl external ear canals without cough reflex   NECK :  without JVD/Nodes/TM/ nl carotid upstrokes bilaterally   LUNGS: no acc muscle use,  Nl contour chest min insp crackles  bilaterally without cough on insp or exp maneuvers  CV:  RRR  no s3 or murmur or increase in P2, and no edema   ABD:  soft and nontender with nl inspiratory excursion in the supine position. No bruits or organomegaly appreciated, bowel sounds nl  MS:  Nl gait/ ext warm without deformities, calf tenderness, cyanosis - mild/mod clubbing No obvious joint restrictions   SKIN: warm and dry without lesions    NEURO:  alert, approp, nl sensorium with  no motor or cerebellar deficits apparent.              Assessment

## 2018-11-01 ENCOUNTER — Encounter: Payer: Self-pay | Admitting: Internal Medicine

## 2018-11-01 NOTE — Assessment & Plan Note (Signed)
08/04/2018 desat at fast pace p 500 ft down to 82  - 09/01/2018   Walked RA  2 laps @  approx 253ft each @ fast pace  stopped due to  End of study, sat 89% at end on pred 20 mg daily   - 09/29/2018   Walked RA  2 laps @  approx 249ft each @ fast  pace  stopped due to  End of study, sats 89% / min sob on prednisone 10 mg daily    - 10/27/2018   Walked RA  2 laps @  approx 26ft each @ nl  pace  stopped due to  End of study, sats 89%, min sob on pred 10 mg daily   I had an extended discussion with the patient /interpreter reviewing all relevant studies completed to date and  lasting 15 to 20 minutes of a 25 minute visit  which included directly observing ambulatory 02 saturation study documented in a/p section of  today's  office note.  Each maintenance medication was reviewed in detail including most importantly the difference between maintenance and prns and under what circumstances the prns are to be triggered using an action plan format that is not reflected in the computer generated alphabetically organized AVS.     Please see AVS for specific instructions unique to this visit that I personally wrote and verbalized to the the pt in detail and then reviewed with pt  by my nurse highlighting any changes in therapy recommended at today's visit .

## 2018-11-01 NOTE — Assessment & Plan Note (Addendum)
Onset of symptoms Aug 2019  Chest HRCT  06/18/2018  highly limited by respiratory motion. With these limitations in mind, there is evidence suggestive of interstitial lung disease considered indeterminate for usual interstitial pneumonia (UIP)   - 08/04/2018   Walked RA  2 laps @  approx 253f each @ fast pace  stopped due to  End of study with sob and sats down to 82% - see ex hypoxemia - Collagen Vasc profile 08/04/2018  Nl RA, ANA, ESR=24  - HSP serology 09/01/2018  Neg   At this point not really clear whether prednisone helping and would like to minimize if possible > ceiling of 20 and floor of 5 mg daily

## 2018-11-01 NOTE — Assessment & Plan Note (Addendum)
Onset Aug 2019  neg resp to rx with short term pred/ flovent or saba  -  08/04/2018 trial of max rx for gerd plus pred 20 until better then 10 mg daily as occurring in setting of PF w/u in progress  - 09/01/2018   try dulera 100 2bid  - 09/01/2018 added gabapentin 100 mg tid    - 09/29/2018 still throat clearing /cough > rec 300 mg tid trial > could only tolerate 300 mg qhs as of 10/27/2018 so try adding just 100 mg each am - 10/27/2018  After extensive coaching inhaler device,  effectiveness =    25% so needs spacer / recoaching

## 2018-11-26 ENCOUNTER — Other Ambulatory Visit: Payer: Self-pay

## 2018-11-26 ENCOUNTER — Encounter (HOSPITAL_COMMUNITY): Payer: Self-pay | Admitting: *Deleted

## 2018-11-26 ENCOUNTER — Encounter (HOSPITAL_COMMUNITY): Payer: Self-pay

## 2018-11-26 ENCOUNTER — Ambulatory Visit (HOSPITAL_COMMUNITY)
Admission: RE | Admit: 2018-11-26 | Discharge: 2018-11-26 | Disposition: A | Discharge status: Home / Self Care | Payer: No Typology Code available for payment source | Source: Ambulatory Visit | Attending: Obstetrics and Gynecology | Admitting: Obstetrics and Gynecology

## 2018-11-26 ENCOUNTER — Ambulatory Visit
Admission: RE | Admit: 2018-11-26 | Discharge: 2018-11-26 | Disposition: A | Discharge status: Home / Self Care | Payer: No Typology Code available for payment source | Source: Ambulatory Visit | Attending: Obstetrics and Gynecology | Admitting: Obstetrics and Gynecology

## 2018-11-26 DIAGNOSIS — Z1231 Encounter for screening mammogram for malignant neoplasm of breast: Secondary | ICD-10-CM

## 2018-11-26 DIAGNOSIS — Z1239 Encounter for other screening for malignant neoplasm of breast: Secondary | ICD-10-CM | POA: Insufficient documentation

## 2018-11-26 HISTORY — DX: Encounter for other screening for malignant neoplasm of breast: Z12.39

## 2018-11-26 NOTE — Progress Notes (Signed)
No complaints today.   Pap Smear: Pap smear not completed today. Last Pap smear was 02/16/2018 at the free cervical cancer screening at the [facility] and normal. Per patient has no history of an abnormal Pap smear. Last three Pap smear results are in Epic.  Physical exam: Breasts Breasts symmetrical. No skin abnormalities bilateral breasts. No nipple retraction bilateral breasts. No nipple discharge bilateral breasts. No lymphadenopathy. No lumps palpated bilateral breasts. No complaints of pain or tenderness on exam. Referred patient to the [place] for a screening mammogram. Appointment scheduled for Thursday, November 26, 2018 at 1540.        Pelvic/Bimanual No Pap smear completed today since last Pap smear was 02/16/2018. Pap smear not indicated per BCCCP guidelines.   Smoking History: Patient has never smoked.  Patient Navigation: Patient education provided. Access to services provided for patient through [facility] program. Spanish interpreter provided.   Colorectal Cancer Screening: Per patient has never had a colonoscopy completed. FIT Test completed 08/05/2016 that was negative. No complaints today.   Breast and Cervical Cancer Risk Assessment: Patient has no family history of breast cancer, known genetic mutations, or radiation treatment to the chest before age 34. Patient has no history of cervical dysplasia, immunocompromised, or DES exposure in-utero.  Risk Assessment    Risk Scores      11/26/2018   Last edited by: Armond Hang, LPN   5-year risk: 0.6 %   Lifetime risk: 4.8 %         Used Spanish interpreter [employer] from [place].

## 2018-11-26 NOTE — Patient Instructions (Signed)
Explained breast self awareness with Olivia [name]. Patient did not need a Pap smear today due to last Pap smear was 02/16/2018. Let her know BCCCP will cover Pap smears every 3 years unless has a history of abnormal Pap smears. Referred patient to the [facility] for a screening mammogram. Appointment scheduled for Thursday, November 26, 2018 at 1540. Patient aware of appointment and will be there. Let [facility] know [facility] will [facility] up with her within the next couple weeks with results of mammogram by letter or phone. Olivia [name] verbalized understanding.  [place]ann[place], Arvil Chaco, RN 2:41 PM

## 2018-12-09 ENCOUNTER — Other Ambulatory Visit: Payer: Self-pay

## 2018-12-09 ENCOUNTER — Inpatient Hospital Stay: Payer: Self-pay | Attending: Obstetrics and Gynecology | Admitting: *Deleted

## 2018-12-09 VITALS — BP 146/94 | Temp 97.7°F | Ht 60.0 in | Wt 163.0 lb

## 2018-12-09 DIAGNOSIS — Z0000 Encounter for general adult medical examination without abnormal findings: Secondary | ICD-10-CM

## 2018-12-09 NOTE — Progress Notes (Signed)
[facility] initial screening   interpreter- Rudene Anda, [employer]   Clinical Measurement:  Height: 60 in Weight: 163 lb  Blood Pressure: 156/84  Blood Pressure #2: 146/94  Fasting Labs Drawn Today, will review with patient when they result.   Medical History:  Patient states that she does not have a history of high cholesterol, high blood pressure or diabetes.  Medications:  Patient states that she does not take medication to lower cholesterol, blood pressure or blood sugar. Patient does not take an aspirin a day to help prevent a heart attack or stroke.    Blood pressure, self measurement:  :  Patient states that she does measure blood pressure from home monthly. Patient does not share blood pressure readings with a health care provider.    Nutrition: Patient states that on average she eats 1cup of fruit and 1 cup of vegetables per day. Patient states that she does not eat fish at least 2 times per week. Patient eats less than half servings of whole grains. Patient drinks less than 36 ounces of beverages with added sugar weekly. Patient is currently watching sodium or salt intake. In the past 7 days patient has not had any drinks containing alcohol. On average patient does not drink any drinks containing alcohol.      Physical activity:  Patient states that she gets 150 minutes of moderate and 0 minutes of vigorous physical activity each week.  Smoking status:  Patient states that she has never smoked tobacco.   Quality of life:  Over the past 2 weeks patient states that she has not had any days where she has little interest or pleasure in doing things and 0 days where she has felt down, depressed or hopeless.    Risk reduction and counseling: Informed patient that I would be referring her to her doctor at Internal Medicine for her elevated blood pressure readings from today.   [place] with patient today about adding extra fruit and vegetables in her daily diet. Encouraged  patient to try and get 2 servings of fruit and 3 servings of vegetables. Also spoke with patient about adding heart healthy fish and whole grains in diet. Patient stated that she struggles with sweets and pastries. Encouraged patient to try and reduce the amount of sweets in diet and to eat sweets in moderation. Also gave patient a pedometer to use with her daily walking. Encouraged her to keep walking 30 minutes daily,    Navigation:  I will notify patient of lab results.  Patient is aware of 2 more health coaching sessions and a follow up. Will call patient with appointment for follow-up for elevated BP with [facility].   Time: 25 minutes

## 2018-12-10 LAB — LIPID PANEL W/O CHOL/HDL RATIO
Cholesterol, Total: 212 mg/dL — ABNORMAL HIGH (ref 100–199)
HDL: 53 mg/dL (ref >39)
LDL Calculated: 139 mg/dL — ABNORMAL HIGH (ref 0–99)
Triglycerides: 102 mg/dL (ref 0–149)
VLDL Cholesterol Cal: 20 mg/dL (ref 5–40)

## 2018-12-10 LAB — HGB A1C W/O EAG: Hgb A1c MFr Bld: 5.5 % (ref 4.8–5.6)

## 2018-12-10 LAB — GLUCOSE, RANDOM: Glucose: 89 mg/dL (ref 65–99)

## 2018-12-14 ENCOUNTER — Telehealth: Payer: Self-pay

## 2018-12-14 NOTE — Telephone Encounter (Signed)
Called patient to go over [facility] lab results. Patient was not at home and asked if I could call back after 2 pm. Informed patient that I would call her back later today.

## 2018-12-15 ENCOUNTER — Ambulatory Visit: Payer: No Typology Code available for payment source | Admitting: Internal Medicine

## 2018-12-15 ENCOUNTER — Encounter: Payer: Self-pay | Admitting: Internal Medicine

## 2018-12-15 ENCOUNTER — Telehealth: Payer: Self-pay

## 2018-12-15 ENCOUNTER — Other Ambulatory Visit: Payer: Self-pay

## 2018-12-15 DIAGNOSIS — R05 Cough: Secondary | ICD-10-CM

## 2018-12-15 DIAGNOSIS — R053 Chronic cough: Secondary | ICD-10-CM

## 2018-12-15 DIAGNOSIS — R918 Other nonspecific abnormal finding of lung field: Secondary | ICD-10-CM

## 2018-12-15 NOTE — Telephone Encounter (Signed)
Called patient via interpreter Olivia [name] to go over [facility] lab results. Left message that I would try and call her back later.

## 2018-12-15 NOTE — Patient Instructions (Addendum)
Try again to take gabapentin to 300 mg twice  Daily if you can - if not, resume previous dose  No change on prednisone dose for now = 5 mg daily   Ideally need to track 02 levels at peak activity at least once a week to monitor your lung condition   Please schedule a follow up visit in 3 months but call sooner if needed     ntente nuevamente tomar gabapentina a 300 mg dos veces al da si puede; si no, reanude la dosis anterior  Sin cambios en la dosis de prednisona por ahora = 5 mg al da  Lo ideal sera Optometrist un seguimiento de los niveles de 02 en la actividad mxima al menos una vez a la semana para controlar su condicin pulmonar   Programe una visita de seguimiento en 3 meses, pero llame antes si es necesario

## 2018-12-15 NOTE — Progress Notes (Signed)
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 Pulmonary Consult    Referred by Dr Frederico Hamman. Pt c/o SOB since August 2019. She gets SOB walking short distances and also when she makes the bed.   Dyspnea:  Indolent onset doe summer 2019   Now [facility] = can't walk a nl pace on a flat grade s sob but does fine slow and flat  Cough: to point of vomiting / mucus is clear  Sleep: flat bed and one pillow nonoct cough  SABA use: don't help/ neither does flovent or tessalon prn  No significant arthritis but feels better/ cough less p advil  2 dogs/ no birds  No h/o rheumatologic symptoms, chemo or amiodarone or macrodantin exp. rec GERD diet  Omeprazole 20 mg Take 30- 60 min before your first and last meals of the day  Prednisone 10 mg x 2 daily until better then 1 daily until return  Please schedule a follow up office visit in 4 weeks, sooner if needed  with all medications /inhalers/ solutions in hand Please bring a family member to help interpret at this visit, thank you    09/01/2018  f/u ov/Wert re:  PF with gg changes / rx pred 20  Chief Complaint  Patient presents with  . Follow-up    Breathing is some better. She is still taking 20 mg prednisone. She is using her ventolin once daily on average.   Dyspnea:  50-100 ft  Cough: still cough on insp/ no more vomiting Sleeping: able to sleep flat /one  SABA use: helps sob and cough / qvar does not 02: none  rec Plan A = Automatic = dulera 100 Take 2  puffs first thing in am and then another 2 puffs about 12 hours later. Gabapentin 100 mg three times a day  Work on inhaler technique:   Plan B = Backup Only use your albuterol inhaler as a rescue medication   Please remember to go to the lab department   for your tests - we will call you with the results when they are available. Continue omeprazole 20 mg Take 30- 60 min before your first and last meals of the day  GERD diet    09/29/2018  f/u ov/Wert re:  PF / asthma improved on prednisone 10 mg qd  Chief Complaint  Patient presents with  . Follow-up    Breathing is doing well today. She has not had to use her rescue inhaler. She has had cough with clear sputum.    Dyspnea:  Walk up 30 min around neighborhood / trouble on [place] only  Cough: dry /cough with mopping / deep breathing/ none noct  Sleeping: fine at bedtime flat/one pillo2 SABA use: none  02: none  rec Dulera 100 Take 2 puffs first thing in am and then another 2 puffs about 12 hours later.  Work on inhaler technique:  Increase gabapentin to 300 mg  three times daily  Prednisone 10 mg one on even, one half on odd days  Please schedule a follow up office visit in 4 weeks, sooner if needed     10/27/2018  f/u ov/Wert re: pf/ cough variant asthma with uacs  On gabapentin 300 mg hs and still on prednisone 10 mg daily   Chief Complaint  Patient presents with  . Follow-up    Cough has improved some.   Dyspnea:  Walking up to  30 min daily Cough: better esp while on gabapentin / min mucoid Sleeping: fine flat one pillow SABA use: very little rec Work on inhaler technique:   Try prednisone 10 mg one half daily - if worse then start back at 20 mg daily until better, then 10 mg per day for a week then back to 5 mg daily     12/15/2018  f/u ov/Wert re: pf/cough variant asthma prednisone 5 mg daily/  Gabapentin 200 in am and 300 at hs // omeprazole 20 mg bid ac  Chief Complaint  Patient presents with  . Follow-up     Breathing is better and less cough in the am. Cough still bothers her when she walks.   Dyspnea:  Walking up to 30 min s stopping  Cough: better but just when starts walking or hot air exp, not noct, occurs always on deep insp Sleeping: no problem lying flat one pillow  SABA use: none 02: not / not monitoring    No obvious day to day or daytime variability or assoc excess/ purulent sputum or mucus plugs or hemoptysis or cp or chest tightness, subjective wheeze or overt sinus or hb symptoms.   Sleeping as above without nocturnal  or early am exacerbation  of respiratory  c/o's or need for noct saba. Also denies any obvious fluctuation of symptoms with weather or environmental changes or other aggravating or alleviating factors except as outlined above   No unusual exposure hx or h/o childhood pna/ asthma or knowledge of premature birth.  Current Allergies, Complete Past Medical History, Past Surgical History, Family History, and Social History were reviewed in [employer] record.  ROS  The following are not active complaints unless bolded Hoarseness, sore throat, dysphagia, dental problems, itching, sneezing,  nasal congestion or discharge of excess mucus or purulent secretions, ear ache,   fever, chills, sweats, unintended wt loss or wt gain, classically pleuritic or exertional cp,  orthopnea pnd or arm/hand swelling  or leg swelling, presyncope, palpitations, abdominal pain, anorexia, nausea, vomiting, diarrhea  or change in bowel habits or change in bladder habits, change in stools or change in urine, dysuria, hematuria,  rash, arthralgias, visual complaints, headache, numbness, weakness or ataxia or problems with walking or coordination,  change in mood or  memory.        Current Meds  Medication Sig  . albuterol (VENTOLIN HFA) 108 (90 Base) MCG/ACT inhaler Inhale 2 puffs into the lungs every 6 (six) hours as needed for wheezing or shortness of breath.  . gabapentin  (NEURONTIN) 100 MG capsule One each am  . gabapentin (NEURONTIN) 300 MG capsule Take 1 capsule (300 mg total) by mouth at bedtime.  . mometasone-formoterol (DULERA) 100-5 MCG/ACT AERO Inhale 2 puffs into the lungs 2 (two) times a day.  Marland Kitchen omeprazole (PRILOSEC) 20 MG capsule Take 30- 60 min before your first and last meals of the day  . predniSONE (DELTASONE) 10 MG tablet Take one half daily  Objective:    amb latina nad   12/15/2018       162  10/27/2018       164  09/01/2018       160  08/04/18 158 lb (71.7 kg)  05/29/18 156 lb 6.4 oz (70.9 kg)  03/20/18 159 lb (72.1 kg)    Vital signs reviewed - Note on arrival 02 sats  98% on RA        HEENT: nl dentition, turbinates bilaterally, and oropharynx. Nl external ear canals without cough reflex   NECK :  without JVD/Nodes/TM/ nl carotid upstrokes bilaterally   LUNGS: no acc muscle use,  Nl contour chest with min insp crackles bilaterally without cough on insp or exp maneuvers   CV:  RRR  no s3 or murmur or increase in P2, and no edema   ABD:  soft and nontender with nl inspiratory excursion in the supine position. No bruits or organomegaly appreciated, bowel sounds nl  MS:  Nl gait/ ext warm without deformities, calf tenderness, cyanosis - mild  clubbing No obvious joint restrictions   SKIN: warm and dry without lesions    NEURO:  alert, approp, nl sensorium with  no motor or cerebellar deficits apparent.                  Assessment

## 2018-12-16 ENCOUNTER — Telehealth: Payer: Self-pay

## 2018-12-16 NOTE — Telephone Encounter (Signed)
Health coaching 2   Interpreter- Rudene Anda, [employer]   Labs- 212 cholesterol , 139 LDL cholesterol , 102 triglycerides , 53 HDL cholesterol , 5.5 hemoglobin A1C , 89 mean plasma glucose  Patient understands and is aware of her lab results.   Goals- Spoke with patient about lab results. Answered any questions that patient had regarding results. Informed patient that since her blood pressure was elevated during initial visit and cholesterol was slightly elevated I would be referring her to [facility] for follow-up  Health Coaching Goals- Patient will try and add more fruits and vegetables into diet, 2 fruits and 3 vegetables per day. Heart healthy fish like salmon and tuna 2 times a week. Healthy fats including olive oil, nuts and avocados. Avoiding fried and fatty foods. Encouraged patient to continue walking for 20-30 minutes daily.   Navigation:  Patient is aware of 1 more health coaching sessions and a follow up. Patient is scheduled for follow-up with [facility] on August 17th @ 9:15 am.  Time- 13 minutes

## 2018-12-17 ENCOUNTER — Encounter: Payer: Self-pay | Admitting: Internal Medicine

## 2018-12-17 NOTE — Assessment & Plan Note (Addendum)
Onset Aug 2019  neg resp to rx with short term pred/ flovent or saba  -  08/04/2018 trial of max rx for gerd plus pred 20 until better then 10 mg daily as occurring in setting of PF w/u in progress  - 09/01/2018   try dulera 100 2bid  - 09/01/2018 added gabapentin 100 mg tid  - 09/29/2018 still throat clearing /cough > rec 300 mg tid trial > could only tolerate 300 mg qhs as of 10/27/2018  - 10/27/2018  After extensive coaching inhaler device,  effectiveness =    25% so needs spacer / recoaching  - 12/15/2018  After extensive coaching inhaler device,  effectiveness =    75% with spacer    No change rx needed   I had an extended discussion with the patient reviewing all relevant studies completed to date and  lasting 15 to 20 minutes of a 25 minute visit    I performed detailed device teaching using a teach back method which extended face to face time for this visit (see above)  Each maintenance medication was reviewed in detail including emphasizing most importantly the difference between maintenance and prns and under what circumstances the prns are to be triggered using an action plan format that is not reflected in the computer generated alphabetically organized AVS which I have not found useful in most complex patients, especially with respiratory illnesses  Please see AVS for specific instructions unique to this visit that I personally wrote and converted into spanish using [employer] tranlator  and verbalized to the the pt in detail and then reviewed with pt  by my nurse highlighting any  changes in therapy recommended at today's visit to their plan of care.

## 2018-12-17 NOTE — Assessment & Plan Note (Signed)
Onset of symptoms Aug 2019  Chest HRCT  06/18/2018  highly limited by respiratory motion. With these limitations in mind, there is evidence suggestive of interstitial lung disease considered indeterminate for usual interstitial pneumonia (UIP)   - 08/04/2018   Walked RA  2 laps @  approx 250ft each @ fast pace  stopped due to  End of study with sob and sats down to 82% - see ex hypoxemia - Collagen Vasc profile 08/04/2018  Nl RA, ANA, ESR=24  - [facility] serology 09/01/2018  Neg  - on pred 5 mg /day :  12/15/2018   Walked RA  2 laps @  approx 250ft each @ avg pace  stopped due to  End of study, min sob, sats 93% at end    No evidence of UIP clinically and even if so, improving sats are very good prognostic sign so will rec continue pred at 5 mg daily  

## 2018-12-21 ENCOUNTER — Ambulatory Visit (INDEPENDENT_AMBULATORY_CARE_PROVIDER_SITE_OTHER): Payer: No Typology Code available for payment source | Admitting: Internal Medicine

## 2018-12-21 ENCOUNTER — Other Ambulatory Visit: Payer: Self-pay

## 2018-12-21 ENCOUNTER — Encounter: Payer: Self-pay | Admitting: Internal Medicine

## 2018-12-21 DIAGNOSIS — E785 Hyperlipidemia, unspecified: Secondary | ICD-10-CM

## 2018-12-21 DIAGNOSIS — R030 Elevated blood-pressure reading, without diagnosis of hypertension: Secondary | ICD-10-CM

## 2018-12-21 HISTORY — DX: Elevated blood-pressure reading, without diagnosis of hypertension: R03.0

## 2018-12-21 NOTE — Assessment & Plan Note (Signed)
Dyslipidemia: Recent lipid panel showed total cholesterol of 212 and LDL of 139.  Her calculated ASCVD score is 1.1%  Plan: - No indication to start lipid-lowering medication today -Advised on lifestyle modification

## 2018-12-21 NOTE — Patient Instructions (Signed)
*  This AVS was translated into Spanish using an interpreter*  Olivia [name],   It was a pleasure taking care of you in the clinic today.  Your blood pressure is doing really well and today it is 122/80.  Even though your cholesterol was elevated when it was last checked, your overall risk for heart event or stroke is very low and I would not start you on any medication today.  Please continue eating well and exercising as you have been doing.  Take care! Dr. Eileen Stanford  Please call the internal medicine center clinic if you have any questions or concerns, we may be able to help and keep you from a long and expensive emergency room wait. Our clinic and after hours phone number is [phone], the best time to call is Monday through Friday 9 am to 4 pm but there is always someone available 24/7 if you have an emergency. If you need medication refills please notify your pharmacy one week in advance and they will send [place] a request.

## 2018-12-21 NOTE — Progress Notes (Signed)
   CC: Evaluation for "high blood pressure."  HPI:  [name] is a 53 y.o. very pleasant Spanish-speaking woman with medical history listed below presenting for evaluation of elevated blood pressure at her prior clinic visit.  Please see problem based charting for further details.  Past Medical History:  Diagnosis Date  . Anemia   . Arthritis   . GERD (gastroesophageal reflux disease)    Review of Systems: As per HPI  Physical Exam:  Vitals:   12/21/18 0939  BP: 122/80  Pulse: 78  Temp: 98.3 F (36.8 C)  TempSrc: Oral  SpO2: 97%  Weight: 162 lb 9.6 oz (73.8 kg)  Height: 4\' 9"  (1.448 m)   Physical Exam  Constitutional: She is well-developed, well-nourished, and in no distress.  Eyes: Conjunctivae are normal.  Cardiovascular: Normal rate, regular rhythm and normal heart sounds.  No murmur heard. Pulmonary/Chest: Effort normal. No respiratory distress. She has no wheezes. She has no rales.  Musculoskeletal:        General: No edema.  Psychiatric: Mood and affect normal.    Assessment & Plan:   See Encounters Tab for problem based charting.  Patient discussed with Dr. Dareen Piano

## 2018-12-21 NOTE — Assessment & Plan Note (Signed)
"  Prior elevated BP:" Olivia [name] was evaluated at the [facility] clinic on December 09, 2018 and was noted to have elevated BP on 2 readings with range 130s-150s/80s-90s.  She was subsequently asked to follow-up with her PCP.  Today, her blood pressure is unremarkable at 122/80.  She denies any cardiopulmonary symptoms and states that she eats a well-balanced diet.  Though she does not work, she states that she continues to do some house chores and walk about 13 minutes a day for 5 days.  BP Readings from Last 3 Encounters:  12/21/18 122/80  12/15/18 134/84  12/09/18 (!) 146/94   Plan: - No indication to start antihypertensive today -Advised on lifestyle modifications

## 2018-12-24 NOTE — Progress Notes (Signed)
Internal Medicine Clinic Attending  Case discussed with Dr. Agyei at the time of the visit.  We reviewed the resident's history and exam and pertinent patient test results.  I agree with the assessment, diagnosis, and plan of care documented in the resident's note.    

## 2019-03-03 ENCOUNTER — Telehealth: Payer: Self-pay

## 2019-03-03 NOTE — Telephone Encounter (Signed)
Health Coaching 3  Interpreter- Rudene Anda   Goals- Patient states that she has been working on her diet and exercise. Patient has been trying to reduce the amount of "sweet bread" that she consumes but says that she still eats it often. I encouraged patient to work on how often she is eating these sweets.    New goal- Patient states that she wants to exercise more but has not been able to because of issues with coughing when she goes outside. Patient stated that she would like to get a treadmill for inside her house.   Barrier to reaching goal- Patient has had trouble with exercises because of ongoing issues with her lungs and coughing. Encouraged patient to try and do what exercise she can but not to over do it. Patient feels that she would be able to exercise better indoors. Encouraged her to do so.   Strategies to overcome- Patient is going to work on getting a treadmill for inside her home.  Patient also stated that she has taken her BP several times since her follow-up visit for elevated BP and it has been high. Patient feels like her BP is elevated because of the medication that she is taking for her chronic cough/lung issues. Patient has an appointment with [facility] in a few weeks. I encouraged her to follow-up with her pulmonologist about these concerns. Also informed patient that if her BP is elevated when she comes for her follow-up visit I would refer her back to Internal Medicine for follow-up.    Navigation:  Patient is aware of  a follow up session. Patient is scheduled for follow-up visit on November 30th @ 3:00 pm.   Time- 20 minutes

## 2019-03-23 ENCOUNTER — Other Ambulatory Visit: Payer: Self-pay

## 2019-03-23 ENCOUNTER — Ambulatory Visit: Payer: No Typology Code available for payment source | Admitting: Internal Medicine

## 2019-03-23 ENCOUNTER — Encounter: Payer: Self-pay | Admitting: Internal Medicine

## 2019-03-23 DIAGNOSIS — R053 Chronic cough: Secondary | ICD-10-CM

## 2019-03-23 DIAGNOSIS — R05 Cough: Secondary | ICD-10-CM

## 2019-03-23 DIAGNOSIS — R918 Other nonspecific abnormal finding of lung field: Secondary | ICD-10-CM

## 2019-03-23 NOTE — Patient Instructions (Addendum)
Keep walking as much as you can   Try taking prednisone 10 mg one half on even days only to see if it makes a difference  Reduce your Dulera to one puff twice dialy   GERD (REFLUX)  is an extremely common cause of respiratory symptoms just like yours , many times with no obvious heartburn at all.    It can be treated with medication, but also with lifestyle changes including elevation of the head of your bed (ideally with 6 -8inch blocks under the headboard of your bed),  Smoking cessation, avoidance of late meals, excessive alcohol, and avoid fatty foods, chocolate, peppermint, colas, red wine, and acidic juices such as orange juice.  NO MINT OR MENTHOL PRODUCTS SO NO COUGH DROPS  USE SUGARLESS CANDY INSTEAD (Jolley ranchers or Stover's or Life Savers) or even ice chips will also do - the key is to swallow to prevent all throat clearing. NO OIL BASED VITAMINS - use powdered substitutes.  Avoid fish oil when coughing.    Please schedule a follow up visit in 3 months but call sooner if needed     Sigue caminando tanto como puedas  Intente tomar prednisona 10 mg la [employer] pares solo para ver si hace una diferencia  Reduzca su Dulera a una bocanada dos veces al da  GERD (REFLUX) es una causa extremadamente comn de sntomas respiratorios como los suyos, [place] veces sin acidez [place].   Puede tratarse con medicamentos, pero tambin con cambios en el estilo de vida, incluida la elevacin de la cabecera de la cama (idealmente con bloques de 6 a 8 pulgadas debajo de la cabecera de la cama), dejar de fumar, evitar las comidas tardas, consumir alcohol en exceso y Product/process development scientist los alimentos grasos. , chocolate, menta, colas, vino tinto y jugos cidos como el jugo de [place]. SIN MENTA NI PRODUCTOS DE MENTOL, AS QUE SIN GOTAS PARA LA TOS UTILICE CARAMELOS SIN AZCAR EN SU LUGAR (Jolley ranchers o Stover's o Life Savers) o incluso trocitos de hielo tambin servirn; la clave es tragar  para evitar que se carraspee. SIN VITAMINAS A BASE DE ACEITE - use sustitutos en polvo. Evite el aceite de pescado al toser.    Programe una visita de seguimiento en 3 meses, pero llame antes si es necesario

## 2019-03-23 NOTE — Assessment & Plan Note (Addendum)
Onset Aug 2019  neg resp to rx with short term pred/ flovent or saba  -  08/04/2018 trial of max rx for gerd plus pred 20 until better then 10 mg daily as occurring in setting of PF w/u in progress  - 09/01/2018   try dulera 100 2bid  - 09/01/2018 added gabapentin 100 mg tid  - 09/29/2018 still throat clearing /cough > rec 300 mg tid trial > could only tolerate 300 mg qhs as of 10/27/2018  - 10/27/2018  After extensive coaching inhaler device,  effectiveness =    25% so needs spacer / recoaching  - 12/15/2018  After extensive coaching inhaler device,  effectiveness =    75% with spacer  - 03/23/2019  After extensive coaching inhaler device,  effectiveness =    90% so try dulera 100 one bid    Still clearing throat so may benefit from lowering dulera rx and also reviewed diet/ need for non-mint/menthol candies to reduce urge to clear her throat    I had an extended discussion with the patient reviewing all relevant studies completed to date and  lasting 15 to 20 minutes of a 25 minute visit    I  directly observed portions of ambulatory 02 saturation study/   I performed detailed device teaching using a teach back method which extended face to face time for this visit (see above)  Each maintenance medication was reviewed in detail including emphasizing most importantly the difference between maintenance and prns and under what circumstances the prns are to be triggered using an action plan format that is not reflected in the computer generated alphabetically organized AVS which I have not found useful in most complex patients, especially with respiratory illnesses  Please see AVS for specific instructions unique to this visit that I personally wrote and verbalized to the the pt(via interpreter)  in detail and then reviewed in [place] with pt  by my nurse via interpreter  highlighting any  changes in therapy recommended at today's visit to their plan of care.

## 2019-03-23 NOTE — Assessment & Plan Note (Addendum)
Onset of symptoms Aug 2019  Chest HRCT  06/18/2018  highly limited by respiratory motion. With these limitations in mind, there is evidence suggestive of interstitial lung disease considered indeterminate for usual interstitial pneumonia (UIP)   - 08/04/2018   Walked RA  2 laps @  approx 230f each @ fast pace  stopped due to  End of study with sob and sats down to 82% - see ex hypoxemia - Collagen Vasc profile 08/04/2018  Nl RA, ANA, ESR=24  - HSP serology 09/01/2018  Neg  - on pred 5 mg /day :  12/15/2018   Walked RA  2 laps @  approx 2555feach @ avg pace  stopped due to  End of study, min sob, sats 93% at end   - 03/23/2019   Walked RA x one lap =  approx 250 ft @ nl pace - stopped due to end of study with sats of 96% at the end of the study and minimal sob on prednisone 5 mg qd so rec qod   The goal with a chronic steroid dependent illness is always arriving at the lowest effective dose that controls the disease/symptoms and not accepting a set "formula" which is based on statistics or guidelines that don't always take into account patient  variability or the natural hx of the dz in every individual patient, which may well vary over time.  For now therefore I recommend the patient maintain  5 mg qod if tol, if not can resume 5 mg daily

## 2019-03-23 NOTE — Progress Notes (Signed)
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 Pulmonary Consult    Referred by Dr Frederico Hamman. Pt c/o SOB since August 2019. She gets SOB walking short distances and also when she makes the bed.   Dyspnea:  Indolent onset doe summer 2019   Now [facility] = can't walk a nl pace on a flat grade s sob but does fine slow and flat  Cough: to point of vomiting / mucus is clear  Sleep: flat bed and one pillow nonoct cough  SABA use: don't help/ neither does flovent or tessalon prn  No significant arthritis but feels better/ cough less p advil  2 dogs/ no birds  No h/o rheumatologic symptoms, chemo or amiodarone or macrodantin exp. rec GERD diet  Omeprazole 20 mg Take 30- 60 min before your first and last meals of the day  Prednisone 10 mg x 2 daily until better then 1 daily until return  Please schedule a follow up office visit in 4 weeks, sooner if needed  with all medications /inhalers/ solutions in hand Please bring a family member to help interpret at this visit, thank you    09/01/2018  f/u ov/Olivia [name] re:  PF with gg changes / rx pred 20  Chief Complaint  Patient presents with  . Follow-up    Breathing is some better. She is still taking 20 mg prednisone. She is using her ventolin once daily on average.   Dyspnea:  50-100 ft  Cough: still cough on insp/ no more vomiting Sleeping: able to sleep flat /one  SABA use: helps sob and cough / qvar does not 02: none  rec Plan A = Automatic = dulera 100 Take 2  puffs first thing in am and then another 2 puffs about 12 hours later. Gabapentin 100 mg three times a day  Work on inhaler technique:   Plan B = Backup Only use your albuterol inhaler as a rescue medication   Please remember to go to the lab department   for your tests - we will call you with the results when they are available. Continue omeprazole 20 mg Take 30- 60 min before your first and last meals of the day  GERD diet    09/29/2018  f/u ov/Olivia [name] re:  PF / asthma improved on prednisone 10 mg qd  Chief Complaint  Patient presents with  . Follow-up    Breathing is doing well today. She has not had to use her rescue inhaler. She has had cough with clear sputum.    Dyspnea:  Walk up 30 min around neighborhood / trouble on [place] only  Cough: dry /cough with mopping / deep breathing/ none noct  Sleeping: fine at bedtime flat/one pillo2 SABA use: none  02: none  rec Dulera 100 Take 2 puffs first thing in am and then another 2 puffs about 12 hours later.  Work on inhaler technique:  Increase gabapentin to 300 mg  three times daily  Prednisone 10 mg one on even, one half on odd days  Please schedule a follow up office visit in 4 weeks, sooner if needed     10/27/2018  f/u ov/Olivia [name] re: pf/ cough variant asthma with uacs  On gabapentin 300 mg hs and still on prednisone 10 mg daily   Chief Complaint  Patient presents with  . Follow-up    Cough has improved some.   Dyspnea:  Walking up to  30 min daily Cough: better esp while on gabapentin / min mucoid Sleeping: fine flat one pillow SABA use: very little rec Work on inhaler technique:   Try prednisone 10 mg one half daily - if worse then start back at 20 mg daily until better, then 10 mg per day for a week then back to 5 mg daily     12/15/2018  f/u ov/Olivia [name] re: pf/cough variant asthma prednisone 5 mg daily/  Gabapentin 200 in am and 300 at hs // omeprazole 20 mg bid ac  Chief Complaint  Patient presents with  . Follow-up     Breathing is better and less cough in the am. Cough still bothers her when she walks.   Dyspnea:  Walking up to 30 min s stopping  Cough: better but just when starts walking or hot air exp, not noct, occurs always on deep insp Sleeping: no problem lying flat one pillow  SABA use: none 02: not / not monitoring  rec Try again to take gabapentin to 300 mg twice  Daily if you can - if not, resume previous dose No change on prednisone dose for now = 5 mg daily  Ideally need to track 02 levels at peak activity at least once a week to monitor your lung condition Please schedule a follow up visit in 3 months but call sooner if needed    03/23/2019  f/u ov/Olivia [name] re:  PF/ cough variant asthma vs uacs on pred 5 mg daily and dulera 100 2bid  Chief Complaint  Patient presents with  . Follow-up    Breathing is overall doing better but she does notice some SOB when she wakes up in the morning. She has minimal cough.   Dyspnea:  Not as much with colder weather  Cough: better Sleeping:  No resp problems - one pillow  SABA use:  02:  nonne   No obvious day to day or daytime variability or assoc excess/ purulent sputum or mucus plugs or hemoptysis or cp or chest tightness, subjective wheeze or overt sinus or hb symptoms.   Sleeping  without nocturnal  or early am exacerbation  of respiratory  c/o's or need for noct saba. Also denies any obvious fluctuation of symptoms with weather or environmental changes or other aggravating or alleviating factors except as outlined above   No unusual exposure hx or h/o childhood pna/ asthma or knowledge of premature birth.  Current Allergies, Complete Past Medical History, Past Surgical History, Family History, and Social History were reviewed in [employer] record.  ROS  The following are not active complaints unless bolded Hoarseness, sore throat, dysphagia, dental problems, itching, sneezing,  nasal congestion or discharge of excess mucus or  purulent secretions, ear ache,   fever, chills, sweats, unintended wt loss or wt gain minimal , classically pleuritic or exertional cp,  orthopnea pnd or arm/hand swelling  or leg/ foot swelling minimal, presyncope, palpitations, abdominal pain, anorexia, nausea, vomiting, diarrhea  or change in bowel habits or change in  bladder habits, change in stools or change in urine, dysuria, hematuria,  rash, arthralgias, visual complaints, headache, numbness, weakness or ataxia or problems with walking or coordination,  change in mood or  memory.        Current Meds  Medication Sig  . albuterol (VENTOLIN HFA) 108 (90 Base) MCG/ACT inhaler Inhale 2 puffs into the lungs every 6 (six) hours as needed for wheezing or shortness of breath.  . gabapentin (NEURONTIN) 100 MG capsule One each am  . mometasone-formoterol (DULERA) 100-5 MCG/ACT AERO Inhale 2 puffs into the lungs 2 (two) times a day.  Marland Kitchen omeprazole (PRILOSEC) 20 MG capsule Take 30- 60 min before your first and last meals of the day  . predniSONE (DELTASONE) 10 MG tablet Take one half daily                  Objective:    amb latina nad    03/23/2019     164  12/15/2018       162  10/27/2018       164  09/01/2018       160  08/04/18 158 lb (71.7 kg)  05/29/18 156 lb 6.4 oz (70.9 kg)  03/20/18 159 lb (72.1 kg)     BP 124/82 (BP Location: Left Arm, Cuff Size: Normal)   Pulse 86   Temp 98.1 F (36.7 C) (Temporal)   Ht 4\' 9"  (1.448 m)   Wt 164 lb (74.4 kg)   SpO2 97% Comment: on RA  BMI 35.49 kg/m       HEENT : pt wearing mask not removed for exam due to covid -19 concerns.    NECK :  without JVD/Nodes/TM/ nl carotid upstrokes bilaterally   LUNGS: no acc muscle use,  Nl contour chest which is clear to A and P bilaterally without cough on insp or exp maneuvers   CV:  RRR  no s3 or murmur or increase in P2, and no edema   ABD:  soft and nontender with nl inspiratory excursion in the supine position. No bruits or organomegaly  appreciated, bowel sounds nl  MS:  Nl gait/ ext warm without deformities, calf tenderness, cyanosis  -  mild  clubbing No obvious joint restrictions   SKIN: warm and dry without lesions    NEURO:  alert, approp, nl sensorium with  no motor or cerebellar deficits apparent.              Assessment

## 2019-04-05 ENCOUNTER — Inpatient Hospital Stay: Payer: Self-pay | Attending: Obstetrics and Gynecology | Admitting: *Deleted

## 2019-04-05 ENCOUNTER — Other Ambulatory Visit: Payer: Self-pay

## 2019-04-05 VITALS — BP 131/84 | Temp 98.0°F | Ht 60.0 in | Wt 165.0 lb

## 2019-04-05 DIAGNOSIS — Z0000 Encounter for general adult medical examination without abnormal findings: Secondary | ICD-10-CM

## 2019-04-05 NOTE — Progress Notes (Signed)
[facility] follow up  Interpreter: Rudene Anda, [employer]   Clinical Measurement:  Height: 60 in Weight: 165 lb  Blood Pressure: 140/90  Blood Pressure #2: 131/84    Medical History:  Patient states that she does have a history of high cholesterol and high blood pressure. Patient does not have diabetes.   Medications:  Patient states that she does not take medication to lower cholesterol, blood pressure or blood sugar. Patient does not take an aspirin a day to help prevent a heart attack or stroke.   Blood pressure, self measurement:  Patient states that she measures her blood pressure from home daily and shares her readings with a health care provider.    Nutrition:  Patient states that on average she eats 2 cups of fruit and 3 cups of vegetables per day. Patient states that she does not eat fish at least 2 times per week. In a typical day patient eats less than half servings of whole grains. Patient drinks less than 36 ounces of beverages with added sugar weekly. Patient is currently watching sodium or salt intake. In the past 7 days patient has not had any drinks containing alcohol. On average patient does not drink any drinks containing alcohol.       Physical activity:  Patient states that she gets  0 minutes of moderate and 0 minutes of vigorous physical activity each week.  Smoking status:  Patient states that she has never smoked tobacco.   Quality of life:  Over the past 2 weeks patient states that she has had 0 days where she has little interest or pleasure in doing things and 0 days where she has felt down, depressed or hopeless.   Health Coaching: Spoke with patient about ways to get exercise in her daily routine. Patient has a lung condition that prevents her from doing strenuous exercising. Patient was walking outside until the weather turned colder. Encouraged patient to look on YouTube for chair exercise videos. This would be a good way for the patient to be able to exercise  without too much strenuous activity.    Navigation: This was the  follow up session for this patient, I will check up on her progress in the coming months. Will call patient with follow-up appointment information for Internal Medicine for elevated BP.  Time: 20 minutes

## 2019-04-09 ENCOUNTER — Encounter: Payer: Self-pay | Admitting: Internal Medicine

## 2019-04-09 ENCOUNTER — Ambulatory Visit: Payer: No Typology Code available for payment source | Admitting: Internal Medicine

## 2019-04-09 VITALS — BP 121/94 | HR 80 | Temp 98.2°F | Ht <= 58 in | Wt 164.6 lb

## 2019-04-09 DIAGNOSIS — Z7952 Long term (current) use of systemic steroids: Secondary | ICD-10-CM

## 2019-04-09 DIAGNOSIS — R030 Elevated blood-pressure reading, without diagnosis of hypertension: Secondary | ICD-10-CM

## 2019-04-09 DIAGNOSIS — J84112 Idiopathic pulmonary fibrosis: Secondary | ICD-10-CM

## 2019-04-09 NOTE — Progress Notes (Signed)
   CC: Elevated BP  HPI:  Olivia [name] is a 53 y.o. year-old female with PMH listed below who presents to clinic for elevated BP. Please see problem based assessment and plan for further details.   Past Medical History:  Diagnosis Date  . GERD (gastroesophageal reflux disease)    Review of Systems:   Review of Systems  Respiratory: Positive for cough and shortness of breath.   Cardiovascular: Negative for chest pain and leg swelling.  Neurological: Positive for headaches.    Physical Exam:  Vitals:   04/09/19 0938  BP: (!) 121/94  Pulse: 80  Temp: 98.2 F (36.8 C)  TempSrc: Oral  SpO2: 98%  Weight: 164 lb 9.6 oz (74.7 kg)  Height: 4\' 9"  (1.448 m)    General: well-appearing female in NAD  Cardiac: regular rate and rhythm, nl S1/S2, no murmurs, rubs or gallops Pulm: CTAB, no wheezes or crackles, no increased work of breathing on room air     Assessment & Plan:   See Encounters Tab for problem based charting.  Patient discussed with Dr. Angelia Mould

## 2019-04-09 NOTE — Assessment & Plan Note (Signed)
Patient presents for evaluation of elevated blood pressure that was noted at a [facility] appointment. BP today 124/90. She does report her BP has been elevated at home in the 140s/100s when she checks it with her wrist cuff, but BP seems normal at her appointments. She is on chronic low dose prednisone for UIP and is at high risk of developing HTN but I do not believe she is hypertensive as her BP are consistently normal at her doctor's appointments. I asked her to buy an elbow BP cuff and check her BP for the next 2 weeks with the new cuff. Will call then to see how she is doing.

## 2019-04-09 NOTE — Patient Instructions (Addendum)
Olivia [name],   Su presion esta bien hoy. Le recomiendo que de ahora en adelante se tome la presion al nivel del codo. Tome nota de su presion por la siguiente 2 semanas. En 2 semanas la llamare para checar como esta su presion.   Llamenos si tiene cualquier duda o pregunta.   - Dra. Frederico Hamman

## 2019-04-12 NOTE — Progress Notes (Signed)
Internal Medicine Clinic Attending  Case discussed with Dr. Santos-Sanchez at the time of the visit.  We reviewed the resident's history and exam and pertinent patient test results.  I agree with the assessment, diagnosis, and plan of care documented in the resident's note.    

## 2019-04-16 ENCOUNTER — Ambulatory Visit: Payer: No Typology Code available for payment source

## 2019-04-26 ENCOUNTER — Ambulatory Visit (INDEPENDENT_AMBULATORY_CARE_PROVIDER_SITE_OTHER): Payer: No Typology Code available for payment source | Admitting: Internal Medicine

## 2019-04-26 ENCOUNTER — Other Ambulatory Visit: Payer: Self-pay

## 2019-04-26 DIAGNOSIS — R030 Elevated blood-pressure reading, without diagnosis of hypertension: Secondary | ICD-10-CM

## 2019-04-27 ENCOUNTER — Encounter: Payer: Self-pay | Admitting: Internal Medicine

## 2019-04-27 NOTE — Progress Notes (Signed)
  [facility] Internal Medicine Residency Telephone Encounter Continuity Care Appointment  HPI:   This telephone encounter was created for [name] on 04/27/2019 for the following purpose/cc elevated BP follow up.   Past Medical History:  Past Medical History:  Diagnosis Date  . GERD (gastroesophageal reflux disease)       ROS:   Denies chest pain, shortness of breath, lower extremity swelling, and headaches.   Assessment / Plan / Recommendations:   Please see A&P under problem oriented charting for assessment of the patient's acute and chronic medical conditions.   As always, pt is advised that if symptoms worsen or new symptoms arise, they should go to an urgent care facility or to to ER for further evaluation.   Consent and Medical Decision Making:   Patient discussed with Dr. Evette Doffing  This is a telephone encounter between [facility] and Olivia [name] on 04/27/2019 for elevated BP follow up. The visit was conducted with the patient located at home and Welford Roche at [facility]. The patient's identity was confirmed using their DOB and current address. The patient has consented to being evaluated through a telephone encounter and understands the associated risks (an examination cannot be done and the patient may need to come in for an appointment) / benefits (allows the patient to remain at home, decreasing exposure to coronavirus). I personally spent 7 minutes on medical discussion.

## 2019-04-27 NOTE — Progress Notes (Signed)
Internal Medicine Clinic Attending  Case discussed with Dr. Santos-Sanchez at the time of the visit.  We reviewed the resident's history and exam and pertinent patient test results.  I agree with the assessment, diagnosis, and plan of care documented in the resident's note.    

## 2019-04-27 NOTE — Assessment & Plan Note (Addendum)
This is a telehealth visit for elevated BP follow-up.  Patient was getting a mammogram 4 weeks ago and BP was elevated at 140/90.  She was recommended to follow with her PCP.  At her follow-up visit 2 weeks ago her BP was normal 124/85.  Since then she has been doing well.  She has been checking her BP with her wrist cuff at home and all the readings have been < 130/80.  No further intervention at this time.  We will continue to monitor BP at future visits.

## 2019-05-03 ENCOUNTER — Ambulatory Visit: Payer: HRSA Program | Attending: Internal Medicine

## 2019-05-03 DIAGNOSIS — U071 COVID-19: Secondary | ICD-10-CM | POA: Insufficient documentation

## 2019-05-03 DIAGNOSIS — Z20822 Contact with and (suspected) exposure to covid-19: Secondary | ICD-10-CM

## 2019-05-03 DIAGNOSIS — Z20828 Contact with and (suspected) exposure to other viral communicable diseases: Secondary | ICD-10-CM | POA: Diagnosis present

## 2019-05-05 LAB — NOVEL CORONAVIRUS, NAA: SARS-CoV-2, NAA: DETECTED — AB

## 2019-05-06 ENCOUNTER — Encounter: Payer: Self-pay | Admitting: Infectious Diseases

## 2019-05-17 ENCOUNTER — Telehealth: Payer: Self-pay | Admitting: Internal Medicine

## 2019-05-17 MED ORDER — ALBUTEROL SULFATE HFA 108 (90 BASE) MCG/ACT IN AERS: 2.0000 | INHALATION_SPRAY | Freq: Four times a day (QID) | RESPIRATORY_TRACT | 5 refills | Status: DC | PRN

## 2019-05-17 NOTE — Telephone Encounter (Signed)
Refill of Albuterol sent to pharmacy and patient has been made aware.

## 2019-05-28 ENCOUNTER — Ambulatory Visit: Payer: Self-pay | Attending: Internal Medicine

## 2019-05-28 DIAGNOSIS — Z20822 Contact with and (suspected) exposure to covid-19: Secondary | ICD-10-CM | POA: Insufficient documentation

## 2019-05-29 LAB — NOVEL CORONAVIRUS, NAA: SARS-CoV-2, NAA: NOT DETECTED

## 2019-06-14 ENCOUNTER — Ambulatory Visit: Payer: No Typology Code available for payment source

## 2019-06-24 ENCOUNTER — Ambulatory Visit: Payer: No Typology Code available for payment source | Admitting: Internal Medicine

## 2019-06-27 NOTE — Progress Notes (Signed)
_0  ID: Olivia [name], female    DOB: December 11, 1965, 54 y.o.   MRN: [id number]  Chief Complaint  Patient presents with  . Follow-up    3 month f/u for possible ILD. States since covid she has been coughing up more phlegm. Notices an increase in SOB in the mornings. O2 has been running 92%-96% at home.     Referring provider: Welford Roche,*  HPI:  54 year old female never smoker followed in our office for interstitial lung disease.  Patient was referred to our office due to an abnormal CT in March/2020.  PMH: Dyslipidemia Smoker/ Smoking History: Never smoker Maintenance: Dulera 100 Patient of Dr. Melvyn Novas  06/28/2019  - Visit   54 year old female never smoker initially referred to our office in March/2020 due to an abnormal CT chest found to have interstitial lung disease.  Difficult to fully interpret initial high-resolution CT chest due to respiratory movement.  Recommendations were to repeat high-resolution CT chest in February/2021.  Patient still needs pulmonary function testing.   Patient reports she has been doing well since last being seen.  She did contract COVID-19 in December/2020.  She did not require hospitalization.  She did not receive the monoclonal antibody infusion.  Patient still needs pulmonary function testing.  She needs a repeat high-resolution CT chest in February/2021.  She is due for flu vaccine.  She is wondering today if she would be a good candidate for the COVID-19 vaccine if and when it is available to her   Questionaires / Pulmonary Flowsheets:   MMRC: mMRC Dyspnea Scale mMRC Score  06/28/2019 0    Tests:   05/03/2019-SARS-CoV-2-positive  09/01/2018-hypersensitivity pneumonitis panel-negative  08/04/2018-sed rate-24 08/04/2018-ANA-negative 08/04/2018-rheumatoid factor-less than 14  06/17/2018-CT chest high-res-study is highly limited due to respiratory motion, there is evidence suggestive of interstitial lung disease  considered indeterminate for UIP per ATS consensus guidelines, outpatient referral to pulmonary for further evaluation is strongly recommended, follow-up high-resolution CT chest is recommended in 12 months to assess for temporal changes in the appearance of the lung parenchyma.  Aortic arthrosclerosis  FENO:  No results found for: NITRICOXIDE  PFT: No flowsheet data found.  WALK:  SIX MIN WALK 06/28/2019 03/23/2019 12/15/2018 10/27/2018 09/29/2018 09/01/2018 08/04/2018  Supplimental Oxygen during Test? (L/min) - _1  No  Tech Comments: Patient was able to complete all 3 laps at a moderate pace with a forehead probe. Denied any SOB, chest pain or leg pain during walk. No O2 needed during or after walk. moderate pace/min SOB//lmr average to moderate pace/very min SOB/no cough//lmr average pace/min SOB and cough//lmr fast pace/SOB/cough//lmr faster than average pace/min SOB//lmr faster than average pace/SOB//lmr    Imaging: No results found.  Lab Results:  CBC    Component Value Date/Time   WBC 8.3 08/04/2018 1647   RBC 4.97 08/04/2018 1647   HGB 15.0 08/04/2018 1647   HGB 13.8 12/26/2016 1643   HCT 43.5 08/04/2018 1647   HCT 41.1 12/26/2016 1643   PLT 306.0 08/04/2018 1647   PLT 268 12/26/2016 1643   MCV 87.4 08/04/2018 1647   MCV 89 12/26/2016 1643   MCH 30.2 08/22/2017 0546   MCHC 34.5 08/04/2018 1647   RDW 13.2 08/04/2018 1647   RDW 13.0 12/26/2016 1643   LYMPHSABS 2.3 08/04/2018 1647   MONOABS 0.5 08/04/2018 1647   EOSABS 0.4 08/04/2018 1647   BASOSABS 0.1 08/04/2018 1647    BMET    Component Value Date/Time   NA 137 08/22/2017 0546  NA 137 12/26/2016 1643   K 3.8 08/22/2017 0546   CL 107 08/22/2017 0546   CO2 20 (L) 08/22/2017 0546   GLUCOSE 89 12/09/2018 0000   GLUCOSE 143 (H) 08/22/2017 0546   BUN 19 08/22/2017 0546   BUN 14 12/26/2016 1643   CREATININE 0.65 08/22/2017 0546   CREATININE 0.52 05/23/2014 0855   CALCIUM 9.1 08/22/2017 0546   GFRNONAA  >60 08/22/2017 0546   GFRAA >60 08/22/2017 0546    BNP No results found for: BNP  ProBNP No results found for: PROBNP  Specialty Problems      Pulmonary Problems   Chronic cough    Onset Aug 2019  neg resp to rx with short term pred/ flovent or saba  -  08/04/2018 trial of max rx for gerd plus pred 20 until better then 10 mg daily as occurring in setting of PF w/u in progress  - 09/01/2018   try dulera 100 2bid  - 09/01/2018 added gabapentin 100 mg tid  - 09/29/2018 still throat clearing /cough > rec 300 mg tid trial > could only tolerate 300 mg qhs as of 10/27/2018  - 10/27/2018  After extensive coaching inhaler device,  effectiveness =    25% so needs spacer / recoaching  - 12/15/2018  After extensive coaching inhaler device,  effectiveness =    75% with spacer - 03/23/2019  After extensive coaching inhaler device,  effectiveness =    90% so try dulera 100 one bid        Pulmonary infiltrates    Onset of symptoms Aug 2019  Chest HRCT  06/18/2018  highly limited by respiratory motion. With these limitations in mind, there is evidence suggestive of interstitial lung disease considered indeterminate for usual interstitial pneumonia (UIP)   - 08/04/2018   Walked RA  2 laps @  approx 243f each @ fast pace  stopped due to  End of study with sob and sats down to 82% - see ex hypoxemia - Collagen Vasc profile 08/04/2018  Nl RA, ANA, ESR=24  - HSP serology 09/01/2018  Neg  - on pred 5 mg /day :  12/15/2018   Walked RA  2 laps @  approx 2563feach @ avg pace  stopped due to  End of study, min sob, sats 93% at end   - 03/23/2019   Walked RA x one lap =  approx 250 ft @ nl pace - stopped due to end of study with sats of 96% at the end of the study and minimal sob on prednisone 5 mg qd so rec qod         [place]stitial pulmonary disease ([place]   09/01/2018-hypersensitivity pneumonitis panel-negative 08/04/2018-sed rate-24 08/04/2018-ANA-negative 08/04/2018-rheumatoid factor-less than  14  06/17/2018-CT chest high-res-study is highly limited due to respiratory motion, there is evidence suggestive of interstitial lung disease considered indeterminate for UIP per ATS consensus guidelines, outpatient referral to pulmonary for further evaluation is strongly recommended, follow-up high-resolution CT chest is recommended in 12 months to assess for temporal changes in the appearance of the lung parenchyma.  Aortic arthrosclerosis         No Known Allergies  Immunization History  Administered Date(s) Administered  . Influenza,inj,Quad PF,6+ Mos 03/20/2018, 06/28/2019  . Td 05/06/1996  . Tdap 01/15/2013   Flu vaccine today in clinic   Past Medical History:  Diagnosis Date  . GERD (gastroesophageal reflux disease)     Tobacco History: Social History   Tobacco Use  Smoking Status  Never Smoker  Smokeless Tobacco Never Used   Counseling given: Yes   Continue to not smoke  Outpatient Encounter Medications as of 06/28/2019  Medication Sig  . albuterol (VENTOLIN HFA) 108 (90 Base) MCG/ACT inhaler Inhale 2 puffs into the lungs every 6 (six) hours as needed for wheezing or shortness of breath.  . gabapentin (NEURONTIN) 100 MG capsule One each am  . mometasone-formoterol (DULERA) 100-5 MCG/ACT AERO Inhale 2 puffs into the lungs 2 (two) times a day.  Marland Kitchen omeprazole (PRILOSEC) 20 MG capsule Take 30- 60 min before your first and last meals of the day  . Phenylephrine-DM-GG (MUCINEX FAST-MAX CONGEST COUGH) 2.5-5-100 MG/5ML LIQD Take by mouth as needed.  . predniSONE (DELTASONE) 10 MG tablet Take one half daily   No facility-administered encounter medications on file as of 06/28/2019.     Review of Systems  Review of Systems  Constitutional: Negative for activity change, fatigue and fever.  HENT: Negative for sinus pressure, sinus pain and sore throat.   Respiratory: Positive for cough. Negative for shortness of breath and wheezing.   Cardiovascular: Negative for chest  pain and palpitations.  Gastrointestinal: Negative for diarrhea, nausea and vomiting.  Musculoskeletal: Negative for arthralgias.  Neurological: Negative for dizziness.  Psychiatric/Behavioral: Negative for sleep disturbance. The patient is not nervous/anxious.      Physical Exam  BP 116/82 (BP Location: Left Arm, Patient Position: Sitting, Cuff Size: Normal)   Pulse 89   Temp 98.2 F (36.8 C) (Temporal)   Ht '4\' 9"'$  (1.448 m)   Wt 167 lb 6.4 oz (75.9 kg)   SpO2 96% Comment: on RA  BMI 36.23 kg/m   Wt Readings from Last 5 Encounters:  06/28/19 167 lb 6.4 oz (75.9 kg)  04/09/19 164 lb 9.6 oz (74.7 kg)  04/05/19 165 lb (74.8 kg)  03/23/19 164 lb (74.4 kg)  12/21/18 162 lb 9.6 oz (73.8 kg)    BMI Readings from Last 5 Encounters:  06/28/19 36.23 kg/m  04/09/19 35.62 kg/m  04/05/19 32.22 kg/m  03/23/19 35.49 kg/m  12/21/18 35.19 kg/m     Physical Exam Vitals and nursing note reviewed.  Constitutional:      General: She is not in acute distress.    Appearance: Normal appearance. She is obese.  HENT:     Head: Normocephalic and atraumatic.     Right Ear: Tympanic membrane, ear canal and external ear normal. There is no impacted cerumen.     Left Ear: Tympanic membrane, ear canal and external ear normal. There is no impacted cerumen.     Nose: Nose normal. No congestion or rhinorrhea.     Mouth/Throat:     Mouth: Mucous membranes are moist.     Pharynx: Oropharynx is clear.  Eyes:     Pupils: Pupils are equal, round, and reactive to light.  Cardiovascular:     Rate and Rhythm: Normal rate and regular rhythm.     Pulses: Normal pulses.     Heart sounds: Normal heart sounds. No murmur.  Pulmonary:     Effort: Pulmonary effort is normal. No respiratory distress.     Breath sounds: No decreased air movement. Rales (basilar crackles L>R ) present. No decreased breath sounds or wheezing.  Musculoskeletal:     Cervical back: Normal range of motion.  Skin:    General:  Skin is warm and dry.     Capillary Refill: Capillary refill takes less than 2 seconds.  Neurological:     General: No focal  deficit present.     Mental Status: She is alert and oriented to person, place, and time. Mental status is at baseline.     Gait: Gait normal.  Psychiatric:        Mood and Affect: Mood normal.        Behavior: Behavior normal.        Thought Content: Thought content normal.        Judgment: Judgment normal.       Assessment & Plan:   Interstitial pulmonary disease ([place]) Plan: Walk today in office We will order pulmonary function testing We will order repeat high-resolution CT chest We will coordinate high-resolution CT chest and [facility] systems the patient can have interpreter with her May need to consider additional connective tissue labs based off of high-resolution CT chest and PFT results  Chronic cough Plan: Continue Dulera 100 Continue gabapentin Continue omeprazole PFTs ordered today  Healthcare maintenance Plan: Flu vaccine today Explained to patient today that we would recommend her for the COVID-19 vaccine when it is available to her    Return in about 6 weeks (around 08/09/2019), or if symptoms worsen or fail to improve, for Follow up with Wyn Quaker FNP-C, Follow up with Dr. Melvyn Novas, Follow up for PFT, After Chest CT.   Lauraine Rinne, NP 06/28/2019   This appointment required 34 minutes of patient care (this includes precharting, chart review, review of results, face-to-face care, etc.).

## 2019-06-28 ENCOUNTER — Ambulatory Visit (INDEPENDENT_AMBULATORY_CARE_PROVIDER_SITE_OTHER): Payer: No Typology Code available for payment source | Admitting: Pulmonary Disease

## 2019-06-28 ENCOUNTER — Encounter: Payer: Self-pay | Admitting: Pulmonary Disease

## 2019-06-28 ENCOUNTER — Other Ambulatory Visit: Payer: Self-pay

## 2019-06-28 VITALS — BP 116/82 | HR 89 | Temp 98.2°F | Ht <= 58 in | Wt 167.4 lb

## 2019-06-28 DIAGNOSIS — Z0000 Encounter for general adult medical examination without abnormal findings: Secondary | ICD-10-CM | POA: Insufficient documentation

## 2019-06-28 DIAGNOSIS — R918 Other nonspecific abnormal finding of lung field: Secondary | ICD-10-CM

## 2019-06-28 DIAGNOSIS — R05 Cough: Secondary | ICD-10-CM

## 2019-06-28 DIAGNOSIS — R053 Chronic cough: Secondary | ICD-10-CM

## 2019-06-28 DIAGNOSIS — J849 Interstitial pulmonary disease, unspecified: Secondary | ICD-10-CM | POA: Insufficient documentation

## 2019-06-28 DIAGNOSIS — Z23 Encounter for immunization: Secondary | ICD-10-CM

## 2019-06-28 HISTORY — DX: Encounter for general adult medical examination without abnormal findings: Z00.00

## 2019-06-28 NOTE — Patient Instructions (Addendum)
You were seen today by Lauraine Rinne, NP  for:   1. Interstitial pulmonary disease ([place])  - CT Chest High Resolution; Future - Pulmonary function test; Future  Walk today in office   Flu vaccine today   2. Pulmonary infiltrates  3. Chronic cough  4. Healthcare maintenance  Flu vaccine today    We recommend today:  Orders Placed This Encounter  Procedures  . CT Chest High Resolution    -High-res CT with supine and prone positioning -inspiratory and expiratory cuts.  -Only to be read by Dr. Rosario Jacks and Dr. Weber Cooks.    Standing Status:   Future    Standing Expiration Date:   08/25/2020    Scheduling Instructions:     Schedule by 07/14/2019    Order Specific Question:   Is patient pregnant?    Answer:   No    Order Specific Question:   Preferred imaging location?    Answer:   [address]    Order Specific Question:   Release to patient    Answer:   Immediate    Order Specific Question:   Radiology Contrast Protocol - do NOT remove file path    Answer:   [email or web address]  . Flu Vaccine QUAD 36+ mos IM  . Pulmonary function test    Standing Status:   Future    Standing Expiration Date:   06/27/2020    Scheduling Instructions:     Schedule asap    Order Specific Question:   Where should this test be performed?    Answer:   [facility]    Order Specific Question:   Full PFT: includes the following: basic spirometry, spirometry pre & post bronchodilator, diffusion capacity (DLCO), lung volumes    Answer:   Full PFT    Order Specific Question:   Release to patient    Answer:   Immediate   Orders Placed This Encounter  Procedures  . CT Chest High Resolution  . Flu Vaccine QUAD 36+ mos IM  . Pulmonary function test   No orders of the defined types were placed in this encounter.   Follow Up:    Return in about 6 weeks (around 08/09/2019), or if symptoms worsen or fail to improve, for Follow up with Wyn Quaker FNP-C, Follow up  with Dr. Melvyn Novas, Follow up for PFT, After Chest CT.   Please do your part to reduce the spread of COVID-19:      Reduce your risk of any infection  and COVID19 by using the similar precautions used for avoiding the common cold or flu:  Marland Kitchen Wash your hands often with soap and warm water for at least 20 seconds.  If soap and water are not readily available, use an alcohol-based hand sanitizer with at least 60% alcohol.  . If coughing or sneezing, cover your mouth and nose by coughing or sneezing into the elbow areas of your shirt or coat, into a tissue or into your sleeve (not your hands). Langley Gauss A MASK when in public  . Avoid shaking hands with others and consider head nods or verbal greetings only. . Avoid touching your eyes, nose, or mouth with unwashed hands.  . Avoid close contact with people who are sick. . Avoid places or events with large numbers of people in one location, like concerts or sporting events. . If you have some symptoms but not all symptoms, continue to monitor at home and seek medical attention if your symptoms worsen. Marland Kitchen  If you are having a medical emergency, call 911.   [place] / e-Visit: [email or web address]         [facility]: [facility]: [id number]                   [facility]: [id number]     It is flu season:   >>> Best ways to protect herself from the flu: Receive the yearly flu vaccine, practice good hand hygiene washing with soap and also using hand sanitizer when available, eat a nutritious meals, get adequate rest, hydrate appropriately   Please contact the office if your symptoms worsen or you have concerns that you are not improving.   Thank you for choosing [facility] for your healthcare, and for allowing [place] to partner with you on your healthcare journey. I am thankful to be able  to provide care to you today.   Wyn Quaker FNP-C  Influenza Virus Vaccine injection What is this medicine? INFLUENZA VIRUS VACCINE (in floo EN zuh VAHY ruhs vak SEEN) helps to reduce the risk of getting influenza also known as the flu. The vaccine only helps protect you against some strains of the flu. This medicine may be used for other purposes; ask your health care provider or pharmacist if you have questions. COMMON BRAND NAME(S): Afluria, Afluria Quadrivalent, Agriflu, Alfuria, FLUAD, Fluarix, Fluarix Quadrivalent, Flublok, Flublok Quadrivalent, FLUCELVAX, FLUCELVAX Quadrivalent, Flulaval, Flulaval Quadrivalent, Fluvirin, Fluzone, Fluzone High-Dose, Fluzone Intradermal, Fluzone Quadrivalent What should I tell my health care provider before I take this medicine? They need to know if you have any of these conditions:  bleeding disorder like hemophilia  fever or infection  Guillain-Barre syndrome or other neurological problems  immune system problems  infection with the human immunodeficiency virus (HIV) or AIDS  low blood platelet counts  multiple sclerosis  an unusual or allergic reaction to influenza virus vaccine, latex, other medicines, foods, dyes, or preservatives. Different brands of vaccines contain different allergens. Some may contain latex or eggs. Talk to your doctor about your allergies to make sure that you get the right vaccine.  pregnant or trying to get pregnant  breast-feeding How should I use this medicine? This vaccine is for injection into a muscle or under the skin. It is given by a health care professional. A copy of Vaccine Information Statements will be given before each vaccination. Read this sheet carefully each time. The sheet may change frequently. Talk to your healthcare provider to see which vaccines are right for you. Some vaccines should not be used in all age groups. Overdosage: If you think you have taken too much of this medicine contact a  poison control center or emergency room at once. NOTE: This medicine is only for you. Do not share this medicine with others. What if I miss a dose? This does not apply. What may interact with this medicine?  chemotherapy or radiation therapy  medicines that lower your immune system like etanercept, anakinra, infliximab, and adalimumab  medicines that treat or prevent blood clots like warfarin  phenytoin  steroid medicines like prednisone or cortisone  theophylline  vaccines This list may not describe all possible interactions. Give your health care provider a list of all the medicines, herbs, non-prescription drugs, or dietary supplements you use. Also tell them if you smoke, drink alcohol, or use illegal drugs. Some items may interact with your medicine. What should I watch for  while using this medicine? Report any side effects that do not go away within 3 days to your doctor or health care professional. Call your health care provider if any unusual symptoms occur within 6 weeks of receiving this vaccine. You may still catch the flu, but the illness is not usually as bad. You cannot get the flu from the vaccine. The vaccine will not protect against colds or other illnesses that may cause fever. The vaccine is needed every year. What side effects may I notice from receiving this medicine? Side effects that you should report to your doctor or health care professional as soon as possible:  allergic reactions like skin rash, itching or hives, swelling of the face, lips, or tongue Side effects that usually do not require medical attention (report to your doctor or health care professional if they continue or are bothersome):  fever  headache  muscle aches and pains  pain, tenderness, redness, or swelling at the injection site  tiredness This list may not describe all possible side effects. Call your doctor for medical advice about side effects. You may report side effects to FDA at  1-800-FDA-1088. Where should I keep my medicine? The vaccine will be given by a health care professional in a clinic, pharmacy, doctor's office, or other health care setting. You will not be given vaccine doses to store at home. NOTE: This sheet is a summary. It may not cover all possible information. If you have questions about this medicine, talk to your doctor, pharmacist, or health care provider.  2020 Elsevier/Gold Standard (2018-03-17 08:45:43)

## 2019-06-28 NOTE — Assessment & Plan Note (Signed)
Plan: Walk today in office We will order pulmonary function testing We will order repeat high-resolution CT chest We will coordinate high-resolution CT chest and [facility] systems the patient can have interpreter with her May need to consider additional connective tissue labs based off of high-resolution CT chest and PFT results

## 2019-06-28 NOTE — Assessment & Plan Note (Addendum)
Plan: Continue Dulera 100 Continue gabapentin Continue omeprazole PFTs ordered today

## 2019-06-28 NOTE — Assessment & Plan Note (Signed)
Plan: Flu vaccine today Explained to patient today that we would recommend her for the COVID-19 vaccine when it is available to her

## 2019-07-09 ENCOUNTER — Ambulatory Visit (INDEPENDENT_AMBULATORY_CARE_PROVIDER_SITE_OTHER)
Admission: RE | Admit: 2019-07-09 | Discharge: 2019-07-09 | Disposition: A | Discharge status: Home / Self Care | Payer: No Typology Code available for payment source | Source: Ambulatory Visit | Attending: Pulmonary Disease | Admitting: Pulmonary Disease

## 2019-07-09 ENCOUNTER — Other Ambulatory Visit: Payer: Self-pay

## 2019-07-09 DIAGNOSIS — J849 Interstitial pulmonary disease, unspecified: Secondary | ICD-10-CM

## 2019-07-27 ENCOUNTER — Other Ambulatory Visit: Payer: Self-pay | Admitting: Internal Medicine

## 2019-07-27 MED ORDER — GABAPENTIN 100 MG PO CAPS: ORAL_CAPSULE | ORAL | 1 refills | Status: DC

## 2019-08-30 ENCOUNTER — Other Ambulatory Visit (HOSPITAL_COMMUNITY)
Admission: RE | Admit: 2019-08-30 | Discharge: 2019-08-30 | Disposition: A | Discharge status: Home / Self Care | Payer: No Typology Code available for payment source | Source: Ambulatory Visit | Attending: Pulmonary Disease | Admitting: Pulmonary Disease

## 2019-08-30 DIAGNOSIS — Z20822 Contact with and (suspected) exposure to covid-19: Secondary | ICD-10-CM | POA: Insufficient documentation

## 2019-08-30 DIAGNOSIS — Z01812 Encounter for preprocedural laboratory examination: Secondary | ICD-10-CM | POA: Insufficient documentation

## 2019-08-30 LAB — SARS CORONAVIRUS 2 (TAT 6-24 HRS): SARS Coronavirus 2: NEGATIVE

## 2019-08-31 ENCOUNTER — Encounter: Payer: Self-pay | Admitting: *Deleted

## 2019-09-02 ENCOUNTER — Ambulatory Visit (INDEPENDENT_AMBULATORY_CARE_PROVIDER_SITE_OTHER): Payer: No Typology Code available for payment source

## 2019-09-02 ENCOUNTER — Other Ambulatory Visit: Payer: Self-pay

## 2019-09-02 ENCOUNTER — Encounter: Payer: Self-pay | Admitting: Pulmonary Disease

## 2019-09-02 ENCOUNTER — Ambulatory Visit (INDEPENDENT_AMBULATORY_CARE_PROVIDER_SITE_OTHER): Payer: No Typology Code available for payment source | Admitting: Pulmonary Disease

## 2019-09-02 ENCOUNTER — Ambulatory Visit: Payer: No Typology Code available for payment source

## 2019-09-02 VITALS — BP 110/76 | HR 82 | Temp 98.4°F | Ht <= 58 in | Wt 157.0 lb

## 2019-09-02 DIAGNOSIS — R05 Cough: Secondary | ICD-10-CM

## 2019-09-02 DIAGNOSIS — J849 Interstitial pulmonary disease, unspecified: Secondary | ICD-10-CM

## 2019-09-02 DIAGNOSIS — R053 Chronic cough: Secondary | ICD-10-CM

## 2019-09-02 DIAGNOSIS — Z8616 Personal history of COVID-19: Secondary | ICD-10-CM | POA: Insufficient documentation

## 2019-09-02 HISTORY — DX: Personal history of COVID-19: Z86.16

## 2019-09-02 NOTE — Assessment & Plan Note (Addendum)
Plan: Continue Dulera 100 Continue gabapentin Continue omeprazole Chest Xray today  4 week follow up with Dr. Melvyn Novas Need to consider flutter valve May be need to consider repeating high-resolution CT chest sooner than 1 year if cough persists

## 2019-09-02 NOTE — Assessment & Plan Note (Addendum)
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  We will plan on repeating a high-resolution CT chest in 1 year.  Plan:  4-week follow-up with Dr. Melvyn Novas

## 2019-09-02 NOTE — Progress Notes (Signed)
_0  ID: Olivia [name], female    DOB: [birth year], 53 y.o.   MRN: [id number]  Chief Complaint  Patient presents with  . Follow-up    Attempted a PFT today but was unable to complete due to coughing fits. States her coughing has increased over the past few weeks. Productive cough with thick, clear mucus. States SOB has been stable.     Referring provider: Welford Roche,*  HPI:  54 year old female never smoker followed in our office for interstitial lung disease.  Patient was referred to our office due to an abnormal CT in March/2020.  PMH: Dyslipidemia Smoker/ Smoking History: Never smoker Maintenance: Dulera 100 Patient of Dr. Melvyn Novas  09/02/2019  - Visit   54 year old female never smoker followed in our office for suspected interstitial lung disease.  Patient is status post COVID-19 infection in December/2020.  Patient was scheduled today for a pulmonary function test.  Patient was unable to complete due to recurrent coughing.  Patient reporting that she continues to have a productive cough.  This is typically worse in the morning.  Sputum is productive and clear.  She actually feels that she is making less of this over the past couple of months.  She reports the cough is worsened since testing positive for Covid in December/2020.  Patient reports adherence to [facility] 100.  She denies any recent fevers or wheezing or other sick contacts.  She does still continue to struggle with GERD.  She had some breakthrough GERD episodes last week.  She reports though that typically she is well maintained on omeprazole twice daily  Patient was tearful often during the interview as well as exam today.  Patient googled interstitial lung disease and felt that she had idiopathic pulmonary fibrosis.  So she felt that she potentially could pass away in 3 to 5 years as this was the Internet information that she read.  We will discuss this today.  Questionaires / Pulmonary Flowsheets:    MMRC: mMRC Dyspnea Scale mMRC Score  09/02/2019 0  06/28/2019 0   Tests:   05/03/2019-SARS-CoV-2-positive  09/01/2018-hypersensitivity pneumonitis panel-negative  08/04/2018-sed rate-24 08/04/2018-ANA-negative 08/04/2018-rheumatoid factor-less than 14  06/17/2018-CT chest high-res-study is highly limited due to respiratory motion, there is evidence suggestive of interstitial lung disease considered indeterminate for UIP per ATS consensus guidelines, outpatient referral to pulmonary for further evaluation is strongly recommended, follow-up high-resolution CT chest is recommended in 12 months to assess for temporal changes in the appearance of the lung parenchyma.  Aortic arthrosclerosis  FENO:  No results found for: NITRICOXIDE  PFT: No flowsheet data found.  WALK:  SIX MIN WALK 06/28/2019 03/23/2019 12/15/2018 10/27/2018 09/29/2018 09/01/2018 08/04/2018  Supplimental Oxygen during Test? (L/min) - _1  No  Tech Comments: Patient was able to complete all 3 laps at a moderate pace with a forehead probe. Denied any SOB, chest pain or leg pain during walk. No O2 needed during or after walk. moderate pace/min SOB//lmr average to moderate pace/very min SOB/no cough//lmr average pace/min SOB and cough//lmr fast pace/SOB/cough//lmr faster than average pace/min SOB//lmr faster than average pace/SOB//lmr    Imaging: DG Chest 2 View  Result Date: 09/02/2019 CLINICAL DATA:  Cough EXAM: CHEST - 2 VIEW COMPARISON:  07/09/2019 FINDINGS: Cardiac shadows within normal limits. Chronic interstitial changes are again noted in both lung similar to that seen on prior high-resolution CT. No focal infiltrate or sizable effusion is seen. No bony abnormality is noted. IMPRESSION: Chronic interstitial changes similar to that seen on  prior CT examination. Electronically Signed   By: Inez Catalina M.D.   On: 09/02/2019 15:31    Lab Results:  CBC    Component Value Date/Time   WBC 8.3 08/04/2018 1647   RBC  4.97 08/04/2018 1647   HGB 15.0 08/04/2018 1647   HGB 13.8 12/26/2016 1643   HCT 43.5 08/04/2018 1647   HCT 41.1 12/26/2016 1643   PLT 306.0 08/04/2018 1647   PLT 268 12/26/2016 1643   MCV 87.4 08/04/2018 1647   MCV 89 12/26/2016 1643   MCH 30.2 08/22/2017 0546   MCHC 34.5 08/04/2018 1647   RDW 13.2 08/04/2018 1647   RDW 13.0 12/26/2016 1643   LYMPHSABS 2.3 08/04/2018 1647   MONOABS 0.5 08/04/2018 1647   EOSABS 0.4 08/04/2018 1647   BASOSABS 0.1 08/04/2018 1647    BMET    Component Value Date/Time   NA 137 08/22/2017 0546   NA 137 12/26/2016 1643   K 3.8 08/22/2017 0546   CL 107 08/22/2017 0546   CO2 20 (L) 08/22/2017 0546   GLUCOSE 89 12/09/2018 0000   GLUCOSE 143 (H) 08/22/2017 0546   BUN 19 08/22/2017 0546   BUN 14 12/26/2016 1643   CREATININE 0.65 08/22/2017 0546   CREATININE 0.52 05/23/2014 0855   CALCIUM 9.1 08/22/2017 0546   GFRNONAA >60 08/22/2017 0546   GFRAA >60 08/22/2017 0546    BNP No results found for: BNP  ProBNP No results found for: PROBNP  Specialty Problems      Pulmonary Problems   Chronic cough    Onset Aug 2019  neg resp to rx with short term pred/ flovent or saba  -  08/04/2018 trial of max rx for gerd plus pred 20 until better then 10 mg daily as occurring in setting of PF w/u in progress  - 09/01/2018   try dulera 100 2bid  - 09/01/2018 added gabapentin 100 mg tid  - 09/29/2018 still throat clearing /cough > rec 300 mg tid trial > could only tolerate 300 mg qhs as of 10/27/2018  - 10/27/2018  After extensive coaching inhaler device,  effectiveness =    25% so needs spacer / recoaching  - 12/15/2018  After extensive coaching inhaler device,  effectiveness =    75% with spacer - 03/23/2019  After extensive coaching inhaler device,  effectiveness =    90% so try dulera 100 one bid        Pulmonary infiltrates    Onset of symptoms Aug 2019  Chest HRCT  06/18/2018  highly limited by respiratory motion. With these limitations in mind, there is  evidence suggestive of interstitial lung disease considered indeterminate for usual interstitial pneumonia (UIP)   - 08/04/2018   Walked RA  2 laps @  approx 259f each @ fast pace  stopped due to  End of study with sob and sats down to 82% - see ex hypoxemia - Collagen Vasc profile 08/04/2018  Nl RA, ANA, ESR=24  - HSP serology 09/01/2018  Neg  - on pred 5 mg /day :  12/15/2018   Walked RA  2 laps @  approx 2561feach @ avg pace  stopped due to  End of study, min sob, sats 93% at end   - 03/23/2019   Walked RA x one lap =  approx 250 ft @ nl pace - stopped due to end of study with sats of 96% at the end of the study and minimal sob on prednisone 5 mg qd so rec  qod         Interstitial pulmonary disease (HCC)    09/01/2018-hypersensitivity pneumonitis panel-negative 08/04/2018-sed rate-24 08/04/2018-ANA-negative 08/04/2018-rheumatoid factor-less than 14  06/17/2018-CT chest high-res-study is highly limited due to respiratory motion, there is evidence suggestive of interstitial lung disease considered indeterminate for UIP per ATS consensus guidelines, outpatient referral to pulmonary for further evaluation is strongly recommended, follow-up high-resolution CT chest is recommended in 12 months to assess for temporal changes in the appearance of the lung parenchyma.  Aortic arthrosclerosis         No Known Allergies  Immunization History  Administered Date(s) Administered  . Influenza,inj,Quad PF,6+ Mos 03/20/2018, 06/28/2019  . Td 05/06/1996  . Tdap 01/15/2013    Past Medical History:  Diagnosis Date  . GERD (gastroesophageal reflux disease)     Tobacco History: Social History   Tobacco Use  Smoking Status Never Smoker  Smokeless Tobacco Never Used   Counseling given: Yes   Continue to not smoke  Outpatient Encounter Medications as of 09/02/2019  Medication Sig  . albuterol (VENTOLIN HFA) 108 (90 Base) MCG/ACT inhaler Inhale 2 puffs into the lungs every 6 (six) hours as needed  for wheezing or shortness of breath.  . gabapentin (NEURONTIN) 100 MG capsule One each am  . mometasone-formoterol (DULERA) 100-5 MCG/ACT AERO Inhale 2 puffs into the lungs 2 (two) times a day.  Marland Kitchen omeprazole (PRILOSEC) 20 MG capsule Take 30- 60 min before your first and last meals of the day  . Phenylephrine-DM-GG (MUCINEX FAST-MAX CONGEST COUGH) 2.5-5-100 MG/5ML LIQD Take by mouth as needed.  . predniSONE (DELTASONE) 10 MG tablet Take one half daily   No facility-administered encounter medications on file as of 09/02/2019.     Review of Systems  Review of Systems  Constitutional: Negative for activity change, fatigue and fever.  HENT: Negative for sinus pressure, sinus pain and sore throat.   Respiratory: Positive for cough. Negative for shortness of breath and wheezing.   Cardiovascular: Negative for chest pain and palpitations.  Gastrointestinal: Negative for diarrhea, nausea and vomiting.  Musculoskeletal: Negative for arthralgias.  Neurological: Negative for dizziness.  Psychiatric/Behavioral: Negative for sleep disturbance. The patient is not nervous/anxious.      Physical Exam  BP 110/76 (BP Location: Left Arm, Patient Position: Sitting, Cuff Size: Normal)   Pulse 82   Temp 98.4 F (36.9 C) (Temporal)   Ht _0  (1.448 m)   Wt 157 lb (71.2 kg)   LMP  (LMP Unknown)   SpO2 96% Comment: on RA  BMI 33.97 kg/m   Wt Readings from Last 5 Encounters:  09/02/19 157 lb (71.2 kg)  06/28/19 167 lb 6.4 oz (75.9 kg)  04/09/19 164 lb 9.6 oz (74.7 kg)  04/05/19 165 lb (74.8 kg)  03/23/19 164 lb (74.4 kg)    BMI Readings from Last 5 Encounters:  09/02/19 33.97 kg/m  06/28/19 36.23 kg/m  04/09/19 35.62 kg/m  04/05/19 32.22 kg/m  03/23/19 35.49 kg/m     Physical Exam Vitals and nursing note reviewed.  Constitutional:      General: She is not in acute distress.    Appearance: Normal appearance. She is obese.  HENT:     Head: Normocephalic and atraumatic.     Right  Ear: External ear normal. There is no impacted cerumen.     Left Ear: External ear normal. There is no impacted cerumen.     Nose: Nose normal. No congestion or rhinorrhea.     Mouth/Throat:  Mouth: Mucous membranes are moist.     Pharynx: Oropharynx is clear.  Eyes:     Pupils: Pupils are equal, round, and reactive to light.  Cardiovascular:     Rate and Rhythm: Normal rate and regular rhythm.     Pulses: Normal pulses.     Heart sounds: Normal heart sounds. No murmur.  Pulmonary:     Effort: Pulmonary effort is normal. No respiratory distress.     Breath sounds: No decreased air movement. No decreased breath sounds, wheezing or rales.  Musculoskeletal:     Cervical back: Normal range of motion.  Skin:    General: Skin is warm and dry.     Capillary Refill: Capillary refill takes less than 2 seconds.  Neurological:     General: No focal deficit present.     Mental Status: She is alert and oriented to person, place, and time. Mental status is at baseline.     Gait: Gait normal.  Psychiatric:        Mood and Affect: Mood normal. Affect is tearful.        Behavior: Behavior normal.        Thought Content: Thought content normal.        Judgment: Judgment normal.     Comments: Patient frustrated by cough as well as not being able to complete pulmonary function test.  Patient is also tearful because she looked up interstitial lung disease and felt that she was diagnosed with IPF.  We discussed this today.       Assessment & Plan:   Discussion: Had a significant we long discussion with the patient via an interpreter.  I do believe she likely has some aspect of interstitial lung disease that likely is postinflammatory fibrosis status post COVID-19 infection based off of high-resolution CT imaging as well as worsening cough since the infection.  Chest x-ray today is stable.  I explained that does not favor UIP or IPF on high-resolution CT imaging which is  reassuring and unlikely to  be IPF.  Interstitial pulmonary disease ([place]) Unable to do PFT  Potential interstitial lung disease status post COVID-19  Plan: 4 week follow up with Dr. Melvyn Novas  Chest x-ray Likely need to reconsider scheduling pulmonary function testing    Chronic cough Plan: Continue Dulera 100 Continue gabapentin Continue omeprazole Chest Xray today  4 week follow up with Dr. Melvyn Novas   History of COVID-19 Dec/2020 - positive  Did not get mab  Did not get hospitalized HRCT shows potential ILD, will repeat in 1 year  Discussion:  I have a suspicion that patient has a worsening cough due to symptoms of long Covid as well as potential COVID-19 ILD.  We will plan on repeating a high-resolution CT chest in 1 year.  Plan:  4-week follow-up with Dr. Melvyn Novas    Return in about 4 weeks (around 09/30/2019), or if symptoms worsen or fail to improve, for Follow up with Dr. Melvyn Novas.   Lauraine Rinne, NP 09/02/2019   This appointment required 44 minutes of patient care (this includes precharting, chart review, review of results, face-to-face care, etc.).

## 2019-09-02 NOTE — Assessment & Plan Note (Addendum)
Unable to do PFT  Potential interstitial lung disease status post COVID-19  Plan: 4 week follow up with Dr. Melvyn Novas  Chest x-ray Likely need to reconsider scheduling pulmonary function testing

## 2019-09-02 NOTE — Patient Instructions (Addendum)
You were seen today by Lauraine Rinne, NP  for:   1. Chronic cough  - DG Chest 2 View; Future  I am sorry you were unable to complete the pulmonary function test this would have been able to help evaluate your cough as well as if you have had changes in your lung functioning due to COVID-19 in December/2020  Continue the prednisone as ordered by Dr. Melvyn Novas  Continue gabapentin as ordered by Dr. Melvyn Novas  Continue Ruthe Mannan 100  Continue omeprazole for control of acid reflux If you continue to have breakthrough reflux despite using your omeprazole please contact our office and we can refer you to gastroenterology  We will bring you back in 4 weeks to see Dr. Melvyn Novas  2. Interstitial pulmonary disease ([place]) 3. History of COVID-19  - DG Chest 2 View; Future     We recommend today:  Orders Placed This Encounter  Procedures  . DG Chest 2 View    Standing Status:   Future    Standing Expiration Date:   11/01/2020    Order Specific Question:   Reason for Exam (SYMPTOM  OR DIAGNOSIS REQUIRED)    Answer:   cough    Order Specific Question:   Preferred imaging location?    Answer:   Internal    Order Specific Question:   Radiology Contrast Protocol - do NOT remove file path    Answer:   [email or web address]   Orders Placed This Encounter  Procedures  . DG Chest 2 View   No orders of the defined types were placed in this encounter.   Follow Up:    Return in about 4 weeks (around 09/30/2019), or if symptoms worsen or fail to improve, for Follow up with Dr. Melvyn Novas.  If nothing available in this timeframe please schedule at Dr. Gustavus Bryant next available.      Please do your part to reduce the spread of COVID-19:      Reduce your risk of any infection  and COVID19 by using the similar precautions used for avoiding the common cold or flu:  Marland Kitchen Wash your hands often with soap and warm water for at least 20 seconds.  If soap and water are not readily  available, use an alcohol-based hand sanitizer with at least 60% alcohol.  . If coughing or sneezing, cover your mouth and nose by coughing or sneezing into the elbow areas of your shirt or coat, into a tissue or into your sleeve (not your hands). Langley Gauss A MASK when in public  . Avoid shaking hands with others and consider head nods or verbal greetings only. . Avoid touching your eyes, nose, or mouth with unwashed hands.  . Avoid close contact with people who are sick. . Avoid places or events with large numbers of people in one location, like concerts or sporting events. . If you have some symptoms but not all symptoms, continue to monitor at home and seek medical attention if your symptoms worsen. . If you are having a medical emergency, call 911.   [place] / e-Visit: [email or web address]         [facility]: [phone]  [facility]: [id number]                   [facility]: [id number]     It is flu season:   >>> Best ways to protect herself from the flu: Receive the yearly flu  vaccine, practice good hand hygiene washing with soap and also using hand sanitizer when available, eat a nutritious meals, get adequate rest, hydrate appropriately   Please contact the office if your symptoms worsen or you have concerns that you are not improving.   Thank you for choosing [facility] for your healthcare, and for allowing [place] to partner with you on your healthcare journey. I am thankful to be able to provide care to you today.   Wyn Quaker FNP-C

## 2019-09-06 NOTE — Progress Notes (Signed)
Patient identification verified. Results of recent CXR reviewed. Patient verbalized understanding of results and ongoing plan of care.

## 2019-09-08 ENCOUNTER — Other Ambulatory Visit: Payer: Self-pay

## 2019-09-08 MED ORDER — MOMETASONE FURO-FORMOTEROL FUM 100-5 MCG/ACT IN AERO: 2.0000 | INHALATION_SPRAY | Freq: Two times a day (BID) | RESPIRATORY_TRACT | 11 refills | Status: DC

## 2019-10-11 ENCOUNTER — Encounter: Payer: Self-pay | Admitting: Internal Medicine

## 2019-10-11 ENCOUNTER — Ambulatory Visit (INDEPENDENT_AMBULATORY_CARE_PROVIDER_SITE_OTHER): Payer: No Typology Code available for payment source | Admitting: Internal Medicine

## 2019-10-11 ENCOUNTER — Other Ambulatory Visit: Payer: Self-pay

## 2019-10-11 DIAGNOSIS — R053 Chronic cough: Secondary | ICD-10-CM

## 2019-10-11 DIAGNOSIS — R05 Cough: Secondary | ICD-10-CM

## 2019-10-11 DIAGNOSIS — R918 Other nonspecific abnormal finding of lung field: Secondary | ICD-10-CM

## 2019-10-11 MED ORDER — GABAPENTIN 300 MG PO CAPS: 300.0000 mg | ORAL_CAPSULE | Freq: Two times a day (BID) | ORAL | 2 refills | Status: DC

## 2019-10-11 NOTE — Patient Instructions (Addendum)
Resume gabapentin 100 mg twice daily stop if bothers you and build to 300 mg twice daily to see if helps the cough (the new pill is = 300 mg) .  Make sure you check your oxygen saturations at highest level of activity to be sure it stays over 90% and adjust upward to maintain this level if needed but remember to turn it back to previous settings when you stop (to conserve your supply).   GERD (REFLUX)  is an extremely common cause of respiratory symptoms just like yours , many times with no obvious heartburn at all.    It can be treated with medication, but also with lifestyle changes including elevation of the head of your bed (ideally with 6 -8inch blocks under the headboard of your bed),  Smoking cessation, avoidance of late meals, excessive alcohol, and avoid fatty foods, chocolate, peppermint, colas, red wine, and acidic juices such as orange juice.  NO MINT OR MENTHOL PRODUCTS SO NO COUGH DROPS  USE SUGARLESS CANDY INSTEAD (Jolley ranchers or Stover's or Life Savers) or even ice chips will also do - the key is to swallow to prevent all throat clearing. NO OIL BASED VITAMINS - use powdered substitutes.  Avoid fish oil when coughing.     Please schedule a follow up visit in 3 months but call sooner if needed

## 2019-10-11 NOTE — Progress Notes (Signed)
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 Pulmonary Consult    Referred by Dr Frederico Hamman. Pt c/o SOB since August 2019. She gets SOB walking short distances and also when she makes the bed.   Dyspnea:  Indolent onset doe summer 2019   Now [facility] = can't walk a nl pace on a flat grade s sob but does fine slow and flat  Cough: to point of vomiting / mucus is clear  Sleep: flat bed and one pillow nonoct cough  SABA use: don't help/ neither does flovent or tessalon prn  No significant arthritis but feels better/ cough less p advil  2 dogs/ no birds  No h/o rheumatologic symptoms, chemo or amiodarone or macrodantin exp. rec GERD diet  Omeprazole 20 mg Take 30- 60 min before your first and last meals of the day  Prednisone 10 mg x 2 daily until better then 1 daily until return  Please schedule a follow up office visit in 4 weeks, sooner if needed  with all medications /inhalers/ solutions in hand Please bring a family member to help interpret at this visit, thank you    09/01/2018  f/u ov/Olivia [name] re:  PF with gg changes / rx pred 20  Chief Complaint  Patient presents with  . Follow-up    Breathing is some better. She is still taking 20 mg prednisone. She is using her ventolin once daily on average.   Dyspnea:  50-100 ft  Cough: still cough on insp/ no more vomiting Sleeping: able to sleep flat /one  SABA use: helps sob and cough / qvar does not 02: none  rec Plan A = Automatic = dulera 100 Take 2  puffs first thing in am and then another 2 puffs about 12 hours later. Gabapentin 100 mg three times a day  Work on inhaler technique:   Plan B = Backup Only use your albuterol inhaler as a rescue medication   Please remember to go to the lab department   for your tests - we will call you with the results when they are available. Continue omeprazole 20 mg Take 30- 60 min before your first and last meals of the day  GERD diet    09/29/2018  f/u ov/Olivia [name] re:  PF / asthma improved on prednisone 10 mg qd  Chief Complaint  Patient presents with  . Follow-up    Breathing is doing well today. She has not had to use her rescue inhaler. She has had cough with clear sputum.    Dyspnea:  Walk up 30 min around neighborhood / trouble on hills only  Cough: dry /cough with mopping / deep breathing/ none noct  Sleeping: fine at bedtime flat/one pillo2 SABA use: none  02: none  rec Dulera 100 Take 2 puffs first thing in am and then another 2 puffs about 12 hours later.  Work on inhaler technique:  Increase gabapentin to 300 mg  three times daily  Prednisone 10 mg one on even, one half on odd days  Please schedule a follow up office visit in 4 weeks, sooner if needed     10/27/2018  f/u ov/Olivia [name] re: pf/ cough variant asthma with uacs  On gabapentin 300 mg hs and still on prednisone 10 mg daily   Chief Complaint  Patient presents with  . Follow-up    Cough has improved some.   Dyspnea:  Walking up to  30 min daily Cough: better esp while on gabapentin / min mucoid Sleeping: fine flat one pillow SABA use: very little rec Work on inhaler technique:   Try prednisone 10 mg one half daily - if worse then start back at 20 mg daily until better, then 10 mg per day for a week then back to 5 mg daily     12/15/2018  f/u ov/Olivia [name] re: pf/cough variant asthma prednisone 5 mg daily/  Gabapentin 200 in am and 300 at hs // omeprazole 20 mg bid ac  Chief Complaint  Patient presents with  . Follow-up     Breathing is better and less cough in the am. Cough still bothers her when she walks.   Dyspnea:  Walking up to 30 min s stopping  Cough: better but just when starts walking or hot air exp, not noct, occurs always on deep insp Sleeping: no problem lying flat one pillow  SABA use: none 02: not / not monitoring  rec Try again to take gabapentin to 300 mg twice  Daily if you can - if not, resume previous dose No change on prednisone dose for now = 5 mg daily  Ideally need to track 02 levels at peak activity at least once a week to monitor your lung condition Please schedule a follow up visit in 3 months but call sooner if needed    03/23/2019  f/u ov/Olivia [name] re:  PF/ cough variant asthma vs uacs on pred 5 mg daily and dulera 100 2bid  Chief Complaint  Patient presents with  . Follow-up    Breathing is overall doing better but she does notice some SOB when she wakes up in the morning. She has minimal cough.   Dyspnea:  Not as much with colder weather  Cough: better Sleeping:  No resp problems - one pillow  SABA use:  02:  None rec Keep walking as much as you can  Try taking prednisone 10 mg one half on even days only to see if it makes a difference Reduce your Dulera to one puff twice daily  GERD diet     10/11/2019  f/u ov/Olivia [name] re: prob nsip/ on dulera 100  2bid / omeprazole bid / off prednisone since 1st of 2021 Chief Complaint  Patient presents with  . Follow-up    4-6wk for cough. States she still has a cough, still coughing up phlegm. No one has a headache after coughing. Coughing has decreased. Clear phlegm.    Dyspnea:  Able to do housework same on pred as off / walking 15- 20 min 3 x weekly  Cough: daytime /minimal mucoid production Sleeping: fine one pillow SABA use: none  02: none/ not checking 02 sats    No obvious day to day or daytime variability or assoc  purulent sputum or mucus plugs or hemoptysis or cp or chest tightness, subjective wheeze or overt sinus or hb  symptoms.   none without nocturnal  or early am exacerbation  of respiratory  c/o's or need  for noct saba. Also denies any obvious fluctuation of symptoms with weather or environmental changes or other aggravating or alleviating factors except as outlined above   No unusual exposure hx or h/o childhood pna/ asthma or knowledge of premature birth.  Current Allergies, Complete Past Medical History, Past Surgical History, Family History, and Social History were reviewed in [employer] record.  ROS  The following are not active complaints unless bolded Hoarseness, sore throat, dysphagia, dental problems, itching, sneezing,  nasal congestion or discharge of excess mucus or purulent secretions, ear ache,   fever, chills, sweats, unintended wt loss or wt gain, classically pleuritic or exertional cp,  orthopnea pnd or arm/hand swelling  or leg swelling, presyncope, palpitations, abdominal pain, anorexia, nausea, vomiting, diarrhea  or change in bowel habits or change in bladder habits, change in stools or change in urine, dysuria, hematuria,  rash, arthralgias, visual complaints, headache, numbness, weakness or ataxia or problems with walking or coordination,  change in mood or  memory.        Current Meds  Medication Sig  . albuterol (VENTOLIN HFA) 108 (90 Base) MCG/ACT inhaler Inhale 2 puffs into the lungs every 6 (six) hours as needed for wheezing or shortness of breath.  . mometasone-formoterol (DULERA) 100-5 MCG/ACT AERO Inhale 2 puffs into the lungs in the morning and at bedtime.  Marland Kitchen omeprazole (PRILOSEC) 20 MG capsule Take 30- 60 min before your first and last meals of the day  . Phenylephrine-DM-GG (MUCINEX FAST-MAX CONGEST COUGH) 2.5-5-100 MG/5ML LIQD Take by mouth as needed.                           Objective:    amb latina nad   10/11/2019         155  03/23/2019     164  12/15/2018       162  10/27/2018       164  09/01/2018       160  08/04/18 158 lb  (71.7 kg)  05/29/18 156 lb 6.4 oz (70.9 kg)  03/20/18 159 lb (72.1 kg)    Vital signs reviewed  10/11/2019  - Note at rest 02 sats  96% on RA       HEENT : pt wearing mask not removed for exam due to covid -19 concerns.    NECK :  without JVD/Nodes/TM/ nl carotid upstrokes bilaterally   LUNGS: no acc muscle use,  Nl contour chest which is clear to A and P bilaterally without cough on insp or exp maneuvers   CV:  RRR  no s3 or murmur or increase in P2, and no edema   ABD:  soft and nontender with nl inspiratory excursion in the supine position. No bruits or organomegaly appreciated, bowel sounds nl  MS:  Nl gait/ ext warm without deformities, calf tenderness, cyanosis - mild  clubbing No obvious joint restrictions   SKIN: warm and dry without lesions    NEURO:  alert, approp, nl sensorium with  no motor or cerebellar deficits apparent.               Assessment

## 2019-10-13 ENCOUNTER — Encounter: Payer: Self-pay | Admitting: Internal Medicine

## 2019-10-13 NOTE — Assessment & Plan Note (Signed)
Onset Aug 2019  neg resp to rx with short term pred/ flovent or saba  -  08/04/2018 trial of max rx for gerd plus pred 20 until better then 10 mg daily as occurring in setting of PF w/u in progress  - 09/01/2018   try dulera 100 2bid  - 09/01/2018 added gabapentin 100 mg tid  - 09/29/2018 still throat clearing /cough > rec 300 mg tid trial > could only tolerate 300 mg qhs as of 10/27/2018  - 10/27/2018  After extensive coaching inhaler device,  effectiveness =    25% so needs spacer / recoaching  - 12/15/2018  After extensive coaching inhaler device,  effectiveness =    75% with spacer - 03/23/2019  After extensive coaching inhaler device,  effectiveness =    90% so try dulera 100 one bid   - 10/11/2019 resume gabapentin titrate up to 300 mg bid   Worse off gabapentin despite dulera 100 2bid so rec titrate back up to the lowest effective dose.         Each maintenance medication was reviewed in detail including emphasizing most importantly the difference between maintenance and prns and under what circumstances the prns are to be triggered using an action plan format where appropriate.  Total time for H and P, chart review, counseling, directly observing portions of ambulatory 02 saturation study/  and generating customized AVS unique to this office visit / charting = 20 min

## 2019-10-13 NOTE — Assessment & Plan Note (Addendum)
Onset of symptoms Aug 2019  Chest HRCT  06/18/2018  highly limited by respiratory motion. With these limitations in mind, there is evidence suggestive of interstitial lung disease considered indeterminate for usual interstitial pneumonia (UIP)   - 08/04/2018   Walked RA  2 laps @  approx 279f each @ fast pace  stopped due to  End of study with sob and sats down to 82% - see ex hypoxemia - Collagen Vasc profile 08/04/2018  Nl RA, ANA, ESR=24  - HSP serology 09/01/2018  Neg  - on pred 5 mg /day :  12/15/2018   Walked RA  2 laps @  approx 2526feach @ avg pace  stopped due to  End of study, min sob, sats 93% at end   - 03/23/2019   Walked RA x one lap =  approx 250 ft @ nl pace - stopped due to end of study with sats of 96% at the end of the study and minimal sob on prednisone 5 mg qd so rec qod - Stopped prednisone around 05/07/2019 - 07/09/19 HRCT Very similar findings in the lungs, albeit with slight improvement compared to the prior examination, as detailed above. Although the pattern is technically characterized as indeterminate for usual interstitial pneumonia (UIP), given the slight regression compared to the prior study, UIP is considered unlikely. The primary differential consideration is nonspecific interstitial pneumonia (NSIP).   -  10/11/2019   Walked RA  2 laps @ approx 25068fach @ moderate pace  stopped due to end of study,   no sob - sats 93% at end   No specific dx but favor NSIP but doing fine off prednisone so far so no change rx needed

## 2019-12-15 ENCOUNTER — Telehealth: Payer: Self-pay | Admitting: Student

## 2019-12-15 ENCOUNTER — Other Ambulatory Visit: Payer: Self-pay

## 2019-12-15 ENCOUNTER — Encounter: Payer: No Typology Code available for payment source | Admitting: Internal Medicine

## 2019-12-15 NOTE — Telephone Encounter (Signed)
Received call from [place] interpreter, Spero Geralds, stating that the patient is coughing a lot along with sore throat.  Call back telephone number is  [phone] to do a 3-way phone call with patient.  Forwarding to triage.

## 2019-12-15 NOTE — Telephone Encounter (Signed)
Pt states thru interpreter if she is going to be charged for a visit she is not having an appt. Ask interpreter to stress that she may become worse and then hospital bill would be much more than a telehealth appt

## 2020-01-11 ENCOUNTER — Ambulatory Visit: Payer: No Typology Code available for payment source | Admitting: Internal Medicine

## 2020-01-17 ENCOUNTER — Other Ambulatory Visit: Payer: Self-pay

## 2020-01-17 ENCOUNTER — Ambulatory Visit (INDEPENDENT_AMBULATORY_CARE_PROVIDER_SITE_OTHER): Payer: Self-pay | Admitting: Internal Medicine

## 2020-01-17 ENCOUNTER — Encounter: Payer: Self-pay | Admitting: Internal Medicine

## 2020-01-17 DIAGNOSIS — R05 Cough: Secondary | ICD-10-CM

## 2020-01-17 DIAGNOSIS — R053 Chronic cough: Secondary | ICD-10-CM

## 2020-01-17 DIAGNOSIS — R918 Other nonspecific abnormal finding of lung field: Secondary | ICD-10-CM

## 2020-01-17 NOTE — Patient Instructions (Addendum)
Stop dulera for now   Only use your albuterol as a rescue medication to be used if you can't catch your breath by resting or doing a relaxed purse lip breathing pattern.  - The less you use it, the better it will work when you need it. - Ok to use up to 2 puffs  every 4 hours if you must but call for immediate appointment if use goes up over your usual need - Don't leave home without it !!  (think of it like the spare tire for your car)   Try albuterol 15 min before an activity that you know would make you short of breath and see if it makes any difference and if makes none then don't take it after activity unless you can't catch your breath.   Prednisone 10 mg x 2 daily until cough and breathing are back to normal then reduce back to 1 daily   Add gabapentin 100 mg each am    Please schedule a follow up office visit in 4 weeks, sooner if needed     Deja de dulera por ahora  Solo use su albuterol como medicamento de rescate si no puede recuperar el aliento descansando o haciendo un patrn de respiracin relajado con los labios en bolsa. - Cuanto menos lo use, mejor funcionar cuando lo necesite. - Est bien usar hasta 2 inhalaciones cada 4 horas si es necesario, pero llame para una cita inmediata si el uso supera su necesidad habitual - No salgas de casa sin l !! (piense en ello como la llanta de repuesto de su automvil)  Pruebe el albuterol 15 minutos antes de una actividad que sepa que le provocara dificultad para respirar y vea si hace alguna diferencia y si no hace ninguna, entonces no lo tome despus de la actividad a menos que no pueda [employer].   Prednisona 10 mg x 2 al da [employer] la tos y la respiracin vuelvan a la normalidad, luego reduzca de nuevo a 1 al da  Agregue 100 mg de gabapentina [employer].   Programe una visita de seguimiento al [employer] en 4 semanas, antes si es necesario

## 2020-01-17 NOTE — Progress Notes (Signed)
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 Pulmonary Consult    Referred by Dr Frederico Hamman. Pt c/o SOB since August 2019. She gets SOB walking short distances and also when she makes the bed.   Dyspnea:  Indolent onset doe summer 2019   Now [facility] = can't walk a nl pace on a flat grade s sob but does fine slow and flat  Cough: to point of vomiting / mucus is clear  Sleep: flat bed and one pillow nonoct cough  SABA use: don't help/ neither does flovent or tessalon prn  No significant arthritis but feels better/ cough less p advil  2 dogs/ no birds  No h/o rheumatologic symptoms, chemo or amiodarone or macrodantin exp. rec GERD diet  Omeprazole 20 mg Take 30- 60 min before your first and last meals of the day  Prednisone 10 mg x 2 daily until better then 1 daily until return  Please schedule a follow up office visit in 4 weeks, sooner if needed  with all medications /inhalers/ solutions in hand Please bring a family member to help interpret at this visit, thank you    09/01/2018  f/u ov/Olivia [name] re:  PF with gg changes / rx pred 20  Chief Complaint  Patient presents with  . Follow-up    Breathing is some better. She is still taking 20 mg prednisone. She is using her ventolin once daily on average.   Dyspnea:  50-100 ft  Cough: still cough on insp/ no more vomiting Sleeping: able to sleep flat /one  SABA use: helps sob and cough / qvar does not 02: none  rec Plan A = Automatic = dulera 100 Take 2  puffs first thing in am and then another 2 puffs about 12 hours later. Gabapentin 100 mg three times a day  Work on inhaler technique:   Plan B = Backup Only use your albuterol inhaler as a rescue medication   Please remember to go to the lab department   for your tests - we will call you with the results when they are available. Continue omeprazole 20 mg Take 30- 60 min before your first and last meals of the day  GERD diet    09/29/2018  f/u ov/Olivia [name] re:  PF / asthma improved on prednisone 10 mg qd  Chief Complaint  Patient presents with  . Follow-up    Breathing is doing well today. She has not had to use her rescue inhaler. She has had cough with clear sputum.    Dyspnea:  Walk up 30 min around neighborhood / trouble on [place] only  Cough: dry /cough with mopping / deep breathing/ none noct  Sleeping: fine at bedtime flat/one pillo2 SABA use: none  02: none  rec Dulera 100 Take 2 puffs first thing in am and then another 2 puffs about 12 hours later.  Work on inhaler technique:  Increase gabapentin to 300 mg  three times daily  Prednisone 10 mg one on even, one half on odd days  Please schedule a follow up office visit in 4 weeks, sooner if needed     10/27/2018  f/u ov/Olivia [name] re: pf/ cough variant asthma with uacs  On gabapentin 300 mg hs and still on prednisone 10 mg daily   Chief Complaint  Patient presents with  . Follow-up    Cough has improved some.   Dyspnea:  Walking up to  30 min daily Cough: better esp while on gabapentin / min mucoid Sleeping: fine flat one pillow SABA use: very little rec Work on inhaler technique:   Try prednisone 10 mg one half daily - if worse then start back at 20 mg daily until better, then 10 mg per day for a week then back to 5 mg daily     12/15/2018  f/u ov/Olivia [name] re: pf/cough variant asthma prednisone 5 mg daily/  Gabapentin 200 in am and 300 at hs // omeprazole 20 mg bid ac  Chief Complaint  Patient presents with  . Follow-up     Breathing is better and less cough in the am. Cough still bothers her when she walks.   Dyspnea:  Walking up to 30 min s stopping  Cough: better but just when starts walking or hot air exp, not noct, occurs always on deep insp Sleeping: no problem lying flat one pillow  SABA use: none 02: not / not monitoring  rec Try again to take gabapentin to 300 mg twice  Daily if you can - if not, resume previous dose No change on prednisone dose for now = 5 mg daily  Ideally need to track 02 levels at peak activity at least once a week to monitor your lung condition Please schedule a follow up visit in 3 months but call sooner if needed    03/23/2019  f/u ov/Olivia [name] re:  PF/ cough variant asthma vs uacs on pred 5 mg daily and dulera 100 2bid  Chief Complaint  Patient presents with  . Follow-up    Breathing is overall doing better but she does notice some SOB when she wakes up in the morning. She has minimal cough.   Dyspnea:  Not as much with colder weather  Cough: better Sleeping:  No resp problems - one pillow  SABA use:  02:  None rec Keep walking as much as you can  Try taking prednisone 10 mg one half on even days only to see if it makes a difference Reduce your Dulera to one puff twice daily  GERD diet    05/03/19   POS COVID testing   10/11/2019  f/u ov/Olivia [name] re: prob nsip/ on dulera 100  2bid / omeprazole bid / off prednisone since 1st of 2021 Chief Complaint  Patient presents with  . Follow-up    4-6wk for cough. States she still has a cough, still coughing up phlegm. No one has a headache after coughing. Coughing has decreased. Clear phlegm.   Dyspnea:  Able to do housework same on pred as off / walking 15- 20 min 3 x weekly  Cough: daytime /minimal mucoid production Sleeping: fine one pillow SABA use: none  02: none/ not checking 02 sats  rec Resume gabapentin 100 mg twice daily stop if bothers you and build to 300 mg twice daily to see if helps the cough (the new pill is = 300  mg) . Make sure you check your oxygen saturations at highest level of activity  GERD diet    01/17/2020  f/u ov/Olivia [name] re: nsip  On dulera  With worse cough overall since covid 04/2019 / pred 5 mg daily  Chief Complaint  Patient presents with  . Follow-up    reports feeling okay but would like to discuss different inhaler due to expense of current on  Dyspnea:  Able to do housework p dulera  Cough: to point of vomiting ever since covid / mucoid  Sleeping: no cough at night at all  SABA use: none at present  02: none - sats only drop when walking uphills    No obvious day to day or daytime variability or assoc excess/ purulent sputum or mucus plugs or hemoptysis or cp or chest tightness, subjective wheeze or overt sinus or hb symptoms.   Sleeping  without nocturnal  or early am exacerbation  of respiratory  c/o's or need for noct saba. Also denies any obvious fluctuation of symptoms with weather or environmental changes or other aggravating or alleviating factors except as outlined above   No unusual exposure hx or h/o childhood pna/ asthma or knowledge of premature birth.  Current Allergies, Complete Past Medical History, Past Surgical History, Family History, and Social History were reviewed in [employer] record.  ROS  The following are not active complaints unless bolded Hoarseness, sore throat, dysphagia, dental problems, itching, sneezing,  nasal congestion or discharge of excess mucus or purulent secretions, ear ache,   fever, chills, sweats, unintended wt loss or wt gain, classically pleuritic or exertional cp,  orthopnea pnd or arm/hand swelling  or leg swelling, presyncope, palpitations, abdominal pain, anorexia, nausea, vomiting, diarrhea  or change in bowel habits or change in bladder habits, change in stools or change in urine, dysuria, hematuria,  rash, arthralgias, visual complaints, headache, numbness, weakness or ataxia or problems with walking or  coordination,  change in mood or  memory.        Current Meds  Medication Sig  . albuterol (VENTOLIN HFA) 108 (90 Base) MCG/ACT inhaler Inhale 2 puffs into the lungs every 6 (six) hours as needed for wheezing or shortness of breath.  . gabapentin (NEURONTIN) 300 MG capsule Take 1 capsule (300 mg total) by mouth 2 (two) times daily.  . mometasone-formoterol (DULERA) 100-5 MCG/ACT AERO Inhale 2 puffs into the lungs in the morning and at bedtime.  Marland Kitchen omeprazole (PRILOSEC) 20 MG capsule Take 30- 60 min before your first and last meals of the day  . Phenylephrine-DM-GG (MUCINEX FAST-MAX CONGEST COUGH) 2.5-5-100 MG/5ML LIQD Take by mouth as needed.  . predniSONE (DELTASONE) 10 MG tablet Take one half daily                            Objective:    amb latina nad   01/17/2020       157 10/11/2019         155  03/23/2019     164  12/15/2018       162  10/27/2018       164  09/01/2018       160  08/04/18 158 lb (71.7 kg)  05/29/18 156 lb 6.4 oz (70.9 kg)  03/20/18 159 lb (72.1 kg)    Vital signs reviewed  01/17/2020  - Note at rest 02 sats  96% on RA    HEENT : pt wearing mask not removed for exam due to covid -19 concerns.    NECK :  without JVD/Nodes/TM/ nl carotid upstrokes bilaterally   LUNGS: no acc muscle use,  Nl contour chest with minimal crackles  insp  bilaterally without cough on insp or exp maneuvers   CV:  RRR  no s3 or murmur or increase in P2, and no edema   ABD:  soft and nontender with nl inspiratory excursion in the supine position. No bruits or organomegaly appreciated, bowel sounds nl  MS:  Nl gait/ ext warm without deformities, calf tenderness, cyanosis or clubbing No obvious joint restrictions   SKIN: warm and dry without lesions    NEURO:  alert, approp, nl sensorium with  no motor or cerebellar deficits apparent.          Assessment

## 2020-01-18 ENCOUNTER — Encounter: Payer: Self-pay | Admitting: Internal Medicine

## 2020-01-18 NOTE — Assessment & Plan Note (Addendum)
Onset Aug 2019  neg resp to rx with short term pred/ flovent or saba  -  08/04/2018 trial of max rx for gerd plus pred 20 until better then 10 mg daily as occurring in setting of PF w/u in progress  - 09/01/2018   try dulera 100 2bid  - 09/01/2018 added gabapentin 100 mg tid  - 09/29/2018 still throat clearing /cough > rec 300 mg tid trial > could only tolerate 300 mg qhs as of 10/27/2018  - 10/27/2018  After extensive coaching inhaler device,  effectiveness =    25% so needs spacer / recoaching  - 12/15/2018  After extensive coaching inhaler device,  effectiveness =    75% with spacer - 03/23/2019  After extensive coaching inhaler device,  effectiveness =    90% so try dulera 100 one bid   - worse p covid 19 infection 04/2019 but did not req rx  - 10/11/2019 resume gabapentin titrate up to 300 mg bid > unable to use more than 200 mg hs - 01/17/2020 advised rechallenge with 100 mg in am added to 200 mg hs and stop dulera  She says can't afford dulera 100 but it's the least likely of available inhalers to cause cough and paradoxically she coughs to vomit each am when uses it and has predominantly restrictive not obst dz anyway so rec max rx for gerd, saba prn and off dulera for now   Advised: I spent extra time with pt today reviewing appropriate use of albuterol for prn use on exertion with the following points: 1) saba is for relief of sob that does not improve by walking a slower pace or resting but rather if the pt does not improve after trying this first. 2) If the pt is convinced, as many are, that saba helps recover from activity faster then it's easy to tell if this is the case by re-challenging : ie stop, take the inhaler, then p 5 minutes try the exact same activity (intensity of workload) that just caused the symptoms and see if they are substantially diminished or not after saba 3) if there is an activity that reproducibly causes the symptoms, try the saba 15 min before the activity on alternate  days   If in fact the saba really does help, then fine to continue to use it prn but advised may need to look closer at the maintenance regimen (symbicort generic 80 might be an option)  being used to achieve better control of airways disease with exertion.           Each maintenance medication was reviewed in detail including emphasizing most importantly the difference between maintenance and prns and under what circumstances the prns are to be triggered using an action plan format where appropriate.  Total time for H and P, chart review, counseling, teaching device and generating customized AVS unique to this office visit / charting = 22 min

## 2020-01-18 NOTE — Assessment & Plan Note (Signed)
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 With these limitations in mind, there is evidence suggestive of interstitial lung disease considered indeterminate for usual interstitial pneumonia (UIP)   - 08/04/2018   Walked RA  2 laps @  approx 287f each @ fast pace  stopped due to  End of study with sob and sats down to 82% - see ex hypoxemia - Collagen Vasc profile 08/04/2018  Nl RA, ANA, ESR=24  - HSP serology 09/01/2018  Neg  - on pred 5 mg /day :  12/15/2018   Walked RA  2 laps @  approx 2517feach @ avg pace  stopped due to  End of study, min sob, sats 93% at end   - 03/23/2019   Walked RA x one lap =  approx 250 ft @ nl pace - stopped due to end of study with sats of 96% at the end of the study and minimal sob on prednisone 5 mg qd so rec qod - Pos Covid testing 04/07/2019  No rx   - Stopped prednisone around 05/07/2019 - 07/09/19 HRCT Very similar findings in the lungs, albeit with slight improvement compared to the prior examination, as detailed above. Although the pattern is technically characterized as indeterminate for usual interstitial pneumonia (UIP), given the slight regression compared to the prior study, UIP is considered unlikely. The primary differential consideration is nonspecific interstitial pneumonia (NSIP).   -  10/11/2019   Walked RA  2 laps @ approx 25021fach @ moderate pace  stopped due to end of study,   no sob - sats 93% at end   Previously very prednisone responsive at low doses with flare of cough since covid but no change doe or sats.    The goal with a chronic steroid dependent illness is always arriving at the lowest effective dose that controls the disease/symptoms and not accepting a set "formula" which is based on statistics or guidelines that don't always take into account patient  variability or the natural hx of the dz in every individual patient, which may well vary over time.  For now therefore  I recommend the patient maintain  20 mg until better then 10 mg daily.

## 2020-02-04 ENCOUNTER — Other Ambulatory Visit: Payer: Self-pay | Admitting: Internal Medicine

## 2020-02-04 NOTE — Telephone Encounter (Signed)
Pt is requesting refill on GABAPENTIN 100MG  CAPSULES looks like it was discontinued on 10/11/19 pt has upcoming appt on 02/14/20 with Dr Melvyn Novas

## 2020-02-07 NOTE — Telephone Encounter (Signed)
Pt was left vm to inform gabapentin was sent to pharmacy

## 2020-02-14 ENCOUNTER — Ambulatory Visit (INDEPENDENT_AMBULATORY_CARE_PROVIDER_SITE_OTHER): Payer: Self-pay | Admitting: Internal Medicine

## 2020-02-14 ENCOUNTER — Other Ambulatory Visit: Payer: Self-pay

## 2020-02-14 ENCOUNTER — Encounter: Payer: Self-pay | Admitting: Internal Medicine

## 2020-02-14 DIAGNOSIS — R053 Chronic cough: Secondary | ICD-10-CM

## 2020-02-14 DIAGNOSIS — R918 Other nonspecific abnormal finding of lung field: Secondary | ICD-10-CM

## 2020-02-14 MED ORDER — GABAPENTIN 100 MG PO CAPS: ORAL_CAPSULE | ORAL | 1 refills | Status: DC

## 2020-02-14 MED ORDER — PREDNISONE 10 MG PO TABS: ORAL_TABLET | ORAL | 11 refills | Status: DC

## 2020-02-14 NOTE — Patient Instructions (Addendum)
El lmite mximo de prednisona es de 10 mg X 2 cada maana y el lmite mnimo de la mitad cada 3 das  ganaderos de jolley o salvavidas segn sea necesario en lugar de productos de menta o mentol para ayudar con las cosquillas en la garganta   Programe una visita de seguimiento en 3 meses, pero llame antes si es necesario; traiga todos los medicamentos       Prednisone ceiling is 10 mg  X 2 each am and the floor of one half every 3 days   Chartered certified accountant as needed instead of mint or menthol products to help with your throat tickle    Please schedule a follow up visit in 3 months but call sooner if needed  - please bring all meds

## 2020-02-14 NOTE — Progress Notes (Signed)
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 Pulmonary Consult    Referred by Dr Frederico Hamman. Pt c/o SOB since August 2019. She gets SOB walking short distances and also when she makes the bed.   Dyspnea:  Indolent onset doe summer 2019   Now [facility] = can't walk a nl pace on a flat grade s sob but does fine slow and flat  Cough: to point of vomiting / mucus is clear  Sleep: flat bed and one pillow nonoct cough  SABA use: don't help/ neither does flovent or tessalon prn  No significant arthritis but feels better/ cough less p advil  2 dogs/ no birds  No h/o rheumatologic symptoms, chemo or amiodarone or macrodantin exp. rec GERD diet  Omeprazole 20 mg Take 30- 60 min before your first and last meals of the day  Prednisone 10 mg x 2 daily until better then 1 daily until return  Please schedule a follow up office visit in 4 weeks, sooner if needed  with all medications /inhalers/ solutions in hand Please bring a family member to help interpret at this visit, thank you    09/01/2018  f/u ov/Olivia [name] re:  PF with gg changes / rx pred 20  Chief Complaint  Patient presents with  . Follow-up    Breathing is some better. She is still taking 20 mg prednisone. She is using her ventolin once daily on average.   Dyspnea:  50-100 ft  Cough: still cough on insp/ no more vomiting Sleeping: able to sleep flat /one  SABA use: helps sob and cough / qvar does not 02: none  rec Plan A = Automatic = dulera 100 Take 2  puffs first thing in am and then another 2 puffs about 12 hours later. Gabapentin 100 mg three times a day  Work on inhaler technique:   Plan B = Backup Only use your albuterol inhaler as a rescue medication   Please remember to go to the lab department   for your tests - we will call you with the results when they are available. Continue omeprazole 20 mg Take 30- 60 min before your first and last meals of the day  GERD diet    09/29/2018  f/u ov/Olivia [name] re:  PF / asthma improved on prednisone 10 mg qd  Chief Complaint  Patient presents with  . Follow-up    Breathing is doing well today. She has not had to use her rescue inhaler. She has had cough with clear sputum.    Dyspnea:  Walk up 30 min around neighborhood / trouble on hills only  Cough: dry /cough with mopping / deep breathing/ none noct  Sleeping: fine at bedtime flat/one pillo2 SABA use: none  02: none  rec Dulera 100 Take 2 puffs first thing in am and then another 2 puffs about 12 hours later.  Work on inhaler technique:  Increase gabapentin to 300 mg  three times daily  Prednisone 10 mg one on even, one half on odd days  Please schedule a follow up office visit in 4 weeks, sooner if needed     10/27/2018  f/u ov/Olivia [name] re: pf/ cough variant asthma with uacs  On gabapentin 300 mg hs and still on prednisone 10 mg daily   Chief Complaint  Patient presents with  . Follow-up    Cough has improved some.   Dyspnea:  Walking up to  30 min daily Cough: better esp while on gabapentin / min mucoid Sleeping: fine flat one pillow SABA use: very little rec Work on inhaler technique:   Try prednisone 10 mg one half daily - if worse then start back at 20 mg daily until better, then 10 mg per day for a week then back to 5 mg daily     12/15/2018  f/u ov/Olivia [name] re: pf/cough variant asthma prednisone 5 mg daily/  Gabapentin 200 in am and 300 at hs // omeprazole 20 mg bid ac  Chief Complaint  Patient presents with  . Follow-up     Breathing is better and less cough in the am. Cough still bothers her when she walks.   Dyspnea:  Walking up to 30 min s stopping  Cough: better but just when starts walking or hot air exp, not noct, occurs always on deep insp Sleeping: no problem lying flat one pillow  SABA use: none 02: not / not monitoring  rec Try again to take gabapentin to 300 mg twice  Daily if you can - if not, resume previous dose No change on prednisone dose for now = 5 mg daily  Ideally need to track 02 levels at peak activity at least once a week to monitor your lung condition Please schedule a follow up visit in 3 months but call sooner if needed    03/23/2019  f/u ov/Olivia [name] re:  PF/ cough variant asthma vs uacs on pred 5 mg daily and dulera 100 2bid  Chief Complaint  Patient presents with  . Follow-up    Breathing is overall doing better but she does notice some SOB when she wakes up in the morning. She has minimal cough.   Dyspnea:  Not as much with colder weather  Cough: better Sleeping:  No resp problems - one pillow  SABA use:  02:  None rec Keep walking as much as you can  Try taking prednisone 10 mg one half on even days only to see if it makes a difference Reduce your Dulera to one puff twice daily  GERD diet    05/03/19   POS COVID testing   10/11/2019  f/u ov/Olivia [name] re: prob nsip/ on dulera 100  2bid / omeprazole bid / off prednisone since 1st of 2021 Chief Complaint  Patient presents with  . Follow-up    4-6wk for cough. States she still has a cough, still coughing up phlegm. No one has a headache after coughing. Coughing has decreased. Clear phlegm.   Dyspnea:  Able to do housework same on pred as off / walking 15- 20 min 3 x weekly  Cough: daytime /minimal mucoid production Sleeping: fine one pillow SABA use: none  02: none/ not checking 02 sats  rec Resume gabapentin 100 mg twice daily stop if bothers you and build to 300 mg twice daily to see if helps the cough (the new pill is = 300  mg) . Make sure you check your oxygen saturations at highest level of activity  GERD diet    01/17/2020  f/u ov/Olivia [name] re: nsip  On dulera  With worse cough overall since covid 04/2019 / pred 5 mg daily  Chief Complaint  Patient presents with  . Follow-up    reports feeling okay but would like to discuss different inhaler due to expense of current on  Dyspnea:  Able to do housework p dulera  Cough: to point of vomiting ever since covid / mucoid  Sleeping: no cough at night at all  SABA use: none at present  02: none - sats only drop when walking uphills  rec Stop dulera for now  Only use your albuterol as a rescue medication  Try albuterol 15 min before an activity that you know would make you short of breath and see if it makes any difference and if makes none then don't take it after activity unless you can't catch your breath. Prednisone 10 mg x 2 daily until cough and breathing are back to normal then reduce back to 1 daily  Add gabapentin 100 mg each am     02/14/2020  f/u ov/Olivia [name] re: nsip /  pred down to 10 mg one half q 3 days tol off dulera fine Chief Complaint  Patient presents with  . Follow-up    cough with clear sputum in the mornings. Usually when she wakes up.  Dyspnea:  Ok for housework  Cough: better  Sleeping: fine SABA use: rarely despite stopped dulera 02: none    No obvious day to day or daytime variability or assoc excess/ purulent sputum or mucus plugs or hemoptysis or cp or chest tightness, subjective wheeze or overt sinus or hb symptoms.   Sleeping as above without nocturnal  or early am exacerbation  of respiratory  c/o's or need for noct saba. Also denies any obvious fluctuation of symptoms with weather or environmental changes or other aggravating or alleviating factors except as outlined above   No unusual exposure hx or h/o childhood pna/ asthma or knowledge of premature birth.  Current Allergies, Complete Past Medical History, Past Surgical  History, Family History, and Social History were reviewed in [employer] record.  ROS  The following are not active complaints unless bolded Hoarseness, sore throat, dysphagia, dental problems, itching, sneezing,  nasal congestion or discharge of excess mucus or purulent secretions, ear ache,   fever, chills, sweats, unintended wt loss or wt gain, classically pleuritic or exertional cp,  orthopnea pnd or arm/hand swelling  or leg swelling, presyncope, palpitations, abdominal pain, anorexia, nausea, vomiting, diarrhea  or change in bowel habits or change in bladder habits, change in stools or change in urine, dysuria, hematuria,  rash, arthralgias, visual complaints, headache, numbness, weakness or ataxia or problems with walking or coordination,  change in mood or  memory.        Current Meds  Medication Sig  . albuterol (VENTOLIN HFA) 108 (90 Base) MCG/ACT inhaler Inhale 2 puffs into the lungs every 6 (six) hours as needed for wheezing or shortness of breath.  . gabapentin (NEURONTIN) 100 MG capsule TAKE 1 CAPSULE BY MOUTH EVERY MORNING  . gabapentin (NEURONTIN) 300 MG capsule Take 1 capsule (300 mg total) by mouth 2 (two) times daily.  . mometasone-formoterol (DULERA) 100-5 MCG/ACT AERO Inhale 2 puffs into the lungs in the morning and at bedtime.  Marland Kitchen omeprazole (PRILOSEC) 20 MG capsule Take 30- 60 min before your first and last meals of the day  . Phenylephrine-DM-GG (MUCINEX FAST-MAX CONGEST COUGH) 2.5-5-100 MG/5ML  LIQD Take by mouth as needed.  . predniSONE (DELTASONE) 10 MG tablet Take one half daily                                  Objective:    amb pleasant latina nad   02/14/2020     158 01/17/2020       157 10/11/2019         155  03/23/2019     164  12/15/2018       162  10/27/2018       164  09/01/2018       160  08/04/18 158 lb (71.7 kg)  05/29/18 156 lb 6.4 oz (70.9 kg)  03/20/18 159 lb (72.1 kg)     Vital signs reviewed  02/14/2020  - Note  at rest 02 sats  95% on RA     HEENT : pt wearing mask not removed for exam due to covid -19 concerns.    NECK :  without JVD/Nodes/TM/ nl carotid upstrokes bilaterally   LUNGS: no acc muscle use,  Nl contour chest with min crackles 1/3 up  bilaterally without cough on insp or exp maneuvers   CV:  RRR  no s3 or murmur or increase in P2, and no edema   ABD:  soft and nontender with nl inspiratory excursion in the supine position. No bruits or organomegaly appreciated, bowel sounds nl  MS:  Nl gait/ ext warm without deformities, calf tenderness, cyanosis  - mild clubbing  No obvious joint restrictions   SKIN: warm and dry without lesions    NEURO:  alert, approp, nl sensorium with  no motor or cerebellar deficits apparent.             Assessment

## 2020-02-15 ENCOUNTER — Encounter: Payer: Self-pay | Admitting: Internal Medicine

## 2020-02-15 NOTE — Assessment & Plan Note (Signed)
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 With these limitations in mind, there is evidence suggestive of interstitial lung disease considered indeterminate for usual interstitial pneumonia (UIP)   - 08/04/2018   Walked RA  2 laps @  approx 213f each @ fast pace  stopped due to  End of study with sob and sats down to 82% - see ex hypoxemia - Collagen Vasc profile 08/04/2018  Nl RA, ANA, ESR=24  - HSP serology 09/01/2018  Neg  - on pred 5 mg /day :  12/15/2018   Walked RA  2 laps @  approx 2569feach @ avg pace  stopped due to  End of study, min sob, sats 93% at end   - 03/23/2019   Walked RA x one lap =  approx 250 ft @ nl pace - stopped due to end of study with sats of 96% at the end of the study and minimal sob on prednisone 5 mg qd so rec qod - Pos Covid testing 04/07/2019  No rx   - Stopped prednisone around 05/07/2019 - 07/09/19 HRCT Very similar findings in the lungs, albeit with slight improvement compared to the prior examination, as detailed above. Although the pattern is technically characterized as indeterminate for usual interstitial pneumonia (UIP), given the slight regression compared to the prior study, UIP is considered unlikely. The primary differential consideration is nonspecific interstitial pneumonia (NSIP).   -  10/11/2019   Walked RA  2 laps @ approx 25082fach @ moderate pace  stopped due to end of study,   no sob - sats 93% at end   The goal with a chronic steroid dependent illness is always arriving at the lowest effective dose that controls the disease/symptoms and not accepting a set "formula" which is based on statistics or guidelines that don't always take into account patient  variability or the natural hx of the dz in every individual patient, which may well vary over time.  For now therefore I recommend the patient maintain  Ceiling of 20 mg and floor of 5 mg q 3 d

## 2020-02-15 NOTE — Assessment & Plan Note (Signed)
Onset Aug 2019  neg resp to rx with short term pred/ flovent or saba  -  08/04/2018 trial of max rx for gerd plus pred 20 until better then 10 mg daily as occurring in setting of PF w/u in progress  - 09/01/2018   try dulera 100 2bid  - 09/01/2018 added gabapentin 100 mg tid  - 09/29/2018 still throat clearing /cough > rec 300 mg tid trial > could only tolerate 300 mg qhs as of 10/27/2018  - 10/27/2018  After extensive coaching inhaler device,  effectiveness =    25% so needs spacer / recoaching  - 12/15/2018  After extensive coaching inhaler device,  effectiveness =    75% with spacer - 03/23/2019  After extensive coaching inhaler device,  effectiveness =    90% so try dulera 100 one bid   - worse p covid 19 infection 04/2019 but did not req rx  - 10/11/2019 resume gabapentin titrate up to 300 mg bid > unable to use more than 200 mg hs - 01/17/2020 advised rechallenge with 100 mg in am added to 200 mg hs and stop dulera - 02/14/2020 well controlled on just 100 mg q am    Adequate control on present rx, reviewed in detail with pt > no change in rx needed           Each maintenance medication was reviewed in detail including emphasizing most importantly the difference between maintenance and prns and under what circumstances the prns are to be triggered using an action plan format where appropriate.  Total time for H and P, chart review, counseling, teaching device and generating customized AVS unique to this office visit / charting = 15 min

## 2020-02-22 ENCOUNTER — Ambulatory Visit: Payer: Self-pay | Admitting: Internal Medicine

## 2020-02-22 ENCOUNTER — Encounter: Payer: Self-pay | Admitting: Internal Medicine

## 2020-02-22 VITALS — BP 112/73 | HR 96 | Temp 98.2°F | Ht <= 58 in | Wt 157.6 lb

## 2020-02-22 DIAGNOSIS — J849 Interstitial pulmonary disease, unspecified: Secondary | ICD-10-CM

## 2020-02-22 DIAGNOSIS — Z0000 Encounter for general adult medical examination without abnormal findings: Secondary | ICD-10-CM

## 2020-02-22 DIAGNOSIS — Z23 Encounter for immunization: Secondary | ICD-10-CM

## 2020-02-22 NOTE — Progress Notes (Signed)
   CC: f/u ILD   HPI:  [name] is a 54 y.o. female with PMHx as listed below presenting for routine care and follow up of her ILD. She sees pulmonologist and notes that initially she was feeling like she was getting better. However, feels like she has had some step backs since getting COVID in December. Notes that her cough is still bothering her. Notes that her oxygen at baseline is normal but has slight drop in oxygen to 93% when coughing. Denies any dyspnea with walking on flat surfaces but does have some dyspnea with walking upstairs or up a hill. This is much improved from prior. She is able to participate in daily activities without any problems.   Past Medical History:  Diagnosis Date  . GERD (gastroesophageal reflux disease)    Review of Systems:  Negative except as stated in HPI.   Physical Exam:  Vitals:   02/22/20 1448  BP: 112/73  Pulse: 96  Temp: 98.2 F (36.8 C)  TempSrc: Oral  SpO2: 96%  Weight: 157 lb 9.6 oz (71.5 kg)  Height: 4\' 9"  (1.448 m)   Physical Exam  Constitutional: Appears well-developed and well-nourished. No distress.  HENT: Normocephalic and atraumatic, EOMI, conjunctiva normal, moist mucous membranes Cardiovascular: Normal rate, regular rhythm, S1 and S2 present, no murmurs, rubs, gallops.  Distal pulses intact Respiratory: No respiratory distress, no accessory muscle use.  Effort is normal.  Lungs are clear to auscultation bilaterally. Musculoskeletal: Normal bulk and tone.  No peripheral edema noted. Skin: Warm and dry.  No rash, erythema, lesions noted. Psychiatric: Normal mood and affect. Behavior is normal. Judgment and thought content normal.    Assessment & Plan:   See Encounters Tab for problem based charting.  Patient discussed with Dr. Dareen Piano

## 2020-02-22 NOTE — Patient Instructions (Addendum)
Olivia [name],  Fue un placer verte en la clnica. Hoy discutimos:  Recibi su vacuna contra la gripe hoy. Tambin orden la prueba FIT para la deteccin del cncer de colon. Enve esto al laboratorio.  Me alegro de que te sientas mejor! Contine tomando todos los medicamentos segn lo prescrito.   Si tiene alguna pregunta o inquietud, llame a Cleotis Nipper clnica al [phone] Lyndal Pulley las 9 am y las 5 pm y despus del horario de atencin llame al [phone] y pregunte por el residente de medicina interna de [place]. Si cree que tiene Engineering geologist, llame al [phone].  Gracias, esperamos poder ayudarlo a mantenerse saludable!

## 2020-02-23 NOTE — Assessment & Plan Note (Signed)
Patient has received both COVID vaccines.  She received flu vaccine today. FIT testing for colon cancer screening ordered at this visit

## 2020-02-23 NOTE — Assessment & Plan Note (Signed)
Ms Lorella Nimrod has been evaluated extensively for her chronic cough and was referred to pulmonology. High-resolution CT Chests previously concerning for UIP for which she was treated with steroids and albuterol prn. She notes that initially, she felt like she was getting better. However, feels like she has had some step backs since getting COVID in December. Notes that her cough is still bothering her. Notes that her oxygen at baseline is normal but has slight drop in oxygen to 93% when coughing. Denies any dyspnea with walking on flat surfaces but does have some dyspnea with walking upstairs or up a hill. However, notes that this is much improved from prior. She is able to participate in daily activities without any problems.  Most recent high-resolution CT Chest in March 2021 more consistent with NSIP and recommend for 12 month follow up high-resolution CT Chest.   Plan: Continue to follow with Dr Melvyn Novas Continue steroid and albuterol as instructed

## 2020-02-24 NOTE — Progress Notes (Signed)
Internal Medicine Clinic Attending ° °Case discussed with Dr. Aslam  At the time of the visit.  We reviewed the resident’s history and exam and pertinent patient test results.  I agree with the assessment, diagnosis, and plan of care documented in the resident’s note.  °

## 2020-03-27 ENCOUNTER — Telehealth: Payer: Self-pay

## 2020-03-27 ENCOUNTER — Encounter (HOSPITAL_COMMUNITY): Payer: Self-pay | Admitting: Emergency Medicine

## 2020-03-27 ENCOUNTER — Emergency Department (HOSPITAL_COMMUNITY): Payer: No Typology Code available for payment source

## 2020-03-27 ENCOUNTER — Inpatient Hospital Stay (HOSPITAL_COMMUNITY)
Admission: EM | Admit: 2020-03-27 | Discharge: 2020-03-29 | DRG: 196 | Disposition: A | Discharge status: Home / Self Care | Payer: No Typology Code available for payment source | Attending: Internal Medicine | Admitting: Internal Medicine

## 2020-03-27 ENCOUNTER — Other Ambulatory Visit: Payer: Self-pay

## 2020-03-27 DIAGNOSIS — Z7952 Long term (current) use of systemic steroids: Secondary | ICD-10-CM

## 2020-03-27 DIAGNOSIS — Z0000 Encounter for general adult medical examination without abnormal findings: Secondary | ICD-10-CM

## 2020-03-27 DIAGNOSIS — T380X5A Adverse effect of glucocorticoids and synthetic analogues, initial encounter: Secondary | ICD-10-CM | POA: Diagnosis present

## 2020-03-27 DIAGNOSIS — Z20822 Contact with and (suspected) exposure to covid-19: Secondary | ICD-10-CM | POA: Diagnosis present

## 2020-03-27 DIAGNOSIS — K219 Gastro-esophageal reflux disease without esophagitis: Secondary | ICD-10-CM | POA: Diagnosis present

## 2020-03-27 DIAGNOSIS — J849 Interstitial pulmonary disease, unspecified: Secondary | ICD-10-CM | POA: Diagnosis present

## 2020-03-27 DIAGNOSIS — Z8616 Personal history of COVID-19: Secondary | ICD-10-CM

## 2020-03-27 DIAGNOSIS — J479 Bronchiectasis, uncomplicated: Secondary | ICD-10-CM | POA: Diagnosis present

## 2020-03-27 DIAGNOSIS — Z9049 Acquired absence of other specified parts of digestive tract: Secondary | ICD-10-CM

## 2020-03-27 DIAGNOSIS — R590 Localized enlarged lymph nodes: Secondary | ICD-10-CM | POA: Diagnosis present

## 2020-03-27 DIAGNOSIS — R0603 Acute respiratory distress: Secondary | ICD-10-CM

## 2020-03-27 DIAGNOSIS — J9601 Acute respiratory failure with hypoxia: Secondary | ICD-10-CM | POA: Diagnosis present

## 2020-03-27 DIAGNOSIS — Z825 Family history of asthma and other chronic lower respiratory diseases: Secondary | ICD-10-CM

## 2020-03-27 DIAGNOSIS — J8410 Pulmonary fibrosis, unspecified: Principal | ICD-10-CM | POA: Diagnosis present

## 2020-03-27 DIAGNOSIS — R042 Hemoptysis: Secondary | ICD-10-CM | POA: Diagnosis present

## 2020-03-27 DIAGNOSIS — R0902 Hypoxemia: Secondary | ICD-10-CM

## 2020-03-27 DIAGNOSIS — Z79899 Other long term (current) drug therapy: Secondary | ICD-10-CM

## 2020-03-27 DIAGNOSIS — R739 Hyperglycemia, unspecified: Secondary | ICD-10-CM | POA: Diagnosis present

## 2020-03-27 DIAGNOSIS — F32A Depression, unspecified: Secondary | ICD-10-CM | POA: Diagnosis present

## 2020-03-27 DIAGNOSIS — E785 Hyperlipidemia, unspecified: Secondary | ICD-10-CM | POA: Diagnosis present

## 2020-03-27 DIAGNOSIS — Z77098 Contact with and (suspected) exposure to other hazardous, chiefly nonmedicinal, chemicals: Secondary | ICD-10-CM | POA: Diagnosis present

## 2020-03-27 HISTORY — DX: Acute respiratory distress: R06.03

## 2020-03-27 HISTORY — DX: Pulmonary fibrosis, unspecified: J84.10

## 2020-03-27 LAB — CBC
HCT: 44.8 % (ref 36.0–46.0)
Hemoglobin: 14.5 g/dL (ref 12.0–15.0)
MCH: 30 pg (ref 26.0–34.0)
MCHC: 32.4 g/dL (ref 30.0–36.0)
MCV: 92.8 fL (ref 80.0–100.0)
Platelets: 306 10*3/uL (ref 150–400)
RBC: 4.83 MIL/uL (ref 3.87–5.11)
RDW: 11.7 % (ref 11.5–15.5)
WBC: 8.9 10*3/uL (ref 4.0–10.5)
nRBC: 0 % (ref 0.0–0.2)

## 2020-03-27 LAB — BASIC METABOLIC PANEL
Anion gap: 11 (ref 5–15)
BUN: 9 mg/dL (ref 6–20)
CO2: 20 mmol/L — ABNORMAL LOW (ref 22–32)
Calcium: 9.6 mg/dL (ref 8.9–10.3)
Chloride: 106 mmol/L (ref 98–111)
Creatinine, Ser: 0.56 mg/dL (ref 0.44–1.00)
GFR, Estimated: >60 mL/min (ref >60)
Glucose, Bld: 138 mg/dL — ABNORMAL HIGH (ref 70–99)
Potassium: 3.8 mmol/L (ref 3.5–5.1)
Sodium: 137 mmol/L (ref 135–145)

## 2020-03-27 LAB — I-STAT BETA HCG BLOOD, ED (MC, WL, AP ONLY): I-stat hCG, quantitative: <5 m[IU]/mL (ref <5)

## 2020-03-27 LAB — RESPIRATORY PANEL BY RT PCR (FLU A&B, COVID)
Influenza A by PCR: NEGATIVE
Influenza B by PCR: NEGATIVE
SARS Coronavirus 2 by RT PCR: NEGATIVE

## 2020-03-27 LAB — TROPONIN I (HIGH SENSITIVITY)
Troponin I (High Sensitivity): <2 ng/L (ref <18)
Troponin I (High Sensitivity): 3 ng/L (ref <18)

## 2020-03-27 MED ORDER — POLYETHYLENE GLYCOL 3350 17 G PO PACK: 17.0000 g | PACK | Freq: Every day | ORAL | Status: DC | PRN

## 2020-03-27 MED ORDER — ACETAMINOPHEN 650 MG RE SUPP: 650.0000 mg | Freq: Four times a day (QID) | RECTAL | Status: DC | PRN

## 2020-03-27 MED ORDER — ALBUTEROL SULFATE HFA 108 (90 BASE) MCG/ACT IN AERS
4.0000 | INHALATION_SPRAY | Freq: Once | RESPIRATORY_TRACT | Status: AC
  Administered 2020-03-27: 4 via RESPIRATORY_TRACT
  Filled 2020-03-27: qty 6.7

## 2020-03-27 MED ORDER — IOHEXOL 300 MG/ML  SOLN
80.0000 mL | Freq: Once | INTRAMUSCULAR | Status: AC | PRN
  Administered 2020-03-27: 80 mL via INTRAVENOUS

## 2020-03-27 MED ORDER — ENOXAPARIN SODIUM 40 MG/0.4ML ~~LOC~~ SOLN
40.0000 mg | SUBCUTANEOUS | Status: DC
  Administered 2020-03-27 – 2020-03-28 (×2): 40 mg via SUBCUTANEOUS
  Filled 2020-03-27 (×2): qty 0.4

## 2020-03-27 MED ORDER — AZITHROMYCIN 250 MG PO TABS
500.0000 mg | ORAL_TABLET | Freq: Once | ORAL | Status: AC
  Administered 2020-03-27: 500 mg via ORAL
  Filled 2020-03-27: qty 2

## 2020-03-27 MED ORDER — AZITHROMYCIN 500 MG PO TABS
250.0000 mg | ORAL_TABLET | Freq: Every day | ORAL | Status: DC
  Administered 2020-03-28 – 2020-03-29 (×2): 250 mg via ORAL
  Filled 2020-03-27 (×2): qty 1

## 2020-03-27 MED ORDER — METHYLPREDNISOLONE SODIUM SUCC 125 MG IJ SOLR
125.0000 mg | Freq: Once | INTRAMUSCULAR | Status: AC
  Administered 2020-03-27: 125 mg via INTRAVENOUS
  Filled 2020-03-27: qty 2

## 2020-03-27 MED ORDER — PREDNISONE 20 MG PO TABS: 40.0000 mg | ORAL_TABLET | Freq: Every day | ORAL | Status: DC

## 2020-03-27 MED ORDER — ACETAMINOPHEN 325 MG PO TABS
650.0000 mg | ORAL_TABLET | Freq: Four times a day (QID) | ORAL | Status: DC | PRN
  Administered 2020-03-28: 650 mg via ORAL
  Filled 2020-03-27: qty 2

## 2020-03-27 MED ORDER — SODIUM CHLORIDE 0.9 % IV SOLN
1.0000 g | INTRAVENOUS | Status: DC
  Administered 2020-03-27 – 2020-03-28 (×2): 1 g via INTRAVENOUS
  Filled 2020-03-27 (×2): qty 10

## 2020-03-27 NOTE — ED Notes (Signed)
2nd Troponin

## 2020-03-27 NOTE — Telephone Encounter (Signed)
Agreed -

## 2020-03-27 NOTE — ED Provider Notes (Signed)
[facility] EMERGENCY DEPARTMENT Provider Note   CSN: [id number] Arrival date & time: 03/27/20  8127     History Chief Complaint  Patient presents with  . Shortness of Breath    Olivia [name] is a 54 y.o. female.  The history is provided by the patient and medical records. A language interpreter was used Rob Bunting).  Shortness of Breath  Olivia [name] is a 54 y.o. female who presents to the Emergency Department complaining of shortness of breath. She has a history of pulmonary fibrosis and presents the emergency department for acute on chronic shortness of breath. Over the last week she is experienced increased shortness of breath. Yesterday she had some left-sided back pain, now gone. She also yesterday had a cough productive of scant amount of blood he sputum. She reports increased coughing over the last week. No fevers, nausea, vomiting, numbness, leg swelling or pain. She takes chronic prednisone for her pulmonary fibrosis and did have the dose decreased about a month and a half ago. No history of blood clots. No known sick contacts. She has been fully vaccinated for COVID-19. She lives at home with her husband.    Past Medical History:  Diagnosis Date  . GERD (gastroesophageal reflux disease)   . Pulmonary fibrosis [facility])     Patient Active Problem List   Diagnosis Date Noted  . Acute respiratory distress 03/27/2020  . History of COVID-19 09/02/2019  . Interstitial pulmonary disease ([place]) 06/28/2019  . Healthcare maintenance 06/28/2019  . Elevated blood pressure reading 12/21/2018  . Dyslipidemia 12/21/2018  . Screening breast examination 11/26/2018  . Exercise hypoxemia 08/05/2018  . Pulmonary infiltrates 08/04/2018  . Chronic cough 03/20/2018  . Right nephrolithiasis 09/07/2017  . History of depression 02/14/2017  . Preventative health care 01/15/2013  . Microscopic hematuria 06/13/2008  . HEMORRHOIDS, NOS 07/03/2006    Past  Surgical History:  Procedure Laterality Date  . CHOLECYSTECTOMY  05/2009     OB History    Gravida  4   Para  3   Term  3   Preterm      AB  1   Living  3     SAB      TAB  1   Ectopic      Multiple      Live Births              Family History  Problem Relation Age of Onset  . Diabetes Paternal Grandfather   . Diabetes Father   . Hypertension Father   . Asthma Sister     Social History   Tobacco Use  . Smoking status: Never Smoker  . Smokeless tobacco: Never Used  Vaping Use  . Vaping Use: Never used  Substance Use Topics  . Alcohol use: Not Currently  . Drug use: No    Home Medications Prior to Admission medications   Medication Sig Start Date End Date Taking? Authorizing Provider  albuterol (VENTOLIN HFA) 108 (90 Base) MCG/ACT inhaler Inhale 2 puffs into the lungs every 6 (six) hours as needed for wheezing or shortness of breath. 05/17/19  Yes Tanda Rockers, MD  gabapentin (NEURONTIN) 100 MG capsule TAKE 1 CAPSULE BY MOUTH EVERY MORNING Patient taking differently: Take 100 mg by mouth daily.  02/14/20  Yes Tanda Rockers, MD  ibuprofen (ADVIL) 200 MG tablet Take 400 mg by mouth every 6 (six) hours as needed for moderate pain.   Yes [provider]  omeprazole (  PRILOSEC) 20 MG capsule Take 30- 60 min before your first and last meals of the day Patient taking differently: Take 20 mg by mouth 2 (two) times daily before a meal.  08/04/18  Yes Tanda Rockers, MD  predniSONE (DELTASONE) 10 MG tablet Take one half daily Patient taking differently: Take 5 mg by mouth daily with breakfast.  02/14/20  Yes Tanda Rockers, MD    Allergies    Patient has no known allergies.  Review of Systems   Review of Systems  Respiratory: Positive for shortness of breath.   All other systems reviewed and are negative.   Physical Exam Updated Vital Signs BP 112/74   Pulse 78   Temp 99 F (37.2 C) (Oral)   Resp (!) 28   Ht 4\' 9"  (1.448 m)   Wt 71.2  kg   LMP  (LMP Unknown)   SpO2 92%   BMI 33.97 kg/m   Physical Exam Vitals and nursing note reviewed.  Constitutional:      Appearance: She is well-developed.  HENT:     Head: Normocephalic and atraumatic.  Cardiovascular:     Rate and Rhythm: Normal rate and regular rhythm.     Heart sounds: No murmur heard.   Pulmonary:     Effort: Pulmonary effort is normal. No respiratory distress.     Comments: Tachypnea. Fine crackles to the mid lung bilaterally. There is an occasional wheezes in the right lung base. Abdominal:     Palpations: Abdomen is soft.     Tenderness: There is no abdominal tenderness. There is no guarding or rebound.  Musculoskeletal:        General: No swelling or tenderness.  Skin:    General: Skin is warm and dry.  Neurological:     Mental Status: She is alert and oriented to person, place, and time.  Psychiatric:        Behavior: Behavior normal.     ED Results / Procedures / Treatments   Labs (all labs ordered are listed, but only abnormal results are displayed) Labs Reviewed  BASIC METABOLIC PANEL - Abnormal; Notable for the following components:      Result Value   CO2 20 (*)    Glucose, Bld 138 (*)    All other components within normal limits  RESPIRATORY PANEL BY RT PCR (FLU A&B, COVID)  CBC  HIV ANTIBODY (ROUTINE TESTING W REFLEX)  BASIC METABOLIC PANEL  CBC  I-STAT BETA HCG BLOOD, ED ([facility], [facility], [facility] ONLY)  TROPONIN I (HIGH SENSITIVITY)  TROPONIN I (HIGH SENSITIVITY)    EKG EKG Interpretation  Date/Time:  Monday March 27 2020 09:48:33 EST Ventricular Rate:  97 PR Interval:  130 QRS Duration: 80 QT Interval:  334 QTC Calculation: 424 R Axis:   103 Text Interpretation: Normal sinus rhythm Rightward axis Nonspecific T wave abnormality Abnormal ECG Confirmed by Quintella Reichert [phone]) on 03/27/2020 4:47:59 PM   Radiology DG Chest 2 View  Result Date: 03/27/2020 CLINICAL DATA:  shortness of breath and low O2 sats EXAM: CHEST - 2  VIEW COMPARISON:  09/02/2019 chest radiograph and prior. FINDINGS: Hypoinflated lungs with diffuse interstitial prominence. No pneumothorax or pleural effusion. Cardiomediastinal silhouette is within normal limits. Patchy bibasilar opacities, slightly more conspicuous than prior exam. No acute osseous abnormality. IMPRESSION: Slightly increased conspicuity of bibasilar opacities. Differential includes chronic sequela, atelectasis or infiltrate. Electronically Signed   By: Primitivo Gauze M.D.   On: 03/27/2020 10:28   CT Angio Chest PE W/Cm &/  Or Wo Cm  Result Date: 03/27/2020 CLINICAL DATA:  54 year old female with concern for pulmonary embolism. EXAM: CT ANGIOGRAPHY CHEST WITH CONTRAST TECHNIQUE: Multidetector CT imaging of the chest was performed using the standard protocol during bolus administration of intravenous contrast. Multiplanar CT image reconstructions and MIPs were obtained to evaluate the vascular anatomy. CONTRAST:  78mL OMNIPAQUE IOHEXOL 300 MG/ML  SOLN COMPARISON:  Chest radiograph dated 03/27/2020 and CT dated 07/09/2019 FINDINGS: Evaluation of this exam is limited due to respiratory motion artifact. Cardiovascular: Top-normal cardiac size. No pericardial effusion. The thoracic aorta is unremarkable. Evaluation of the pulmonary arteries is limited due to respiratory motion artifact. No large or central pulmonary artery embolus identified. Mediastinum/Nodes: Mild bilateral hilar adenopathy, likely reactive. There is a small hiatal hernia. The esophagus and the thyroid gland are grossly unremarkable. No mediastinal fluid collection. Lungs/Pleura: There is diffuse interstitial prominence consistent with edema. Bilateral small clusters of ground-glass opacity throughout the lungs may represent edema or pneumonia. Clinical correlation is recommended. There is no pleural effusion pneumothorax. The central airways are patent. Upper Abdomen: Cholecystectomy. Musculoskeletal: No chest wall  abnormality. No acute or significant osseous findings. Review of the MIP images confirms the above findings. IMPRESSION: 1. No CT evidence of central pulmonary artery embolus. 2. Pulmonary edema with bilateral small clusters of ground-glass opacity throughout the lungs may represent edema or pneumonia. Clinical correlation is recommended. 3. Mild bilateral hilar adenopathy, likely reactive. Electronically Signed   By: Anner Crete M.D.   On: 03/27/2020 18:27    Procedures Procedures (including critical care time)  Medications Ordered in ED Medications  enoxaparin (LOVENOX) injection 40 mg (40 mg Subcutaneous Given 03/27/20 2245)  acetaminophen (TYLENOL) tablet 650 mg (has no administration in time range)    Or  acetaminophen (TYLENOL) suppository 650 mg (has no administration in time range)  polyethylene glycol (MIRALAX / GLYCOLAX) packet 17 g (has no administration in time range)  azithromycin (ZITHROMAX) tablet 250 mg (has no administration in time range)  cefTRIAXone (ROCEPHIN) 1 g in sodium chloride 0.9 % 100 mL IVPB (1 g Intravenous New Bag/Given 03/27/20 2244)  predniSONE (DELTASONE) tablet 40 mg (has no administration in time range)  methylPREDNISolone sodium succinate (SOLU-MEDROL) 125 mg/2 mL injection 125 mg (125 mg Intravenous Given 03/27/20 1728)  albuterol (VENTOLIN HFA) 108 (90 Base) MCG/ACT inhaler 4 puff (4 puffs Inhalation Given 03/27/20 1727)  azithromycin (ZITHROMAX) tablet 500 mg (500 mg Oral Given 03/27/20 1728)  iohexol (OMNIPAQUE) 300 MG/ML solution 80 mL (80 mLs Intravenous Contrast Given 03/27/20 1801)    ED Course  I have reviewed the triage vital signs and the nursing notes.  Pertinent labs & imaging results that were available during my care of the patient were reviewed by me and considered in my medical decision making (see chart for details).    MDM Rules/Calculators/A&P                         patient with history of interstitial lung disease here for  evaluation of increased shortness of breath, had her steroids decreased a month and a half ago. She has crackles on examination, tachypnea. Given her history of hemoptysis CTA was obtained, which was negative for PE. She was treated with Solu-Medrol, albuterol with no significant improvement in her symptoms. On ambulation after treatment she does desaturate to tonight 89% on room air and has significant increase in her work of breathing. Given her hypoxia recommend admission for ongoing treatment. Medicine consulted  for admission.  Final Clinical Impression(s) / ED Diagnoses Final diagnoses:  Hypoxia  Interstitial lung disease ([place])    Rx / DC Orders ED Discharge Orders    None       Quintella Reichert, MD 03/27/20 2319

## 2020-03-27 NOTE — H&P (Signed)
Date: 03/27/2020               Patient Name:  Olivia [name] MRN: [id number]  DOB: 09-27-1965 Age / Sex: 54 y.o., female   PCP: Jeralyn Bennett, MD         Medical Service: Internal Medicine Teaching Service         Attending Physician: Dr. Rebeca Alert Raynaldo Opitz, MD    First Contact: Dr. Cato Mulligan Pager: [phone]  Second Contact: Dr. Mitzi Hansen Pager: [phone]       After Hours (After 5p/  First Contact Pager: [phone]  weekends / holidays): Second Contact Pager: [phone]   Chief Complaint: shortness of breath  History of Present Illness:   Olivia [name] is a 53 year old woman with history of pulmonary fibrosis on chronic prednisone, GERD, COVID-19 pneumonia 04/2019, now vaccinated against [place] who presents to the [facility] with shortness of breath.  Phone interpretor used for this encounter.   The patient reports she was in her usual state of health until 1.5 wks ago when she developed shortness of breath, cough productive of clear sputum, and chest pressure. States at her baseline she only has shortness of breath with exertion up an incline, but her shortness of breath has been with any exertion. She became concerned this morning when she felt her heart racing, checked her vitals and pulse was elevated (130s) and oxygen saturation was low (low 80s%). Since yesterday, she has also had blood-tinged sputum. The chest pressure which at its onset was intermittent, associated with coughing, became persistent. She called the [facility] and was instructed to present to the ED.  Evaluated after being put on 2.5 L [place] and receiving IV steroids. Reports feeling improvement in her shortness of breath, however notes that still felt significant shortness of breath after just now ambulating to the restroom. States her cough is improved when she lays down and stays still or is able to cough up phlegm, worsened with deep breathing. States the chest pressure is a dullness which  originates over her mid-chest, worsens with coughing but not inspiration, and radiates towards her left chest. Denies fever/chills/night sweats, headache, congestion, sore throat, N/V/D, dysuria, weakness, dizziness. Denies sick contacts. No recent lengthy car or plane trips. Appetite has been normal, hydrating normally. States had an episode of post-tussive emesis after eating and having an episode of coughing. States her acid reflux was worse than it has been yesterday evening.   Saw her pulmonologist last month (02/14/20) who noted improvement in her symptoms, recommended self-titration of her chronic steroids. She reports she has been taking 5 mg prednisone every other day, decreased from 10 mg daily before.  ED Course: Afebrile, RR 18-30, P 60s-90s, BP 110s-130s/80s-90s, SpO2 >93% on 2L [place]. COVID and Flu A/B negative. BMP wnl except for bicarb 20. CBC unremarkable, without leukocytosis, Hgb 14.5. Troponin <2 then 3. CXR demonstrated slightly increased conspicuity of bibasilar opacities. CT chest PE protocol negative for PE, showed pulmonary edema and mild bilateral hilar adenopathy. Patient given solumedrol 125 mg IV, albuterol, azithromycin 500 mg.   Meds:  Current Meds  Medication Sig  . albuterol (VENTOLIN HFA) 108 (90 Base) MCG/ACT inhaler Inhale 2 puffs into the lungs every 6 (six) hours as needed for wheezing or shortness of breath.  . gabapentin (NEURONTIN) 100 MG capsule TAKE 1 CAPSULE BY MOUTH EVERY MORNING (Patient taking differently: Take 100 mg by mouth daily. )  . ibuprofen (ADVIL) 200 MG tablet Take 400 mg by mouth every  6 (six) hours as needed for moderate pain.  Marland Kitchen omeprazole (PRILOSEC) 20 MG capsule Take 30- 60 min before your first and last meals of the day (Patient taking differently: Take 20 mg by mouth 2 (two) times daily before a meal. )  . predniSONE (DELTASONE) 10 MG tablet Take one half daily (Patient taking differently: Take 5 mg by mouth daily with breakfast. 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 No history of smoking cigarettes or marijuana. No cocaine or alcohol use.  Family History: No known family history of lung disease or cancer.   Allergies: Allergies as of 03/27/2020  . (No Known Allergies)   Past Medical History:  Diagnosis Date  . GERD (gastroesophageal reflux disease)   . Pulmonary fibrosis ([place])     Review of Systems: A complete ROS was negative except as per HPI.   Physical Exam: Blood pressure (!) 131/93, pulse 88, temperature 99 F (37.2 C), temperature source Oral, resp. rate (!) 31, height 4\' 9"  (1.448 m), weight 71.2 kg, SpO2 96 %. Constitutional: pleasant and well- though tired-appearing woman sitting up in bed, in no acute distress HENT: normocephalic atraumatic, mucous membranes moist Eyes: conjunctiva non-erythematous Neck: supple Cardiovascular: regular rate and rhythm, no m/r/g, no lower extremity swelling Pulmonary/Chest: normal work of breathing on 2.5 L [place], tachypnea, crackles bilaterally to the mid lung fields, no rhonchi; frequent coughing, especially following attempted deep inspiration; bilateral clubbing Abdominal: soft, non-tender, non-distended MSK: normal bulk and tone Neurological: alert & oriented x 3 Skin: warm and dry Psych: normal mood and affect   Labs: CBC    Component Value Date/Time   WBC 8.9 03/27/2020 1000   RBC 4.83 03/27/2020 1000   HGB 14.5 03/27/2020 1000   HGB 13.8 12/26/2016 1643   HCT 44.8 03/27/2020 1000   HCT 41.1 12/26/2016 1643   PLT 306 03/27/2020 1000   PLT 268 12/26/2016 1643   MCV 92.8 03/27/2020 1000   MCV 89 12/26/2016 1643   MCH 30.0 03/27/2020 1000   MCHC 32.4 03/27/2020 1000   RDW 11.7 03/27/2020 1000   RDW 13.0 12/26/2016 1643   LYMPHSABS 2.3 08/04/2018 1647   MONOABS 0.5 08/04/2018 1647   EOSABS 0.4 08/04/2018 1647   BASOSABS 0.1 08/04/2018 1647     CMP     Component Value Date/Time   NA 137 03/27/2020 1000   NA 137  12/26/2016 1643   K 3.8 03/27/2020 1000   CL 106 03/27/2020 1000   CO2 20 (L) 03/27/2020 1000   GLUCOSE 138 (H) 03/27/2020 1000   BUN 9 03/27/2020 1000   BUN 14 12/26/2016 1643   CREATININE 0.56 03/27/2020 1000   CREATININE 0.52 05/23/2014 0855   CALCIUM 9.6 03/27/2020 1000   PROT 7.8 08/22/2017 0546   ALBUMIN 3.9 08/22/2017 0546   AST 28 08/22/2017 0546   ALT 27 08/22/2017 0546   ALKPHOS 79 08/22/2017 0546   BILITOT 0.8 08/22/2017 0546   GFRNONAA >60 03/27/2020 1000   GFRAA >60 08/22/2017 0546    Imaging: DG Chest 2 View  Result Date: 03/27/2020 CLINICAL DATA:  shortness of breath and low O2 sats EXAM: CHEST - 2 VIEW COMPARISON:  09/02/2019 chest radiograph and prior. FINDINGS: Hypoinflated lungs with diffuse interstitial prominence. No pneumothorax or pleural effusion. Cardiomediastinal silhouette is within normal limits. Patchy bibasilar opacities, slightly more conspicuous than prior exam. No acute osseous abnormality. IMPRESSION: Slightly increased conspicuity of bibasilar opacities. Differential includes chronic sequela, atelectasis or infiltrate. Electronically  Signed   By: Primitivo Gauze M.D.   On: 03/27/2020 10:28   CT Angio Chest PE W/Cm &/Or Wo Cm  Result Date: 03/27/2020 CLINICAL DATA:  54 year old female with concern for pulmonary embolism. EXAM: CT ANGIOGRAPHY CHEST WITH CONTRAST TECHNIQUE: Multidetector CT imaging of the chest was performed using the standard protocol during bolus administration of intravenous contrast. Multiplanar CT image reconstructions and MIPs were obtained to evaluate the vascular anatomy. CONTRAST:  11mL OMNIPAQUE IOHEXOL 300 MG/ML  SOLN COMPARISON:  Chest radiograph dated 03/27/2020 and CT dated 07/09/2019 FINDINGS: Evaluation of this exam is limited due to respiratory motion artifact. Cardiovascular: Top-normal cardiac size. No pericardial effusion. The thoracic aorta is unremarkable. Evaluation of the pulmonary arteries is limited due to  respiratory motion artifact. No large or central pulmonary artery embolus identified. Mediastinum/Nodes: Mild bilateral hilar adenopathy, likely reactive. There is a small hiatal hernia. The esophagus and the thyroid gland are grossly unremarkable. No mediastinal fluid collection. Lungs/Pleura: There is diffuse interstitial prominence consistent with edema. Bilateral small clusters of ground-glass opacity throughout the lungs may represent edema or pneumonia. Clinical correlation is recommended. There is no pleural effusion pneumothorax. The central airways are patent. Upper Abdomen: Cholecystectomy. Musculoskeletal: No chest wall abnormality. No acute or significant osseous findings. Review of the MIP images confirms the above findings. IMPRESSION: 1. No CT evidence of central pulmonary artery embolus. 2. Pulmonary edema with bilateral small clusters of ground-glass opacity throughout the lungs may represent edema or pneumonia. Clinical correlation is recommended. 3. Mild bilateral hilar adenopathy, likely reactive. Electronically Signed   By: Anner Crete M.D.   On: 03/27/2020 18:27    EKG: personally reviewed my interpretation is normal sinus rhythm  Assessment & Plan by Problem: Active Problems:   Acute respiratory distress   Ms. Stefhanie Kachmar is a 54 year old woman with history of pulmonary fibrosis on chronic prednisone, GERD, COVID-19 pneumonia 04/2019, now vaccinated against COVID who presents with shortness of breath associated with cough and chest tightness and admitted for further evaluation and stabilization.   Dyspnea Acute on chronic cough Chest tightness Blood-tinged sputum History of pulmonary fibrosis  Patient presents with 1.5-week history of dyspnea associated with acute on chronic cough, chest tightness, blood-tinged sputum, and new oxygen requirement (2 L [place]). Trops negative and EKG without signs of ischemia. Labs unremarkable, no leukocytosis. Imaging negative for  PE, suggestive of pulmonary edema. Dyspnea improved with administration of solumedrol and albuterol in the ED.   Follows with [facility] (Dr. Melvyn Novas). Onset of symptoms 12/2017. HRCT 06/2018 considered indeterminate for usual interstitial pneumonia (UIP). 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 02/14/20 appt instructed to continue gabapentin 100 mg daily in the AM. For her steroid therapy, patient instructed to maintain ceiling of 20 mg and floor of 5 mg q 3 d. She reports taking 5 mg every other day since her last appointment, down from 10 mg daily.  Differential includes pneumonia, though patient is afebrile, no leukocytosis, imaging less consistent. Will treat empirically for pneumonia. Given history of chronic steroid use cannot use procalcitonin to guide abx treatment. Most likely is exacerbation vs progression of pulmonary fibrosis. Will treat with steroids based on treatment guidelines for NSIP and monitor for improvement. - Empiric ceftriaxone and azithromycin - s/p Solumedrol IV 125 mg in the ED - Prednisone 40 mg PO daily - Continue home albuterol 2 puffs q6h PRN - Continue home gabapentin 100 mg qAM for chronic cough -  Wean oxygen as tolerated  GERD On omeprazole 20 mg twice daily. - Continue home omeprazole 20 mg twice daily   Diet: Normal VTE: Enoxaparin IVF: None Code: Full  Prior to Admission Living Arrangement: Home Anticipated Discharge Location: Home Barriers to Discharge: stabilization of pulmonary disease  Dispo: Admit patient to Inpatient with expected length of stay greater than 2 midnights.  Signed: Alexandria Lodge, MD PGY-1 Internal Medicine Teaching Service Pager: [phone] 03/27/2020

## 2020-03-27 NOTE — Telephone Encounter (Signed)
Received message from office manager that [place] called regarding patient and to call [facility] Interpreter back.  TC to [facility] interpreter, Rob Bunting, who places 3 way call to patient.  Patient c/o O2 sats dropping to 83% this morning and her pulse rate jumping to 130-150's.  States she is SOB with activity and has been "spitting up blood in phlegm" since yesterday.  Pt states her O2 sats now are 92%.  SOB is noted during conversation with RN.  Patient also is complaining of chest pain X 2 days which was intermittent, but states chest pain is now constant.  Pt is requesting an appt in office. RN instructed patient to call 911 now for ED transport & evaluation.  Pt upset and states no, she doesn't want to go to the ED.  RN informed patient with above symptoms, this is not a clinic appropriate visit and she needs to be evaluated in ED now as above symptoms could be life threatening.  Patient agreed. SChaplin, RN,BSN

## 2020-03-27 NOTE — ED Triage Notes (Signed)
Pt reports sob and low O2 sats at home (70s) denies use of supplemental O2, hx of pulmonary fibrosis. Endorses productive cough with bloody phlegm. O2 sats WNL in triage. Pt speaking in full sentences.

## 2020-03-28 ENCOUNTER — Inpatient Hospital Stay (HOSPITAL_COMMUNITY): Payer: No Typology Code available for payment source

## 2020-03-28 DIAGNOSIS — R0603 Acute respiratory distress: Secondary | ICD-10-CM

## 2020-03-28 DIAGNOSIS — J849 Interstitial pulmonary disease, unspecified: Secondary | ICD-10-CM

## 2020-03-28 DIAGNOSIS — R0602 Shortness of breath: Secondary | ICD-10-CM

## 2020-03-28 LAB — GLUCOSE, CAPILLARY
Glucose-Capillary: 83 mg/dL (ref 70–99)
Glucose-Capillary: 103 mg/dL — ABNORMAL HIGH (ref 70–99)
Glucose-Capillary: 153 mg/dL — ABNORMAL HIGH (ref 70–99)

## 2020-03-28 LAB — CBC
HCT: 44.3 % (ref 36.0–46.0)
Hemoglobin: 14 g/dL (ref 12.0–15.0)
MCH: 30.1 pg (ref 26.0–34.0)
MCHC: 31.6 g/dL (ref 30.0–36.0)
MCV: 95.3 fL (ref 80.0–100.0)
Platelets: 278 10*3/uL (ref 150–400)
RBC: 4.65 MIL/uL (ref 3.87–5.11)
RDW: 11.7 % (ref 11.5–15.5)
WBC: 5.5 10*3/uL (ref 4.0–10.5)
nRBC: 0 % (ref 0.0–0.2)

## 2020-03-28 LAB — ECHOCARDIOGRAM COMPLETE
Area-P 1/2: 3.65 cm2
Height: 58.8 in
S' Lateral: 2.5 cm
Weight: 2465.62 oz

## 2020-03-28 LAB — BASIC METABOLIC PANEL
Anion gap: 13 (ref 5–15)
BUN: 12 mg/dL (ref 6–20)
CO2: 19 mmol/L — ABNORMAL LOW (ref 22–32)
Calcium: 9.3 mg/dL (ref 8.9–10.3)
Chloride: 102 mmol/L (ref 98–111)
Creatinine, Ser: 0.62 mg/dL (ref 0.44–1.00)
GFR, Estimated: >60 mL/min (ref >60)
Glucose, Bld: 234 mg/dL — ABNORMAL HIGH (ref 70–99)
Potassium: 3.7 mmol/L (ref 3.5–5.1)
Sodium: 134 mmol/L — ABNORMAL LOW (ref 135–145)

## 2020-03-28 LAB — HEMOGLOBIN A1C
Hgb A1c MFr Bld: 5.4 % (ref 4.8–5.6)
Mean Plasma Glucose: 108.28 mg/dL

## 2020-03-28 LAB — HIV ANTIBODY (ROUTINE TESTING W REFLEX): HIV Screen 4th Generation wRfx: NONREACTIVE

## 2020-03-28 LAB — BRAIN NATRIURETIC PEPTIDE: B Natriuretic Peptide: 59 pg/mL (ref 0.0–100.0)

## 2020-03-28 MED ORDER — ALBUTEROL SULFATE HFA 108 (90 BASE) MCG/ACT IN AERS
2.0000 | INHALATION_SPRAY | Freq: Four times a day (QID) | RESPIRATORY_TRACT | Status: DC | PRN
  Administered 2020-03-28: 2 via RESPIRATORY_TRACT

## 2020-03-28 MED ORDER — INSULIN ASPART 100 UNIT/ML ~~LOC~~ SOLN: 0.0000 [IU] | Freq: Three times a day (TID) | SUBCUTANEOUS | Status: DC

## 2020-03-28 MED ORDER — FUROSEMIDE 10 MG/ML IJ SOLN
20.0000 mg | Freq: Once | INTRAMUSCULAR | Status: AC
  Administered 2020-03-28: 20 mg via INTRAVENOUS
  Filled 2020-03-28: qty 2

## 2020-03-28 MED ORDER — INSULIN ASPART 100 UNIT/ML ~~LOC~~ SOLN: 0.0000 [IU] | Freq: Every day | SUBCUTANEOUS | Status: DC

## 2020-03-28 MED ORDER — PANTOPRAZOLE SODIUM 40 MG PO TBEC
40.0000 mg | DELAYED_RELEASE_TABLET | Freq: Every day | ORAL | Status: DC
  Administered 2020-03-28 – 2020-03-29 (×2): 40 mg via ORAL
  Filled 2020-03-28 (×2): qty 1

## 2020-03-28 MED ORDER — PERFLUTREN LIPID MICROSPHERE
1.0000 mL | INTRAVENOUS | Status: AC | PRN
  Administered 2020-03-28: 2 mL via INTRAVENOUS
  Filled 2020-03-28: qty 10

## 2020-03-28 MED ORDER — GUAIFENESIN-DM 100-10 MG/5ML PO SYRP
5.0000 mL | ORAL_SOLUTION | ORAL | Status: DC | PRN
  Administered 2020-03-28: 5 mL via ORAL
  Filled 2020-03-28: qty 5

## 2020-03-28 MED ORDER — GABAPENTIN 100 MG PO CAPS
100.0000 mg | ORAL_CAPSULE | Freq: Every day | ORAL | Status: DC
  Administered 2020-03-28 – 2020-03-29 (×2): 100 mg via ORAL
  Filled 2020-03-28 (×2): qty 1

## 2020-03-28 MED ORDER — PREDNISONE 20 MG PO TABS
40.0000 mg | ORAL_TABLET | Freq: Every day | ORAL | Status: DC
  Administered 2020-03-28 – 2020-03-29 (×2): 40 mg via ORAL
  Filled 2020-03-28 (×2): qty 2

## 2020-03-28 NOTE — Progress Notes (Addendum)
   03/28/20 2110  Provider Notification  Provider Name/Title [place],MD  Date Provider Notified 03/28/20  Time Provider Notified 2110  Notification Type Page  Notification Reason Requested by patient/family (cough meds)  Response Other (Comment) (waiting)     Instructed by Dr Shon Baton to give albuterol.

## 2020-03-28 NOTE — Progress Notes (Signed)
Patient received to the unit. Patient is alert and oriented x4. Iv in place. Skin assessment done with another nurse. No skin breakdown noted on examination. Given instructions about call bell and phone. Bed in low position and call bell in reach.

## 2020-03-28 NOTE — Consult Note (Signed)
Name: Olivia [name] MRN: [id number] DOB: [birth year]    ADMISSION DATE:  03/27/2020 CONSULTATION DATE: 03/28/2020  REFERRING MD : Teaching service  CHIEF COMPLAINT: Shortness of breath and cough  BRIEF PATIENT DESCRIPTION: Middle-aged Hispanic female no acute distress.   SIGNIFICANT EVENTS    STUDIES:  Ct 11/22 1. No CT evidence of central pulmonary artery embolus. 2. Pulmonary edema with bilateral small clusters of ground-glass opacity throughout the lungs may represent edema or pneumonia. Clinical correlation is recommended. 3. Mild bilateral hilar adenopathy, likely reactive. History of present illness:  54 year old Hispanic female who is followed by Dr. 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  She has never smoked.  She has been in [place] since 2005.  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  She is married her husband continues to smoke but outside.  She was in her usual state of health until approximately 1 week ago when she noticed increasing shortness of breath along with worsening of her chronic cough to the point she was having chest wall pain from the left chest and mid sternum.  She is noted to be increasing work of breathing along with feeling short of breath she noticed her heart racing and was evaluated in [facility] emergency department found to have a heart rate of 130.  Her steroids have been decreased to 5 mg daily and she was given antimicrobial therapy to cover pneumonia along with 1 dose of IV Solu-Medrol.  She had been placed on 2.5 L/min nasal cannula with improvement of all of her symptoms of being tachycardic and short of breath.  She notes she has a chronic cough usually clear she did have 1 incident of blood tinged sputum prior to admission.  She denies fevers, chills, night sweats or any other signs of infection.  She had CT  angio of the chest performed 03/27/2020 with no evidence of PE.  Positive for pulmonary edema bilateral clusters of groundglass opacities throughout the lungs.  We note some bronchiectasis especially in the right middle and upper lobe.  Note she speaks English poorly the investigation was done with the use of the Spanish interpreter via the phone.  At the time of this note she is awake alert no acute distress and reports feeling better. PAST MEDICAL HISTORY :   has a past medical history of GERD (gastroesophageal reflux disease) and Pulmonary fibrosis ([place]).  has a past surgical history that includes Cholecystectomy (05/2009). Prior to Admission medications   Medication Sig Start Date End Date Taking? Authorizing Provider  albuterol (VENTOLIN HFA) 108 (90 Base) MCG/ACT inhaler Inhale 2 puffs into the lungs every 6 (six) hours as needed for wheezing or shortness of breath. 05/17/19  Yes Tanda Rockers, MD  gabapentin (NEURONTIN) 100 MG capsule TAKE 1 CAPSULE BY MOUTH EVERY MORNING Patient taking differently: Take 100 mg by mouth daily.  02/14/20  Yes Tanda Rockers, MD  ibuprofen (ADVIL) 200 MG tablet Take 400 mg by mouth every 6 (six) hours as needed for moderate pain.   Yes [provider]  omeprazole (PRILOSEC) 20 MG capsule Take 30- 60 min before your first and last meals of the day Patient taking differently: Take 20 mg by mouth 2 (two) times daily before a meal.  08/04/18  Yes Tanda Rockers, MD  predniSONE (DELTASONE) 10 MG tablet Take one half daily Patient taking differently: Take 5 mg by  mouth daily with breakfast.  02/14/20  Yes Tanda Rockers, MD   No Known Allergies  FAMILY HISTORY:  family history includes Asthma in her sister; Diabetes in her father and paternal grandfather; Hypertension in her father. SOCIAL HISTORY:  reports that she has never smoked. She has never used smokeless tobacco. She reports previous alcohol use. She reports that she does not use  drugs.  REVIEW OF SYSTEMS:   10 point review of system taken, please see HPI for positives and negatives.   SUBJECTIVE:  Awake alert no acute distress VITAL SIGNS: Temp:  [97.6 F (36.4 C)-99 F (37.2 C)] 97.6 F (36.4 C) (11/23 0717) Pulse Rate:  [57-97] 57 (11/23 0717) Resp:  [15-31] 20 (11/23 0447) BP: (101-135)/(66-94) 104/67 (11/23 0717) SpO2:  [92 %-100 %] 95 % (11/23 0717) FiO2 (%):  [28 %] 28 % (11/22 2118) Weight:  [69.9 kg-71.2 kg] 69.9 kg (11/23 0025)  PHYSICAL EXAMINATION: General: Well-nourished well-developed female currently on 2.5 L nasal cannula without acute distress Neuro: No focal defects are appreciated HEENT: No JVD or lymphadenopathy is noted Cardiovascular: Heart sounds are regular regular rate rhythm Lungs: Bibasilar crackles are appreciated.  Notes tenderness along the left sternal border to palpation Abdomen: Soft nontender positive bowel sounds Musculoskeletal: Intact Skin: Warm and dry  Recent Labs  Lab 03/27/20 1000 03/28/20 0207  NA 137 134*  K 3.8 3.7  CL 106 102  CO2 20* 19*  BUN 9 12  CREATININE 0.56 0.62  GLUCOSE 138* 234*   Recent Labs  Lab 03/27/20 1000 03/28/20 0207  HGB 14.5 14.0  HCT 44.8 44.3  WBC 8.9 5.5  PLT 306 278   DG Chest 2 View  Result Date: 03/27/2020 CLINICAL DATA:  shortness of breath and low O2 sats EXAM: CHEST - 2 VIEW COMPARISON:  09/02/2019 chest radiograph and prior. FINDINGS: Hypoinflated lungs with diffuse interstitial prominence. No pneumothorax or pleural effusion. Cardiomediastinal silhouette is within normal limits. Patchy bibasilar opacities, slightly more conspicuous than prior exam. No acute osseous abnormality. IMPRESSION: Slightly increased conspicuity of bibasilar opacities. Differential includes chronic sequela, atelectasis or infiltrate. Electronically Signed   By: Primitivo Gauze M.D.   On: 03/27/2020 10:28   CT Angio Chest PE W/Cm &/Or Wo Cm  Result Date: 03/27/2020 CLINICAL DATA:   54 year old female with concern for pulmonary embolism. EXAM: CT ANGIOGRAPHY CHEST WITH CONTRAST TECHNIQUE: Multidetector CT imaging of the chest was performed using the standard protocol during bolus administration of intravenous contrast. Multiplanar CT image reconstructions and MIPs were obtained to evaluate the vascular anatomy. CONTRAST:  69mL OMNIPAQUE IOHEXOL 300 MG/ML  SOLN COMPARISON:  Chest radiograph dated 03/27/2020 and CT dated 07/09/2019 FINDINGS: Evaluation of this exam is limited due to respiratory motion artifact. Cardiovascular: Top-normal cardiac size. No pericardial effusion. The thoracic aorta is unremarkable. Evaluation of the pulmonary arteries is limited due to respiratory motion artifact. No large or central pulmonary artery embolus identified. Mediastinum/Nodes: Mild bilateral hilar adenopathy, likely reactive. There is a small hiatal hernia. The esophagus and the thyroid gland are grossly unremarkable. No mediastinal fluid collection. Lungs/Pleura: There is diffuse interstitial prominence consistent with edema. Bilateral small clusters of ground-glass opacity throughout the lungs may represent edema or pneumonia. Clinical correlation is recommended. There is no pleural effusion pneumothorax. The central airways are patent. Upper Abdomen: Cholecystectomy. Musculoskeletal: No chest wall abnormality. No acute or significant osseous findings. Review of the MIP images confirms the above findings. IMPRESSION: 1. No CT evidence of central pulmonary artery embolus. 2.  Pulmonary edema with bilateral small clusters of ground-glass opacity throughout the lungs may represent edema or pneumonia. Clinical correlation is recommended. 3. Mild bilateral hilar adenopathy, likely reactive. Electronically Signed   By: Anner Crete M.D.   On: 03/27/2020 18:27    ASSESSMENT: Active Problems:   Acute respiratory distress   Chronic cough refractory to treatment   ILD steroid dependent   Hx of Covid 19  04/2019    Discussion: 54 year old Hispanic female who is followed by Dr. 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  She has never smoked.  She has been in [place] since 2005.  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  She is married her husband continues to smoke but outside.  She was in her usual state of health until approximately 1 week ago when she noticed increasing shortness of breath along with worsening of her chronic cough to the point she was having chest wall pain from the left chest and mid sternum.  She is noted to be increasing work of breathing along with feeling short of breath she noticed her heart racing and was evaluated in [facility] emergency department found to have a heart rate of 130.  Her steroids have been decreased to 5 mg daily and she was given antimicrobial therapy to cover pneumonia along with 1 dose of IV Solu-Medrol.  She had been placed on 2.5 L/min nasal cannula with improvement of all of her symptoms of being tachycardic and short of breath.  She notes she has a chronic cough usually clear she did have 1 incident of blood tinged sputum prior to admission.  She denies fevers, chills, night sweats or any other signs of infection.  She had CT angio of the chest performed 03/27/2020 with no evidence of PE.  Positive for pulmonary edema bilateral clusters of groundglass opacities throughout the lungs.  We note some bronchiectasis especially in the right middle and upper lobe.  Note she speaks English poorly the investigation was done with the use of the Spanish interpreter via the phone.  At the time of this note she is awake alert no acute distress and reports feeling better.      PLAN: Agree with steroids most likely can take her back to prednisone at 40 mg a day and begin taper  Attempt to get sputum culture.  Agree with 2D echo  Questionable  role for fiberoptic optic bronchoscopy in the future.  Serial chest CT  Follow-up with Dr. Melvyn Novas as an outpatient.  Treat gastroesophageal reflux disease.  Further evaluation and recommendations per Dr. Silas Flood.   Richardson Landry Johsua Shevlin ACNP Acute Care Nurse Practitioner [place] Please consult Amion 03/28/2020, 8:39 AM

## 2020-03-28 NOTE — Plan of Care (Signed)
  Problem: Skin Integrity: Goal: Risk for impaired skin integrity will decrease Outcome: Progressing   Problem: Safety: Goal: Ability to remain free from injury will improve Outcome: Progressing   Problem: Pain Managment: Goal: General experience of comfort will improve Outcome: Progressing   Problem: Coping: Goal: Level of anxiety will decrease Outcome: Progressing   

## 2020-03-28 NOTE — Progress Notes (Signed)
  Echocardiogram 2D Echocardiogram has been performed.  Olivia [name] 03/28/2020, 9:28 AM

## 2020-03-28 NOTE — Progress Notes (Signed)
Subjective:   Overnight, no further acute events.  This morning, patient reports that she feels better and her shortness of breath is much improved. She denies any new or worsening symptoms. She denies additional episodes of hemoptysis overnight.   Objective:  Vital signs in last 24 hours: Vitals:   03/28/20 0025 03/28/20 0400 03/28/20 0447 03/28/20 0717  BP: 123/68  101/68 104/67  Pulse: 60 60 75 (!) 57  Resp: 20 18 20    Temp: 97.7 F (36.5 C)  97.8 F (36.6 C) 97.6 F (36.4 C)  TempSrc: Oral Oral Oral Oral  SpO2: 96%  96% 95%  Weight: 69.9 kg     Height: 4' 10.8" (1.494 m)     SpO2: 95 % O2 Flow Rate (L/min): 2 L/min FiO2 (%): 28 %  Intake/Output Summary (Last 24 hours) at 03/28/2020 0754 Last data filed at 03/28/2020 0458 Gross per 24 hour  Intake 600 ml  Output --  Net 600 ml   Filed Weights   03/27/20 0957 03/28/20 0025  Weight: 71.2 kg 69.9 kg  Physical Exam Vitals and nursing note reviewed.  Constitutional:      General: She is not in acute distress. Cardiovascular:     Rate and Rhythm: Normal rate and regular rhythm.  Pulmonary:     Effort: Pulmonary effort is normal. No tachypnea or respiratory distress.     Comments: Diffuse velcro-like crackles in bilateral lungs, worse in bases Abdominal:     General: Bowel sounds are normal.     Palpations: Abdomen is soft.     Tenderness: There is no abdominal tenderness.  Musculoskeletal:     Right lower leg: No edema.     Left lower leg: No edema.     Comments: Clubbing of distal phalanges of bilateral hands    CBC Latest Ref Rng & Units 03/28/2020 03/27/2020 08/04/2018  WBC 4.0 - 10.5 K/uL 5.5 8.9 8.3  Hemoglobin 12.0 - 15.0 g/dL 14.0 14.5 15.0  Hematocrit 36 - 46 % 44.3 44.8 43.5  Platelets 150 - 400 K/uL 278 306 306.0   BMP Latest Ref Rng & Units 03/28/2020 03/27/2020 12/09/2018  Glucose 70 - 99 mg/dL 234(H) 138(H) 89  BUN 6 - 20 mg/dL 12 9 -  Creatinine 0.44 - 1.00 mg/dL 0.62 0.56 -  BUN/Creat  Ratio 9 - 23 - - -  Sodium 135 - 145 mmol/L 134(L) 137 -  Potassium 3.5 - 5.1 mmol/L 3.7 3.8 -  Chloride 98 - 111 mmol/L 102 106 -  CO2 22 - 32 mmol/L 19(L) 20(L) -  Calcium 8.9 - 10.3 mg/dL 9.3 9.6 -   Sputum culture - ordered   IMAGING: Echocardiogram - scheduled  Assessment/Plan:  Active Problems:   Acute respiratory distress  Ms. Ennifer Harston is a 54 year old woman with past medical history significant for pulmonary fibrosis on chronic prednisone, GERD, COVID-19 pneumonia 04/2019, now vaccinated against COVID who presented to [facility] on 03/27/20 for evaluation of shortness of breath, cough with blood-tinged sputum and chest tightness who was admitted for further evaluation and management.  #Nonspecific interstitial pneumonia exacerbation, active #Possible associated pneumonia, active Patient reports improvement in her shortness of breath and cough with administration of IV solumedrol in the ED, however her symptoms are still persistent. Patient's exacerbation may simply be the natural progression of her disease versus secondary to decreasing her home prednisone regimen from 10mg  daily to 5mg  every other day. Alternatively, patient may have a superimposed pneumonia, however she has no leukocytosis or fevers.  We will continue steroids, antibiotics, and consult pulmonology for further recommendations. -Consulted pulmonology, appreciate recommendations -Steroid regimen and dosing per pulmonology -Continue azithromycin 250mg  daily and ceftriaxone 1g daily -Continue home gabapentin 100mg  daily -Continue home albuterol 2 puffs q6h PRN -Wean oxygen as tolerated  #Steroid-induced hyperglycemia, active Glucose of 234 on morning BMP in setting of IV solumedrol administration. Patient does not have history of diabetes. -Very sensitive SSI AC + HS -HbA1c  #GERD, chronic -Protonix 40mg  daily  #Diet: Normal #VTE ppx: Lovenox #IVF: None #Code status: Full code #Bowel regimen:  Miralax daily PRN #PT/OT recs: None  Cato Mulligan, MD 03/28/2020, 7:52 AM Pager: [phone] After 5pm on weekdays and 1pm on weekends: On Call pager [phone]

## 2020-03-29 ENCOUNTER — Telehealth: Payer: Self-pay | Admitting: *Deleted

## 2020-03-29 ENCOUNTER — Other Ambulatory Visit: Payer: Self-pay

## 2020-03-29 ENCOUNTER — Other Ambulatory Visit (HOSPITAL_COMMUNITY): Payer: Self-pay | Admitting: Student

## 2020-03-29 LAB — CBC
HCT: 42.3 % (ref 36.0–46.0)
Hemoglobin: 14.2 g/dL (ref 12.0–15.0)
MCH: 30.5 pg (ref 26.0–34.0)
MCHC: 33.6 g/dL (ref 30.0–36.0)
MCV: 91 fL (ref 80.0–100.0)
Platelets: 313 10*3/uL (ref 150–400)
RBC: 4.65 MIL/uL (ref 3.87–5.11)
RDW: 11.8 % (ref 11.5–15.5)
WBC: 9.2 10*3/uL (ref 4.0–10.5)
nRBC: 0 % (ref 0.0–0.2)

## 2020-03-29 LAB — BASIC METABOLIC PANEL
Anion gap: 12 (ref 5–15)
BUN: 17 mg/dL (ref 6–20)
CO2: 23 mmol/L (ref 22–32)
Calcium: 9.4 mg/dL (ref 8.9–10.3)
Chloride: 102 mmol/L (ref 98–111)
Creatinine, Ser: 0.54 mg/dL (ref 0.44–1.00)
GFR, Estimated: >60 mL/min (ref >60)
Glucose, Bld: 108 mg/dL — ABNORMAL HIGH (ref 70–99)
Potassium: 3.5 mmol/L (ref 3.5–5.1)
Sodium: 137 mmol/L (ref 135–145)

## 2020-03-29 LAB — EXPECTORATED SPUTUM ASSESSMENT W GRAM STAIN, RFLX TO RESP C

## 2020-03-29 LAB — RESPIRATORY PANEL BY PCR

## 2020-03-29 LAB — GLUCOSE, CAPILLARY
Glucose-Capillary: 110 mg/dL — ABNORMAL HIGH (ref 70–99)
Glucose-Capillary: 139 mg/dL — ABNORMAL HIGH (ref 70–99)

## 2020-03-29 LAB — FECAL OCCULT BLOOD, IMMUNOCHEMICAL: Fecal Occult Bld: POSITIVE — AB

## 2020-03-29 MED ORDER — GUAIFENESIN-DM 100-10 MG/5ML PO SYRP: 5.0000 mL | ORAL_SOLUTION | ORAL | 0 refills | Status: DC | PRN

## 2020-03-29 MED ORDER — PREDNISONE 20 MG PO TABS: ORAL_TABLET | ORAL | 0 refills | Status: DC

## 2020-03-29 MED FILL — predniSONE 20 MG TABS: 20 | 30 days supply | Qty: 88 | Fill #0

## 2020-03-29 MED FILL — SM TUSSIN DM SYRUP: 100-10 | 5 days supply | Qty: 236 | Fill #0

## 2020-03-29 NOTE — Telephone Encounter (Signed)
[facility] lab rec'd fecal occult blood, IA this am,  POSITIVE  TEST RESULTS in EPIC

## 2020-03-29 NOTE — TOC Initial Note (Addendum)
Transition of Care [facility]) - Initial/Assessment Note    Patient Details  Name: Olivia [name] MRN: [id number] Date of Birth: [birth year]  [facility] of [facility]) CM/SW Contact:    Carles Collet, RN Phone Number: 03/29/2020, 8:35 AM  Clinical Narrative:                 Patient admitted w respiratory distress from home w spouse. Independent PTA, works as Secretary/administrator.  Patient has PCP and pulmonologist, uninsured. Spoke w resident to request meds get sent to [place] today in preporation for DC. [place] entered in procare system for dates 11/24 to 12/1 as [name].   15:20 Spoke w patient and spouse in room. They have $3 for match copays. Spouse will provide transport home. Oxygen through [employer] and LOG. O2 to be delivered to room prior to DC. LOG approved by Olga Coaster. Notified nurse of the above.    Expected Discharge Plan: Home/Self Care Barriers to Discharge: Continued Medical Work up   Patient Goals and CMS Choice        Expected Discharge Plan and Services Expected Discharge Plan: Home/Self Care   Discharge Planning Services: CM Consult, Medication Assistance, MATCH Program                                          Prior Living Arrangements/Services   Lives with:: Spouse                   Activities of Daily Living Home Assistive Devices/Equipment: None ADL Screening (condition at time of admission) Patient's cognitive ability adequate to safely complete daily activities?: Yes Is the patient deaf or have difficulty hearing?: No Does the patient have difficulty seeing, even when wearing glasses/contacts?: No Does the patient have difficulty concentrating, remembering, or making decisions?: No Patient able to express need for assistance with ADLs?: Yes Does the patient have difficulty dressing or bathing?: No Independently performs ADLs?: Yes (appropriate for developmental age) Does the patient have difficulty walking or  climbing stairs?: No Weakness of Legs: None Weakness of Arms/Hands: None  Permission Sought/Granted                  Emotional Assessment              Admission diagnosis:  Acute respiratory distress [R06.03] Interstitial lung disease ([place]) [J84.9] Hypoxia [R09.02] Patient Active Problem List   Diagnosis Date Noted  . Acute respiratory distress 03/27/2020  . History of COVID-19 09/02/2019  . Interstitial pulmonary disease ([place]) 06/28/2019  . Healthcare maintenance 06/28/2019  . Elevated blood pressure reading 12/21/2018  . Dyslipidemia 12/21/2018  . Screening breast examination 11/26/2018  . Exercise hypoxemia 08/05/2018  . Pulmonary infiltrates 08/04/2018  . Chronic cough 03/20/2018  . Right nephrolithiasis 09/07/2017  . History of depression 02/14/2017  . Preventative health care 01/15/2013  . Microscopic hematuria 06/13/2008  . HEMORRHOIDS, NOS 07/03/2006   PCP:  Jeralyn Bennett, MD Pharmacy:   [facility] [place], [place] - [address] [place] [address] Phone: [phone] Fax: [phone]     Social Determinants of Health (SDOH) Interventions    Readmission Risk Interventions No flowsheet data found.

## 2020-03-29 NOTE — Discharge Summary (Addendum)
Name: Olivia [name] MRN: [id number] DOB: 08-04-1965 54 y.o. PCP: Jeralyn Bennett, MD  Date of Admission: 03/27/2020  9:51 AM Date of Discharge:  03/29/2020 Attending Physician: Oda Kilts, MD  Discharge Diagnosis: 1. Active Problems:   Interstitial pulmonary disease ([place])   Acute respiratory distress  Discharge Medications: Allergies as of 03/29/2020   No Known Allergies      Medication List     TAKE these medications    albuterol 108 (90 Base) MCG/ACT inhaler Commonly known as: Ventolin HFA Inhale 2 puffs into the lungs every 6 (six) hours as needed for wheezing or shortness of breath.   gabapentin 100 MG capsule Commonly known as: NEURONTIN TAKE 1 CAPSULE BY MOUTH EVERY MORNING What changed:  how much to take how to take this when to take this additional instructions   guaiFENesin-dextromethorphan 100-10 MG/5ML syrup Commonly known as: ROBITUSSIN DM Take 5 mLs by mouth every 4 (four) hours as needed for cough (chest congestion).   ibuprofen 200 MG tablet Commonly known as: ADVIL Take 400 mg by mouth every 6 (six) hours as needed for moderate pain.   omeprazole 20 MG capsule Commonly known as: PRILOSEC Take 30- 60 min before your first and last meals of the day What changed:  how much to take how to take this when to take this additional instructions   predniSONE 20 MG tablet Commonly known as: DELTASONE Take 2 tablets (40 mg total) by mouth daily for 14 days, THEN 1 tablet (20 mg total) daily. Start taking on: March 30, 2020 What changed:  medication strength See the new instructions.               Durable Medical Equipment  (From admission, onward)           Start     Ordered   03/29/20 1431  DME Oxygen  Once       Question Answer Comment  Length of Need 6 Months   Mode or (Route) Nasal cannula   Liters per Minute 2   Frequency Continuous (stationary and portable oxygen unit needed)   Oxygen conserving  device Yes   Oxygen delivery system Gas      03/29/20 1444            Disposition and follow-up:   [name] was discharged from [facility] in Stable condition.  At the hospital follow up visit please address:  1.  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 Pulmonology consulted and recommended 40mg  prednisone daily for two weeks followed by transition to 20mg  daily until follow-up with pulmonology. On day of discharge, patient continued to have oxygen requirement with ambulation and discharged with 2L oxygen via [place] at home. Please, assess patient's progress with her condition and adherence to prescribed prednisone. Please assess patient's continued need for oxygen at home. Ensure that patient scheduled follow-up with pulmonology.  #FOBT positive: Patient had positive FOBT collected by ED due to her report of hemoptysis. Please assess patient's willingness to obtain colonoscopy and place referral.  2.  Labs / imaging needed at time of follow-up: CBC, BMP  3.  Pending labs/ test needing follow-up: none  Follow-up Appointments:  Follow-up Information     Jeralyn Bennett, MD. Schedule an appointment as soon as possible for a visit in 1 week(s).   Specialty: Internal Medicine Contact information: [place] [address] [phone]  Tanda Rockers, MD. Schedule an appointment as soon as possible for a visit in 1 week(s).   Specialty: Pulmonary Disease Contact information: [place] [address] [phone]                Hospital Course by problem list:  #Nonspecific interstitial pneumonia exacerbation #Acute hypoxemic respiratory failure Patient presented with shortness of breath and cough following tapering of her home prednisone regimen by her pulmonologist. Patient was  placed on 2L oxygen and started on systemic steroids and azithromycin and ceftriaxone. Pulmonlogy consulted and recommended 40mg  prednisone daily for two weeks followed by 20mg  prednisone moving forward until follow-up with pulmonologist in outpatient setting. Further workup of hypoxia and symptoms was unremarkable (echo, RVP, BNP).  She had no infectious symptoms and antibiotics were stopped.  On day of discharge, patient continued to have new oxygen requirement of 2L and was discharged home with supplemental oxygen.  She will need close follow-up with her pulmonologist.  #Positive FOBT Patient had FOBT collected in ED in the setting of her hemoptysis. She denies any bloody bowel movements. Hemodynamically stable, no anemia. Patient would benefit from further workup of this finding in the outpatient setting.  Discharge Vitals:   BP 113/79 (BP Location: Right Arm)   Pulse 78   Temp 98.1 F (36.7 C) (Oral)   Resp 16   Ht 4' 10.8" (1.494 m)   Wt 68.3 kg   LMP  (LMP Unknown)   SpO2 97%   BMI 30.62 kg/m   Pertinent Labs, Studies, and Procedures:  CBC Latest Ref Rng & Units 03/29/2020 03/28/2020 03/27/2020  WBC 4.0 - 10.5 K/uL 9.2 5.5 8.9  Hemoglobin 12.0 - 15.0 g/dL 14.2 14.0 14.5  Hematocrit 36 - 46 % 42.3 44.3 44.8  Platelets 150 - 400 K/uL 313 278 306   CMP Latest Ref Rng & Units 03/29/2020 03/28/2020 03/27/2020  Glucose 70 - 99 mg/dL 108(H) 234(H) 138(H)  BUN 6 - 20 mg/dL 17 12 9   Creatinine 0.44 - 1.00 mg/dL 0.54 0.62 0.56  Sodium 135 - 145 mmol/L 137 134(L) 137  Potassium 3.5 - 5.1 mmol/L 3.5 3.7 3.8  Chloride 98 - 111 mmol/L 102 102 106  CO2 22 - 32 mmol/L 23 19(L) 20(L)  Calcium 8.9 - 10.3 mg/dL 9.4 9.3 9.6  Total Protein 6.5 - 8.1 g/dL - - -  Total Bilirubin 0.3 - 1.2 mg/dL - - -  Alkaline Phos 38 - 126 U/L - - -  AST 15 - 41 U/L - - -  ALT 14 - 54 U/L - - -   DG Chest 2 View  Result Date: 03/27/2020 CLINICAL DATA:  shortness of breath and low O2 sats EXAM: CHEST - 2  VIEW COMPARISON:  09/02/2019 chest radiograph and prior. FINDINGS: Hypoinflated lungs with diffuse interstitial prominence. No pneumothorax or pleural effusion. Cardiomediastinal silhouette is within normal limits. Patchy bibasilar opacities, slightly more conspicuous than prior exam. No acute osseous abnormality. IMPRESSION: Slightly increased conspicuity of bibasilar opacities. Differential includes chronic sequela, atelectasis or infiltrate. Electronically Signed   By: Primitivo Gauze M.D.   On: 03/27/2020 10:28   CT Angio Chest PE W/Cm &/Or Wo Cm  Result Date: 03/27/2020 CLINICAL DATA:  54 year old female with concern for pulmonary embolism. EXAM: CT ANGIOGRAPHY CHEST WITH CONTRAST TECHNIQUE: Multidetector CT imaging of the chest was performed using the standard protocol during bolus administration of intravenous contrast. Multiplanar CT image reconstructions and MIPs were obtained to evaluate the vascular  anatomy. CONTRAST:  79mL OMNIPAQUE IOHEXOL 300 MG/ML  SOLN COMPARISON:  Chest radiograph dated 03/27/2020 and CT dated 07/09/2019 FINDINGS: Evaluation of this exam is limited due to respiratory motion artifact. Cardiovascular: Top-normal cardiac size. No pericardial effusion. The thoracic aorta is unremarkable. Evaluation of the pulmonary arteries is limited due to respiratory motion artifact. No large or central pulmonary artery embolus identified. Mediastinum/Nodes: Mild bilateral hilar adenopathy, likely reactive. There is a small hiatal hernia. The esophagus and the thyroid gland are grossly unremarkable. No mediastinal fluid collection. Lungs/Pleura: There is diffuse interstitial prominence consistent with edema. Bilateral small clusters of ground-glass opacity throughout the lungs may represent edema or pneumonia. Clinical correlation is recommended. There is no pleural effusion pneumothorax. The central airways are patent. Upper Abdomen: Cholecystectomy. Musculoskeletal: No chest wall  abnormality. No acute or significant osseous findings. Review of the MIP images confirms the above findings. IMPRESSION: 1. No CT evidence of central pulmonary artery embolus. 2. Pulmonary edema with bilateral small clusters of ground-glass opacity throughout the lungs may represent edema or pneumonia. Clinical correlation is recommended. 3. Mild bilateral hilar adenopathy, likely reactive. Electronically Signed   By: Anner Crete M.D.   On: 03/27/2020 18:27   ECHOCARDIOGRAM COMPLETE  Result Date: 03/28/2020    ECHOCARDIOGRAM REPORT   Patient Name:   Olivia [name] Date of Exam: 03/28/2020 Medical Rec #:  [id number]              Height:       58.8 in Accession #:    [id number]             Weight:       154.1 lb Date of Birth:  01-13-1966              BSA:          1.647 m Patient Age:    42 years               BP:           104/67 mmHg Patient Gender: F                      HR:           57 bpm. Exam Location:  Inpatient Procedure: Cardiac Doppler, Color Doppler, Intracardiac Opacification Agent and            2D Echo Indications:    Dyspnea  History:        Patient has no prior history of Echocardiogram examinations.                 Signs/Symptoms:Dyspnea and Pul. fibrosis. Covid infection.  Sonographer:    Dustin Flock Referring Phys: [id number] Oda Kilts  Sonographer Comments: Technically difficult study due to poor echo windows. Image acquisition challenging due to respiratory motion. IMPRESSIONS  1. Left ventricular ejection fraction, by estimation, is 60 to 65%. The left ventricle has normal function. The left ventricle has no regional wall motion abnormalities. Left ventricular diastolic parameters are consistent with Grade I diastolic dysfunction (impaired relaxation).  2. Right ventricular systolic function is normal. The right ventricular size is normal. There is normal pulmonary artery systolic pressure. The estimated right ventricular systolic pressure is 75.1 mmHg.  3. The  mitral valve is normal in structure. No evidence of mitral valve regurgitation. No evidence of mitral stenosis.  4. The aortic valve is normal in structure. Aortic valve regurgitation is not visualized. No aortic stenosis is present.  5. The inferior vena cava is normal in size with greater than 50% respiratory variability, suggesting right atrial pressure of 3 mmHg. FINDINGS  Left Ventricle: Left ventricular ejection fraction, by estimation, is 60 to 65%. The left ventricle has normal function. The left ventricle has no regional wall motion abnormalities. Definity contrast agent was given IV to delineate the left ventricular  endocardial borders. The left ventricular internal cavity size was normal in size. There is no left ventricular hypertrophy. Left ventricular diastolic parameters are consistent with Grade I diastolic dysfunction (impaired relaxation). Indeterminate filling pressures. Right Ventricle: The right ventricular size is normal. No increase in right ventricular wall thickness. Right ventricular systolic function is normal. There is normal pulmonary artery systolic pressure. The tricuspid regurgitant velocity is 2.33 m/s, and  with an assumed right atrial pressure of 3 mmHg, the estimated right ventricular systolic pressure is 35.3 mmHg. Left Atrium: Left atrial size was normal in size. Right Atrium: Right atrial size was normal in size. Pericardium: There is no evidence of pericardial effusion. Mitral Valve: The mitral valve is normal in structure. No evidence of mitral valve regurgitation. No evidence of mitral valve stenosis. Tricuspid Valve: The tricuspid valve is normal in structure. Tricuspid valve regurgitation is trivial. No evidence of tricuspid stenosis. Aortic Valve: The aortic valve is normal in structure. Aortic valve regurgitation is not visualized. No aortic stenosis is present. Pulmonic Valve: The pulmonic valve was normal in structure. Pulmonic valve regurgitation is trivial. No  evidence of pulmonic stenosis. Aorta: The aortic root is normal in size and structure. Venous: The inferior vena cava is normal in size with greater than 50% respiratory variability, suggesting right atrial pressure of 3 mmHg. IAS/Shunts: No atrial level shunt detected by color flow Doppler.  LEFT VENTRICLE PLAX 2D LVIDd:         3.60 cm  Diastology LVIDs:         2.50 cm  LV e' medial:    5.55 cm/s LV PW:         0.90 cm  LV E/e' medial:  11.0 LV IVS:        0.80 cm  LV e' lateral:   6.53 cm/s LVOT diam:     2.10 cm  LV E/e' lateral: 9.3 LV SV:         56 LV SV Index:   34 LVOT Area:     3.46 cm  RIGHT VENTRICLE RV Basal diam:  3.00 cm RV S prime:     20.30 cm/s TAPSE (M-mode): 3.1 cm LEFT ATRIUM           Index       RIGHT ATRIUM           Index LA diam:      3.50 cm 2.13 cm/m  RA Area:     13.20 cm LA Vol (A4C): 26.6 ml 16.15 ml/m RA Volume:   33.00 ml  20.04 ml/m  AORTIC VALVE LVOT Vmax:   76.40 cm/s LVOT Vmean:  52.400 cm/s LVOT VTI:    0.161 m  AORTA Ao Root diam: 2.70 cm MITRAL VALVE               TRICUSPID VALVE MV Area (PHT): 3.65 cm    TR Peak grad:   21.7 mmHg MV Decel Time: 208 msec    TR Vmax:        233.00 cm/s MV E velocity: 60.80 cm/s MV A velocity: 87.00 cm/s  SHUNTS MV E/A ratio:  0.70  Systemic VTI:  0.16 m                            Systemic Diam: 2.10 cm Mihai Croitoru MD Electronically signed by Sanda Klein MD Signature Date/Time: 03/28/2020/10:51:19 AM    Final    Discharge Instructions: Discharge Instructions     Call MD for:  difficulty breathing, headache or visual disturbances   Complete by: As directed    Call MD for:  extreme fatigue   Complete by: As directed    Call MD for:  hives   Complete by: As directed    Call MD for:  persistant dizziness or light-headedness   Complete by: As directed    Call MD for:  persistant nausea and vomiting   Complete by: As directed    Call MD for:  redness, tenderness, or signs of infection (pain, swelling, redness, odor or  green/yellow discharge around incision site)   Complete by: As directed    Call MD for:  severe uncontrolled pain   Complete by: As directed    Call MD for:  temperature >100.4   Complete by: As directed    Diet - low sodium heart healthy   Complete by: As directed    Increase activity slowly   Complete by: As directed       Signed: Cato Mulligan, MD 03/29/2020, 2:45 PM   Pager: [phone]

## 2020-03-29 NOTE — Telephone Encounter (Signed)
Thank you for letting us know. Patient is currently admitted to the hospital. Will need work up for positive FOBT once discharged.

## 2020-03-29 NOTE — Discharge Instructions (Signed)
(  Google translation) [name],  Fue un placer atenderlo durante su reciente hospitalizacin. Fue hospitalizado por una exacerbacin de su fibrosis pulmonar. El equipo de neumologa lo vio mientras estaba ingresado en el hospital y recomend aumentar su dosis de prednisona a 40 mg al [employer], seguido de 20 mg [employer] vea a su neumlogo. Ser muy importante que realice un seguimiento con su neumlogo dentro de las prximas semanas para una evaluacin.  Atentamente, Dr. Paulla Dolly, MD  Ms. [facility],  It was a pleasure taking care of you during your recent hospitalization. You were hospitalized for an exacerbation of your pulmonary fibrosis. The pulmonology team saw you while you were admitted to the hospital and recommended increasing your prednisone dose to 40mg  daily for two weeks followed by 20mg  every day until you see your pulmonologist. It will be very important that you follow-up with your pulmonologist within the next few weeks for evaluation.  Sincerely, Dr. Paulla Dolly, MD

## 2020-03-29 NOTE — Progress Notes (Signed)
Olivia [name] to be D/C'd Home per MD order.  Discussed with the patient and all questions fully answered.  VSS, Skin clean, dry and intact without evidence of skin break down, no evidence of skin tears noted. IV catheter discontinued intact. Site without signs and symptoms of complications. Dressing and pressure applied.  An After Visit Summary was printed and given to the patient. Patient received medications.  D/c education completed with patient & family including follow up instructions, medication list, d/c activities limitations if indicated, with other d/c instructions as indicated by MD - patient able to verbalize understanding, all questions fully answered.   Patient instructed to return to ED, call 911, or call MD for any changes in condition.   Patient escorted via [place], and D/C home via private auto.  Olivia [name] 03/29/2020 4:34 PM

## 2020-03-29 NOTE — Progress Notes (Signed)
Subjective:   Overnight, no acute events.  This morning, patient reports persistent coughing with exertion or deep breathing, however her shortness of breath has largely improved. She is requesting when she may be able to go home.  Objective:  Vital signs in last 24 hours: Vitals:   03/28/20 0717 03/28/20 1202 03/28/20 2008 03/29/20 0512  BP: 104/67 109/78 110/69 113/79  Pulse: (!) 57 91 79 78  Resp:   18 16  Temp: 97.6 F (36.4 C) 98.4 F (36.9 C) 97.7 F (36.5 C) 98.1 F (36.7 C)  TempSrc: Oral Oral Oral Oral  SpO2: 95% 92% 93% 97%  Weight:    68.3 kg  Height:      SpO2: 97 % O2 Flow Rate (L/min): 2 L/min FiO2 (%): 28 %  Intake/Output Summary (Last 24 hours) at 03/29/2020 1335 Last data filed at 03/29/2020 0930 Gross per 24 hour  Intake 480 ml  Output --  Net 480 ml   Filed Weights   03/27/20 0957 03/28/20 0025 03/29/20 0512  Weight: 71.2 kg 69.9 kg 68.3 kg  Physical Exam Vitals and nursing note reviewed.  Constitutional:      General: She is not in acute distress. Cardiovascular:     Rate and Rhythm: Normal rate and regular rhythm.  Pulmonary:     Effort: Pulmonary effort is normal. No tachypnea or respiratory distress.     Comments: Crackles in bilateral lower and mid lung fields Abdominal:     General: Bowel sounds are normal.     Palpations: Abdomen is soft.     Tenderness: There is no abdominal tenderness.  Musculoskeletal:     Right lower leg: No edema.     Left lower leg: No edema.     Comments: Clubbing of distal phalanges of bilateral hands    CBC Latest Ref Rng & Units 03/29/2020 03/28/2020 03/27/2020  WBC 4.0 - 10.5 K/uL 9.2 5.5 8.9  Hemoglobin 12.0 - 15.0 g/dL 14.2 14.0 14.5  Hematocrit 36 - 46 % 42.3 44.3 44.8  Platelets 150 - 400 K/uL 313 278 306   BMP Latest Ref Rng & Units 03/29/2020 03/28/2020 03/27/2020  Glucose 70 - 99 mg/dL 108(H) 234(H) 138(H)  BUN 6 - 20 mg/dL 17 12 9   Creatinine 0.44 - 1.00 mg/dL 0.54 0.62 0.56  BUN/Creat  Ratio 9 - 23 - - -  Sodium 135 - 145 mmol/L 137 134(L) 137  Potassium 3.5 - 5.1 mmol/L 3.5 3.7 3.8  Chloride 98 - 111 mmol/L 102 102 106  CO2 22 - 32 mmol/L 23 19(L) 20(L)  Calcium 8.9 - 10.3 mg/dL 9.4 9.3 9.6   Expectorated sputum assessment w rflx to resp cult: Active - In Process RVP: Unremarkable BNP - 59.0 Glucose 139, 110, 153, 103, 83  IMAGING: Echocardiogram: LVEF of 60-65%, G1DD, normal pulmonary artery systolic pressure.  Assessment/Plan:  Active Problems:   Interstitial pulmonary disease ([place])   Acute respiratory distress  [name] is a 54 year old woman with past medical history significant for pulmonary fibrosis on chronic prednisone, GERD, COVID-19 pneumonia 04/2019, now vaccinated against COVID who presented to [facility] on 03/27/20 for evaluation of shortness of breath, cough with blood-tinged sputum and chest tightness who was admitted for further evaluation and management.  #Nonspecific interstitial pneumonia exacerbation, active #Acute hypoxemic respiratory failure, active Pulmonlogy has been consulted and providing recommendations. Patient has received three days of ceftriaxone and azithromycin, and is currently tolerating 40mg  prednisone daily well. Patient reports improvement in her shortness of breath,  however endorses persistent cough with exertion or deep inhalation. RPP negative, no leukocytosis, and no fevers to suggest an infectious etiology of her symptoms.  -Consulted pulmonology, appreciate recommendations  -Prednisone 40mg  daily (>0.5mg /kg) for two weeks, then 20mg  daily until follows up with primary pulmonologist -Discontinue azithromycin and ceftriaxone -Continue home gabapentin 100mg  daily -Continue home albuterol 2 puffs q6h PRN -Robitussin DM Q4H PRN -Wean oxygen as tolerated, RN to check pulse oximetry while ambulating today  #Steroid-induced hyperglycemia, improved, stable Patient's glucose better controlled following transitioning  from IV solumedrol to oral glucose regimen. Patient has not required insulin adminstration. -Discontinue CBG monitoring and SSI -Monitor glucose on BMP  #GERD, chronic -Protonix 40mg  daily  #Positive FOBT Patient had FOBT collected in ED in the setting of her hemoptysis. She denies any bloody bowel movements. Hemodynamically stable, no anemia. Patient would benefit from further workup of this finding in the outpatient setting. -Colonoscopy   #Diet: Normal #VTE ppx: Lovenox #IVF: None #Code status: Full code #Bowel regimen: Miralax daily PRN #PT/OT recs: None  Cato Mulligan, MD 03/29/2020, 1:35 PM Pager: [phone] After 5pm on weekdays and 1pm on weekends: On Call pager [phone]

## 2020-03-29 NOTE — Progress Notes (Signed)
SATURATION QUALIFICATIONS: (This note is used to comply with regulatory documentation for home oxygen)  Patient Saturations on Room Air at Rest = 91%  Patient Saturations on Room Air while Ambulating = 84%  Patient Saturations on 2 Liters of oxygen while Ambulating = 93%  Please briefly explain why patient needs home oxygen:  Patient's oxygen saturation drops to 84% during ambulation on room air.

## 2020-03-29 NOTE — Consult Note (Signed)
Name: Olivia [name] MRN: [id number] DOB: 07-Feb-1966    ADMISSION DATE:  03/27/2020 CONSULTATION DATE: 03/28/2020  REFERRING MD : Teaching service  CHIEF COMPLAINT: Shortness of breath and cough  BRIEF PATIENT DESCRIPTION: Middle-aged Hispanic female no acute distress.   SIGNIFICANT EVENTS    STUDIES:  Ct 11/22 1. No CT evidence of central pulmonary artery embolus. 2. Pulmonary edema with bilateral small clusters of ground-glass opacity throughout the lungs may represent edema or pneumonia. Clinical correlation is recommended. 3. Mild bilateral hilar adenopathy, likely reactive. History of present illness:  54 year old Hispanic female who is followed by Dr. 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  She has never smoked.  She has been in [place] since 2005.  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  She is married her husband continues to smoke but outside.  She was in her usual state of health until approximately 1 week ago when she noticed increasing shortness of breath along with worsening of her chronic cough to the point she was having chest wall pain from the left chest and mid sternum.  She is noted to be increasing work of breathing along with feeling short of breath she noticed her heart racing and was evaluated in [facility] emergency department found to have a heart rate of 130.  Her steroids have been decreased to 5 mg daily and she was given antimicrobial therapy to cover pneumonia along with 1 dose of IV Solu-Medrol.  She had been placed on 2.5 L/min nasal cannula with improvement of all of her symptoms of being tachycardic and short of breath.  She notes she has a chronic cough usually clear she did have 1 incident of blood tinged sputum prior to admission.  She denies fevers, chills, night sweats or any other signs of infection.  She had CT  angio of the chest performed 03/27/2020 with no evidence of PE.  Positive for pulmonary edema bilateral clusters of groundglass opacities throughout the lungs.  We note some bronchiectasis especially in the right middle and upper lobe.  Note she speaks English poorly the investigation was done with the use of the Spanish interpreter via the phone.  At the time of this note she is awake alert no acute distress and reports feeling better. PAST MEDICAL HISTORY :   has a past medical history of GERD (gastroesophageal reflux disease) and Pulmonary fibrosis ([place]).  has a past surgical history that includes Cholecystectomy (05/2009). Prior to Admission medications   Medication Sig Start Date End Date Taking? Authorizing Provider  albuterol (VENTOLIN HFA) 108 (90 Base) MCG/ACT inhaler Inhale 2 puffs into the lungs every 6 (six) hours as needed for wheezing or shortness of breath. 05/17/19  Yes Tanda Rockers, MD  gabapentin (NEURONTIN) 100 MG capsule TAKE 1 CAPSULE BY MOUTH EVERY MORNING Patient taking differently: Take 100 mg by mouth daily.  02/14/20  Yes Tanda Rockers, MD  ibuprofen (ADVIL) 200 MG tablet Take 400 mg by mouth every 6 (six) hours as needed for moderate pain.   Yes [provider]  omeprazole (PRILOSEC) 20 MG capsule Take 30- 60 min before your first and last meals of the day Patient taking differently: Take 20 mg by mouth 2 (two) times daily before a meal.  08/04/18  Yes Tanda Rockers, MD  predniSONE (DELTASONE) 10 MG tablet Take one half daily Patient taking differently: Take 5 mg by  mouth daily with breakfast.  02/14/20  Yes Tanda Rockers, MD   No Known Allergies  FAMILY HISTORY:  family history includes Asthma in her sister; Diabetes in her father and paternal grandfather; Hypertension in her father. SOCIAL HISTORY:  reports that she has never smoked. She has never used smokeless tobacco. She reports previous alcohol use. She reports that she does not use  drugs.  REVIEW OF SYSTEMS:   10 point review of system taken, please see HPI for positives and negatives.   Interval Events:  Awake alert no acute distress. Breathing ok. Oxygen stable, slightly improved.  VITAL SIGNS: Temp:  [97.7 F (36.5 C)-98.4 F (36.9 C)] 98.1 F (36.7 C) (11/24 0512) Pulse Rate:  [78-91] 78 (11/24 0512) Resp:  [16-18] 16 (11/24 0512) BP: (109-113)/(69-79) 113/79 (11/24 0512) SpO2:  [92 %-97 %] 97 % (11/24 0512) Weight:  [68.3 kg] 68.3 kg (11/24 0512)  PHYSICAL EXAMINATION: General: Well-nourished well-developed female currently on 2 L nasal cannula without acute distress Neuro: No focal defects are appreciated HEENT: No JVD or lymphadenopathy is noted Cardiovascular: Heart sounds are regular regular rate rhythm Lungs: Bibasilar fine crackles are appreciated in bases Abdomen: Soft nontender positive bowel sounds Skin: Warm and dry  Recent Labs  Lab 03/27/20 1000 03/28/20 0207 03/29/20 0743  NA 137 134* 137  K 3.8 3.7 3.5  CL 106 102 102  CO2 20* 19* 23  BUN 9 12 17   CREATININE 0.56 0.62 0.54  GLUCOSE 138* 234* 108*   Recent Labs  Lab 03/27/20 1000 03/28/20 0207 03/29/20 0743  HGB 14.5 14.0 14.2  HCT 44.8 44.3 42.3  WBC 8.9 5.5 9.2  PLT 306 278 313   CT Angio Chest PE W/Cm &/Or Wo Cm  Result Date: 03/27/2020 CLINICAL DATA:  54 year old female with concern for pulmonary embolism. EXAM: CT ANGIOGRAPHY CHEST WITH CONTRAST TECHNIQUE: Multidetector CT imaging of the chest was performed using the standard protocol during bolus administration of intravenous contrast. Multiplanar CT image reconstructions and MIPs were obtained to evaluate the vascular anatomy. CONTRAST:  42mL OMNIPAQUE IOHEXOL 300 MG/ML  SOLN COMPARISON:  Chest radiograph dated 03/27/2020 and CT dated 07/09/2019 FINDINGS: Evaluation of this exam is limited due to respiratory motion artifact. Cardiovascular: Top-normal cardiac size. No pericardial effusion. The thoracic aorta is  unremarkable. Evaluation of the pulmonary arteries is limited due to respiratory motion artifact. No large or central pulmonary artery embolus identified. Mediastinum/Nodes: Mild bilateral hilar adenopathy, likely reactive. There is a small hiatal hernia. The esophagus and the thyroid gland are grossly unremarkable. No mediastinal fluid collection. Lungs/Pleura: There is diffuse interstitial prominence consistent with edema. Bilateral small clusters of ground-glass opacity throughout the lungs may represent edema or pneumonia. Clinical correlation is recommended. There is no pleural effusion pneumothorax. The central airways are patent. Upper Abdomen: Cholecystectomy. Musculoskeletal: No chest wall abnormality. No acute or significant osseous findings. Review of the MIP images confirms the above findings. IMPRESSION: 1. No CT evidence of central pulmonary artery embolus. 2. Pulmonary edema with bilateral small clusters of ground-glass opacity throughout the lungs may represent edema or pneumonia. Clinical correlation is recommended. 3. Mild bilateral hilar adenopathy, likely reactive. Electronically Signed   By: Anner Crete M.D.   On: 03/27/2020 18:27   ECHOCARDIOGRAM COMPLETE  Result Date: 03/28/2020    ECHOCARDIOGRAM REPORT   Patient Name:   Olivia [name] Date of Exam: 03/28/2020 Medical Rec #:  [id number]              Height:  58.8 in Accession #:    [id number]             Weight:       154.1 lb Date of Birth:  1966/02/20              BSA:          1.647 m Patient Age:    71 years               BP:           104/67 mmHg Patient Gender: F                      HR:           57 bpm. Exam Location:  Inpatient Procedure: Cardiac Doppler, Color Doppler, Intracardiac Opacification Agent and            2D Echo Indications:    Dyspnea  History:        Patient has no prior history of Echocardiogram examinations.                 Signs/Symptoms:Dyspnea and Pul. fibrosis. Covid infection.  Sonographer:     Dustin Flock Referring Phys: [id number] Oda Kilts  Sonographer Comments: Technically difficult study due to poor echo windows. Image acquisition challenging due to respiratory motion. IMPRESSIONS  1. Left ventricular ejection fraction, by estimation, is 60 to 65%. The left ventricle has normal function. The left ventricle has no regional wall motion abnormalities. Left ventricular diastolic parameters are consistent with Grade I diastolic dysfunction (impaired relaxation).  2. Right ventricular systolic function is normal. The right ventricular size is normal. There is normal pulmonary artery systolic pressure. The estimated right ventricular systolic pressure is 56.3 mmHg.  3. The mitral valve is normal in structure. No evidence of mitral valve regurgitation. No evidence of mitral stenosis.  4. The aortic valve is normal in structure. Aortic valve regurgitation is not visualized. No aortic stenosis is present.  5. The inferior vena cava is normal in size with greater than 50% respiratory variability, suggesting right atrial pressure of 3 mmHg. FINDINGS  Left Ventricle: Left ventricular ejection fraction, by estimation, is 60 to 65%. The left ventricle has normal function. The left ventricle has no regional wall motion abnormalities. Definity contrast agent was given IV to delineate the left ventricular  endocardial borders. The left ventricular internal cavity size was normal in size. There is no left ventricular hypertrophy. Left ventricular diastolic parameters are consistent with Grade I diastolic dysfunction (impaired relaxation). Indeterminate filling pressures. Right Ventricle: The right ventricular size is normal. No increase in right ventricular wall thickness. Right ventricular systolic function is normal. There is normal pulmonary artery systolic pressure. The tricuspid regurgitant velocity is 2.33 m/s, and  with an assumed right atrial pressure of 3 mmHg, the estimated right ventricular  systolic pressure is 87.5 mmHg. Left Atrium: Left atrial size was normal in size. Right Atrium: Right atrial size was normal in size. Pericardium: There is no evidence of pericardial effusion. Mitral Valve: The mitral valve is normal in structure. No evidence of mitral valve regurgitation. No evidence of mitral valve stenosis. Tricuspid Valve: The tricuspid valve is normal in structure. Tricuspid valve regurgitation is trivial. No evidence of tricuspid stenosis. Aortic Valve: The aortic valve is normal in structure. Aortic valve regurgitation is not visualized. No aortic stenosis is present. Pulmonic Valve: The pulmonic valve was normal in structure. Pulmonic valve regurgitation is trivial. No evidence of  pulmonic stenosis. Aorta: The aortic root is normal in size and structure. Venous: The inferior vena cava is normal in size with greater than 50% respiratory variability, suggesting right atrial pressure of 3 mmHg. IAS/Shunts: No atrial level shunt detected by color flow Doppler.  LEFT VENTRICLE PLAX 2D LVIDd:         3.60 cm  Diastology LVIDs:         2.50 cm  LV e' medial:    5.55 cm/s LV PW:         0.90 cm  LV E/e' medial:  11.0 LV IVS:        0.80 cm  LV e' lateral:   6.53 cm/s LVOT diam:     2.10 cm  LV E/e' lateral: 9.3 LV SV:         56 LV SV Index:   34 LVOT Area:     3.46 cm  RIGHT VENTRICLE RV Basal diam:  3.00 cm RV S prime:     20.30 cm/s TAPSE (M-mode): 3.1 cm LEFT ATRIUM           Index       RIGHT ATRIUM           Index LA diam:      3.50 cm 2.13 cm/m  RA Area:     13.20 cm LA Vol (A4C): 26.6 ml 16.15 ml/m RA Volume:   33.00 ml  20.04 ml/m  AORTIC VALVE LVOT Vmax:   76.40 cm/s LVOT Vmean:  52.400 cm/s LVOT VTI:    0.161 m  AORTA Ao Root diam: 2.70 cm MITRAL VALVE               TRICUSPID VALVE MV Area (PHT): 3.65 cm    TR Peak grad:   21.7 mmHg MV Decel Time: 208 msec    TR Vmax:        233.00 cm/s MV E velocity: 60.80 cm/s MV A velocity: 87.00 cm/s  SHUNTS MV E/A ratio:  0.70        Systemic  VTI:  0.16 m                            Systemic Diam: 2.10 cm Mihai Croitoru MD Electronically signed by Sanda Klein MD Signature Date/Time: 03/28/2020/10:51:19 AM    Final     ASSESSMENT AND PLAN: Active Problems:   Acute respiratory distress   Chronic cough refractory to treatment   ILD steroid dependent   Hx of Covid 19 04/2019    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 Low suspicion for infectious etiology.  --Prednisone 40 mg daily (>0.5 mg /kg) x 2 weeks, then 20 mg daily until follows up with primary pulmonologist  Acute hypoxemic respiratory failure: Most likely related to NSIP with exacerbation. Other possible etiologies include pulmonary edema, atypical infection. No orthopnea, PND, and exam not consistent with cardiogenic pulmonary edema. No leukocytosis, cough largely unchanged, no fever, no B symptoms. -BNP WNL -full RPP ordered -TTE largely normal

## 2020-03-31 LAB — CULTURE, RESPIRATORY W GRAM STAIN: Culture: NORMAL

## 2020-04-03 ENCOUNTER — Telehealth: Payer: Self-pay | Admitting: Student

## 2020-04-03 NOTE — Telephone Encounter (Signed)
TOC-HFU APPT [facility] FOR 04/05/2020 @ 2 PM WITH DR Collene Gobble.

## 2020-04-04 ENCOUNTER — Telehealth: Payer: Self-pay | Admitting: *Deleted

## 2020-04-04 NOTE — Telephone Encounter (Signed)
Transition Care Management Follow-up Telephone Call  Date of discharge and from where: Pt was discharged on 11/24 from [facility] hospital to home.  How have you been since you were released from the hospital? Stated she's doing better.  Any questions or concerns?  Stated no concerns or problems.  Items Reviewed:  Did the pt receive and understand the discharge instructions provided? Stated yes.  Medications obtained and verified? Stated no problem with obtaining her medications.    Any new allergies since your discharge? No.  Dietary orders reviewed? Yes stated no special diet.  Do you have support at home? Stated she lives with her family.  Home Care and Equipment/Supplies: Stated no one comes to her home. But she does have oxygen ("small and big" tanks) ; stated which is only for a short period of time.  Functional Questionnaire: (I = Independent and D = Dependent) Stated she does not need any assistance with her ADL's. And she has family who helps if she needs any. Stated she's walking with her oxygen.  Follow up appointments reviewed:   PCP Hospital f/u appt confirmed? Yes, she's aware of her appintment Scheduled to see Dr Collene Gobble on 04/05/20 @ 2PM. Informed translator will be available.   Are transportation arrangements needed? No.  If their condition worsens, is the pt aware to call PCP or go to the Emergency Dept.? Yes.  Was the patient provided with contact information for the PCP's office or ED? Yes.  Was to pt encouraged to call back with questions or concerns? Yes.

## 2020-04-05 ENCOUNTER — Encounter: Payer: Self-pay | Admitting: Student

## 2020-04-05 ENCOUNTER — Ambulatory Visit (INDEPENDENT_AMBULATORY_CARE_PROVIDER_SITE_OTHER): Payer: Self-pay | Admitting: Student

## 2020-04-05 VITALS — BP 129/81 | HR 101 | Temp 98.1°F | Ht <= 58 in | Wt 157.6 lb

## 2020-04-05 DIAGNOSIS — J849 Interstitial pulmonary disease, unspecified: Secondary | ICD-10-CM

## 2020-04-05 DIAGNOSIS — Z0000 Encounter for general adult medical examination without abnormal findings: Secondary | ICD-10-CM

## 2020-04-05 NOTE — Assessment & Plan Note (Signed)
Patient presenting today for hospital follow-up. She was recently discharged for non-specific pulmonary fibrosis exacerbation. She mentions she is feeling better than she did prior to hospitalization. Notes she still has some productive cough and shortness of breath that have both improved. She is still using the supplemental oxygen she was discharged with, 1-2L. [name] says she uses her albuterol as needed, usually does not need it every day. States she has some intermittent sharp chest pain with coughing, unchanged from baseline. Patient has not made a pulmonology appointment yet, but plans to.  Patient tearful in room when discussing the progression of her disease. Encouraged patient that disease is difficult to understand and that she is on the right medications to help control her symptoms. Patient states she has a great support system at home with her husband, Olivia [name].  A/P: Patient stable from discharge, continuing to use minimal supplemental oxygen and albuterol PRN.  - C/w albuterol 1-2 puffs PRN - C/w prednisone 40mg  for the next week, then 20mg  daily - Patient to call and make pulmonology appointment - C/w supplemental O2 as needed

## 2020-04-05 NOTE — Progress Notes (Addendum)
   CC: hospital follow-up  HPI:  [name] is a 54 y.o. with pulmonary fibrosis, GERD presenting to clinic today for follow-up of recent hospitalization for pulmonary fibrosis exacerbation.  Please see problem-based list for further details.  A translator was used during this encounter Costella Hatcher [phone]).  Past Medical History:  Diagnosis Date  . GERD (gastroesophageal reflux disease)   . Pulmonary fibrosis ([place])    Review of Systems:  As per HPI.  Physical Exam:  Vitals:   04/05/20 1404  BP: 129/81  Pulse: (!) 101  Temp: 98.1 F (36.7 C)  TempSrc: Oral  SpO2: 95%  Weight: 157 lb 9.6 oz (71.5 kg)  Height: 4\' 9"  (1.448 m)   General: Sitting in chair, no acute distress Pulm: Rhonchi present bilaterally. No wheezing/rales. CV: Regular rate, rhythm. No m/r/g Neuro: Awake, alert, oriented x4.   Assessment & Plan:   See Encounters Tab for problem based charting.  Patient seen with Dr. Evette Doffing

## 2020-04-05 NOTE — Patient Instructions (Addendum)
Sr./Sra.  Me alegre de verle hoy!  Hoy hablamos de su reciente hospitalizacin. Estoy tan contenta de que se sienta mejor hoy! Contine tomando sus medicamentos segn lo prescrito por los mdicos en el hospital. Ilona Sorrel de llamar a su mdico especialista en pulmones para programar una cita dentro de las [employer].  Te veremos de Scientist, research (physical sciences). Si necesita algo ms antes de esa fecha, llmenos!  Estamos deseando verle la proxima vez. [place] a [phone] si tiene preguntas o preocupaciones. El mejor tiempo para llamar es lunes a viernes, 9:00am - 4:00pm, pero hay alguien esta a su disposcion para lo que halga falta a cualquier hora. Si necesita recambio de los [employer] , llama su farmacia una semana antes de fecha de finalizacion de la receta. La farmacia nos llamara para la solicitud.  Gracias que nos permite tomar parte en su asistencia medica. Deseamos lo mejor!  Clayburn Pert, Dr. Sanjuan Dame, MD

## 2020-04-06 NOTE — Progress Notes (Signed)
Internal Medicine Clinic Attending  I saw and evaluated the patient.  I personally confirmed the key portions of the history and exam documented by Dr. Braswell and I reviewed pertinent patient test results.  The assessment, diagnosis, and plan were formulated together and I agree with the documentation in the resident's note.  

## 2020-04-11 ENCOUNTER — Ambulatory Visit: Payer: Self-pay

## 2020-04-18 ENCOUNTER — Telehealth: Payer: Self-pay | Admitting: Internal Medicine

## 2020-04-18 NOTE — Telephone Encounter (Signed)
Per Dr Melvyn Novas- pt needs f/u and okay to use a blocked spot if needed. LMTCB for the pt.

## 2020-05-02 ENCOUNTER — Telehealth: Payer: Self-pay | Admitting: Student

## 2020-05-02 NOTE — Telephone Encounter (Signed)
TOC HFU appointment 05/12/2020 at 9:45 am.  Unable to confirm appointment with patient.  Appointment letter was mailed.

## 2020-05-02 NOTE — Telephone Encounter (Signed)
-----   Message from Glenford Bayley, MD sent at 04/28/2020 12:03 PM EST ----- Regarding: Hospital Follow UP appointment Needed Hello,  Could you please schedule patient for an [facility] HFU appointment within the next 1-2 weeks?  Thank you,  Dr. Laddie Aquas

## 2020-05-09 NOTE — Telephone Encounter (Signed)
Pt scheduled for 05/25/20. Advised to bring all meds.

## 2020-05-12 ENCOUNTER — Encounter: Payer: No Typology Code available for payment source | Admitting: Student

## 2020-05-25 ENCOUNTER — Encounter: Payer: Self-pay | Admitting: Internal Medicine

## 2020-05-25 ENCOUNTER — Ambulatory Visit (INDEPENDENT_AMBULATORY_CARE_PROVIDER_SITE_OTHER): Payer: Self-pay | Admitting: Internal Medicine

## 2020-05-25 ENCOUNTER — Other Ambulatory Visit: Payer: Self-pay

## 2020-05-25 DIAGNOSIS — R053 Chronic cough: Secondary | ICD-10-CM

## 2020-05-25 DIAGNOSIS — J8489 Other specified interstitial pulmonary diseases: Secondary | ICD-10-CM

## 2020-05-25 NOTE — Progress Notes (Signed)
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 Pulmonary Consult    Referred by Dr Frederico Hamman. Pt c/o SOB since August 2019. She gets SOB walking short distances and also when she makes the bed.   Dyspnea:  Indolent onset doe summer 2019   Now Olivia Olivia = can't walk a nl pace on a flat grade s sob but does fine slow and flat  Cough: to point of vomiting / mucus is clear  Sleep: flat bed and one pillow nonoct cough  SABA use: don't help/ neither does flovent or tessalon prn  No significant arthritis but feels better/ cough less p advil  2 dogs/ no birds  No h/o rheumatologic symptoms, chemo or amiodarone or macrodantin exp. rec GERD diet  Omeprazole 20 mg Take 30- 60 min before your first and last meals of the day  Prednisone 10 mg x 2 daily until better then 1 daily until return  Please schedule a follow up office visit in 4 weeks, sooner if needed  with all medications /inhalers/ solutions in hand Please bring a family member to help interpret at this visit, thank you    09/01/2018  f/u ov/Olivia Olivia re:  PF with gg changes / rx pred 20  Chief Complaint  Patient presents with  . Follow-up    Breathing is some better. She is still taking 20 mg prednisone. She is using her ventolin once daily on average.   Dyspnea:  50-100 ft  Cough: still cough on insp/ no more vomiting Sleeping: able to sleep flat /one  SABA use: helps sob and cough / qvar does not 02: none  rec Plan A = Automatic = dulera 100 Take 2  puffs first thing in am and then another 2 puffs about 12 hours later. Gabapentin 100 mg three times a day  Work on inhaler technique:   Plan B = Backup Only use your albuterol inhaler as a rescue medication   Please remember to go to the lab department   for your tests - we will call you with the results when they are available. Continue omeprazole 20 mg Take 30- 60 min before your first and last meals of the day  GERD diet    09/29/2018  f/u ov/Olivia Olivia re:  PF / asthma improved on prednisone 10 mg qd  Chief Complaint  Patient presents with  . Follow-up    Breathing is doing well today. She has not had to use her rescue inhaler. She has had cough with clear sputum.    Dyspnea:  Walk up 30 min around neighborhood / trouble on hills only  Cough: dry /cough with mopping / deep breathing/ none noct  Sleeping: fine at bedtime flat/one pillo2 SABA use: none  02: none  rec Dulera 100 Take 2 puffs first thing in am and then another 2 puffs about 12 hours later.  Work on inhaler technique:  Increase gabapentin to 300 mg  three times daily  Prednisone 10 mg one on even, one half on odd days  Please schedule a follow up office visit in 4 weeks, sooner if needed     10/27/2018  f/u ov/Olivia Olivia re: pf/ cough variant asthma with uacs  On gabapentin 300 mg hs and still on prednisone 10 mg daily   Chief Complaint  Patient presents with  . Follow-up    Cough has improved some.   Dyspnea:  Walking up to  30 min daily Cough: better esp while on gabapentin / min mucoid Sleeping: fine flat one pillow SABA use: very little rec Work on inhaler technique:   Try prednisone 10 mg one half daily - if worse then start back at 20 mg daily until better, then 10 mg per day for a week then back to 5 mg daily     12/15/2018  f/u ov/Olivia Olivia re: pf/cough variant asthma prednisone 5 mg daily/  Gabapentin 200 in am and 300 at hs // omeprazole 20 mg bid ac  Chief Complaint  Patient presents with  . Follow-up     Breathing is better and less cough in the am. Cough still bothers her when she walks.   Dyspnea:  Walking up to 30 min s stopping  Cough: better but just when starts walking or hot air exp, not noct, occurs always on deep insp Sleeping: no problem lying flat one pillow  SABA use: none 02: not / not monitoring  rec Try again to take gabapentin to 300 mg twice  Daily if you can - if not, resume previous dose No change on prednisone dose for now = 5 mg daily  Ideally need to track 02 levels at peak activity at least once a week to monitor your lung condition Please schedule a follow up visit in 3 months but call sooner if needed    03/23/2019  f/u ov/Olivia Olivia re:  PF/ cough variant asthma vs uacs on pred 5 mg daily and dulera 100 2bid  Chief Complaint  Patient presents with  . Follow-up    Breathing is overall doing better but she does notice some SOB when she wakes up in the morning. She has minimal cough.   Dyspnea:  Not as much with colder weather  Cough: better Sleeping:  No resp problems - one pillow  SABA use:  02:  None rec Keep walking as much as you can  Try taking prednisone 10 mg one half on even days only to see if it makes a difference Reduce your Dulera to one puff twice daily  GERD diet    05/03/19   POS COVID testing   10/11/2019  f/u ov/Olivia Olivia re: prob nsip/ on dulera 100  2bid / omeprazole bid / off prednisone since 1st of 2021 Chief Complaint  Patient presents with  . Follow-up    4-6wk for cough. States she still has a cough, still coughing up phlegm. No one has a headache after coughing. Coughing has decreased. Clear phlegm.   Dyspnea:  Able to do housework same on pred as off / walking 15- 20 min 3 x weekly  Cough: daytime /minimal mucoid production Sleeping: fine one pillow SABA use: none  02: none/ not checking 02 sats  rec Resume gabapentin 100 mg twice daily stop if bothers you and build to 300 mg twice daily to see if helps the cough (the new pill is = 300  mg) . Make sure you check your oxygen saturations at highest level of activity  GERD diet    01/17/2020  f/u ov/Olivia Olivia re: nsip  On dulera  With worse cough overall since covid 04/2019 / pred 5 mg daily  Chief Complaint  Patient presents with  . Follow-up    reports feeling okay but would like to discuss different inhaler due to expense of current on  Dyspnea:  Able to do housework p dulera  Cough: to point of vomiting ever since covid / mucoid  Sleeping: no cough at night at all  SABA use: none at present  02: none - sats only drop when walking uphills  rec Stop dulera for now  Only use your albuterol as a rescue medication  Try albuterol 15 min before an activity that you know would make you short of breath and see if it makes any difference and if makes none then don't take it after activity unless you can't catch your breath. Prednisone 10 mg x 2 daily until cough and breathing are back to normal then reduce back to 1 daily  Add gabapentin 100 mg each am     02/14/2020  f/u ov/Olivia Olivia re: nsip /  pred down to 10 mg one half q 3 days tol off dulera fine Chief Complaint  Patient presents with  . Follow-up    cough with clear sputum in the mornings. Usually when she wakes up.  Dyspnea:  Ok for housework  Cough: better  Sleeping: fine SABA use: rarely despite stopped dulera 02: none  rec Prednisone ceiling is 10 mg  X 2 each am and the floor of one half every 3 days  jolley ranchers or lifesavers as needed instead of mint or menthol products to help with your throat tickle  Please schedule a follow up visit in 3 months but call sooner if needed  - please bring all meds   Date of Admission: 03/27/2020   Date of Discharge:  03/29/2020    Discharge Diagnosis: 1. Active Problems:   Interstitial pulmonary disease (Olivia Olivia)   Acute respiratory distress    Disposition and follow-up:   Ms.Olivia Olivia was discharged from [facility] in Stable condition.   At the hospital follow up visit please address:  1.  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 Pulmonology consulted and recommended 40mg  prednisone daily for two weeks followed by transition to 20mg  daily until follow-up with pulmonology. On day of discharge, patient continued to have oxygen requirement with ambulation and discharged with 2L oxygen via [place] at home. Please, assess patient's progress with her condition and adherence to prescribed prednisone. Please assess patient's continued need for oxygen at home. Ensure that patient scheduled follow-up with pulmonology.  #FOBT positive: Patient had positive FOBT collected by ED due to her report of hemoptysis. Please assess patient's willingness to obtain colonoscopy and place referral.  2.  Labs / imaging needed at time of follow-up: CBC, BMP  3.  Pending labs/ test needing follow-up: none     05/25/2020  Post hosp f/u ov/Olivia Olivia re: nsip/ cough ? Cough variant asthma vs uacs till present on 20 mg pred /day  Chief Complaint  Patient presents with  . Follow-up    Sob-occass.,cough-clear   Dyspnea:  Housework ok,  No longer checking sats  Cough: with activity  Sleeping: bed is flat one pillow  SABA use: minimal 02:  Has it, not using    No obvious day to day or daytime variability or  assoc excess/ purulent sputum or mucus plugs or hemoptysis or cp or chest tightness, subjective wheeze or overt sinus or hb symptoms.   Sleeping  without nocturnal  or early am exacerbation  of respiratory  c/o's or need for noct saba. Also denies any obvious fluctuation of symptoms with weather or environmental changes or other aggravating or alleviating factors except as outlined above   No unusual exposure hx or h/o childhood pna/ asthma or knowledge of premature birth.  Current Allergies, Complete Past Medical History, Past Surgical  History, Family History, and Social History were reviewed in [employer] record.  ROS  The following are not active complaints unless bolded Hoarseness, sore throat, dysphagia, dental problems, itching, sneezing,  nasal congestion or discharge of excess mucus or purulent secretions, ear ache,   fever, chills, sweats, unintended wt loss or wt gain, classically pleuritic or exertional cp,  orthopnea pnd or arm/hand swelling  or leg swelling, presyncope, palpitations, abdominal pain, anorexia, nausea, vomiting, diarrhea  or change in bowel habits or change in bladder habits, change in stools or change in urine, dysuria, hematuria,  rash, arthralgias, visual complaints, headache, numbness, weakness or ataxia or problems with walking or coordination,  change in mood or  memory.        Current Meds  Medication Sig  . albuterol (VENTOLIN HFA) 108 (90 Base) MCG/ACT inhaler Inhale 2 puffs into the lungs every 6 (six) hours as needed for wheezing or shortness of breath.  . gabapentin (NEURONTIN) 100 MG capsule TAKE 1 CAPSULE BY MOUTH EVERY MORNING (Patient taking differently: Take 100 mg by mouth daily.)  . guaiFENesin-dextromethorphan (ROBITUSSIN DM) 100-10 MG/5ML syrup Take 5 mLs by mouth every 4 (four) hours as needed for cough (chest congestion).  Marland Kitchen ibuprofen (ADVIL) 200 MG tablet Take 400 mg by mouth every 6 (six) hours as needed for moderate pain.  Marland Kitchen omeprazole (PRILOSEC) 20 MG capsule Take 30- 60 min before your first and last meals of the day (Patient taking differently: Take 20 mg by mouth 2 (two) times daily before a meal.)  . predniSONE (DELTASONE) 20 MG tablet Take 2 tablets (40 mg total) by mouth daily for 14 days, THEN 1 tablet (20 mg total) daily.        Objective:      05/25/2020       157 02/14/2020     158 01/17/2020       157 10/11/2019         155  03/23/2019     164  12/15/2018       162  10/27/2018       164  09/01/2018       160  08/04/18 158 lb (71.7 kg)   05/29/18 156 lb 6.4 oz (70.9 kg)  03/20/18 159 lb (72.1 kg)    Vital signs reviewed  05/25/2020  - Note at rest 02 sats  96% on RA   General appearance:   Obese hispanic female freq throat clearing, harsh upper airway cough   HEENT : pt wearing mask not removed for exam due to covid -19 concerns.    NECK :  without JVD/Nodes/TM/ nl carotid upstrokes bilaterally   LUNGS: no acc muscle use,  Nl contour chest with distant isnp crackles/ squeaks more in bases than apices  bilaterally     CV:  RRR  no s3 or murmur or increase in P2, and no edema   ABD:  soft and nontender with nl inspiratory excursion in the  supine position. No bruits or organomegaly appreciated, bowel sounds nl  MS:  Nl gait/ ext warm without deformities, calf tenderness, cyanosis  - moderate clubbing No obvious joint restrictions   SKIN: warm and dry without lesions    NEURO:  alert, approp, nl sensorium with  no motor or cerebellar deficits apparent.          I personally reviewed images and agree with radiology impression as follows:   Chest CTa  03/27/20 No PE There is diffuse interstitial prominence consistent with edema. Bilateral small clusters of ground-glass opacity throughout the lungs may represent edema or pneumonia. Clinical correlation is recommended. There is no pleural effusion pneumothorax. The central airways are patent.           Assessment

## 2020-05-25 NOTE — Patient Instructions (Addendum)
As long as 02 level is staying above 90% with the same amount of activity for a week, then reduce the prednisone by half  For example if on 20, reduce to 10.... and if on 10 reduce to 5  And if on 5mg  , reduce to every other day  Resume previous dose if get worse  Omeprazole 20 mg Take 30- 60 min before your first and last meals of the day   Gabapentin increase  100 mg three times a day Late add >>   gradually increase up to 4 x daily if still coughing   Please schedule a follow up office visit in 4 weeks, sooner if needed

## 2020-05-26 ENCOUNTER — Encounter: Payer: Self-pay | Admitting: Internal Medicine

## 2020-05-26 DIAGNOSIS — J8489 Other specified interstitial pulmonary diseases: Secondary | ICD-10-CM | POA: Insufficient documentation

## 2020-05-26 NOTE — Assessment & Plan Note (Signed)
Onset Aug 2019  neg resp to rx with short term pred/ flovent or saba  -  08/04/2018 trial of max rx for gerd plus pred 20 until better then 10 mg daily as occurring in setting of PF w/u in progress  - 09/01/2018   try dulera 100 2bid  - 09/01/2018 added gabapentin 100 mg tid  - 09/29/2018 still throat clearing /cough > rec 300 mg tid trial > could only tolerate 300 mg qhs as of 10/27/2018  - 10/27/2018  After extensive coaching inhaler device,  effectiveness =    25% so needs spacer / recoaching  - 12/15/2018  After extensive coaching inhaler device,  effectiveness =    75% with spacer - 03/23/2019  After extensive coaching inhaler device,  effectiveness =    90% so try dulera 100 one bid   - worse p covid 19 infection 04/2019 but did not req rx  - 10/11/2019 resume gabapentin titrate up to 300 mg bid > unable to use more than 200 mg hs - 01/17/2020 advised rechallenge with 100 mg in am added to 200 mg hs and stop dulera - 02/14/2020 well controlled on just 100 mg q am    - 05/25/2020 advised to incrase back up to 100 mg qid or whatever dose she can tolerate to control the cough plus max gerd rx    F/u in 4 weeks          Each maintenance medication was reviewed in detail including emphasizing most importantly the difference between maintenance and prns and under what circumstances the prns are to be triggered using an action plan format where appropriate.  Total time for H and P, chart review, counseling,   directly observing portions of ambulatory 02 saturation study/  and generating customized AVS unique to this office visit / same day charting = 27 min

## 2020-05-26 NOTE — Assessment & Plan Note (Signed)
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 With these limitations in mind, there is evidence suggestive of interstitial lung disease considered indeterminate for usual interstitial pneumonia (UIP)   - 08/04/2018   Walked RA  2 laps @  approx 260f each @ fast pace  stopped due to  End of study with sob and sats down to 82% - see ex hypoxemia - Collagen Vasc profile 08/04/2018  Nl RA, ANA, ESR=24  - HSP serology 09/01/2018  Neg  - on pred 5 mg /day :  12/15/2018   Walked RA  2 laps @  approx 2563feach @ avg pace  stopped due to  End of study, min sob, sats 93% at end   - 03/23/2019   Walked RA x one lap =  approx 250 ft @ nl pace - stopped due to end of study with sats of 96% at the end of the study and minimal sob on prednisone 5 mg qd so rec qod - Pos Covid testing 04/07/2019  No rx   - Stopped prednisone around 05/07/2019 - 07/09/19 HRCT Very similar findings in the lungs, albeit with slight improvement compared to the prior examination, as detailed above. Although the pattern is technically characterized as indeterminate for usual interstitial pneumonia (UIP), given the slight regression compared to the prior study, UIP is considered unlikely. The primary differential consideration is nonspecific interstitial pneumonia (NSIP).   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  The goal with a chronic steroid dependent illness is always arriving at the  lowest effective dose that controls the disease/symptoms and not accepting a set "formula" which is based on statistics or guidelines that don't always take into account patient  variability or the natural hx of the dz in every individual patient, which may well vary over time.  For now therefore I recommend the patient maintain  A floor of 5 mg qod as long as sats still 90% or greater with activity.

## 2020-06-22 ENCOUNTER — Ambulatory Visit (INDEPENDENT_AMBULATORY_CARE_PROVIDER_SITE_OTHER): Payer: No Typology Code available for payment source | Admitting: Internal Medicine

## 2020-06-22 ENCOUNTER — Other Ambulatory Visit: Payer: Self-pay

## 2020-06-22 ENCOUNTER — Encounter: Payer: Self-pay | Admitting: Internal Medicine

## 2020-06-22 DIAGNOSIS — R053 Chronic cough: Secondary | ICD-10-CM

## 2020-06-22 DIAGNOSIS — J8489 Other specified interstitial pulmonary diseases: Secondary | ICD-10-CM

## 2020-06-22 NOTE — Patient Instructions (Addendum)
As long as 02 level is staying above 90% with the same amount of activity for a week, then reduce the prednisone by half  For example if on 20, reduce to 10.... and if on 10 reduce to 5  And if on 5mg  , reduce to every other day  Resume previous dose if get worse  Omeprazole 20 mg Take 30- 60 min before your first and last meals of the day   Gabapentin increase  100 mg three times a day Late add >>   gradually increase up to 4 x daily if still coughing    I very strongly recommend you get the moderna or pfizer vaccine booster as soon as possible based on your risk of dying from the virus  and the proven safety and benefit of these vaccines against even the delta and omicron variants.  This can save your life as well as  those of your loved ones,  especially if they are also not triple vaccinated.     Please schedule a follow up office visit in 4 weeks, sooner if needed     Instrucciones de Christinia Gully, MD  Instrucciones de Christinia Gully, MD Siempre que el nivel de 02 se mantenga por encima del 90 % con la misma cantidad de actividad durante una semana, reduzca la prednisona a la mitad. Por ejemplo, si tiene 20, reduzca a 10... y si tiene 10, reduzca a 5. , reducir a [place] dosis anterior si empeora  Omeprazol 20 mg Tomar 30- 60 min antes de la primera y ltima comida del da  Aumento de gabapentina 100 mg tres veces al da Aadir tarde >> aumentar gradualmente hasta 4 veces al da si sigue tosiendo    Le recomiendo encarecidamente que obtenga el refuerzo de la vacuna moderna o Estate manager/land agent lo antes posible en funcin de su riesgo de morir a causa del virus y la seguridad y el beneficio comprobados de estas vacunas incluso contra las variantes delta y omicron. Esto puede salvar su vida y la de sus seres queridos, especialmente si ellos tampoco tienen la triple vacuna.    Programe una visita de seguimiento al [employer] en 4 semanas, antes si es necesario

## 2020-06-22 NOTE — Progress Notes (Signed)
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 Pulmonary Consult    Referred by Dr Frederico Hamman. Pt c/o SOB since August 2019. She gets SOB walking short distances and also when she makes the bed.   Dyspnea:  Indolent onset doe summer 2019   Now [facility] = can't walk a nl pace on a flat grade s sob but does fine slow and flat  Cough: to point of vomiting / mucus is clear  Sleep: flat bed and one pillow nonoct cough  SABA use: don't help/ neither does flovent or tessalon prn  No significant arthritis but feels better/ cough less p advil  2 dogs/ no birds  No h/o rheumatologic symptoms, chemo or amiodarone or macrodantin exp. rec GERD diet  Omeprazole 20 mg Take 30- 60 min before your first and last meals of the day  Prednisone 10 mg x 2 daily until better then 1 daily until return  Please schedule a follow up office visit in 4 weeks, sooner if needed  with all medications /inhalers/ solutions in hand Please bring a family member to help interpret at this visit, thank you    09/01/2018  f/u ov/Jahvier Aldea re:  PF with gg changes / rx pred 20  Chief Complaint  Patient presents with  . Follow-up    Breathing is some better. She is still taking 20 mg prednisone. She is using her ventolin once daily on average.   Dyspnea:  50-100 ft  Cough: still cough on insp/ no more vomiting Sleeping: able to sleep flat /one  SABA use: helps sob and cough / qvar does not 02: none  rec Plan A = Automatic = dulera 100 Take 2  puffs first thing in am and then another 2 puffs about 12 hours later. Gabapentin 100 mg three times a day  Work on inhaler technique:   Plan B = Backup Only use your albuterol inhaler as a rescue medication   Please remember to go to the lab department   for your tests - we will call you with the results when they are available. Continue omeprazole 20 mg Take 30- 60 min before your first and last meals of the day  GERD diet    09/29/2018  f/u ov/Matheus Spiker re:  PF / asthma improved on prednisone 10 mg qd  Chief Complaint  Patient presents with  . Follow-up    Breathing is doing well today. She has not had to use her rescue inhaler. She has had cough with clear sputum.    Dyspnea:  Walk up 30 min around neighborhood / trouble on hills only  Cough: dry /cough with mopping / deep breathing/ none noct  Sleeping: fine at bedtime flat/one pillo2 SABA use: none  02: none  rec Dulera 100 Take 2 puffs first thing in am and then another 2 puffs about 12 hours later.  Work on inhaler technique:  Increase gabapentin to 300 mg  three times daily  Prednisone 10 mg one on even, one half on odd days  Please schedule a follow up office visit in 4 weeks, sooner if needed     10/27/2018  f/u ov/Crosby Bevan re: pf/ cough variant asthma with uacs  On gabapentin 300 mg hs and still on prednisone 10 mg daily   Chief Complaint  Patient presents with  . Follow-up    Cough has improved some.   Dyspnea:  Walking up to  30 min daily Cough: better esp while on gabapentin / min mucoid Sleeping: fine flat one pillow SABA use: very little rec Work on inhaler technique:   Try prednisone 10 mg one half daily - if worse then start back at 20 mg daily until better, then 10 mg per day for a week then back to 5 mg daily     12/15/2018  f/u ov/Ricki Vanhandel re: pf/cough variant asthma prednisone 5 mg daily/  Gabapentin 200 in am and 300 at hs // omeprazole 20 mg bid ac  Chief Complaint  Patient presents with  . Follow-up     Breathing is better and less cough in the am. Cough still bothers her when she walks.   Dyspnea:  Walking up to 30 min s stopping  Cough: better but just when starts walking or hot air exp, not noct, occurs always on deep insp Sleeping: no problem lying flat one pillow  SABA use: none 02: not / not monitoring  rec Try again to take gabapentin to 300 mg twice  Daily if you can - if not, resume previous dose No change on prednisone dose for now = 5 mg daily  Ideally need to track 02 levels at peak activity at least once a week to monitor your lung condition Please schedule a follow up visit in 3 months but call sooner if needed    03/23/2019  f/u ov/Jasmaine Rochel re:  PF/ cough variant asthma vs uacs on pred 5 mg daily and dulera 100 2bid  Chief Complaint  Patient presents with  . Follow-up    Breathing is overall doing better but she does notice some SOB when she wakes up in the morning. She has minimal cough.   Dyspnea:  Not as much with colder weather  Cough: better Sleeping:  No resp problems - one pillow  SABA use:  02:  None rec Keep walking as much as you can  Try taking prednisone 10 mg one half on even days only to see if it makes a difference Reduce your Dulera to one puff twice daily  GERD diet    05/03/19   POS COVID testing   10/11/2019  f/u ov/Alyus Mofield re: prob nsip/ on dulera 100  2bid / omeprazole bid / off prednisone since 1st of 2021 Chief Complaint  Patient presents with  . Follow-up    4-6wk for cough. States she still has a cough, still coughing up phlegm. No one has a headache after coughing. Coughing has decreased. Clear phlegm.   Dyspnea:  Able to do housework same on pred as off / walking 15- 20 min 3 x weekly  Cough: daytime /minimal mucoid production Sleeping: fine one pillow SABA use: none  02: none/ not checking 02 sats  rec Resume gabapentin 100 mg twice daily stop if bothers you and build to 300 mg twice daily to see if helps the cough (the new pill is = 300  mg) . Make sure you check your oxygen saturations at highest level of activity  GERD diet    01/17/2020  f/u ov/Khala Tarte re: nsip  On dulera  With worse cough overall since covid 04/2019 / pred 5 mg daily  Chief Complaint  Patient presents with  . Follow-up    reports feeling okay but would like to discuss different inhaler due to expense of current on  Dyspnea:  Able to do housework p dulera  Cough: to point of vomiting ever since covid / mucoid  Sleeping: no cough at night at all  SABA use: none at present  02: none - sats only drop when walking [place]hills  rec Stop dulera for now  Only use your albuterol as a rescue medication  Try albuterol 15 min before an activity that you know would make you short of breath and see if it makes any difference and if makes none then don't take it after activity unless you can't catch your breath. Prednisone 10 mg x 2 daily until cough and breathing are back to normal then reduce back to 1 daily  Add gabapentin 100 mg each am     02/14/2020  f/u ov/Ceil Roderick re: nsip /  pred down to 10 mg one half q 3 days tol off dulera fine Chief Complaint  Patient presents with  . Follow-up    cough with clear sputum in the mornings. Usually when she wakes up.  Dyspnea:  Ok for housework  Cough: better  Sleeping: fine SABA use: rarely despite stopped dulera 02: none  rec Prednisone ceiling is 10 mg  X 2 each am and the floor of one half every 3 days  jolley ranchers or lifesavers as needed instead of mint or menthol products to help with your throat tickle  Please schedule a follow up visit in 3 months but call sooner if needed  - please bring all meds   Date of Admission: 03/27/2020   Date of Discharge:  03/29/2020    Discharge Diagnosis: 1. Active Problems:   Interstitial pulmonary disease ([place])   Acute respiratory distress    Disposition and follow-up:   Ms.Zaia L Sanchez-Rojas was discharged from [facility] in Stable condition.   At the hospital follow up visit please address:  1.  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 Pulmonology consulted and recommended 40mg  prednisone daily for two weeks followed by transition to 20mg  daily until follow-up with pulmonology. On day of discharge, patient continued to have oxygen requirement with ambulation and discharged with 2L oxygen via [place] at home. Please, assess patient's progress with her condition and adherence to prescribed prednisone. Please assess patient's continued need for oxygen at home. Ensure that patient scheduled follow-up with pulmonology.  #FOBT positive: Patient had positive FOBT collected by ED due to her report of hemoptysis. Please assess patient's willingness to obtain colonoscopy and place referral.  2.  Labs / imaging needed at time of follow-up: CBC, BMP  3.  Pending labs/ test needing follow-up: none     05/25/2020  Post hosp f/u ov/Kalista Laguardia re: nsip/ cough ? Cough variant asthma vs uacs till present on 20 mg pred /day  Chief Complaint  Patient presents with  . Follow-up    Sob-occass.,cough-clear   Dyspnea:  Housework ok,  No longer checking sats  Cough: with activity  Sleeping: bed is flat one pillow  SABA use: minimal 02:  Has it, not using  rec As long as 02 level is staying above 90% with  the same amount of activity for a week, then reduce the prednisone by half  For example if on 20, reduce to 10.... and if on 10 reduce to 5  And if on 5mg  , reduce to every other day Resume previous dose if get worse Omeprazole 20 mg Take 30- 60 min before your first and last meals of the day  Gabapentin increase  100 mg three times a day Late add >>   gradually increase up to 4 x daily if still coughing    06/22/2020  f/u ov/Rocio Roam re:  NSIP pred dependent / did not understand instructions  Chief Complaint  Patient presents with  .  Follow-up    Increased SOB and cough- prod with clear sputum x 3 days.   Dyspnea:  Walking at [facility] not checking sats walking but most of the time on RA Cough: dry with deep breath Sleeping: better p gabapentin 100 x 2 but then too groggy so only using 100 mg daily  SABA use: does not help 02: prn but poor  Covid status:   vaccninated not boosted   No obvious day to day or daytime variability or assoc excess/ purulent sputum or mucus plugs or hemoptysis or cp or chest tightness, subjective wheeze or overt sinus or hb symptoms.   Sleeping  without nocturnal  or early am exacerbation  of respiratory  c/o's or need for noct saba. Also denies any obvious fluctuation of symptoms with weather or environmental changes or other aggravating or alleviating factors except as outlined above   No unusual exposure hx or h/o childhood pna/ asthma or knowledge of premature birth.  Current Allergies, Complete Past Medical History, Past Surgical History, Family History, and Social History were reviewed in [employer] record.  ROS  The following are not active complaints unless bolded Hoarseness, sore throat/globus, dysphagia, dental problems, itching, sneezing,  nasal congestion or discharge of excess mucus or purulent secretions, ear ache,   fever, chills, sweats, unintended wt loss or wt gain, classically pleuritic or exertional cp,  orthopnea pnd or arm/hand swelling  or leg swelling, presyncope, palpitations, abdominal pain, anorexia, nausea, vomiting, diarrhea  or change in bowel habits or change in bladder habits, change in stools or change in urine, dysuria, hematuria,  rash, arthralgias, visual complaints, headache, numbness, weakness or ataxia or problems with walking or coordination,  change in mood or  memory.        Current Meds  Medication Sig  . albuterol (VENTOLIN HFA) 108 (90 Base) MCG/ACT inhaler Inhale 2 puffs into the lungs every 6 (six) hours as needed for wheezing or  shortness of breath.  . gabapentin (NEURONTIN) 100 MG capsule TAKE 1 CAPSULE BY MOUTH EVERY MORNING (Patient taking differently: Take 100 mg by mouth daily.)  . omeprazole (PRILOSEC) 20 MG capsule Take 30- 60 min before your first and last meals of the day (Patient taking differently: Take 20 mg by mouth 2 (two) times daily before a meal.)  . predniSONE (DELTASONE) 20 MG tablet Take 20 mg by mouth daily with breakfast.                  Objective:     06/22/2020      160  05/25/2020       157 02/14/2020     158 01/17/2020       157 10/11/2019         155  03/23/2019     164  12/15/2018  162  10/27/2018       164  09/01/2018       160  08/04/18 158 lb (71.7 kg)  05/29/18 156 lb 6.4 oz (70.9 kg)  03/20/18 159 lb (72.1 kg)      Vital signs reviewed  06/22/2020  - Note at rest 02 sats  97% on RA   General appearance:    amb hispanic female nad / cough with deep breath/dry quality     HEENT : pt wearing mask not removed for exam due to covid -19 concerns.    NECK :  without JVD/Nodes/TM/ nl carotid upstrokes bilaterally   LUNGS: no acc muscle use,  Nl contour chest with minimal insp crackles  bilaterally without cough on insp or exp maneuvers   CV:  RRR  no s3 or murmur or increase in P2, and no edema   ABD:  soft and nontender with nl inspiratory excursion in the supine position. No bruits or organomegaly appreciated, bowel sounds nl  MS:  Nl gait/ ext warm without deformities, calf tenderness, cyanosis - mod clubbing No obvious joint restrictions   SKIN: warm and dry without lesions    NEURO:  alert, approp, nl sensorium with  no motor or cerebellar deficits apparent.                   Assessment

## 2020-06-22 NOTE — Assessment & Plan Note (Signed)
Onset Aug 2019  neg resp to rx with short term pred/ flovent or saba  -  08/04/2018 trial of max rx for gerd plus pred 20 until better then 10 mg daily as occurring in setting of PF w/u in progress  - 09/01/2018   try dulera 100 2bid  - 09/01/2018 added gabapentin 100 mg tid  - 09/29/2018 still throat clearing /cough > rec 300 mg tid trial > could only tolerate 300 mg qhs as of 10/27/2018  - 10/27/2018  After extensive coaching inhaler device,  effectiveness =    25% so needs spacer / recoaching  - 12/15/2018  After extensive coaching inhaler device,  effectiveness =    75% with spacer - 03/23/2019  After extensive coaching inhaler device,  effectiveness =    90% so try dulera 100 one bid   - worse p covid 19 infection 04/2019 but did not req rx  - 10/11/2019 resume gabapentin titrate up to 300 mg bid > unable to use more than 200 mg hs - 01/17/2020 advised rechallenge with 100 mg in am added to 200 mg hs and stop dulera - 02/14/2020 well controlled on just 100 mg q am   - 05/25/2020 advised to incrase back up to 100 mg qid or whatever dose she can tolerate to control the cough plus max gerd rx > did not do  By 06/22/2020 > rec try again by starting with 100 mg tid (misunderstood and took 200 mg at a time and "got too sleepy")  Try again with 100 mg up to qid, never 200 mg at at time.          Each maintenance medication was reviewed in detail including emphasizing most importantly the difference between maintenance and prns and under what circumstances the prns are to be triggered using an action plan format where appropriate.  Total time for H and P, chart review, counseling,  and generating customized AVS unique to this office visit which was translated via google and reviewed thru interpreter  / same day charting = 25 min

## 2020-06-22 NOTE — Assessment & Plan Note (Signed)
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 With these limitations in mind, there is evidence suggestive of interstitial lung disease considered indeterminate for usual interstitial pneumonia (UIP)   - 08/04/2018   Walked RA  2 laps @  approx 250ft each @ fast pace  stopped due to  End of study with sob and sats down to 82% - see ex hypoxemia - Collagen Vasc profile 08/04/2018  Nl RA, ANA, ESR=24  - HSP serology 09/01/2018  Neg  - on pred 5 mg /day :  12/15/2018   Walked RA  2 laps @  approx 250ft each @ avg pace  stopped due to  End of study, min sob, sats 93% at end   - 03/23/2019   Walked RA x one lap =  approx 250 ft @ nl pace - stopped due to end of study with sats of 96% at the end of the study and minimal sob on prednisone 5 mg qd so rec qod - Pos Covid testing 04/07/2019  No rx   - Stopped prednisone around 05/07/2019 - 07/09/19 HRCT Very similar findings in the lungs, albeit with slight improvement compared to the prior examination, as detailed above. Although the pattern is technically characterized as indeterminate for usual interstitial pneumonia (UIP), given the slight regression compared to the prior study, UIP is considered unlikely. The primary differential consideration is nonspecific interstitial pneumonia (NSIP).   -  10/11/2019   Walked RA  2 laps @ approx 250ft each @ moderate pace  stopped due to end of study,   no sob - sats 93% at end  - Flare on prednisone 5 mg qod in November 2021 req 40 mg daily with improvement x 14 days so maint on 20 mg daily  -  05/25/2020   Walked RA  2 laps @ approx 250ft each @ fast pace  stopped due to end of study, no sob and sats still 90% at end  So rec slow taper to 5 mg qod if tolerates monitoring ex sats daily   Did not follow instructions re serial sats on RA so repeated them with goal of minimizing esp to systemic steroids as long as sats > 90% with same level of activity she  showed us here.  

## 2020-07-20 ENCOUNTER — Ambulatory Visit (INDEPENDENT_AMBULATORY_CARE_PROVIDER_SITE_OTHER): Payer: No Typology Code available for payment source | Admitting: Internal Medicine

## 2020-07-20 ENCOUNTER — Encounter: Payer: Self-pay | Admitting: Internal Medicine

## 2020-07-20 ENCOUNTER — Other Ambulatory Visit: Payer: Self-pay

## 2020-07-20 DIAGNOSIS — R053 Chronic cough: Secondary | ICD-10-CM

## 2020-07-20 DIAGNOSIS — J8489 Other specified interstitial pulmonary diseases: Secondary | ICD-10-CM

## 2020-07-20 MED ORDER — GABAPENTIN 100 MG PO CAPS
ORAL_CAPSULE | ORAL | 11 refills | Status: DC
Start: 2020-07-20 — End: 2021-08-21

## 2020-07-20 MED ORDER — PREDNISONE 10 MG PO TABS
10.0000 mg | ORAL_TABLET | Freq: Every day | ORAL | 2 refills | Status: DC
Start: 2020-07-20 — End: 2021-07-09

## 2020-07-20 NOTE — Assessment & Plan Note (Signed)
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 With these limitations in mind, there is evidence suggestive of interstitial lung disease considered indeterminate for usual interstitial pneumonia (UIP)   - 08/04/2018   Walked RA  2 laps @  approx 215f each @ fast pace  stopped due to  End of study with sob and sats down to 82% - see ex hypoxemia - Collagen Vasc profile 08/04/2018  Nl RA, ANA, ESR=24  - HSP serology 09/01/2018  Neg  - on pred 5 mg /day :  12/15/2018   Walked RA  2 laps @  approx 2515feach @ avg pace  stopped due to  End of study, min sob, sats 93% at end   - 03/23/2019   Walked RA x one lap =  approx 250 ft @ nl pace - stopped due to end of study with sats of 96% at the end of the study and minimal sob on prednisone 5 mg qd so rec qod - Pos Covid testing 04/07/2019  No rx   - Stopped prednisone around 05/07/2019 - 07/09/19 HRCT Very similar findings in the lungs, albeit with slight improvement compared to the prior examination, as detailed above. Although the pattern is technically characterized as indeterminate for usual interstitial pneumonia (UIP), given the slight regression compared to the prior study, UIP is considered unlikely. The primary differential consideration is nonspecific interstitial pneumonia (NSIP).   -  10/11/2019   Walked RA  2 laps @ approx 2507fach @ moderate pace  stopped due to end of study,   no sob - sats 93% at end  - Flare on prednisone 5 mg qod in November 2021 req 40 mg daily with improvement x 14 days so maint on 20 mg daily  -  05/25/2020   Walked RA  2 laps @ approx 250f46fch @ fast pace  stopped due to end of study, no sob and sats still 90% at end  So rec slow taper to 5 mg qod if tolerates monitoring ex sats daily  -  07/20/2020   Walked RA  2 laps @ approx 250ft60fh @ fast pace  stopped due to end of study sats 89% at end , min sob  @ 10 mg pred/day   The goal with a chronic steroid  dependent illness is always arriving at the lowest effective dose that controls the disease/symptoms and not accepting a set "formula" which is based on statistics or guidelines that don't always take into account patient  variability or the natural hx of the dz in every individual patient, which may well vary over time.  For now therefore I recommend the patient maintain  20 mg ceiling and 10 mg floor   F/u q 3 m

## 2020-07-20 NOTE — Assessment & Plan Note (Signed)
Onset Aug 2019  neg resp to rx with short term pred/ flovent or saba  -  08/04/2018 trial of max rx for gerd plus pred 20 until better then 10 mg daily as occurring in setting of PF w/u in progress  - 09/01/2018   try dulera 100 2bid  - 09/01/2018 added gabapentin 100 mg tid  - 09/29/2018 still throat clearing /cough > rec 300 mg tid trial > could only tolerate 300 mg qhs as of 10/27/2018  - 10/27/2018  After extensive coaching inhaler device,  effectiveness =    25% so needs spacer / recoaching  - 12/15/2018  After extensive coaching inhaler device,  effectiveness =    75% with spacer - 03/23/2019  After extensive coaching inhaler device,  effectiveness =    90% so try dulera 100 one bid   - worse p covid 19 infection 04/2019 but did not req rx  - 10/11/2019 resume gabapentin titrate up to 300 mg bid > unable to use more than 200 mg hs - 01/17/2020 advised rechallenge with 100 mg in am added to 200 mg hs and stop dulera - 02/14/2020 well controlled on just 100 mg q am   - 05/25/2020 advised to incrase back up to 100 mg qid or whatever dose she can tolerate to control the cough plus max gerd rx > did not do  By 06/22/2020 > rec try again by starting with 100 mg tid (misunderstood and took 200 mg at a time and "got too sleepy") - 07/20/2020 on gabapentin 100 mg tid cough ok control s adverse side effects  >>> continue 100 mg tid   Each maintenance medication was reviewed in detail including emphasizing most importantly the difference between maintenance and prns and under what circumstances the prns are to be triggered using an action plan format where appropriate.  Total time for H and P, chart review, counseling,  directly observing portions of ambulatory 02 saturation study/ and generating customized AVS unique to this office visit / same day charting =29 min

## 2020-07-20 NOTE — Patient Instructions (Addendum)
Continue gabapentin 100 mg three times a day  Predisone 10 mg x 2 if worse, then 1 daily   Continue prilosec Take 30- 60 min before your first and last meals of the day   Make sure you check your oxygen saturation  at your highest level of activity  to be sure it stays over 90% and adjust  02 flow upward to maintain this level if needed but remember to turn it back to previous settings when you stop (to conserve your supply).    Please schedule a follow up visit in 3 months but call sooner if needed    Contine con gabapentina 100 mg tres veces al da  Predisona 10 mg x 2 si empeora, luego 1 diaria  Continuar prilosec Tomar 30- 60 min antes de la primera y ltima comida del da  Asegrese de verificar su saturacin de oxgeno en su nivel ms alto de actividad para asegurarse de que se mantenga por encima del 90 % y ajuste el flujo de 02 hacia arriba para Theatre manager este nivel si es necesario, pero recuerde volver a la configuracin anterior cuando se detenga (para conservar su suministro) .   Programe una visita de seguimiento en 3 meses, pero llame antes si es necesario

## 2020-07-20 NOTE — Progress Notes (Signed)
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 Pulmonary Consult    Referred by Dr Frederico Hamman. Pt c/o SOB since August 2019. She gets SOB walking short distances and also when she makes the bed.   Dyspnea:  Indolent onset doe summer 2019   Now [facility] = can't walk a nl pace on a flat grade s sob but does fine slow and flat  Cough: to point of vomiting / mucus is clear  Sleep: flat bed and one pillow nonoct cough  SABA use: don't help/ neither does flovent or tessalon prn  No significant arthritis but feels better/ cough less p advil  2 dogs/ no birds  No h/o rheumatologic symptoms, chemo or amiodarone or macrodantin exp. rec GERD diet  Omeprazole 20 mg Take 30- 60 min before your first and last meals of the day  Prednisone 10 mg x 2 daily until better then 1 daily until return  Please schedule a follow up office visit in 4 weeks, sooner if needed  with all medications /inhalers/ solutions in hand Please bring a family member to help interpret at this visit, thank you    09/01/2018  f/u ov/Olivia [name] re:  PF with gg changes / rx pred 20  Chief Complaint  Patient presents with  . Follow-up    Breathing is some better. She is still taking 20 mg prednisone. She is using her ventolin once daily on average.   Dyspnea:  50-100 ft  Cough: still cough on insp/ no more vomiting Sleeping: able to sleep flat /one  SABA use: helps sob and cough / qvar does not 02: none  rec Plan A = Automatic = dulera 100 Take 2  puffs first thing in am and then another 2 puffs about 12 hours later. Gabapentin 100 mg three times a day  Work on inhaler technique:   Plan B = Backup Only use your albuterol inhaler as a rescue medication   Please remember to go to the lab department   for your tests - we will call you with the results when they are available. Continue omeprazole 20 mg Take 30- 60 min before your first and last meals of the day  GERD diet    09/29/2018  f/u ov/Olivia [name] re:  PF / asthma improved on prednisone 10 mg qd  Chief Complaint  Patient presents with  . Follow-up    Breathing is doing well today. She has not had to use her rescue inhaler. She has had cough with clear sputum.    Dyspnea:  Walk up 30 min around neighborhood / trouble on [place] only  Cough: dry /cough with mopping / deep breathing/ none noct  Sleeping: fine at bedtime flat/one pillo2 SABA use: none  02: none  rec Dulera 100 Take 2 puffs first thing in am and then another 2 puffs about 12 hours later.  Work on inhaler technique:  Increase gabapentin to 300 mg  three times daily  Prednisone 10 mg one on even, one half on odd days  Please schedule a follow up office visit in 4 weeks, sooner if needed     10/27/2018  f/u ov/Olivia [name] re: pf/ cough variant asthma with uacs  On gabapentin 300 mg hs and still on prednisone 10 mg daily   Chief Complaint  Patient presents with  . Follow-up    Cough has improved some.   Dyspnea:  Walking up to  30 min daily Cough: better esp while on gabapentin / min mucoid Sleeping: fine flat one pillow SABA use: very little rec Work on inhaler technique:   Try prednisone 10 mg one half daily - if worse then start back at 20 mg daily until better, then 10 mg per day for a week then back to 5 mg daily     12/15/2018  f/u ov/Olivia [name] re: pf/cough variant asthma prednisone 5 mg daily/  Gabapentin 200 in am and 300 at hs // omeprazole 20 mg bid ac  Chief Complaint  Patient presents with  . Follow-up     Breathing is better and less cough in the am. Cough still bothers her when she walks.   Dyspnea:  Walking up to 30 min s stopping  Cough: better but just when starts walking or hot air exp, not noct, occurs always on deep insp Sleeping: no problem lying flat one pillow  SABA use: none 02: not / not monitoring  rec Try again to take gabapentin to 300 mg twice  Daily if you can - if not, resume previous dose No change on prednisone dose for now = 5 mg daily  Ideally need to track 02 levels at peak activity at least once a week to monitor your lung condition Please schedule a follow up visit in 3 months but call sooner if needed    03/23/2019  f/u ov/Olivia [name] re:  PF/ cough variant asthma vs uacs on pred 5 mg daily and dulera 100 2bid  Chief Complaint  Patient presents with  . Follow-up    Breathing is overall doing better but she does notice some SOB when she wakes up in the morning. She has minimal cough.   Dyspnea:  Not as much with colder weather  Cough: better Sleeping:  No resp problems - one pillow  SABA use:  02:  None rec Keep walking as much as you can  Try taking prednisone 10 mg one half on even days only to see if it makes a difference Reduce your Dulera to one puff twice daily  GERD diet    05/03/19   POS COVID testing   10/11/2019  f/u ov/Olivia [name] re: prob nsip/ on dulera 100  2bid / omeprazole bid / off prednisone since 1st of 2021 Chief Complaint  Patient presents with  . Follow-up    4-6wk for cough. States she still has a cough, still coughing up phlegm. No one has a headache after coughing. Coughing has decreased. Clear phlegm.   Dyspnea:  Able to do housework same on pred as off / walking 15- 20 min 3 x weekly  Cough: daytime /minimal mucoid production Sleeping: fine one pillow SABA use: none  02: none/ not checking 02 sats  rec Resume gabapentin 100 mg twice daily stop if bothers you and build to 300 mg twice daily to see if helps the cough (the new pill is = 300  mg) . Make sure you check your oxygen saturations at highest level of activity  GERD diet    01/17/2020  f/u ov/Olivia [name] re: nsip  On dulera  With worse cough overall since covid 04/2019 / pred 5 mg daily  Chief Complaint  Patient presents with  . Follow-up    reports feeling okay but would like to discuss different inhaler due to expense of current on  Dyspnea:  Able to do housework p dulera  Cough: to point of vomiting ever since covid / mucoid  Sleeping: no cough at night at all  SABA use: none at present  02: none - sats only drop when walking uphills  rec Stop dulera for now  Only use your albuterol as a rescue medication  Try albuterol 15 min before an activity that you know would make you short of breath and see if it makes any difference and if makes none then don't take it after activity unless you can't catch your breath. Prednisone 10 mg x 2 daily until cough and breathing are back to normal then reduce back to 1 daily  Add gabapentin 100 mg each am     02/14/2020  f/u ov/Olivia [name] re: nsip /  pred down to 10 mg one half q 3 days tol off dulera fine Chief Complaint  Patient presents with  . Follow-up    cough with clear sputum in the mornings. Usually when she wakes up.  Dyspnea:  Ok for housework  Cough: better  Sleeping: fine SABA use: rarely despite stopped dulera 02: none  rec Prednisone ceiling is 10 mg  X 2 each am and the floor of one half every 3 days  jolley ranchers or lifesavers as needed instead of mint or menthol products to help with your throat tickle  Please schedule a follow up visit in 3 months but call sooner if needed  - please bring all meds   Date of Admission: 03/27/2020   Date of Discharge:  03/29/2020    Discharge Diagnosis: 1. Active Problems:   Interstitial pulmonary disease ([place])   Acute respiratory distress    Disposition and follow-up:   Ms.Zaia L Sanchez-Rojas was discharged from [facility] in Stable condition.   At the hospital follow up visit please address:  1.  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 Pulmonology consulted and recommended 40mg  prednisone daily for two weeks followed by transition to 20mg  daily until follow-up with pulmonology. On day of discharge, patient continued to have oxygen requirement with ambulation and discharged with 2L oxygen via [place] at home. Please, assess patient's progress with her condition and adherence to prescribed prednisone. Please assess patient's continued need for oxygen at home. Ensure that patient scheduled follow-up with pulmonology.  #FOBT positive: Patient had positive FOBT collected by ED due to her report of hemoptysis. Please assess patient's willingness to obtain colonoscopy and place referral.  2.  Labs / imaging needed at time of follow-up: CBC, BMP  3.  Pending labs/ test needing follow-up: none     05/25/2020  Post hosp f/u ov/Olivia [name] re: nsip/ cough ? Cough variant asthma vs uacs till present on 20 mg pred /day  Chief Complaint  Patient presents with  . Follow-up    Sob-occass.,cough-clear   Dyspnea:  Housework ok,  No longer checking sats  Cough: with activity  Sleeping: bed is flat one pillow  SABA use: minimal 02:  Has it, not using  rec As long as 02 level is staying above 90% with  the same amount of activity for a week, then reduce the prednisone by half  For example if on 20, reduce to 10.... and if on 10 reduce to 5  And if on 5mg  , reduce to every other day Resume previous dose if get worse Omeprazole 20 mg Take 30- 60 min before your first and last meals of the day  Gabapentin increase  100 mg three times a day Late add >>   gradually increase up to 4 x daily if still coughing    06/22/2020  f/u ov/Olivia [name] re:  NSIP pred dependent / did not understand instructions  Chief Complaint  Patient presents with  .  Follow-up    Increased SOB and cough- prod with clear sputum x 3 days.   Dyspnea:  Walking at [facility] not checking sats walking but most of the time on RA Cough: dry with deep breath Sleeping: better p gabapentin 100 x 2 but then too groggy so only using 100 mg daily  SABA use: does not help 02: prn but poor  Covid status:   vaccninated not boosted rec As long as 02 level is staying above 90% with the same amount of activity for a week, then reduce the prednisone by half  For example if on 20, reduce to 10.... and if on 10 reduce to 5  And if on 5mg  , reduce to every other day Resume previous dose if get worse Omeprazole 20 mg Take 30- 60 min before your first and last meals of the day  Gabapentin increase  100 mg three times a day Late add >>   gradually increase up to 4 x daily if still coughing  I very strongly recommend you get the moderna or pfizer vaccine booster as soon as possible based on your risk of dying from the virus  and the proven safety and benefit of these vaccines against even the delta and omicron variants.  This can save your life as well as  those of your loved ones,  especially if they are also not triple vaccinated.  Please schedule a follow up office visit in 4 weeks, sooner if needed     07/20/2020  f/u ov/Olivia [name] re:  NSIP on pred 10 mg daily  Chief Complaint  Patient presents with  . Follow-up    SOB and cough have been worse- she relates to the humidity. She is using her albuterol inhaler 1-2 x per day.   Dyspnea: HT walking about the same maybe 5 aisles s 02  Cough: minimal  Sleeping: bed is flat/ one pillow  SABA use: one to two  02:  Rarely using  Covid status:   June 2021 last dose but 05/03/19   POS COVID testing s   No obvious day to day or daytime variability or assoc excess/ purulent sputum or mucus plugs or hemoptysis or cp or chest tightness, subjective wheeze or overt sinus or hb symptoms.   Sleeping as above  without nocturnal  or early am  exacerbation  of respiratory  c/o's or need for noct saba. Also denies any obvious fluctuation of symptoms with weather or environmental changes or other aggravating or alleviating factors except as outlined above   No unusual exposure hx or h/o childhood pna/ asthma or knowledge of premature birth.  Current Allergies, Complete Past Medical History, Past Surgical History, Family History, and Social History were reviewed in [employer] record.  ROS  The following are not active complaints unless bolded Hoarseness, sore  throat, dysphagia, dental problems, itching, sneezing,  nasal congestion or discharge of excess mucus or purulent secretions, ear ache,   fever, chills, sweats, unintended wt loss or wt gain, classically pleuritic or exertional cp,  orthopnea pnd or arm/hand swelling  or leg swelling, presyncope, palpitations, abdominal pain, anorexia, nausea, vomiting, diarrhea  or change in bowel habits or change in bladder habits, change in stools or change in urine, dysuria, hematuria,  rash, arthralgias, visual complaints, headache, numbness, weakness or ataxia or problems with walking or coordination,  change in mood or  memory.        Current Meds  Medication Sig  . albuterol (VENTOLIN HFA) 108 (90 Base) MCG/ACT inhaler Inhale 2 puffs into the lungs every 6 (six) hours as needed for wheezing or shortness of breath.  . gabapentin (NEURONTIN) 100 MG capsule TAKE 1 CAPSULE BY MOUTH EVERY MORNING (Patient taking differently: Take 100 mg by mouth daily.)  . omeprazole (PRILOSEC) 20 MG capsule Take 30- 60 min before your first and last meals of the day (Patient taking differently: Take 20 mg by mouth 2 (two) times daily before a meal.)  . predniSONE (DELTASONE) 10 MG tablet Take 10 mg by mouth daily with breakfast.                       Objective:     07/20/2020       159   06/22/2020      160  05/25/2020       157 02/14/2020     158 01/17/2020        157 10/11/2019         155  03/23/2019     164  12/15/2018       162  10/27/2018       164  09/01/2018       160  08/04/18 158 lb (71.7 kg)  05/29/18 156 lb 6.4 oz (70.9 kg)  03/20/18 159 lb (72.1 kg)      Vital signs reviewed  07/20/2020  - Note at rest 02 sats  98% on RA   General appearance:   Pleasant amb latina nad    HEENT : pt wearing mask not removed for exam due to covid -19 concerns.    NECK :  without JVD/Nodes/TM/ nl carotid upstrokes bilaterally   LUNGS: no acc muscle use,  Nl contour chest insp crackles bases  bilaterally with cough on insp  maneuvers   CV:  RRR  no s3 or murmur or increase in P2, and no edema   ABD:  soft and nontender with nl inspiratory excursion in the supine position. No bruits or organomegaly appreciated, bowel sounds nl  MS:  Nl gait/ ext warm without deformities, calf tenderness, cyanosis or -  Pos mild/mod  clubbing No obvious joint restrictions   SKIN: warm and dry without lesions    NEURO:  alert, approp, nl sensorium with  no motor or cerebellar deficits apparent.              Assessment

## 2020-09-21 ENCOUNTER — Ambulatory Visit: Payer: No Typology Code available for payment source

## 2020-09-21 ENCOUNTER — Other Ambulatory Visit: Payer: Self-pay

## 2020-09-28 ENCOUNTER — Encounter: Payer: Self-pay | Admitting: *Deleted

## 2020-10-09 ENCOUNTER — Encounter: Payer: Self-pay | Admitting: Internal Medicine

## 2020-10-09 ENCOUNTER — Ambulatory Visit (INDEPENDENT_AMBULATORY_CARE_PROVIDER_SITE_OTHER): Payer: No Typology Code available for payment source | Admitting: Internal Medicine

## 2020-10-09 ENCOUNTER — Other Ambulatory Visit: Payer: Self-pay

## 2020-10-09 VITALS — BP 116/72 | HR 71 | Temp 97.4°F | Ht <= 58 in | Wt 163.0 lb

## 2020-10-09 DIAGNOSIS — R0902 Hypoxemia: Secondary | ICD-10-CM

## 2020-10-09 DIAGNOSIS — J8489 Other specified interstitial pulmonary diseases: Secondary | ICD-10-CM

## 2020-10-09 DIAGNOSIS — J849 Interstitial pulmonary disease, unspecified: Secondary | ICD-10-CM

## 2020-10-09 NOTE — Patient Instructions (Signed)
No change in medications   We will cancel the 02 today and order an ONO on Room Air    Please schedule a follow up visit in 3 months but call sooner if needed

## 2020-10-09 NOTE — Progress Notes (Signed)
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 Pulmonary Consult    Referred by Dr Frederico Hamman. Pt c/o SOB since August 2019. She gets SOB walking short distances and also when she makes the bed.   Dyspnea:  Indolent onset doe summer 2019   Now [facility] = can't walk a nl pace on a flat grade s sob but does fine slow and flat  Cough: to point of vomiting / mucus is clear  Sleep: flat bed and one pillow nonoct cough  SABA use: don't help/ neither does flovent or tessalon prn  No significant arthritis but feels better/ cough less p advil  2 dogs/ no birds  No h/o rheumatologic symptoms, chemo or amiodarone or macrodantin exp. rec GERD diet  Omeprazole 20 mg Take 30- 60 min before your first and last meals of the day  Prednisone 10 mg x 2 daily until better then 1 daily until return  Please schedule a follow up office visit in 4 weeks, sooner if needed  with all medications /inhalers/ solutions in hand Please bring a family member to help interpret at this visit, thank you    09/01/2018  f/u ov/Olivia [name] re:  PF with gg changes / rx pred 20  Chief Complaint  Patient presents with  . Follow-up    Breathing is some better. She is still taking 20 mg prednisone. She is using her ventolin once daily on average.   Dyspnea:  50-100 ft  Cough: still cough on insp/ no more vomiting Sleeping: able to sleep flat /one  SABA use: helps sob and cough / qvar does not 02: none  rec Plan A = Automatic = dulera 100 Take 2  puffs first thing in am and then another 2 puffs about 12 hours later. Gabapentin 100 mg three times a day  Work on inhaler technique:   Plan B = Backup Only use your albuterol inhaler as a rescue medication   Please remember to go to the lab department   for your tests - we will call you with the results when they are available. Continue omeprazole 20 mg Take 30- 60 min before your first and last meals of the day  GERD diet    09/29/2018  f/u ov/Olivia [name] re:  PF / asthma improved on prednisone 10 mg qd  Chief Complaint  Patient presents with  . Follow-up    Breathing is doing well today. She has not had to use her rescue inhaler. She has had cough with clear sputum.    Dyspnea:  Walk up 30 min around neighborhood / trouble on [place] only  Cough: dry /cough with mopping / deep breathing/ none noct  Sleeping: fine at bedtime flat/one pillo2 SABA use: none  02: none  rec Dulera 100 Take 2 puffs first thing in am and then another 2 puffs about 12 hours later.  Work on inhaler technique:  Increase gabapentin to 300 mg  three times daily  Prednisone 10 mg one on even, one half on odd days  Please schedule a follow up office visit in 4 weeks, sooner if needed     10/27/2018  f/u ov/Olivia [name] re: pf/ cough variant asthma with uacs  On gabapentin 300 mg hs and still on prednisone 10 mg daily   Chief Complaint  Patient presents with  . Follow-up    Cough has improved some.   Dyspnea:  Walking up to  30 min daily Cough: better esp while on gabapentin / min mucoid Sleeping: fine flat one pillow SABA use: very little rec Work on inhaler technique:   Try prednisone 10 mg one half daily - if worse then start back at 20 mg daily until better, then 10 mg per day for a week then back to 5 mg daily     12/15/2018  f/u ov/Olivia [name] re: pf/cough variant asthma prednisone 5 mg daily/  Gabapentin 200 in am and 300 at hs // omeprazole 20 mg bid ac  Chief Complaint  Patient presents with  . Follow-up     Breathing is better and less cough in the am. Cough still bothers her when she walks.   Dyspnea:  Walking up to 30 min s stopping  Cough: better but just when starts walking or hot air exp, not noct, occurs always on deep insp Sleeping: no problem lying flat one pillow  SABA use: none 02: not / not monitoring  rec Try again to take gabapentin to 300 mg twice  Daily if you can - if not, resume previous dose No change on prednisone dose for now = 5 mg daily  Ideally need to track 02 levels at peak activity at least once a week to monitor your lung condition Please schedule a follow up visit in 3 months but call sooner if needed    03/23/2019  f/u ov/Olivia [name] re:  PF/ cough variant asthma vs uacs on pred 5 mg daily and dulera 100 2bid  Chief Complaint  Patient presents with  . Follow-up    Breathing is overall doing better but she does notice some SOB when she wakes up in the morning. She has minimal cough.   Dyspnea:  Not as much with colder weather  Cough: better Sleeping:  No resp problems - one pillow  SABA use:  02:  None rec Keep walking as much as you can  Try taking prednisone 10 mg one half on even days only to see if it makes a difference Reduce your Dulera to one puff twice daily  GERD diet    05/03/19   POS COVID testing   10/11/2019  f/u ov/Olivia [name] re: prob nsip/ on dulera 100  2bid / omeprazole bid / off prednisone since 1st of 2021 Chief Complaint  Patient presents with  . Follow-up    4-6wk for cough. States she still has a cough, still coughing up phlegm. No one has a headache after coughing. Coughing has decreased. Clear phlegm.   Dyspnea:  Able to do housework same on pred as off / walking 15- 20 min 3 x weekly  Cough: daytime /minimal mucoid production Sleeping: fine one pillow SABA use: none  02: none/ not checking 02 sats  rec Resume gabapentin 100 mg twice daily stop if bothers you and build to 300 mg twice daily to see if helps the cough (the new pill is = 300  mg) . Make sure you check your oxygen saturations at highest level of activity  GERD diet    01/17/2020  f/u ov/Olivia [name] re: nsip  On dulera  With worse cough overall since covid 04/2019 / pred 5 mg daily  Chief Complaint  Patient presents with  . Follow-up    reports feeling okay but would like to discuss different inhaler due to expense of current on  Dyspnea:  Able to do housework p dulera  Cough: to point of vomiting ever since covid / mucoid  Sleeping: no cough at night at all  SABA use: none at present  02: none - sats only drop when walking [place]hills  rec Stop dulera for now  Only use your albuterol as a rescue medication  Try albuterol 15 min before an activity that you know would make you short of breath and see if it makes any difference and if makes none then don't take it after activity unless you can't catch your breath. Prednisone 10 mg x 2 daily until cough and breathing are back to normal then reduce back to 1 daily  Add gabapentin 100 mg each am     02/14/2020  f/u ov/Olivia [name] re: nsip /  pred down to 10 mg one half q 3 days tol off dulera fine Chief Complaint  Patient presents with  . Follow-up    cough with clear sputum in the mornings. Usually when she wakes up.  Dyspnea:  Ok for housework  Cough: better  Sleeping: fine SABA use: rarely despite stopped dulera 02: none  rec Prednisone ceiling is 10 mg  X 2 each am and the floor of one half every 3 days  jolley ranchers or lifesavers as needed instead of mint or menthol products to help with your throat tickle  Please schedule a follow up visit in 3 months but call sooner if needed  - please bring all meds   Date of Admission: 03/27/2020   Date of Discharge:  03/29/2020    Discharge Diagnosis: 1. Active Problems:   Interstitial pulmonary disease ([place])   Acute respiratory distress    Disposition and follow-up:   [name] was discharged from [facility] in Stable condition.   At the hospital follow up visit please address:  1.  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 Pulmonology consulted and recommended 40mg  prednisone daily for two weeks followed by transition to 20mg  daily until follow-up with pulmonology. On day of discharge, patient continued to have oxygen requirement with ambulation and discharged with 2L oxygen via [place] at home. Please, assess patient's progress with her condition and adherence to prescribed prednisone. Please assess patient's continued need for oxygen at home. Ensure that patient scheduled follow-up with pulmonology.  #FOBT positive: Patient had positive FOBT collected by ED due to her report of hemoptysis. Please assess patient's willingness to obtain colonoscopy and place referral.  2.  Labs / imaging needed at time of follow-up: CBC, BMP  3.  Pending labs/ test needing follow-up: none     05/25/2020  Post hosp f/u ov/Olivia [name] re: nsip/ cough ? Cough variant asthma vs uacs till present on 20 mg pred /day  Chief Complaint  Patient presents with  . Follow-up    Sob-occass.,cough-clear   Dyspnea:  Housework ok,  No longer checking sats  Cough: with activity  Sleeping: bed is flat one pillow  SABA use: minimal 02:  Has it, not using  rec As long as 02 level is staying above 90% with  the same amount of activity for a week, then reduce the prednisone by half  For example if on 20, reduce to 10.... and if on 10 reduce to 5  And if on 5mg  , reduce to every other day Resume previous dose if get worse Omeprazole 20 mg Take 30- 60 min before your first and last meals of the day  Gabapentin increase  100 mg three times a day Late add >>   gradually increase up to 4 x daily if still coughing    06/22/2020  f/u ov/Olivia [name] re:  NSIP pred dependent / did not understand instructions  Chief Complaint  Patient presents with  .  Follow-up    Increased SOB and cough- prod with clear sputum x 3 days.   Dyspnea:  Walking at [facility] not checking sats walking but most of the time on RA Cough: dry with deep breath Sleeping: better p gabapentin 100 x 2 but then too groggy so only using 100 mg daily  SABA use: does not help 02: prn but poor  Covid status:   vaccninated not boosted rec As long as 02 level is staying above 90% with the same amount of activity for a week, then reduce the prednisone by half  For example if on 20, reduce to 10.... and if on 10 reduce to 5  And if on 5mg  , reduce to every other day Resume previous dose if get worse Omeprazole 20 mg Take 30- 60 min before your first and last meals of the day  Gabapentin increase  100 mg three times a day Late add >>   gradually increase up to 4 x daily if still coughing  I very strongly recommend you get the moderna or pfizer vaccine booster as soon as possible based on your risk of dying from the virus  and the proven safety and benefit of these vaccines against even the delta and omicron variants.  This can save your life as well as  those of your loved ones,  especially if they are also not triple vaccinated.  Please schedule a follow up office visit in 4 weeks, sooner if needed     07/20/2020  f/u ov/Olivia [name] re:  NSIP on pred 10 mg daily  Chief Complaint  Patient presents with  . Follow-up    SOB and cough have been worse- she relates to the humidity. She is using her albuterol inhaler 1-2 x per day.   Dyspnea: HT walking about the same maybe 5 aisles s 02  Cough: minimal  Sleeping: bed is flat/ one pillow  SABA use: one to two  02:  Rarely using  Covid status:   June 2021 last dose but 05/03/19   POS COVID testing s symptoms rec Continue gabapentin 100 mg three times a day Predisone 10 mg x 2 if worse, then 1 daily  Continue prilosec Take 30- 60 min before your first and last meals of the day  Make sure you check your oxygen saturation  at your highest level  of activity  to be sure it stays over 90% and adjust  02 flow upward to maintain this level if needed but remember to turn it back to previous settings when you stop (to conserve your supply).      10/09/2020  f/u ov/Olivia [name] re: NSIP on pred 10 mg daily  Chief Complaint  Patient presents with  . Follow-up    Breathing has been doing well. She is using her albuterol inhaler about 3 x  per wk.   Dyspnea:  No change ability to do ht s -2  Cough: dry daytime with insp Sleeping: bed is flat / sleeping 7-8 h per night  SABA use: as above  02: not using at all  Covid status:   Vaccine x 2 and infected   No obvious day to day or daytime variability or assoc excess/ purulent sputum or mucus plugs or hemoptysis or cp or chest tightness, subjective wheeze or overt sinus or hb symptoms.   Sleeping well per pt  without nocturnal  or early am exacerbation  of respiratory  c/o's or need for noct saba. Also denies any obvious fluctuation of symptoms with weather or environmental changes or other aggravating or alleviating factors except as outlined above   No unusual exposure hx or h/o childhood pna/ asthma or knowledge of premature birth.  Current Allergies, Complete Past Medical History, Past Surgical History, Family History, and Social History were reviewed in [employer] record.  ROS  The following are not active complaints unless bolded Hoarseness, sore throat, dysphagia, dental problems, itching, sneezing,  nasal congestion or discharge of excess mucus or purulent secretions, ear ache,   fever, chills, sweats, unintended wt loss or wt gain, classically pleuritic or exertional cp,  orthopnea pnd or arm/hand swelling  or leg swelling, presyncope, palpitations, abdominal pain, anorexia, nausea, vomiting, diarrhea  or change in bowel habits or change in bladder habits, change in stools or change in urine, dysuria, hematuria,  rash, arthralgias, visual complaints, headache, numbness,  weakness or ataxia or problems with walking or coordination,  change in mood or  memory.        Current Meds  Medication Sig  . albuterol (VENTOLIN HFA) 108 (90 Base) MCG/ACT inhaler Inhale 2 puffs into the lungs every 6 (six) hours as needed for wheezing or shortness of breath.  . Dextromethorphan-guaiFENesin 10-100 MG/5ML liquid TAKE 5 MLS BY MOUTH EVERY FOUR HOURS AS NEEDED FOR COUGH CHEST CONGESTION  . gabapentin (NEURONTIN) 100 MG capsule One three times daily  . omeprazole (PRILOSEC) 20 MG capsule Take 30- 60 min before your first and last meals of the day (Patient taking differently: Take 20 mg by mouth 2 (two) times daily before a meal.)  . predniSONE (DELTASONE) 10 MG tablet Take 1 tablet (10 mg total) by mouth daily with breakfast.                          Objective:    10/09/2020          163 07/20/2020       159   06/22/2020      160  05/25/2020       157 02/14/2020     158 01/17/2020       157 10/11/2019         155  03/23/2019     164  12/15/2018       162  10/27/2018       164  09/01/2018       160  08/04/18 158 lb (71.7 kg)  05/29/18 156 lb 6.4 oz (70.9 kg)  03/20/18 159 lb (72.1 kg)    Vital signs reviewed  10/09/2020  - Note at rest 02 sats  97% on RA   General appearance:    amb latina nad       HEENT : pt wearing mask not removed for exam due to covid -19 concerns.    NECK :  without JVD/Nodes/TM/ nl carotid upstrokes bilaterally    LUNGS: no acc muscle use,  Nl contour chest with insp crackles bases  bilaterally without cough on insp or exp maneuvers   CV:  RRR  no s3 or murmur or increase in P2, and no edema   ABD:  soft and nontender with nl inspiratory excursion in the supine position. No bruits or organomegaly appreciated, bowel sounds nl  MS:  Nl gait/ ext warm without deformities, calf tenderness, cyanosis  - moderate clubbing No obvious joint restrictions   SKIN: warm and dry without lesions    NEURO:  alert, approp, nl sensorium with   no motor or cerebellar deficits apparent.                 Assessment

## 2020-10-10 ENCOUNTER — Encounter: Payer: Self-pay | Admitting: Internal Medicine

## 2020-10-10 NOTE — Assessment & Plan Note (Signed)
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 With these limitations in mind, there is evidence suggestive of interstitial lung disease considered indeterminate for usual interstitial pneumonia (UIP)   - 08/04/2018   Walked RA  2 laps @  approx 252f each @ fast pace  stopped due to  End of study with sob and sats down to 82% - see ex hypoxemia - Collagen Vasc profile 08/04/2018  Nl RA, ANA, ESR=24  - HSP serology 09/01/2018  Neg  - on pred 5 mg /day :  12/15/2018   Walked RA  2 laps @  approx 2537feach @ avg pace  stopped due to  End of study, min sob, sats 93% at end   - 03/23/2019   Walked RA x one lap =  approx 250 ft @ nl pace - stopped due to end of study with sats of 96% at the end of the study and minimal sob on prednisone 5 mg qd so rec qod - Pos Covid testing 04/07/2019  No rx   - Stopped prednisone around 05/07/2019 - 07/09/19 HRCT Very similar findings in the lungs, albeit with slight improvement compared to the prior examination, as detailed above. Although the pattern is technically characterized as indeterminate for usual interstitial pneumonia (UIP), given the slight regression compared to the prior study, UIP is considered unlikely. The primary differential consideration is nonspecific interstitial pneumonia (NSIP).   -  10/11/2019   Walked RA  2 laps @ approx 2507fach @ moderate pace  stopped due to end of study,   no sob - sats 93% at end  - Flare on prednisone 5 mg qod in November 2021 req 40 mg daily with improvement x 14 days so maint on 20 mg daily  -  05/25/2020   Walked RA  2 laps @ approx 250f65fch @ fast pace  stopped due to end of study, no sob and sats still 90% at end  So rec slow taper to 5 mg qod if tolerates monitoring ex sats daily  -  07/20/2020   Walked RA  2 laps @ approx 250ft65fh @ fast pace  stopped due to end of study sats 89% at end , min sob  @ 10 mg pred/day   Reports continuing to maintain  sats > 88% with activity on 10 mg per day and requesting d/c 0 2    The goal with a chronic steroid dependent illness is always arriving at the lowest effective dose that controls the disease/symptoms and not accepting a set "formula" which is based on statistics or guidelines that don't always take into account patient  variability or the natural hx of the dz in every individual patient, which may well vary over time.  For now therefore I recommend the patient maintain  10 mg floor for now / f/u a 3 m

## 2020-10-10 NOTE — Assessment & Plan Note (Signed)
08/04/2018 desat at fast pace p 500 ft down to 82  - 09/01/2018   Walked RA  2 laps @  approx 266ft each @ fast pace  stopped due to  End of study, sat 89% at end on pred 20 mg daily   - 09/29/2018   Walked RA  2 laps @  approx 227ft each @ fast  pace  stopped due to  End of study, sats 89% / min sob on prednisone 10 mg daily    - 10/27/2018   Walked RA  2 laps @  approx 229ft each @ nl  pace  stopped due to  End of study, sats 89%, min sob on pred 10 mg daily  - d/c 02 10/09/2020 and ordered ono RA         Each maintenance medication was reviewed in detail including emphasizing most importantly the difference between maintenance and prns and under what circumstances the prns are to be triggered using an action plan format where appropriate.  Total time for H and P, chart review, counseling, reviewing   and generating customized AVS unique to this office visit / same day charting = 25 min

## 2020-10-20 ENCOUNTER — Ambulatory Visit: Payer: No Typology Code available for payment source | Admitting: Internal Medicine

## 2020-11-07 ENCOUNTER — Encounter: Payer: Self-pay | Admitting: *Deleted

## 2020-12-21 ENCOUNTER — Telehealth: Payer: Self-pay | Admitting: Internal Medicine

## 2020-12-21 NOTE — Telephone Encounter (Signed)
I called and spoke with patient regarding ONO test and Dr .Ammie Dalton. I informed patient that she does not need to wear the oxygen while sleeping. Patient verbalized understanding, nothing further needed

## 2021-01-09 ENCOUNTER — Ambulatory Visit: Payer: No Typology Code available for payment source | Admitting: Internal Medicine

## 2021-02-09 ENCOUNTER — Ambulatory Visit (INDEPENDENT_AMBULATORY_CARE_PROVIDER_SITE_OTHER): Payer: Self-pay | Admitting: Internal Medicine

## 2021-02-09 ENCOUNTER — Other Ambulatory Visit: Payer: Self-pay

## 2021-02-09 ENCOUNTER — Encounter: Payer: Self-pay | Admitting: Internal Medicine

## 2021-02-09 DIAGNOSIS — R053 Chronic cough: Secondary | ICD-10-CM

## 2021-02-09 DIAGNOSIS — J8489 Other specified interstitial pulmonary diseases: Secondary | ICD-10-CM

## 2021-02-09 MED ORDER — ALBUTEROL SULFATE HFA 108 (90 BASE) MCG/ACT IN AERS: 2.0000 | INHALATION_SPRAY | Freq: Four times a day (QID) | RESPIRATORY_TRACT | 5 refills | Status: DC | PRN

## 2021-02-09 NOTE — Assessment & Plan Note (Addendum)
Onset Aug 2019  neg resp to rx with short term pred/ flovent or saba  -  08/04/2018 trial of max rx for gerd plus pred 20 until better then 10 mg daily as occurring in setting of PF w/u in progress  - 09/01/2018   try dulera 100 2bid  - 09/01/2018 added gabapentin 100 mg tid  - 09/29/2018 still throat clearing /cough > rec 300 mg tid trial > could only tolerate 300 mg qhs as of 10/27/2018  - 10/27/2018  After extensive coaching inhaler device,  effectiveness =    25% so needs spacer / recoaching  - 12/15/2018  After extensive coaching inhaler device,  effectiveness =    75% with spacer - 03/23/2019  After extensive coaching inhaler device,  effectiveness =    90% so try dulera 100 one bid   - worse p covid 19 infection 04/2019 but did not req rx  - 10/11/2019 resume gabapentin titrate up to 300 mg bid > unable to use more than 200 mg hs - 01/17/2020 advised rechallenge with 100 mg in am added to 200 mg hs and stop dulera - 02/14/2020 well controlled on just 100 mg q am   - 05/25/2020 advised to incrase back up to 100 mg qid or whatever dose she can tolerate to control the cough plus max gerd rx > did not do  By 06/22/2020 > rec try again by starting with 100 mg tid (misunderstood and took 200 mg at a time and "got too sleepy") - 07/20/2020 on gabapentin 100 mg tid cough ok control s adverse side effects  Cannot tol more than 100 mg tid so no change gabapentin rx and continue ppi bid ac/ diet reviewed  She is convinced albuterol helps as well so ok to use prn   - The proper method of use, as well as anticipated side effects, of a metered-dose inhaler were discussed and demonstrated to the patient using teach back method. Improved effectiveness after extensive coaching during this visit to a level of approximately 50 % from a baseline of 0 %           Each maintenance medication was reviewed in detail including emphasizing most importantly the difference between maintenance and prns and under what  circumstances the prns are to be triggered using an action plan format where appropriate.  Total time for H and P, chart review, counseling, reviewing hfa device(s) and generating customized AVS unique to this office visit / same day charting = 26 min

## 2021-02-09 NOTE — Assessment & Plan Note (Signed)
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 With these limitations in mind, there is evidence suggestive of interstitial lung disease considered indeterminate for usual interstitial pneumonia (UIP)   - 08/04/2018   Walked RA  2 laps @  approx 246f each @ fast pace  stopped due to  End of study with sob and sats down to 82% - see ex hypoxemia - Collagen Vasc profile 08/04/2018  Nl RA, ANA, ESR=24  - HSP serology 09/01/2018  Neg  - on pred 5 mg /day :  12/15/2018   Walked RA  2 laps @  approx 2557feach @ avg pace  stopped due to  End of study, min sob, sats 93% at end   - 03/23/2019   Walked RA x one lap =  approx 250 ft @ nl pace - stopped due to end of study with sats of 96% at the end of the study and minimal sob on prednisone 5 mg qd so rec qod - Pos Covid testing 04/07/2019  No rx   - Stopped prednisone around 05/07/2019 - 07/09/19 HRCT Very similar findings in the lungs, albeit with slight improvement compared to the prior examination, as detailed above. Although the pattern is technically characterized as indeterminate for usual interstitial pneumonia (UIP), given the slight regression compared to the prior study, UIP is considered unlikely. The primary differential consideration is nonspecific interstitial pneumonia (NSIP).   -  10/11/2019   Walked RA  2 laps @ approx 25090fach @ moderate pace  stopped due to end of study,   no sob - sats 93% at end  - Flare on prednisone 5 mg qod in November 2021 req 40 mg daily with improvement x 14 days so maint on 20 mg daily  -  05/25/2020   Walked RA  2 laps @ approx 250f39fch @ fast pace  stopped due to end of study, no sob and sats still 90% at end  So rec slow taper to 5 mg qod if tolerates monitoring ex sats daily  -  07/20/2020   Walked RA  2 laps @ approx 250ft30fh @ fast pace  stopped due to end of study sats 89% at end , min sob  @ 10 mg pred/day   The goal with a chronic steroid  dependent illness is always arriving at the lowest effective dose that controls the disease/symptoms and not accepting a set "formula" which is based on statistics or guidelines that don't always take into account patient  variability or the natural hx of the dz in every individual patient, which may well vary over time.  For now therefore I recommend the patient maintain  10 mg floor, 20 mg ceiling to control cough and doe and maintain sats > 88%

## 2021-02-09 NOTE — Patient Instructions (Addendum)
When either the cough or breathing or oxygen saturations less than 89% with exercise > double the prednisone until better then one daily   Work on inhaler technique:  relax and gently blow all the way out then take a nice smooth full deep breath back in, triggering the inhaler at same time you start breathing in.  Hold for up to 5 seconds if you can.  Rinse and gargle with water when done.  If mouth or throat bother you at all,  try brushing teeth/gums/tongue with arm and hammer toothpaste/ make a slurry and gargle and spit out.   Continue omeprazole Take 30- 60 min before your first and last meals of the day   GERD (REFLUX)  is an extremely common cause of respiratory symptoms just like yours , many times with no obvious heartburn at all.    It can be treated with medication, but also with lifestyle changes including elevation of the head of your bed (ideally with 6 -8inch blocks under the headboard of your bed),  Smoking cessation, avoidance of late meals, excessive alcohol, and avoid fatty foods, chocolate, peppermint, colas, red wine, and acidic juices such as orange juice.  NO MINT OR MENTHOL PRODUCTS SO NO COUGH DROPS  USE SUGARLESS CANDY INSTEAD (Jolley ranchers or Stover's or Life Savers) or even ice chips will also do - the key is to swallow to prevent all throat clearing. NO OIL BASED VITAMINS - use powdered substitutes.  Avoid fish oil when coughing.    Please schedule a follow up visit in 6 months but call sooner if needed      Cuando la tos o la respiracin o la saturacin de oxgeno son inferiores al 89 % con el ejercicio > doble la prednisona hasta mejor que una diaria  Trabaje en la tcnica del inhalador: reljese y sople suavemente hasta el final, luego tome una respiracin suave y profunda de nuevo, disparando el inhalador al mismo tiempo que comienza a Advice worker. Sostenga hasta 5 segundos si puede. Enjuague y haga grgaras con agua cuando termine. Si le molestan la boca o la  garganta, intente cepillarse los dientes/las encas/la lengua con pasta de dientes con brazo y martillo/haga una papilla y haga grgaras y escupa.  Continuar omeprazol Tomar 30- 60 min antes de la primera y ltima comida del da  La ERGE (REFLUX) es una causa extremadamente comn de sntomas respiratorios como los suyos, muchas veces sin acidez [place].   Se puede tratar con medicamentos, pero tambin con cambios en el estilo de vida, incluida la elevacin de la cabecera de la cama (idealmente con bloques de 6 a 8 pulgadas debajo de la cabecera de la cama), dejar de fumar, evitar las comidas tardas, el exceso de alcohol y Product/process development scientist las comidas grasosas. , chocolate, menta, refrescos de cola, vino tinto y jugos cidos como el jugo de [place]. NO HAY PRODUCTOS DE MENTA O MENTOL, POR LO QUE NO HAY PASTILLAS PARA LA TOS UTILICE CARAMELOS SIN AZCAR EN SU LUGAR (Jolley ranchers o Stover's o Astronomer) o incluso pedacitos de hielo tambin servirn: la clave es tragar para evitar que se aclare la garganta. SIN VITAMINAS A BASE DE ACEITE: use sustitutos en polvo. Evite el aceite de pescado al toser.   Programe una visita de seguimiento en 6 meses, pero llame antes si es necesario

## 2021-02-09 NOTE — Progress Notes (Signed)
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 Pt c/o SOB since August 2019. She gets SOB walking short distances and also when she makes the bed.   Dyspnea:  Indolent onset doe summer 2019   Now [facility] An = can't walk a nl pace on a flat grade s sob but does fine slow and flat  Cough: to point of vomiting / mucus is clear  Sleep: flat bed and one pillow nonoct cough  SABA use: don't help/ neither does flovent or tessalon prn  No significant arthritis but feels better/ cough less p advil  2 dogs/ no birds  No h/o rheumatologic symptoms, chemo or amiodarone or macrodantin exp. rec GERD diet  Omeprazole 20 mg Take 30- 60 min before your first and last meals of the day  Prednisone 10 mg x 2 daily until better then 1 daily until return  Please schedule a follow up office visit in 4 weeks, sooner if needed  with all medications /inhalers/ solutions in hand Please bring a family member to help interpret at this visit, thank you      10/27/2018  f/u ov/Olivia [name] re: pf/ cough variant asthma with uacs  On gabapentin 300 mg hs and still on prednisone 10 mg daily   Chief Complaint  Patient presents with   Follow-up    Cough has improved some.   Dyspnea:  Walking up to  30 min daily Cough: better esp while on gabapentin / min mucoid Sleeping: fine flat one pillow SABA use: very little rec Work on inhaler technique:   Try prednisone 10 mg one half daily - if worse then  start back at 20 mg daily until better, then 10 mg per day for a week then back to 5 mg daily     05/03/19   POS COVID testing      02/14/2020  f/u ov/Olivia [name] re: nsip /  pred down to 10 mg one half q 3 days tol off dulera fine Chief Complaint  Patient presents with   Follow-up    cough with clear sputum in the mornings. Usually when she wakes up.  Dyspnea:  Ok for housework  Cough: better  Sleeping: fine SABA use: rarely despite stopped dulera 02: none  rec Prednisone ceiling is 10 mg  X 2 each am and the floor of one half every 3 days  jolley ranchers or lifesavers as needed instead of mint or menthol products to help with your throat tickle  Please schedule a follow up visit in 3 months but call sooner if needed  - please bring all meds   Date of Admission: 03/27/2020   Date of Discharge:  03/29/2020    Discharge Diagnosis: 1. Active Problems:   Interstitial pulmonary disease ([place])   Acute respiratory distress      05/25/2020  Post hosp f/u ov/Olivia [name] re: nsip/ cough ? Cough  variant asthma vs uacs till present on 20 mg pred /day  Chief Complaint  Patient presents with   Follow-up    Sob-occass.,cough-clear   Dyspnea:  Housework ok,  No longer checking sats  Cough: with activity  Sleeping: bed is flat one pillow  SABA use: minimal 02:  Has it, not using  rec As long as 02 level is staying above 90% with the same amount of activity for a week, then reduce the prednisone by half  For example if on 20, reduce to 10.... and if on 10 reduce to 5  And if on 5mg  , reduce to every other day Resume previous dose if get worse Omeprazole 20 mg Take 30- 60 min before your first and last meals of the day  Gabapentin increase  100 mg three times a day Late add >>   gradually increase up to 4 x daily if still coughing    06/22/2020  f/u ov/Olivia [name] re:  NSIP pred dependent / did not understand instructions  Chief Complaint  Patient presents with   Follow-up    Increased SOB and cough-  prod with clear sputum x 3 days.   Dyspnea:  Walking at [facility] not checking sats walking but most of the time on RA Cough: dry with deep breath Sleeping: better p gabapentin 100 x 2 but then too groggy so only using 100 mg daily  SABA use: does not help 02: prn but poor  Covid status:   vaccninated not boosted rec As long as 02 level is staying above 90% with the same amount of activity for a week, then reduce the prednisone by half  For example if on 20, reduce to 10.... and if on 10 reduce to 5  And if on 5mg  , reduce to every other day Resume previous dose if get worse Omeprazole 20 mg Take 30- 60 min before your first and last meals of the day  Gabapentin increase  100 mg three times a day Late add >>   gradually increase up to 4 x daily if still coughing      10/09/2020  f/u ov/Olivia [name] re: NSIP on pred 10 mg daily  Chief Complaint  Patient presents with   Follow-up    Breathing has been doing well. She is using her albuterol inhaler about 3 x per wk.   Dyspnea:  No change ability to do ht s -2  Cough: dry daytime with insp Sleeping: bed is flat / sleeping 7-8 h per night  SABA use: as above  02: not using at all  Covid status:   Vaccine x 2 and infected Rec No change in medications  We will cancel the 02 today and order an ONO on Room Air >  12/13/20 desats x 10 min @ < 89% so ok to leave off     02/09/2021  f/u ov/Olivia [name] re: NSIP    maint on prednisone 10 mg daily  worse cough since onset of hot weather better with cooler weather no change prednisone x 6 m Chief Complaint  Patient presents with   Follow-up    Has been controlling cough, around a clear/yellow phlegm  Dyspnea:  walking 2 aisles at HT / lowest 89 %  Cough: dry/ hacking just with temp changes or ex  Sleeping: bed is flat / one pillow no resp problems  SABA use: most  2 daily  02: no  Covid status:   vax x 2 and infected    No obvious day  to day or daytime variability or assoc excess/ purulent sputum or mucus plugs  or hemoptysis or cp or chest tightness, subjective wheeze or overt sinus or hb symptoms.   Sleeping as above  without nocturnal  or early am exacerbation  of respiratory  c/o's or need for noct saba. Also denies any obvious fluctuation of symptoms with weather or environmental changes or other aggravating or alleviating factors except as outlined above   No unusual exposure hx or h/o childhood pna/ asthma or knowledge of premature birth.  Current Allergies, Complete Past Medical History, Past Surgical History, Family History, and Social History were reviewed in [employer] record.  ROS  The following are not active complaints unless bolded Hoarseness, sore throat, dysphagia, dental problems, itching, sneezing,  nasal congestion or discharge of excess mucus or purulent secretions, ear ache,   fever, chills, sweats, unintended wt loss or wt gain, classically pleuritic or exertional cp,  orthopnea pnd or arm/hand swelling  or leg swelling, presyncope, palpitations, abdominal pain, anorexia, nausea, vomiting, diarrhea  or change in bowel habits or change in bladder habits, change in stools or change in urine, dysuria, hematuria,  rash, arthralgias, visual complaints, headache, numbness, weakness or ataxia or problems with walking or coordination,  change in mood or  memory.        Current Meds  Medication Sig   albuterol (VENTOLIN HFA) 108 (90 Base) MCG/ACT inhaler Inhale 2 puffs into the lungs every 6 (six) hours as needed for wheezing or shortness of breath.   Dextromethorphan-guaiFENesin 10-100 MG/5ML liquid TAKE 5 MLS BY MOUTH EVERY FOUR HOURS AS NEEDED FOR COUGH CHEST CONGESTION   gabapentin (NEURONTIN) 100 MG capsule One three times daily   omeprazole (PRILOSEC) 20 MG capsule Take 30- 60 min before your first and last meals of the day (Patient taking differently: Take 20 mg by mouth 2 (two) times daily before a meal.)   predniSONE (DELTASONE) 10 MG tablet Take 1 tablet (10  mg total) by mouth daily with breakfast.         Objective:    02/09/2021       166  10/09/2020         163 07/20/2020       159   06/22/2020      160  05/25/2020       157 02/14/2020     158 01/17/2020       157 10/11/2019         155  03/23/2019     164  12/15/2018       162  10/27/2018       164  09/01/2018       160  08/04/18 158 lb (71.7 kg)  05/29/18 156 lb 6.4 oz (70.9 kg)  03/20/18 159 lb (72.1 kg)     Vital signs reviewed  02/09/2021  - Note at rest 02 sats  93% on RA   General appearance:    amb latina wih harsh hacking cough on deep insp    HEENT : pt wearing mask not removed for exam due to covid -19 concerns.    NECK :  without JVD/Nodes/TM/ nl carotid upstrokes bilaterally   LUNGS: no acc muscle use,  Nl contour chest with insp crackles in bases  bilaterally without cough on insp or exp maneuvers   CV:  RRR  no s3 or murmur or increase in P2, and no edema   ABD:  soft and nontender with nl inspiratory excursion  in the supine position. No bruits or organomegaly appreciated, bowel sounds nl  MS:  Nl gait/ ext warm without deformities, calf tenderness, cyanosis  - mod clubbing No obvious joint restrictions   SKIN: warm and dry without lesions    NEURO:  alert, approp, nl sensorium with  no motor or cerebellar deficits apparent.               Assessment

## 2021-05-30 ENCOUNTER — Ambulatory Visit: Payer: Self-pay | Admitting: Internal Medicine

## 2021-05-30 VITALS — BP 118/81 | HR 73 | Temp 98.1°F | Ht <= 58 in | Wt 163.0 lb

## 2021-05-30 DIAGNOSIS — R918 Other nonspecific abnormal finding of lung field: Secondary | ICD-10-CM

## 2021-05-30 DIAGNOSIS — J849 Interstitial pulmonary disease, unspecified: Secondary | ICD-10-CM

## 2021-05-30 DIAGNOSIS — R109 Unspecified abdominal pain: Secondary | ICD-10-CM

## 2021-05-30 DIAGNOSIS — Z0000 Encounter for general adult medical examination without abnormal findings: Secondary | ICD-10-CM

## 2021-05-30 NOTE — Progress Notes (Signed)
° °  CC: check up  HPI:[name] is a 56 y.o. female who presents for evaluation of check up. Please see individual problem based A/P for details.   Depression, PHQ-9: Based on the patients  [place] Visit from 02/22/2020 in [facility]  PHQ-9 Total Score 0      score we have 0.  Past Medical History:  Diagnosis Date   GERD (gastroesophageal reflux disease)    Pulmonary fibrosis ([place])    Review of Systems:   Review of Systems  Constitutional: Negative.   HENT: Negative.    Eyes: Negative.   Respiratory: Negative.    Cardiovascular: Negative.   Gastrointestinal: Negative.   Genitourinary: Negative.   Musculoskeletal: Negative.   Skin: Negative.   Neurological: Negative.   Endo/Heme/Allergies: Negative.   Psychiatric/Behavioral: Negative.      Physical Exam: There were no vitals filed for this visit.   General: alert and oriented, no acute distress HEENT: Conjunctiva nl , antiicteric sclerae, moist mucous membranes, no exudate or erythema Cardiovascular: Normal rate, regular rhythm.  No murmurs, rubs, or gallops Pulmonary : Equal breath sounds, No wheezes. Diffuse crackles.   Abdominal: soft, nontender,  bowel sounds present Ext: No edema in lower extremities, no tenderness to palpation of lower extremities.   Assessment & Plan:   See Encounters Tab for problem based charting.  Patient discussed with Dr. Jimmye Norman

## 2021-05-30 NOTE — Patient Instructions (Signed)
Dear Olivia [name],  Today we discussed your lung disease. Please continue to see your lung doctor, Dr. Melvyn Novas. I will contact him about getting you home oxygen.  We have also provided you with resources for a mammogram.  Please follow up in the clinic in 6 months for your chronic medical problems.

## 2021-05-31 LAB — BMP8+ANION GAP
Anion Gap: 16 mmol/L (ref 10.0–18.0)
BUN/Creatinine Ratio: 20 (ref 9–23)
BUN: 12 mg/dL (ref 6–24)
CO2: 20 mmol/L (ref 20–29)
Calcium: 9.6 mg/dL (ref 8.7–10.2)
Chloride: 102 mmol/L (ref 96–106)
Creatinine, Ser: 0.61 mg/dL (ref 0.57–1.00)
Glucose: 83 mg/dL (ref 70–99)
Potassium: 4.2 mmol/L (ref 3.5–5.2)
Sodium: 138 mmol/L (ref 134–144)
eGFR: 106 mL/min/{1.73_m2} (ref >59)

## 2021-06-06 ENCOUNTER — Encounter: Payer: Self-pay | Admitting: Internal Medicine

## 2021-06-06 ENCOUNTER — Other Ambulatory Visit: Payer: Self-pay

## 2021-06-06 DIAGNOSIS — R109 Unspecified abdominal pain: Secondary | ICD-10-CM | POA: Insufficient documentation

## 2021-06-06 DIAGNOSIS — Z1231 Encounter for screening mammogram for malignant neoplasm of breast: Secondary | ICD-10-CM

## 2021-06-06 NOTE — Assessment & Plan Note (Signed)
Patient reports abdominal pain radiating to flanks and back. No N/V/D during that time. No changes in her stool. No change in urine. She took a supplement Curcumin which improved pain greatly. Pain has since resolved. Stable, no concerning findings on history. Will continue to monitor. No further workup at this time.

## 2021-06-06 NOTE — Assessment & Plan Note (Signed)
Patient provided with resources for free mammogram through mobile mammogram.

## 2021-06-06 NOTE — Assessment & Plan Note (Signed)
Patient follows with Dr. Melvyn Novas pulmonology for her lung disease.She is on 10mg  prednisone daily and usually takes it once daily, but has been instructed she can take two as necessary. Additionally, she has albuterol PRN that she typically uses once daily.  During evaluation in the office, O2 sat with ambulation dropped to 84%. Patient needing home O2. She is otherwise stable and has an appointment with Dr. Melvyn Novas in April.

## 2021-06-07 NOTE — Progress Notes (Signed)
Internal Medicine Clinic Attending  Case discussed with Dr. Elliot Gurney  At the time of the visit.  We reviewed the residents history and exam and pertinent patient test results.  I agree with the assessment, diagnosis, and plan of care documented in the residents note. Note chronic prednisone increases risk of osteoporosis.  Recommend vit D level and DXA scan when financially feasible.

## 2021-07-06 ENCOUNTER — Telehealth: Payer: Self-pay | Admitting: Student

## 2021-07-06 NOTE — Telephone Encounter (Signed)
Patient called stating that she developed sharp, right upper quadrant abdominal pain that radiated to her back about 2 days ago.  She states that the pain has been persistent.  She denies any trauma or association with food. She had one episode of vomiting but no diarrhea or constipation. She has been able to eat and drink during this time. She denies dysuria or hematuria.  ?She states that she has tried over-the-counter pain medicine to minimal effect.  Of note, she states that she has had kidney stones in the past that have typically passed within a day or two resulting in resolution of her pain.  ? ?A/P: Likely the patient's current symptoms are result of renal calculus.  Discussed with patient that she should get over-the-counter ibuprofen and take 400 mg every 6 hours for 8 doses and see if this helps her pain. Discussed that she should take this with food. She states she has never been told she has PUD or GIB. She also has a sCr WNL on last BMET 05/2021. Patient was told to come to the ED if she developed worsening pain that was not responsive to the ibuprofen, developed a fever, or could not eat or drink. She endorsed understanding.  ?

## 2021-07-07 ENCOUNTER — Other Ambulatory Visit: Payer: Self-pay

## 2021-07-07 ENCOUNTER — Encounter (HOSPITAL_COMMUNITY): Payer: Self-pay | Admitting: Emergency Medicine

## 2021-07-07 ENCOUNTER — Emergency Department (HOSPITAL_COMMUNITY): Payer: No Typology Code available for payment source

## 2021-07-07 ENCOUNTER — Observation Stay (HOSPITAL_COMMUNITY)
Admission: EM | Admit: 2021-07-07 | Discharge: 2021-07-09 | Disposition: A | Discharge status: Home / Self Care | Payer: Self-pay | Attending: Internal Medicine | Admitting: Internal Medicine

## 2021-07-07 DIAGNOSIS — R053 Chronic cough: Secondary | ICD-10-CM | POA: Diagnosis present

## 2021-07-07 DIAGNOSIS — J8410 Pulmonary fibrosis, unspecified: Secondary | ICD-10-CM

## 2021-07-07 DIAGNOSIS — R0902 Hypoxemia: Secondary | ICD-10-CM

## 2021-07-07 DIAGNOSIS — J84114 Acute interstitial pneumonitis: Principal | ICD-10-CM | POA: Insufficient documentation

## 2021-07-07 DIAGNOSIS — J9621 Acute and chronic respiratory failure with hypoxia: Secondary | ICD-10-CM

## 2021-07-07 DIAGNOSIS — Z8659 Personal history of other mental and behavioral disorders: Secondary | ICD-10-CM | POA: Insufficient documentation

## 2021-07-07 DIAGNOSIS — J8489 Other specified interstitial pulmonary diseases: Secondary | ICD-10-CM | POA: Diagnosis present

## 2021-07-07 DIAGNOSIS — Z20822 Contact with and (suspected) exposure to covid-19: Secondary | ICD-10-CM | POA: Insufficient documentation

## 2021-07-07 DIAGNOSIS — R0781 Pleurodynia: Secondary | ICD-10-CM

## 2021-07-07 DIAGNOSIS — K219 Gastro-esophageal reflux disease without esophagitis: Secondary | ICD-10-CM

## 2021-07-07 LAB — URINALYSIS, ROUTINE W REFLEX MICROSCOPIC
Bacteria, UA: NONE SEEN
Bilirubin Urine: NEGATIVE
Glucose, UA: NEGATIVE mg/dL
Ketones, ur: NEGATIVE mg/dL
Leukocytes,Ua: NEGATIVE
Nitrite: NEGATIVE
Protein, ur: NEGATIVE mg/dL
Specific Gravity, Urine: 1.004 — ABNORMAL LOW (ref 1.005–1.030)
pH: 7 (ref 5.0–8.0)

## 2021-07-07 LAB — COMPREHENSIVE METABOLIC PANEL
ALT: 20 U/L (ref 0–44)
AST: 26 U/L (ref 15–41)
Albumin: 3.5 g/dL (ref 3.5–5.0)
Alkaline Phosphatase: 75 U/L (ref 38–126)
Anion gap: 9 (ref 5–15)
BUN: 7 mg/dL (ref 6–20)
CO2: 23 mmol/L (ref 22–32)
Calcium: 9.4 mg/dL (ref 8.9–10.3)
Chloride: 107 mmol/L (ref 98–111)
Creatinine, Ser: 0.6 mg/dL (ref 0.44–1.00)
GFR, Estimated: >60 mL/min (ref >60)
Glucose, Bld: 120 mg/dL — ABNORMAL HIGH (ref 70–99)
Potassium: 4 mmol/L (ref 3.5–5.1)
Sodium: 139 mmol/L (ref 135–145)
Total Bilirubin: 0.7 mg/dL (ref 0.3–1.2)
Total Protein: 7.7 g/dL (ref 6.5–8.1)

## 2021-07-07 LAB — LACTIC ACID, PLASMA
Lactic Acid, Venous: 1.1 mmol/L (ref 0.5–1.9)
Lactic Acid, Venous: 0.8 mmol/L (ref 0.5–1.9)

## 2021-07-07 LAB — CBC
HCT: 44.4 % (ref 36.0–46.0)
Hemoglobin: 14.8 g/dL (ref 12.0–15.0)
MCH: 30.8 pg (ref 26.0–34.0)
MCHC: 33.3 g/dL (ref 30.0–36.0)
MCV: 92.3 fL (ref 80.0–100.0)
Platelets: 234 10*3/uL (ref 150–400)
RBC: 4.81 MIL/uL (ref 3.87–5.11)
RDW: 11.8 % (ref 11.5–15.5)
WBC: 6.9 10*3/uL (ref 4.0–10.5)
nRBC: 0 % (ref 0.0–0.2)

## 2021-07-07 LAB — RESP PANEL BY RT-PCR (FLU A&B, COVID) ARPGX2
Influenza A by PCR: NEGATIVE
Influenza B by PCR: NEGATIVE
SARS Coronavirus 2 by RT PCR: NEGATIVE

## 2021-07-07 LAB — LIPASE, BLOOD: Lipase: 30 U/L (ref 11–51)

## 2021-07-07 LAB — HIV ANTIBODY (ROUTINE TESTING W REFLEX): HIV Screen 4th Generation wRfx: NONREACTIVE

## 2021-07-07 MED ORDER — ACETAMINOPHEN 500 MG PO TABS
1000.0000 mg | ORAL_TABLET | Freq: Three times a day (TID) | ORAL | Status: DC
  Administered 2021-07-07 – 2021-07-09 (×7): 1000 mg via ORAL
  Filled 2021-07-07 (×7): qty 2

## 2021-07-07 MED ORDER — ALBUTEROL SULFATE (2.5 MG/3ML) 0.083% IN NEBU
2.5000 mg | INHALATION_SOLUTION | Freq: Four times a day (QID) | RESPIRATORY_TRACT | Status: DC
  Administered 2021-07-07 – 2021-07-08 (×3): 2.5 mg via RESPIRATORY_TRACT
  Filled 2021-07-07 (×3): qty 3

## 2021-07-07 MED ORDER — IPRATROPIUM-ALBUTEROL 0.5-2.5 (3) MG/3ML IN SOLN
3.0000 mL | Freq: Once | RESPIRATORY_TRACT | Status: AC
  Administered 2021-07-07: 3 mL via RESPIRATORY_TRACT
  Filled 2021-07-07: qty 3

## 2021-07-07 MED ORDER — MORPHINE SULFATE (PF) 2 MG/ML IV SOLN
2.0000 mg | Freq: Once | INTRAVENOUS | Status: AC
  Administered 2021-07-07: 2 mg via INTRAVENOUS
  Filled 2021-07-07: qty 1

## 2021-07-07 MED ORDER — PANTOPRAZOLE SODIUM 40 MG PO TBEC
40.0000 mg | DELAYED_RELEASE_TABLET | Freq: Every day | ORAL | Status: DC
  Administered 2021-07-07 – 2021-07-09 (×3): 40 mg via ORAL
  Filled 2021-07-07 (×3): qty 1

## 2021-07-07 MED ORDER — METHYLPREDNISOLONE SODIUM SUCC 125 MG IJ SOLR
125.0000 mg | Freq: Once | INTRAMUSCULAR | Status: AC
  Administered 2021-07-07: 125 mg via INTRAVENOUS
  Filled 2021-07-07: qty 2

## 2021-07-07 MED ORDER — OXYCODONE HCL 5 MG PO TABS: 5.0000 mg | ORAL_TABLET | ORAL | Status: DC | PRN

## 2021-07-07 MED ORDER — ACETAMINOPHEN 325 MG PO TABS: 650.0000 mg | ORAL_TABLET | Freq: Four times a day (QID) | ORAL | Status: DC | PRN

## 2021-07-07 MED ORDER — OXYCODONE-ACETAMINOPHEN 5-325 MG PO TABS: 1.0000 | ORAL_TABLET | ORAL | Status: DC | PRN

## 2021-07-07 MED ORDER — KETOROLAC TROMETHAMINE 15 MG/ML IJ SOLN: 15.0000 mg | Freq: Four times a day (QID) | INTRAMUSCULAR | Status: DC | PRN

## 2021-07-07 MED ORDER — GUAIFENESIN-DM 100-10 MG/5ML PO SYRP
5.0000 mL | ORAL_SOLUTION | ORAL | Status: DC | PRN
  Administered 2021-07-07 – 2021-07-08 (×3): 5 mL via ORAL
  Filled 2021-07-07 (×2): qty 10
  Filled 2021-07-07: qty 5

## 2021-07-07 MED ORDER — IOHEXOL 350 MG/ML SOLN
75.0000 mL | Freq: Once | INTRAVENOUS | Status: AC | PRN
  Administered 2021-07-07: 75 mL via INTRAVENOUS

## 2021-07-07 MED ORDER — SODIUM CHLORIDE 0.9 % IV SOLN
1.0000 g | Freq: Once | INTRAVENOUS | Status: AC
  Administered 2021-07-07: 1 g via INTRAVENOUS
  Filled 2021-07-07: qty 10

## 2021-07-07 MED ORDER — SODIUM CHLORIDE 0.9 % IV SOLN
500.0000 mg | Freq: Once | INTRAVENOUS | Status: AC
  Administered 2021-07-07: 500 mg via INTRAVENOUS
  Filled 2021-07-07: qty 5

## 2021-07-07 MED ORDER — ENOXAPARIN SODIUM 40 MG/0.4ML IJ SOSY
40.0000 mg | PREFILLED_SYRINGE | INTRAMUSCULAR | Status: DC
  Administered 2021-07-07 – 2021-07-08 (×2): 40 mg via SUBCUTANEOUS
  Filled 2021-07-07 (×2): qty 0.4

## 2021-07-07 MED ORDER — ONDANSETRON HCL 4 MG/2ML IJ SOLN
4.0000 mg | Freq: Once | INTRAMUSCULAR | Status: AC
  Administered 2021-07-07: 4 mg via INTRAVENOUS
  Filled 2021-07-07: qty 2

## 2021-07-07 MED ORDER — KETOROLAC TROMETHAMINE 30 MG/ML IJ SOLN
30.0000 mg | Freq: Four times a day (QID) | INTRAMUSCULAR | Status: DC | PRN
  Administered 2021-07-09: 30 mg via INTRAVENOUS
  Filled 2021-07-07: qty 1

## 2021-07-07 NOTE — Hospital Course (Addendum)
Woke up at 2AM because of her cough and started having her flank pain again. She asked for cough medicine but was told it was not ordered (robitussin-AC was ordered yesterday). ? ?Having pain around lower ribs. ? ?Has not gone to the bathroom for the past two days. ?___________________________________________________ ?R sided flank pain x2 days ? ?Coughing frequently (pulm fibrosis) which makes pain worse. ? ?Desatted to 78% with ambulation. ? ? ?S/p cholecystectomy ? ? ?'10mg'$  prednisone daily, stopped a few days ago. ? ? ?Follows Dr. Melvyn Novas, needs home O2 ? ?CT chest/abd/pel - no PE, cannot exclude underlying infiltrate ?Significant underlying lung disease ? ?Steroids ?Rocephin, azithromycin ?___________________________________________________ ? ?Olivia [name] comes in today for right sided flank pain. Starts in the front and goes to the back. Started 3 days ago. Pain is burning in quality (not in her chest, just on right side).  ? ?Felt a lot worse this morning, but feels better after getting to ED. States that her pain has been going on for a while and that she has fibrosis in her lungs. ? ?Has history of kidney stones. Pain feels similar but this pain is constant and worsens when she coughs. Pain does not change with eating. Nothing makes the pain better. She has a little bit of nausea and had one episode of vomiting yesterday. She vomited after having a long coughing episode. She vomited up phlegm, no blood noted. ? ?She is on omeprazole for acid reflux. ? ?She does not have any shortness of breath with rest. She has had shortness of breath with ambulation for a long time now. She follows Dr. Melvyn Novas (Pulmonology) for lung disease. She is on chronic prednisone therapy for this. She has not taken prednisone for the past 2 days. She used to be on gabapentin, but no longer taking it. She is also on albuterol, which she uses about twice a day (morning and night). It does help with her breathing, last used  yesterday. ? ?Denies any fevers, chills, worsening shortness of breath, worsening cough, increased sputum production, changes in urination ? ?Endorses congestion, baseline cough, intermittent constipation, increased fatigue.  ? ? ?Family history:  ?- mother:  ?- father: HTN ?- no history of lung disease aside from asthma in one sister ? ?Lives with husband. Does not work anymore, previously was a Secretary/administrator. Used to work with a lot of chlorine, which may be cause of lung disease per her lung doctor. Never smoked or used chewing tobacco. No recreational drug use. Used to occasionally drink alcohol but not after lung disease worsened. ?

## 2021-07-07 NOTE — H&P (Addendum)
Date: 07/07/2021               Patient Name:  Olivia [name] MRN: [id number]  DOB: 1965-07-28 Age / Sex: 56 y.o., female   PCP: Delene Ruffini, MD              Medical Service: Internal Medicine Teaching Service              Attending Physician: Dr. Aldine Contes, MD    First Contact: Vernell Leep, [place] [facility] Pager: [phone]  Second Contact: Dr. Scarlett Presto Pager: [phone]  Third Contact Dr. Virl Axe Pager: [phone]       After Hours (After 5p/  First Contact Pager: [phone]  weekends / holidays): Second Contact Pager: [phone]   Chief Complaint: Cough, right sided flank pain  History of Present Illness:  [name] is a 56yo with a past medical hx of NSIP, GERD, nephrolithiasis, and  gallstones S/p cholecystectomy, here today for worsening cough and right sided flank pain. Patient states pain starts in the front R abdomen and radiates to the back. Pain started 3 days ago and is burning in quality (not in her chest, just on right side). She states pain has been going on for a while but worsened this morning, she feels better after getting to ED. Pain does not change with eating. On omeprazole for GERD. States she has a history of kidney stones, states pain feels similar but this pain is constant and worsens when she coughs. Nothing makes the pain better. Denies any changes to urination, diarrhea or dysuria. Endorses intermittent constipation, last BM was yesterday. Patient endorses a little bit of nausea and has had one episode of vomiting yesterday. She vomited non-bloody phlegm after having a long coughing episode. Patient states she vomits phlegm daily and occassionally will additionally vomit food during these episodes.  Patient states she has fibrosis in her lungs diagnosed in 2020. Patient is followed by outpatient pulmonologist, Dr. Melvyn Novas. She is not endorsing shortness of breath with rest today but has had shortness of breath with ambulation for a long time now.  Was supposed to get home oxygen but was not able to due absence of insurance. She is currently on chronic prednisone '10mg'$  and gabapentin '100mg'$ , but has not taken either medication for the past 2 days due to fear that meds were worsening pain. She is also on albuterol prn, currently uses about twice a day (morning and night). Breathing improved with albuterol use, last used yesterday. Patient endorsing worsened congestion and increased sputum production, increased fatigue, cough currently at baseline. Denies any fevers, chills, worsening shortness of breath. Patient has long standing history of interstitial lung disease, most consistent with nonspecific interstitial pneumonia, is followed by Dr. Melvyn Novas outpatient--last seen 10/22. She is on '10mg'$  prednisone daily and usually takes it once daily, but has been instructed she can take two prn. Albuterol prn. Desats with ambulation to low 80s to 70s. Needs home oxygen, patient is uninsured and has had difficulty obtaining. Started on Azithro and ceftriaxone in the ED for potential pneumonia.     Meds:  Current Meds  Medication Sig   albuterol (VENTOLIN HFA) 108 (90 Base) MCG/ACT inhaler Inhale 2 puffs into the lungs every 6 (six) hours as needed for wheezing or shortness of breath.   gabapentin (NEURONTIN) 100 MG capsule One three times daily (Patient taking differently: Take 100 mg by mouth 3 (three) times daily. One three times daily)   omeprazole (PRILOSEC) 20 MG capsule Take 30-  60 min before your first and last meals of the day (Patient taking differently: Take 20 mg by mouth 2 (two) times daily before a meal.)   predniSONE (DELTASONE) 10 MG tablet Take 1 tablet (10 mg total) by mouth daily with breakfast.     Allergies: Allergies as of 07/07/2021   (No Known Allergies)   Past Medical History:  Diagnosis Date   GERD (gastroesophageal reflux disease)    Pulmonary fibrosis (HCC)     Family History:  - mother and two brothers with DM - father  with HTN - no history of cardiac or lung disease aside from asthma in one sister   Social History:  Lives with husband. Does not work anymore, previously was a Secretary/administrator. Used to work with a lot of chlorine, which may be cause of lung disease per her lung doctor. Never smoked or used chewing tobacco. No recreational drug use. Used to occasionally drink alcohol but not after lung disease worsened.  Review of Systems: A complete ROS was negative except as per HPI.   Physical Exam: Blood pressure 138/81, pulse 88, temperature 98.4 F (36.9 C), temperature source Oral, resp. rate (!) 24, height '4\' 9"'$  (1.448 m), weight 73.9 kg, SpO2 96 %.  Constitutional: talkative and alert, in no acute distress  HEENT: moist mucus membranes, no throat erythema, tonsils normal sized Cardio: normal rate and rhythm, normal s1, s2, equal pulses of upper and lower extremities Pulm: normal work of breathing on 2L oxygen, coarse breath sounds, inspiratory crackles and rales throughout Abd: non-distended, +normal bowel sounds, pain to palpation of the RUQ and RLQ that radiates to back, no CVA tenderness Skin: warm and dry to touch, no rashes appreciated Extremities: moving all extremities spontaneously, no edema present, clubbing of fingernails present  Neuro: Alert and oriented x3 Psych: Pleasant affect  EKG: personally reviewed my interpretation EKG shows normal sinus rhythm.  CXR: personally reviewed my interpretation is: Trachea at midline, patchy opacities present in bilateral lower lobes. Heart does not appear enlarged, no bony deformities, costovertebral angle present, no evidence of pneumothorax or pleural effusion.  Results for orders placed or performed during the hospital encounter of 07/07/21 (from the past 24 hour(s))  Urinalysis, Routine w reflex microscopic Urine, Clean Catch     Status: Abnormal   Collection Time: 07/07/21  9:26 AM  Result Value Ref Range   Color, Urine STRAW (A) YELLOW    APPearance CLEAR CLEAR   Specific Gravity, Urine 1.004 (L) 1.005 - 1.030   pH 7.0 5.0 - 8.0   Glucose, UA NEGATIVE NEGATIVE mg/dL   Hgb urine dipstick SMALL (A) NEGATIVE   Bilirubin Urine NEGATIVE NEGATIVE   Ketones, ur NEGATIVE NEGATIVE mg/dL   Protein, ur NEGATIVE NEGATIVE mg/dL   Nitrite NEGATIVE NEGATIVE   Leukocytes,Ua NEGATIVE NEGATIVE   RBC / HPF 0-5 0 - 5 RBC/hpf   Bacteria, UA NONE SEEN NONE SEEN   Squamous Epithelial / LPF 0-5 0 - 5  Lipase, blood     Status: None   Collection Time: 07/07/21  9:35 AM  Result Value Ref Range   Lipase 30 11 - 51 U/L  Comprehensive metabolic panel     Status: Abnormal   Collection Time: 07/07/21  9:35 AM  Result Value Ref Range   Sodium 139 135 - 145 mmol/L   Potassium 4.0 3.5 - 5.1 mmol/L   Chloride 107 98 - 111 mmol/L   CO2 23 22 - 32 mmol/L   Glucose, Bld 120 (H) 70 -  99 mg/dL   BUN 7 6 - 20 mg/dL   Creatinine, Ser 0.60 0.44 - 1.00 mg/dL   Calcium 9.4 8.9 - 10.3 mg/dL   Total Protein 7.7 6.5 - 8.1 g/dL   Albumin 3.5 3.5 - 5.0 g/dL   AST 26 15 - 41 U/L   ALT 20 0 - 44 U/L   Alkaline Phosphatase 75 38 - 126 U/L   Total Bilirubin 0.7 0.3 - 1.2 mg/dL   GFR, Estimated >60 >60 mL/min   Anion gap 9 5 - 15  CBC     Status: None   Collection Time: 07/07/21  9:35 AM  Result Value Ref Range   WBC 6.9 4.0 - 10.5 K/uL   RBC 4.81 3.87 - 5.11 MIL/uL   Hemoglobin 14.8 12.0 - 15.0 g/dL   HCT 44.4 36.0 - 46.0 %   MCV 92.3 80.0 - 100.0 fL   MCH 30.8 26.0 - 34.0 pg   MCHC 33.3 30.0 - 36.0 g/dL   RDW 11.8 11.5 - 15.5 %   Platelets 234 150 - 400 K/uL   nRBC 0.0 0.0 - 0.2 %  Resp Panel by RT-PCR (Flu A&B, Covid) Nasopharyngeal Swab     Status: None   Collection Time: 07/07/21 10:02 AM   Specimen: Nasopharyngeal Swab; Nasopharyngeal(NP) swabs in vial transport medium  Result Value Ref Range   SARS Coronavirus 2 by RT PCR NEGATIVE NEGATIVE   Influenza A by PCR NEGATIVE NEGATIVE   Influenza B by PCR NEGATIVE NEGATIVE  Lactic acid, plasma      Status: None   Collection Time: 07/07/21 10:35 AM  Result Value Ref Range   Lactic Acid, Venous 1.1 0.5 - 1.9 mmol/L  Lactic acid, plasma     Status: None   Collection Time: 07/07/21  2:05 PM  Result Value Ref Range   Lactic Acid, Venous 0.8 0.5 - 1.9 mmol/L  HIV Antibody (routine testing w rflx)     Status: None   Collection Time: 07/07/21  2:05 PM  Result Value Ref Range   HIV Screen 4th Generation wRfx Non Reactive Non Reactive     Assessment & Plan by Problem: [name] is a 56yo with a past medical hx of NSIP, GERD, nephrolithiasis, and  gallstones S/p cholecystectomy, here today for cough and right sided flank pain likely 2/2 to worsening NSIP.   Principal Problem:   Acute on chronic respiratory failure with hypoxia (HCC) Active Problems:   Chronic cough   NSIP (nonspecific interstitial pneumonia) (HCC)   GERD (gastroesophageal reflux disease)  Acute on chronic respiratory failure with hypoxia Chronic cough Right-sided flank pain Nonspecific Interstitial Pneumonia Patient with history of Interstitial lung disease first diagnosed in 2020 as nonspecific interstitial pneumonia. Lung CT consistent with progressive interstitial lung disease showing heterogeneous areas of interstitial thickening with intervening ground-glass opacities, which have progressed compared to the prior chest CT from 11/21. Superimposed infection however, could not be ruled out by imaging alone. Patient has cough productive of sputum, SOB with exertion, and progressive fatigue. However, patient is afebrile today and had neg respiratory panel, normal Lactic acid to 0.8, and WBC to 6.9, making infection less likely. Started on Azithro and ceftriaxone in the ED for potential pneumonia, will continue for today but likely can discontinue tomorrow. Patient also endorsing right-sided flank pain that is worse with cough of the RUQ and RLQ that radiates to back. Could be kidney stones given patients remote hx of  kidney stones or other urogenital etiology. However,  CT abd/pel is negative for stones. The patient is also not currently endorsing associated sx such as nausea, vomiting or pain with urination that would make this diagnosis more likely. CT was also negative for other intraabdominal pathology. Pain could be due to costochondritis, given pain to palpation of right lower ribs, likely contributory. Most likely pain is pleuritic given it is worse with deep inspiration and 2/2 to worsening NSIP given findings on imaging and increased lung inflammation due to medication cessation two days ago. Will continue home meds albuterol and start Oxycodone '5mg'$  for pain. Patient will likely not need long-term treatment and will need to be discharged on oxygen. -f/u blood cultures -albuterol nebs 2.'5mg'$  q6h -prn Robitussin for chronic cough -azithromycin and ceftriaxone -supplemental O2 prn to maintain O2 sats >90% -schedule tylenol q8, toradol q6 prn for moderate pain, oxycodone q4 prn for severe pain -ICS  GERD GERD has been stable on PPI previously, burning chest pain described on exam likely associated with chronic cough. Will start PPI. -pantoprazole '40mg'$  daily  Hx of depression Not on currently on medications, mood is stable today during exam. -CTM mental status  Hx of dyslipidemia -Not on currently on medications. -f/u lipid panel     Fluids: none VTE: Lovenox '40mg'$  daily Code: Full  Dispo: Admit patient to Observation with expected length of stay less than 2 midnights.   Signed: Nada Maclachlan, MS3 Pager: [phone] 07/07/2021, 5:14 PM    Attestation for Student Documentation:  I personally was present and performed or re-performed the history, physical exam and medical decision-making activities of this service and have verified that the service and findings are accurately documented in the students note.  Virl Axe, MD 07/07/2021, 5:19 PM

## 2021-07-07 NOTE — ED Notes (Signed)
Pt ambulated to the bathroom with a standby assist. Upon return to pts room her oxygen saturation was noted to be between 78-82% on room air, notified MD and was told to place on 2L [place], oxygen saturation now at 98%. Claiborne Billings RN also notified. ?

## 2021-07-07 NOTE — TOC Initial Note (Addendum)
Transition of Care (TOC) - Initial/Assessment Note  ? ? ?Patient Details  ?Name: Olivia [name] ?MRN: [id number] ?Date of Birth: 1966/02/06 ? ?Transition of Care (TOC) CM/SW Contact:    ?Verdell Carmine, RN ?Phone Number: ?07/07/2021, 2:56 PM ? ?Clinical Narrative:                 ? ?Patient presents to the ED with flank pain. Has history of pulmonary fibrosis, kidney stones.  Had nausea vomiting at home.  ?Hypoxic on presentation. Interstitional lung disease on chest Xray.  ?Patient does not have insurance. Previously had charity care through [place] office 2015-2016 sent message to [facility] regarding assistance for this admission. ? ?She may need MATCH for Medications and oxygen going home. Marland Kitchen ?CM will follow for needs, recommendations, and transitions. ?Expected Discharge Plan: Home/Self Care ?Barriers to Discharge: Continued Medical Work up ? ? ?Patient Goals and CMS Choice ?  ?  ?  ? ?Expected Discharge Plan and Services ?Expected Discharge Plan: Home/Self Care ?  ?Discharge Planning Services: CM Consult ?  ?Living arrangements for the past 2 months: [place] ?                ?  ?  ?  ?  ?  ?  ?  ?  ?  ?  ? ?Prior Living Arrangements/Services ?Living arrangements for the past 2 months: [place] ?  ?Patient language and need for interpreter reviewed:: Yes ?       ?Need for Family Participation in Patient Care: Yes (Comment) ?Care giver support system in place?: Yes (comment) ?  ?Criminal Activity/Legal Involvement Pertinent to Current Situation/Hospitalization: No - Comment as needed ? ?Activities of Daily Living ?  ?  ? ?Permission Sought/Granted ?  ?  ?   ?   ?   ?   ? ?Emotional Assessment ?  ?  ?  ?Orientation: : Oriented to Self, Oriented to Place, Oriented to  Time, Oriented to Situation ?Alcohol / Substance Use: Not Applicable ?Psych Involvement: No (comment) ? ?Admission diagnosis:  NSIP (nonspecific interstitial pneumonia) ([place]) [J84.89] ?Patient Active Problem List  ? Diagnosis  Date Noted  ? Abdominal pain 06/06/2021  ? NSIP (nonspecific interstitial pneumonia) ([place]) 05/26/2020  ? Acute respiratory distress 03/27/2020  ? History of COVID-19 09/02/2019  ? Interstitial pulmonary disease ([place]) 06/28/2019  ? Healthcare maintenance 06/28/2019  ? Elevated blood pressure reading 12/21/2018  ? Dyslipidemia 12/21/2018  ? Screening breast examination 11/26/2018  ? Exercise hypoxemia 08/05/2018  ? Pulmonary infiltrates 08/04/2018  ? Chronic cough 03/20/2018  ? Right nephrolithiasis 09/07/2017  ? History of depression 02/14/2017  ? Preventative health care 01/15/2013  ? Microscopic hematuria 06/13/2008  ? HEMORRHOIDS, NOS 07/03/2006  ? ?PCP:  Delene Ruffini, MD ?Pharmacy:   ?[facility] [id number] ?[place] ?[place] [address] ?Phone: [phone] Fax: [phone] ? ? ? ? ?Social Determinants of Health (SDOH) Interventions ?  ? ?Readmission Risk Interventions ?No flowsheet data found. ? ? ?

## 2021-07-07 NOTE — ED Notes (Signed)
Patient transported to CT 

## 2021-07-07 NOTE — ED Triage Notes (Signed)
C/o R sided abd pain and flank pain x 3 days with nausea.  Denies vomiting, diarrhea, and urinary complaints.  Reports constipation- last BM yesterday. ?

## 2021-07-07 NOTE — ED Provider Notes (Signed)
[facility] EMERGENCY DEPARTMENT Provider Note   CSN: [id number] Arrival date & time: 07/07/21  2505     History  Chief Complaint  Patient presents with   Abdominal Pain    Olivia [name] is a 56 y.o. female.  56 year old female with prior medical history as detailed below presents for evaluation.  Patient complains of right-sided flank and right upper quadrant pain for the last 3 days.  Pain is associate with nausea.  She denies vomiting.  She denies diarrhea.  She denies urinary symptoms.  Her last reported bowel movement was yesterday.  Patient is coughing frequently during the exam.  Patient reports that she does have a history of pulmonary fibrosis.  She does not feel that her breathing is more short today than her typical.  With ambulation patient is noted to desat into the low 80s.  Patient's reported pain is worse with coughing.  Patient reports prior history of cholecystectomy.  She believes that her gallbladder was removed secondary to gallstones.  She denies other abdominal procedures.  Translator services utilized for entirety of exam.  The history is provided by the patient, a relative and medical records. A language interpreter was used.  Abdominal Pain Pain location:  RUQ Pain quality: aching and cramping   Pain radiates to:  Does not radiate Pain severity:  Moderate Onset quality:  Gradual Duration:  3 days Timing:  Constant Progression:  Worsening Chronicity:  New     Home Medications Prior to Admission medications   Medication Sig Start Date End Date Taking? Authorizing Provider  albuterol (VENTOLIN HFA) 108 (90 Base) MCG/ACT inhaler Inhale 2 puffs into the lungs every 6 (six) hours as needed for wheezing or shortness of breath. 02/09/21   Tanda Rockers, MD  gabapentin (NEURONTIN) 100 MG capsule One three times daily 07/20/20   Tanda Rockers, MD  omeprazole (PRILOSEC) 20 MG capsule Take 30- 60 min before your first and last  meals of the day Patient taking differently: Take 20 mg by mouth 2 (two) times daily before a meal. 08/04/18   Tanda Rockers, MD  predniSONE (DELTASONE) 10 MG tablet Take 1 tablet (10 mg total) by mouth daily with breakfast. 07/20/20   Tanda Rockers, MD      Allergies    Patient has no known allergies.    Review of Systems   Review of Systems  Gastrointestinal:  Positive for abdominal pain.  All other systems reviewed and are negative.  Physical Exam Updated Vital Signs BP (!) 132/96    Pulse 79    Temp 98.4 F (36.9 C) (Oral)    Resp 19    Ht '4\' 9"'$  (1.448 m)    Wt 73.9 kg    LMP  (LMP Unknown)    SpO2 95%    BMI 35.27 kg/m  Physical Exam Vitals and nursing note reviewed.  Constitutional:      General: She is not in acute distress.    Appearance: Normal appearance. She is well-developed.  HENT:     Head: Normocephalic and atraumatic.  Eyes:     Conjunctiva/sclera: Conjunctivae normal.     Pupils: Pupils are equal, round, and reactive to light.  Cardiovascular:     Rate and Rhythm: Normal rate and regular rhythm.     Heart sounds: Normal heart sounds.  Pulmonary:     Effort: Pulmonary effort is normal. No respiratory distress.     Breath sounds: Wheezing present.     Comments:  Few scattered expiratory wheezes in all lung fields Abdominal:     General: There is no distension.     Palpations: Abdomen is soft.     Tenderness: There is no abdominal tenderness.     Comments: Mild tenderness to palpation overlying the right upper quadrant.   Musculoskeletal:        General: No deformity. Normal range of motion.     Cervical back: Normal range of motion and neck supple.  Skin:    General: Skin is warm and dry.  Neurological:     General: No focal deficit present.     Mental Status: She is alert and oriented to person, place, and time.    ED Results / Procedures / Treatments   Labs (all labs ordered are listed, but only abnormal results are displayed) Labs Reviewed   COMPREHENSIVE METABOLIC PANEL - Abnormal; Notable for the following components:      Result Value   Glucose, Bld 120 (*)    All other components within normal limits  RESP PANEL BY RT-PCR (FLU A&B, COVID) ARPGX2  CULTURE, BLOOD (ROUTINE X 2)  CULTURE, BLOOD (ROUTINE X 2)  LIPASE, BLOOD  CBC  URINALYSIS, ROUTINE W REFLEX MICROSCOPIC  LACTIC ACID, PLASMA  LACTIC ACID, PLASMA    EKG EKG Interpretation  Date/Time:  Saturday July 07 2021 10:04:46 EST Ventricular Rate:  77 PR Interval:  137 QRS Duration: 82 QT Interval:  379 QTC Calculation: 429 R Axis:   74 Text Interpretation: Sinus rhythm Confirmed by Dene Gentry [phone]) on 07/07/2021 10:05:24 AM  Radiology DG Chest Port 1 View  Result Date: 07/07/2021 CLINICAL DATA:  56 year old female with cough, right side pain. EXAM: PORTABLE CHEST 1 VIEW COMPARISON:  CTA chest 03/27/2020 and earlier. FINDINGS: Portable AP semi upright view at 1015 hours. Low lung volumes, mildly decreased since 2021. Cardiac size is stable at the upper limits of normal. Other mediastinal contours are within normal limits. Visualized tracheal air column is within normal limits. Chronic pulmonary interstitial changes, also described on 07/09/2019 high-resolution chest CT. No pneumothorax, pleural effusion or definite acute Pulmonary could opacity compared to priors. Negative visible bowel gas. Stable cholecystectomy clips. No acute osseous abnormality identified. IMPRESSION: Chronic pulmonary interstitial disease and low lung volumes. No acute cardiopulmonary abnormality. Electronically Signed   By: Genevie Ann M.D.   On: 07/07/2021 10:23    Procedures Procedures    Medications Ordered in ED Medications  ipratropium-albuterol (DUONEB) 0.5-2.5 (3) MG/3ML nebulizer solution 3 mL (3 mLs Nebulization Given 07/07/21 1023)  morphine (PF) 2 MG/ML injection 2 mg (2 mg Intravenous Given 07/07/21 1030)  ondansetron (ZOFRAN) injection 4 mg (4 mg Intravenous Given 07/07/21 1030)     ED Course/ Medical Decision Making/ A&P                           Medical Decision Making Amount and/or Complexity of Data Reviewed Labs: ordered. Radiology: ordered.  Risk Prescription drug management. Decision regarding hospitalization.    Medical Screen Complete  This patient presented to the ED with complaint of abdominal pain, right flank pain, cough, wheezing.  This complaint involves an extensive number of treatment options. The initial differential diagnosis includes, but is not limited to, pulmonary infection, intra-abdominal infection, bronchospasm, renal colic, metabolic abnormality, etc.  This presentation is: Acute, Chronic, Self-Limited, Previously Undiagnosed, Uncertain Prognosis, Complicated, Systemic Symptoms, and Threat to Life/Bodily Function    Co morbidities that complicated the patient's evaluation  Pulmonary fibrosis   Additional history obtained:  Additional history obtained from Spouse External records from outside sources obtained and reviewed including prior ED visits and prior Inpatient records.    Lab Tests:  I ordered and personally interpreted labs.  The pertinent results include: CBC, CMP, lipase, COVID, flu, lactic acid, UA   Imaging Studies ordered:  I ordered imaging studies including chest x-ray, CTA chest, CT abdomen pelvis I independently visualized and interpreted obtained imaging which showed chronic opacities consistent with history of pulmonary fibrosis.  Unable to exclude acute infiltrate I agree with the radiologist interpretation.   Cardiac Monitoring:  The patient was maintained on a cardiac monitor.  I personally viewed and interpreted the cardiac monitor which showed an underlying rhythm of: NSR   Medicines ordered:  I ordered medication including Solu-Medrol, Rocephin, azithromycin for bronchospasm, possible pulmonary infection Reevaluation of the patient after these medicines showed that the patient: stayed  the same    Problem List / ED Course:  Hypoxia, shortness of breath, cough, right flank pain   Reevaluation:  After the interventions noted above, I reevaluated the patient and found that they have: improved   disposition:  After consideration of the diagnostic results and the patients response to treatment, I feel that the patent would benefit from admission for further work-up and treatment.          Final Clinical Impression(s) / ED Diagnoses Final diagnoses:  Pulmonary fibrosis ([place])  Hypoxia    Rx / DC Orders ED Discharge Orders     None         Valarie Merino, MD 07/07/21 [phone]

## 2021-07-08 DIAGNOSIS — K219 Gastro-esophageal reflux disease without esophagitis: Secondary | ICD-10-CM

## 2021-07-08 DIAGNOSIS — E785 Hyperlipidemia, unspecified: Secondary | ICD-10-CM

## 2021-07-08 DIAGNOSIS — J849 Interstitial pulmonary disease, unspecified: Secondary | ICD-10-CM

## 2021-07-08 DIAGNOSIS — F32A Depression, unspecified: Secondary | ICD-10-CM

## 2021-07-08 DIAGNOSIS — I700 Atherosclerosis of aorta: Secondary | ICD-10-CM

## 2021-07-08 DIAGNOSIS — J9621 Acute and chronic respiratory failure with hypoxia: Secondary | ICD-10-CM

## 2021-07-08 LAB — CBC WITH DIFFERENTIAL/PLATELET
Abs Immature Granulocytes: 0 10*3/uL (ref 0.00–0.07)
Basophils Absolute: 0 10*3/uL (ref 0.0–0.1)
Basophils Relative: 0 %
Eosinophils Absolute: 0 10*3/uL (ref 0.0–0.5)
Eosinophils Relative: 0 %
HCT: 39.9 % (ref 36.0–46.0)
Hemoglobin: 13.5 g/dL (ref 12.0–15.0)
Lymphocytes Relative: 12 %
Lymphs Abs: 0.5 10*3/uL — ABNORMAL LOW (ref 0.7–4.0)
MCH: 31 pg (ref 26.0–34.0)
MCHC: 33.8 g/dL (ref 30.0–36.0)
MCV: 91.5 fL (ref 80.0–100.0)
Monocytes Absolute: 0.1 10*3/uL (ref 0.1–1.0)
Monocytes Relative: 2 %
Neutro Abs: 3.7 10*3/uL (ref 1.7–7.7)
Neutrophils Relative %: 85 %
Platelets: 241 10*3/uL (ref 150–400)
Promyelocytes Relative: 1 %
RBC: 4.36 MIL/uL (ref 3.87–5.11)
RDW: 11.6 % (ref 11.5–15.5)
WBC: 4.4 10*3/uL (ref 4.0–10.5)
nRBC: 0 % (ref 0.0–0.2)
nRBC: 0 /100 WBC

## 2021-07-08 LAB — BASIC METABOLIC PANEL
Anion gap: 10 (ref 5–15)
BUN: 7 mg/dL (ref 6–20)
CO2: 22 mmol/L (ref 22–32)
Calcium: 9.1 mg/dL (ref 8.9–10.3)
Chloride: 104 mmol/L (ref 98–111)
Creatinine, Ser: 0.49 mg/dL (ref 0.44–1.00)
GFR, Estimated: >60 mL/min (ref >60)
Glucose, Bld: 176 mg/dL — ABNORMAL HIGH (ref 70–99)
Potassium: 3.9 mmol/L (ref 3.5–5.1)
Sodium: 136 mmol/L (ref 135–145)

## 2021-07-08 LAB — LIPID PANEL
Cholesterol: 181 mg/dL (ref 0–200)
HDL: 50 mg/dL (ref >40)
LDL Cholesterol: 122 mg/dL — ABNORMAL HIGH (ref 0–99)
Total CHOL/HDL Ratio: 3.6 RATIO
Triglycerides: 45 mg/dL (ref <150)
VLDL: 9 mg/dL (ref 0–40)

## 2021-07-08 LAB — HEMOGLOBIN A1C
Hgb A1c MFr Bld: 5.2 % (ref 4.8–5.6)
Mean Plasma Glucose: 102.54 mg/dL

## 2021-07-08 MED ORDER — ALBUTEROL SULFATE HFA 108 (90 BASE) MCG/ACT IN AERS
1.0000 | INHALATION_SPRAY | Freq: Four times a day (QID) | RESPIRATORY_TRACT | Status: DC | PRN
  Filled 2021-07-08: qty 6.7

## 2021-07-08 MED ORDER — IPRATROPIUM-ALBUTEROL 0.5-2.5 (3) MG/3ML IN SOLN
3.0000 mL | Freq: Four times a day (QID) | RESPIRATORY_TRACT | Status: DC
  Administered 2021-07-08 (×2): 3 mL via RESPIRATORY_TRACT
  Filled 2021-07-08 (×2): qty 3

## 2021-07-08 MED ORDER — COVID-19MRNA BIVAL VACC PFIZER 30 MCG/0.3ML IM SUSP
0.3000 mL | Freq: Once | INTRAMUSCULAR | Status: DC
Start: 2021-07-08 — End: 2021-07-09
  Filled 2021-07-08: qty 0.3

## 2021-07-08 MED ORDER — PREDNISONE 20 MG PO TABS: 60.0000 mg | ORAL_TABLET | Freq: Every day | ORAL | Status: DC

## 2021-07-08 MED ORDER — IPRATROPIUM-ALBUTEROL 0.5-2.5 (3) MG/3ML IN SOLN
3.0000 mL | Freq: Three times a day (TID) | RESPIRATORY_TRACT | Status: DC
  Administered 2021-07-08: 3 mL via RESPIRATORY_TRACT
  Filled 2021-07-08: qty 3

## 2021-07-08 MED ORDER — GUAIFENESIN-CODEINE 100-10 MG/5ML PO SOLN
5.0000 mL | Freq: Four times a day (QID) | ORAL | Status: DC | PRN
  Administered 2021-07-09: 5 mL via ORAL
  Filled 2021-07-08: qty 5

## 2021-07-08 MED ORDER — PREDNISONE 20 MG PO TABS
60.0000 mg | ORAL_TABLET | Freq: Every day | ORAL | Status: DC
  Administered 2021-07-08 – 2021-07-09 (×2): 60 mg via ORAL
  Filled 2021-07-08 (×2): qty 3

## 2021-07-08 NOTE — Progress Notes (Signed)
HD#0 Subjective:  Overnight Events: NAEO   Patient says she is feeling much better today with the pain medications and oxygen. She has not had a COVID shot since the first two doses. She said she would get another one today if it is necessary.   Objective:  Vital signs in last 24 hours: Vitals:   07/07/21 2122 07/08/21 0124 07/08/21 0711 07/08/21 0720  BP: 122/81  108/72   Pulse: 89  (!) 55   Resp: 16  16   Temp: 98.5 F (36.9 C)  97.9 F (36.6 C)   TempSrc: Oral  Oral   SpO2: 97% 97% 98% 98%  Weight:      Height:       Supplemental O2: Nasal Cannula SpO2: 98 % O2 Flow Rate (L/min): 2 L/min   Physical Exam:  Constitutional: Middle aged woman resting comfortably in bed, in no acute distress HEENT: normocephalic atraumatic, mucous membranes moist, conjunctiva non-erythematous Cardiovascular: regular rate and rhythm, no m/r/g Pulmonary/Chest: normal work of breathing on 2L, diffuse crackles and rales throughout. Coarse breath sounds, Cough triggered with deep inspiration Abdominal: soft, non-tender, non-distended MSK: normal bulk and tone Neurological: alert & oriented x 3, answering questions appropriately Skin: warm and dry Psych: normal affect  Filed Weights   07/07/21 0934  Weight: 73.9 kg     Intake/Output Summary (Last 24 hours) at 07/08/2021 1043 Last data filed at 07/07/2021 1627 Gross per 24 hour  Intake 350 ml  Output --  Net 350 ml   Net IO Since Admission: 350 mL [07/08/21 1043]  Pertinent Labs: CBC Latest Ref Rng & Units 07/08/2021 07/07/2021 03/29/2020  WBC 4.0 - 10.5 K/uL 4.4 6.9 9.2  Hemoglobin 12.0 - 15.0 g/dL 13.5 14.8 14.2  Hematocrit 36.0 - 46.0 % 39.9 44.4 42.3  Platelets 150 - 400 K/uL 241 234 313    CMP Latest Ref Rng & Units 07/08/2021 07/07/2021 05/30/2021  Glucose 70 - 99 mg/dL 176(H) 120(H) 83  BUN 6 - 20 mg/dL '7 7 12  '$ Creatinine 0.44 - 1.00 mg/dL 0.49 0.60 0.61  Sodium 135 - 145 mmol/L 136 139 138  Potassium 3.5 - 5.1 mmol/L 3.9 4.0  4.2  Chloride 98 - 111 mmol/L 104 107 102  CO2 22 - 32 mmol/L '22 23 20  '$ Calcium 8.9 - 10.3 mg/dL 9.1 9.4 9.6  Total Protein 6.5 - 8.1 g/dL - 7.7 -  Total Bilirubin 0.3 - 1.2 mg/dL - 0.7 -  Alkaline Phos 38 - 126 U/L - 75 -  AST 15 - 41 U/L - 26 -  ALT 0 - 44 U/L - 20 -    Imaging: CT Angio Chest PE W and/or Wo Contrast  Result Date: 07/07/2021 CLINICAL DATA:  Decreased oxygen saturation. Clinical concern for pulmonary embolism. Patient also with right flank and upper quadrant pain. History of a cholecystectomy. EXAM: CT ANGIOGRAPHY CHEST CT ABDOMEN AND PELVIS WITH CONTRAST TECHNIQUE: Multidetector CT imaging of the chest was performed using the standard protocol during bolus administration of intravenous contrast. Multiplanar CT image reconstructions and MIPs were obtained to evaluate the vascular anatomy. Multidetector CT imaging of the abdomen and pelvis was performed using the standard protocol during bolus administration of intravenous contrast. RADIATION DOSE REDUCTION: This exam was performed according to the departmental dose-optimization program which includes automated exposure control, adjustment of the mA and/or kV according to patient size and/or use of iterative reconstruction technique. CONTRAST:  16m OMNIPAQUE IOHEXOL 350 MG/ML SOLN COMPARISON:  CT a chest,  03/27/2020.  CT abdomen pelvis, 05/11/2010. FINDINGS: CTA CHEST FINDINGS Cardiovascular: Pulmonary arteries are well opacified. There is no evidence of a pulmonary embolism. Heart is top-normal in size. No pericardial effusion. Great vessels are normal in caliber. Aorta is minimally opacified. No convincing dissection. Minimal atherosclerosis. Mediastinum/Nodes: Normal thyroid. No neck base, mediastinal or hilar masses. There are prominent, shoddy mediastinal lymph nodes, subcentimeter. There are prominent to borderline enlarged bilateral hilar lymph nodes, largest along the inferior left hilum, 1.1 cm in short axis. Trachea and  esophagus are unremarkable. Lungs/Pleura: Bilateral interstitial thickening with intervening ground-glass opacities, with a lower lung zone predominance. More confluent type opacity is seen adjacent to the bronchi to the lower lobes. No lung mass or suspicious nodule. No pleural effusion or pneumothorax. Interstitial and ground-glass opacities were present on the prior CT, but there has been significant progression. Musculoskeletal: No fracture or acute finding. No bone lesion. No chest wall masses. Review of the MIP images confirms the above findings. CT ABDOMEN and PELVIS FINDINGS Hepatobiliary: No focal liver abnormality is seen. Status post cholecystectomy. No biliary dilatation. Pancreas: Unremarkable. No pancreatic ductal dilatation or surrounding inflammatory changes. Spleen: Spleen normal in size and attenuation. 9 mm low-attenuation lesion along the inferior margin of the spleen, consistent with a cyst. No other splenic lesions. Adrenals/Urinary Tract: No adrenal masses. Kidneys normal in size, orientation and position. 5 mm low-attenuation lesion, midpole the right kidney. There is a similar appearing smaller lesion from the posterior upper pole of the left kidney. These are too small to fully characterize but are consistent with cysts. No other renal masses or lesions, no stones and no hydronephrosis. Normal ureters. Normal bladder. Stomach/Bowel: Small hiatal hernia. Stomach otherwise unremarkable. Normal small bowel. Multiple colonic diverticula. No diverticulitis or other colonic inflammatory process. No evidence of appendicitis. Vascular/Lymphatic: Mild aortic atherosclerosis. No aneurysm. Vessels otherwise unremarkable. No enlarged lymph nodes. Reproductive: Contour bulge along the anterior aspect of the upper uterine segment consistent with a 1.2 cm subserosal fibroid. Uterus otherwise unremarkable. No adnexal masses. Other: No abdominal wall hernia or abnormality. No abdominopelvic ascites.  Musculoskeletal: No acute or significant osseous findings. Review of the MIP images confirms the above findings. IMPRESSION: CTA CHEST 1. No evidence of a pulmonary embolism. 2. Lungs demonstrate heterogeneous areas of interstitial thickening with intervening ground-glass opacities, more prominent in the lower lungs, which have progressed compared to the prior chest CT from 03/27/2020. Findings are consistent with progressive interstitial lung disease. Superimposed infection is not excluded. No convincing pulmonary edema. 3. Mild, reactive bilateral hilar and mediastinal adenopathy. 4. Minimal aortic atherosclerosis. CT ABDOMEN AND PELVIS 1. No acute findings within the abdomen or pelvis. 2. Mild aortic atherosclerosis. Electronically Signed   By: Lajean Manes M.D.   On: 07/07/2021 12:29   CT ABDOMEN PELVIS W CONTRAST  Result Date: 07/07/2021 CLINICAL DATA:  Decreased oxygen saturation. Clinical concern for pulmonary embolism. Patient also with right flank and upper quadrant pain. History of a cholecystectomy. EXAM: CT ANGIOGRAPHY CHEST CT ABDOMEN AND PELVIS WITH CONTRAST TECHNIQUE: Multidetector CT imaging of the chest was performed using the standard protocol during bolus administration of intravenous contrast. Multiplanar CT image reconstructions and MIPs were obtained to evaluate the vascular anatomy. Multidetector CT imaging of the abdomen and pelvis was performed using the standard protocol during bolus administration of intravenous contrast. RADIATION DOSE REDUCTION: This exam was performed according to the departmental dose-optimization program which includes automated exposure control, adjustment of the mA and/or kV according to patient size and/or  use of iterative reconstruction technique. CONTRAST:  60m OMNIPAQUE IOHEXOL 350 MG/ML SOLN COMPARISON:  CT a chest, 03/27/2020.  CT abdomen pelvis, 05/11/2010. FINDINGS: CTA CHEST FINDINGS Cardiovascular: Pulmonary arteries are well opacified. There is no  evidence of a pulmonary embolism. Heart is top-normal in size. No pericardial effusion. Great vessels are normal in caliber. Aorta is minimally opacified. No convincing dissection. Minimal atherosclerosis. Mediastinum/Nodes: Normal thyroid. No neck base, mediastinal or hilar masses. There are prominent, shoddy mediastinal lymph nodes, subcentimeter. There are prominent to borderline enlarged bilateral hilar lymph nodes, largest along the inferior left hilum, 1.1 cm in short axis. Trachea and esophagus are unremarkable. Lungs/Pleura: Bilateral interstitial thickening with intervening ground-glass opacities, with a lower lung zone predominance. More confluent type opacity is seen adjacent to the bronchi to the lower lobes. No lung mass or suspicious nodule. No pleural effusion or pneumothorax. Interstitial and ground-glass opacities were present on the prior CT, but there has been significant progression. Musculoskeletal: No fracture or acute finding. No bone lesion. No chest wall masses. Review of the MIP images confirms the above findings. CT ABDOMEN and PELVIS FINDINGS Hepatobiliary: No focal liver abnormality is seen. Status post cholecystectomy. No biliary dilatation. Pancreas: Unremarkable. No pancreatic ductal dilatation or surrounding inflammatory changes. Spleen: Spleen normal in size and attenuation. 9 mm low-attenuation lesion along the inferior margin of the spleen, consistent with a cyst. No other splenic lesions. Adrenals/Urinary Tract: No adrenal masses. Kidneys normal in size, orientation and position. 5 mm low-attenuation lesion, midpole the right kidney. There is a similar appearing smaller lesion from the posterior upper pole of the left kidney. These are too small to fully characterize but are consistent with cysts. No other renal masses or lesions, no stones and no hydronephrosis. Normal ureters. Normal bladder. Stomach/Bowel: Small hiatal hernia. Stomach otherwise unremarkable. Normal small  bowel. Multiple colonic diverticula. No diverticulitis or other colonic inflammatory process. No evidence of appendicitis. Vascular/Lymphatic: Mild aortic atherosclerosis. No aneurysm. Vessels otherwise unremarkable. No enlarged lymph nodes. Reproductive: Contour bulge along the anterior aspect of the upper uterine segment consistent with a 1.2 cm subserosal fibroid. Uterus otherwise unremarkable. No adnexal masses. Other: No abdominal wall hernia or abnormality. No abdominopelvic ascites. Musculoskeletal: No acute or significant osseous findings. Review of the MIP images confirms the above findings. IMPRESSION: CTA CHEST 1. No evidence of a pulmonary embolism. 2. Lungs demonstrate heterogeneous areas of interstitial thickening with intervening ground-glass opacities, more prominent in the lower lungs, which have progressed compared to the prior chest CT from 03/27/2020. Findings are consistent with progressive interstitial lung disease. Superimposed infection is not excluded. No convincing pulmonary edema. 3. Mild, reactive bilateral hilar and mediastinal adenopathy. 4. Minimal aortic atherosclerosis. CT ABDOMEN AND PELVIS 1. No acute findings within the abdomen or pelvis. 2. Mild aortic atherosclerosis. Electronically Signed   By: DLajean ManesM.D.   On: 07/07/2021 12:29    Assessment/Plan:   Principal Problem:   Acute on chronic respiratory failure with hypoxia (HCC) Active Problems:   Chronic cough   NSIP (nonspecific interstitial pneumonia) (HCC)   GERD (gastroesophageal reflux disease)   Patient Summary: Olivia LUKESis a 56y.o.with a past medical hx of NSIP, GERD, nephrolithiasis, and  gallstones S/p cholecystectomy, here today for cough and right sided flank pain likely 2/2 to worsening NSIP.    Principal Problem:   Acute on chronic respiratory failure with hypoxia (HCC) Active Problems:   Chronic cough   NSIP (nonspecific interstitial pneumonia) (HCC)   GERD (gastroesophageal  reflux disease)   Acute on chronic respiratory failure with hypoxia Chronic cough Right-sided flank pain Nonspecific Interstitial Pneumonia Patient's lab work have remained normal, no leukocytosis, afebrile. Suspect patient's current presentation related to lack of steroids and home oxygen. Do not think patient has an infection. After symptomatic treatment patient is feeling much better. Will work on trying obtain oxygen for home. - COVID booster today - s/p solumedrol, 7 days of pred 60 then taper - f/u blood cultures -albuterol nebs 2.'5mg'$  q6h -prn Robitussin for chronic cough - d/c antibiotics -supplemental O2 prn to maintain O2 sats >90% -schedule tylenol q8, toradol q6 prn for moderate pain, oxycodone q4 prn for severe pain -ICS   GERD GERD has been stable on PPI previously, burning chest pain described on exam likely associated with chronic cough. Will start PPI. -pantoprazole '40mg'$  daily   Hx of depression Not on currently on medications, mood is stable today during exam. -CTM mental status   Hyperlipidemia; Mild aortic atherosclerosis Not on currently on medications. May benefit from starting a statin as an outpatient.   Fluids: none VTE: Lovenox '40mg'$  daily Code: Full  Scarlett Presto, MD Internal Medicine Resident PGY-1 Pager [phone] Please contact the on call pager after 5 pm and on weekends at [phone].

## 2021-07-08 NOTE — TOC Transition Note (Signed)
Transition of Care (TOC) - CM/SW Discharge Note ? ? ?Patient Details  ?Name: Olivia [name] ?MRN: [id number] ?Date of Birth: 13-May-1965 ? ?Transition of Care [facility]) CM/SW Contact:  ?Edna Grover, Nonda Lou, RN ?Phone Number: ?07/08/2021, 3:48 PM ? ? ?Clinical Narrative:  TOC team for discharge planning. Spoke to patient and spouse at bedside. Her primary language is Spanish (needs an interpreter). Pt. Shares she is uninsured. She has a need home oxygen. O2 ordered from [employer]. Explained the therapy is only no charge for 30 days. After the 30 days she will be billed as self pay. Discussed applying for [place] Medicaid. Provided resource for applying. Explained the ability apply in-person or over the phone. Pt expressed understanding. Will continue to monitor for and further discharge needs. ? ? ? ?Final next level of care: Home/Self Care ?Barriers to Discharge: Undocumented, Pensions consultant, Inadequate or no insurance ? ? ?Patient Goals and CMS Choice ?Patient states their goals for this hospitalization and ongoing recovery are:: Return home with spouse. ?  ?  ? ?Discharge Placement ?  ?           ?  ?  ?  ?  ? ?Discharge Plan and Services ?  ?Discharge Planning Services: CM Consult ?           ?DME Arranged: Oxygen ?DME Agency: [employer] ?Date DME Agency Contacted: 07/08/21 ?Time DME Agency Contacted: 1100 ?Representative spoke with at DME Agency: Delana Meyer- [phone] ?  ?  ?  ?  ?  ? ?Social Determinants of Health (SDOH) Interventions ?Financial Strain Interventions: Other (Comment) (Provided resource for how to apply for [place] Medicaid.) ? ? ?Readmission Risk Interventions ?No flowsheet data found. ? ? ? ? ?

## 2021-07-09 ENCOUNTER — Other Ambulatory Visit (HOSPITAL_COMMUNITY): Payer: Self-pay

## 2021-07-09 ENCOUNTER — Observation Stay (HOSPITAL_COMMUNITY): Payer: No Typology Code available for payment source

## 2021-07-09 ENCOUNTER — Telehealth: Payer: Self-pay | Admitting: Internal Medicine

## 2021-07-09 LAB — CBC
HCT: 37.7 % (ref 36.0–46.0)
Hemoglobin: 12.4 g/dL (ref 12.0–15.0)
MCH: 30.3 pg (ref 26.0–34.0)
MCHC: 32.9 g/dL (ref 30.0–36.0)
MCV: 92.2 fL (ref 80.0–100.0)
Platelets: 242 10*3/uL (ref 150–400)
RBC: 4.09 MIL/uL (ref 3.87–5.11)
RDW: 11.7 % (ref 11.5–15.5)
WBC: 9.9 10*3/uL (ref 4.0–10.5)
nRBC: 0 % (ref 0.0–0.2)

## 2021-07-09 MED ORDER — GUAIFENESIN-CODEINE 100-10 MG/5ML PO SOLN
5.0000 mL | Freq: Four times a day (QID) | ORAL | 0 refills | Status: DC
  Filled 2021-07-09: qty 120, 6d supply, fill #0

## 2021-07-09 MED ORDER — KETOROLAC TROMETHAMINE 15 MG/ML IJ SOLN
15.0000 mg | Freq: Once | INTRAMUSCULAR | Status: AC
  Administered 2021-07-09: 15 mg via INTRAVENOUS
  Filled 2021-07-09: qty 1

## 2021-07-09 MED ORDER — SENNOSIDES-DOCUSATE SODIUM 8.6-50 MG PO TABS
1.0000 | ORAL_TABLET | Freq: Two times a day (BID) | ORAL | Status: DC
  Administered 2021-07-09: 1 via ORAL
  Filled 2021-07-09: qty 1

## 2021-07-09 MED ORDER — BACLOFEN 10 MG PO TABS: 5.0000 mg | ORAL_TABLET | Freq: Three times a day (TID) | ORAL | Status: DC | PRN

## 2021-07-09 MED ORDER — PREDNISONE 20 MG PO TABS
ORAL_TABLET | ORAL | 0 refills | Status: AC
  Filled 2021-07-09: qty 74, 48d supply, fill #0

## 2021-07-09 MED ORDER — BACLOFEN 5 MG PO TABS
1.0000 | ORAL_TABLET | Freq: Three times a day (TID) | ORAL | 0 refills | Status: DC | PRN
Start: 2021-07-09 — End: 2021-12-27
  Filled 2021-07-09: qty 30, 10d supply, fill #0

## 2021-07-09 MED ORDER — LIDOCAINE 5 % EX PTCH
1.0000 | MEDICATED_PATCH | CUTANEOUS | Status: DC
  Administered 2021-07-09: 1 via TRANSDERMAL
  Filled 2021-07-09: qty 1

## 2021-07-09 MED ORDER — GUAIFENESIN-CODEINE 100-10 MG/5ML PO SOLN
5.0000 mL | Freq: Four times a day (QID) | ORAL | Status: DC
  Administered 2021-07-09: 5 mL via ORAL
  Filled 2021-07-09: qty 5

## 2021-07-09 MED ORDER — IPRATROPIUM-ALBUTEROL 0.5-2.5 (3) MG/3ML IN SOLN
3.0000 mL | Freq: Two times a day (BID) | RESPIRATORY_TRACT | Status: DC
  Administered 2021-07-09: 3 mL via RESPIRATORY_TRACT
  Filled 2021-07-09: qty 3

## 2021-07-09 NOTE — Discharge Summary (Cosign Needed)
Name: Olivia [name] MRN: [id number] DOB: June 02, 1965 56 y.o. PCP: Delene Ruffini, MD  Date of Admission: 07/07/2021  9:31 AM Date of Discharge:   07/09/21 Attending Physician: Dr. Dorian Pod  Discharge Diagnosis: 1. Acute on chronic respiratory failure with hypoxia; Chronic cough; Right-sided flank pain; Nonspecific Interstitial Pneumonia 2. GERD 3. Hx of depression 4. Hyperlipidemia; Mild aortic atherosclerosis  Discharge Medications: Allergies as of 07/09/2021       Reactions   Miralax [polyethylene Glycol] Rash        Medication List     TAKE these medications    albuterol 108 (90 Base) MCG/ACT inhaler Commonly known as: Ventolin HFA Inhale 2 puffs into the lungs every 6 (six) hours as needed for wheezing or shortness of breath.   Baclofen 5 MG Tabs Take 5 mg by mouth 3 (three) times daily as needed for muscle spasms.   gabapentin 100 MG capsule Commonly known as: NEURONTIN One three times daily What changed:  how much to take how to take this when to take this   guaiFENesin-codeine 100-10 MG/5ML syrup Take 5 mLs by mouth every 6 (six) hours.   omeprazole 20 MG capsule Commonly known as: PRILOSEC Take 30- 60 min before your first and last meals of the day What changed:  how much to take how to take this when to take this additional instructions   predniSONE 20 MG tablet Commonly known as: DELTASONE Take 3 tablets (60 mg total) by mouth daily with breakfast for 6 days, THEN 2.5 tablets (50 mg total) daily with breakfast for 7 days, THEN 2 tablets (40 mg total) daily with breakfast for 7 days, THEN 1.5 tablets (30 mg total) daily with breakfast for 7 days, THEN 1 tablet (20 mg total) daily with breakfast for 7 days, THEN 0.5 tablets (10 mg total) daily with breakfast for 14 days. Start taking on: July 10, 2021 What changed:  medication strength See the new instructions.               Durable Medical Equipment  (From admission,  onward)           Start     Ordered   07/09/21 1346  For home use only DME oxygen  Once       Question Answer Comment  Length of Need 6 Months   Liters per Minute 2   Frequency Continuous (stationary and portable oxygen unit needed)   Oxygen delivery system Gas      07/09/21 1406            Disposition and follow-up:   Olivia [name] was discharged from [facility] in Stable condition.  At the hospital follow up visit please address:  1.  Acute on chronic hypoxic resp failure- patient has 30 day supply of home oxygen only. Please work on finding out whether patient qualifies for any assistance. Refill cough syrup, lidocaine patches  2. Constipation- assess if pt has had a bowel movement  3. HLD- consider starting statin  4. Depression- consider referral to Dr. Theodis Shove  2.  Labs / imaging needed at time of follow-up: none  3.  Pending labs/ test needing follow-up: none  Follow-up Appointments:  Follow-up Information     [facility] Follow up.   Why: home oxygen provided by [facility]: questions please call [phone] Contact information: [address] [place] [address] [phone]  Hospital Course by problem list: 1. Acute on chronic respiratory failure with hypoxia Chronic cough Right-sided flank pain Nonspecific Interstitial Pneumonia Patient with history of Interstitial lung disease first diagnosed in 2020 as nonspecific interstitial pneumonia. Lung CT consistent with progressive interstitial lung disease showing heterogeneous areas of interstitial thickening with intervening ground-glass opacities, which have progressed compared to the prior chest CT from 11/21. Initially there was concern for possible superimposed infection however patient remained afebrile with no leukocytosis. She received one dose of azithromycin and ceftriaxone in the ED which were then D/c. She also  received a dose of solumdrol and then was put on predisone 60 mg for one week which will then be tapered slowly. She had a lot of flank pain however XR was negative for rib fracture. Suspect this pain is related to costochondritis as there is no point tenderness but she is diffusely tender with tight muscles to palpation along her flank. She was given a dose of toradol, lidocaine patch, cough medicine with codeine, and a muscle relaxer to decrease the pain in addition to scheduled tylenol. While in the hospital she was also given a COVID booster. For her shortness of breath she was given albuterol nebs Q6 as well as supplemental oxygen. She has previously used home oxygen however since she is uninsured she was unable to afford the cost of this out of pocket. She will qualify for 30 days of charity home oxygen upon discharge, however after that she will be charged. She will need to continue following with the [facility] to see if there will be any way to provide assistance to help her be able to afford oxygen.  GERD Patient described burning chest pain. Started on PPI 40 of pantoprazole daily.   Hx of depression Not on currently on medications, mood is stable today during exam and no interventions were taken.   Hyperlipidemia; Mild aortic atherosclerosis Not on currently on medications. May benefit from starting a statin as an outpatient.  Constipation Allergic to miralax. Given Senokot 1 BID.   Discharge Exam:   BP 112/83 (BP Location: Left Arm)    Pulse 71    Temp 98.6 F (37 C) (Oral)    Resp 18    Ht '4\' 9"'$  (1.448 m)    Wt 73.9 kg    LMP  (LMP Unknown)    SpO2 95%    BMI 35.27 kg/m  Constitutional: Middle aged woman resting comfortably in bed, in no acute distress HEENT: normocephalic atraumatic, mucous membranes moist, conjunctiva non-erythematous Cardiovascular: regular rate and rhythm, no m/r/g, no LEE Pulmonary/Chest: normal work of breathing on 2L, diffuse crackles and rales throughout. Coarse  breath sounds, Cough triggered with deep inspiration Abdominal: soft, non-tender, non-distended MSK: tenderness to palpation along the right flank Neurological: alert & oriented x 3, answering questions appropriately Skin: warm and dry Psych: normal affect  Pertinent Labs, Studies, and Procedures:   CT Angio Chest PE W and/or Wo Contrast  Result Date: 07/07/2021 CLINICAL DATA:  Decreased oxygen saturation. Clinical concern for pulmonary embolism. Patient also with right flank and upper quadrant pain. History of a cholecystectomy. EXAM: CT ANGIOGRAPHY CHEST CT ABDOMEN AND PELVIS WITH CONTRAST TECHNIQUE: Multidetector CT imaging of the chest was performed using the standard protocol during bolus administration of intravenous contrast. Multiplanar CT image reconstructions and MIPs were obtained to evaluate the vascular anatomy. Multidetector CT imaging of the abdomen and pelvis was performed using the standard protocol during bolus administration of intravenous contrast. RADIATION DOSE REDUCTION: This exam  was performed according to the departmental dose-optimization program which includes automated exposure control, adjustment of the mA and/or kV according to patient size and/or use of iterative reconstruction technique. CONTRAST:  15m OMNIPAQUE IOHEXOL 350 MG/ML SOLN COMPARISON:  CT a chest, 03/27/2020.  CT abdomen pelvis, 05/11/2010. FINDINGS: CTA CHEST FINDINGS Cardiovascular: Pulmonary arteries are well opacified. There is no evidence of a pulmonary embolism. Heart is top-normal in size. No pericardial effusion. Great vessels are normal in caliber. Aorta is minimally opacified. No convincing dissection. Minimal atherosclerosis. Mediastinum/Nodes: Normal thyroid. No neck base, mediastinal or hilar masses. There are prominent, shoddy mediastinal lymph nodes, subcentimeter. There are prominent to borderline enlarged bilateral hilar lymph nodes, largest along the inferior left hilum, 1.1 cm in short axis.  Trachea and esophagus are unremarkable. Lungs/Pleura: Bilateral interstitial thickening with intervening ground-glass opacities, with a lower lung zone predominance. More confluent type opacity is seen adjacent to the bronchi to the lower lobes. No lung mass or suspicious nodule. No pleural effusion or pneumothorax. Interstitial and ground-glass opacities were present on the prior CT, but there has been significant progression. Musculoskeletal: No fracture or acute finding. No bone lesion. No chest wall masses. Review of the MIP images confirms the above findings. CT ABDOMEN and PELVIS FINDINGS Hepatobiliary: No focal liver abnormality is seen. Status post cholecystectomy. No biliary dilatation. Pancreas: Unremarkable. No pancreatic ductal dilatation or surrounding inflammatory changes. Spleen: Spleen normal in size and attenuation. 9 mm low-attenuation lesion along the inferior margin of the spleen, consistent with a cyst. No other splenic lesions. Adrenals/Urinary Tract: No adrenal masses. Kidneys normal in size, orientation and position. 5 mm low-attenuation lesion, midpole the right kidney. There is a similar appearing smaller lesion from the posterior upper pole of the left kidney. These are too small to fully characterize but are consistent with cysts. No other renal masses or lesions, no stones and no hydronephrosis. Normal ureters. Normal bladder. Stomach/Bowel: Small hiatal hernia. Stomach otherwise unremarkable. Normal small bowel. Multiple colonic diverticula. No diverticulitis or other colonic inflammatory process. No evidence of appendicitis. Vascular/Lymphatic: Mild aortic atherosclerosis. No aneurysm. Vessels otherwise unremarkable. No enlarged lymph nodes. Reproductive: Contour bulge along the anterior aspect of the upper uterine segment consistent with a 1.2 cm subserosal fibroid. Uterus otherwise unremarkable. No adnexal masses. Other: No abdominal wall hernia or abnormality. No abdominopelvic  ascites. Musculoskeletal: No acute or significant osseous findings. Review of the MIP images confirms the above findings. IMPRESSION: CTA CHEST 1. No evidence of a pulmonary embolism. 2. Lungs demonstrate heterogeneous areas of interstitial thickening with intervening ground-glass opacities, more prominent in the lower lungs, which have progressed compared to the prior chest CT from 03/27/2020. Findings are consistent with progressive interstitial lung disease. Superimposed infection is not excluded. No convincing pulmonary edema. 3. Mild, reactive bilateral hilar and mediastinal adenopathy. 4. Minimal aortic atherosclerosis. CT ABDOMEN AND PELVIS 1. No acute findings within the abdomen or pelvis. 2. Mild aortic atherosclerosis. Electronically Signed   By: DLajean ManesM.D.   On: 07/07/2021 12:29   CT ABDOMEN PELVIS W CONTRAST  Result Date: 07/07/2021 CLINICAL DATA:  Decreased oxygen saturation. Clinical concern for pulmonary embolism. Patient also with right flank and upper quadrant pain. History of a cholecystectomy. EXAM: CT ANGIOGRAPHY CHEST CT ABDOMEN AND PELVIS WITH CONTRAST TECHNIQUE: Multidetector CT imaging of the chest was performed using the standard protocol during bolus administration of intravenous contrast. Multiplanar CT image reconstructions and MIPs were obtained to evaluate the vascular anatomy. Multidetector CT imaging of the abdomen and  pelvis was performed using the standard protocol during bolus administration of intravenous contrast. RADIATION DOSE REDUCTION: This exam was performed according to the departmental dose-optimization program which includes automated exposure control, adjustment of the mA and/or kV according to patient size and/or use of iterative reconstruction technique. CONTRAST:  22m OMNIPAQUE IOHEXOL 350 MG/ML SOLN COMPARISON:  CT a chest, 03/27/2020.  CT abdomen pelvis, 05/11/2010. FINDINGS: CTA CHEST FINDINGS Cardiovascular: Pulmonary arteries are well opacified. There  is no evidence of a pulmonary embolism. Heart is top-normal in size. No pericardial effusion. Great vessels are normal in caliber. Aorta is minimally opacified. No convincing dissection. Minimal atherosclerosis. Mediastinum/Nodes: Normal thyroid. No neck base, mediastinal or hilar masses. There are prominent, shoddy mediastinal lymph nodes, subcentimeter. There are prominent to borderline enlarged bilateral hilar lymph nodes, largest along the inferior left hilum, 1.1 cm in short axis. Trachea and esophagus are unremarkable. Lungs/Pleura: Bilateral interstitial thickening with intervening ground-glass opacities, with a lower lung zone predominance. More confluent type opacity is seen adjacent to the bronchi to the lower lobes. No lung mass or suspicious nodule. No pleural effusion or pneumothorax. Interstitial and ground-glass opacities were present on the prior CT, but there has been significant progression. Musculoskeletal: No fracture or acute finding. No bone lesion. No chest wall masses. Review of the MIP images confirms the above findings. CT ABDOMEN and PELVIS FINDINGS Hepatobiliary: No focal liver abnormality is seen. Status post cholecystectomy. No biliary dilatation. Pancreas: Unremarkable. No pancreatic ductal dilatation or surrounding inflammatory changes. Spleen: Spleen normal in size and attenuation. 9 mm low-attenuation lesion along the inferior margin of the spleen, consistent with a cyst. No other splenic lesions. Adrenals/Urinary Tract: No adrenal masses. Kidneys normal in size, orientation and position. 5 mm low-attenuation lesion, midpole the right kidney. There is a similar appearing smaller lesion from the posterior upper pole of the left kidney. These are too small to fully characterize but are consistent with cysts. No other renal masses or lesions, no stones and no hydronephrosis. Normal ureters. Normal bladder. Stomach/Bowel: Small hiatal hernia. Stomach otherwise unremarkable. Normal small  bowel. Multiple colonic diverticula. No diverticulitis or other colonic inflammatory process. No evidence of appendicitis. Vascular/Lymphatic: Mild aortic atherosclerosis. No aneurysm. Vessels otherwise unremarkable. No enlarged lymph nodes. Reproductive: Contour bulge along the anterior aspect of the upper uterine segment consistent with a 1.2 cm subserosal fibroid. Uterus otherwise unremarkable. No adnexal masses. Other: No abdominal wall hernia or abnormality. No abdominopelvic ascites. Musculoskeletal: No acute or significant osseous findings. Review of the MIP images confirms the above findings. IMPRESSION: CTA CHEST 1. No evidence of a pulmonary embolism. 2. Lungs demonstrate heterogeneous areas of interstitial thickening with intervening ground-glass opacities, more prominent in the lower lungs, which have progressed compared to the prior chest CT from 03/27/2020. Findings are consistent with progressive interstitial lung disease. Superimposed infection is not excluded. No convincing pulmonary edema. 3. Mild, reactive bilateral hilar and mediastinal adenopathy. 4. Minimal aortic atherosclerosis. CT ABDOMEN AND PELVIS 1. No acute findings within the abdomen or pelvis. 2. Mild aortic atherosclerosis. Electronically Signed   By: DLajean ManesM.D.   On: 07/07/2021 12:29   DG Chest Port 1 View  Result Date: 07/07/2021 CLINICAL DATA:  56year old female with cough, right side pain. EXAM: PORTABLE CHEST 1 VIEW COMPARISON:  CTA chest 03/27/2020 and earlier. FINDINGS: Portable AP semi upright view at 1015 hours. Low lung volumes, mildly decreased since 2021. Cardiac size is stable at the upper limits of normal. Other mediastinal contours are within normal limits.  Visualized tracheal air column is within normal limits. Chronic pulmonary interstitial changes, also described on 07/09/2019 high-resolution chest CT. No pneumothorax, pleural effusion or definite acute Pulmonary could opacity compared to priors. Negative  visible bowel gas. Stable cholecystectomy clips. No acute osseous abnormality identified. IMPRESSION: Chronic pulmonary interstitial disease and low lung volumes. No acute cardiopulmonary abnormality. Electronically Signed   By: Genevie Ann M.D.   On: 07/07/2021 10:23    CBC Latest Ref Rng & Units 07/09/2021 07/08/2021 07/07/2021  WBC 4.0 - 10.5 K/uL 9.9 4.4 6.9  Hemoglobin 12.0 - 15.0 g/dL 12.4 13.5 14.8  Hematocrit 36.0 - 46.0 % 37.7 39.9 44.4  Platelets 150 - 400 K/uL 242 241 234    BMP Latest Ref Rng & Units 07/08/2021 07/07/2021 05/30/2021  Glucose 70 - 99 mg/dL 176(H) 120(H) 83  BUN 6 - 20 mg/dL '7 7 12  '$ Creatinine 0.44 - 1.00 mg/dL 0.49 0.60 0.61  BUN/Creat Ratio 9 - 23 - - 20  Sodium 135 - 145 mmol/L 136 139 138  Potassium 3.5 - 5.1 mmol/L 3.9 4.0 4.2  Chloride 98 - 111 mmol/L 104 107 102  CO2 22 - 32 mmol/L '22 23 20  '$ Calcium 8.9 - 10.3 mg/dL 9.1 9.4 9.6    Discharge Instructions: Discharge Instructions     Call MD for:  difficulty breathing, headache or visual disturbances   Complete by: As directed    Call MD for:  persistant dizziness or light-headedness   Complete by: As directed    Call MD for:  severe uncontrolled pain   Complete by: As directed    Call MD for:  temperature >100.4   Complete by: As directed    Diet - low sodium heart healthy   Complete by: As directed    Increase activity slowly   Complete by: As directed        Signed: Scarlett Presto, MD 07/09/2021, 2:08 PM   Pager: [phone]

## 2021-07-09 NOTE — Telephone Encounter (Signed)
[facility] New HFU ? ?Name: Olivia, [name] MRN: [id number]  ?Date: 07/19/2021 Status: Sch  ?Time: 1:45 PM Length: 60  ?Visit Type: OPEN ESTABLISHED [726] Copay: $0.00  ?Provider: Christiana Fuchs, DO    ? ? ? ? ?

## 2021-07-09 NOTE — Progress Notes (Incomplete)
Pt c/o 8-10 paged IMTS [phone], [phone] ?

## 2021-07-09 NOTE — Discharge Instructions (Addendum)
Olivia [name] ? ?You were recently admitted to [facility] for shortness of breath. We gave you medications to help with your pain, reduce the inflammation in your lungs, and oxygen to help you breath. We took an Xray of your chest and determined that you do not have a fractured rib. You likely have a pulled muscle from coughing. ? ?Continue taking your home medications with the following changes ? ?Start using lidocaine patches. Put these on your right side to help with the pain ?Take 5 mL of the cough syrup every 6 hours ?Take baclofen 5 mg three times a day as needed to help with your muscle pain in your side. ?Alternate taking ibuprofen 600 mg and '1000mg'$  of tylenol every 4-6 hours. You can take up to 3 g of tylenol a day. ?Continue taking prednisone. You will now take 60 mg for a total of 7 days, then decrease to 50 mg for 7 days, then 40 mg for 7 days, then 30 mg for 7 days, then 20 mg for 7 days, then continue taking 10 mg daily ? ? ?You should seek further medical care if you have worsening shortness of breath, fever, chills, lightheadedness, or dizziness. ? ?We recommend that you see your primary care doctor in about a week to make sure that you continue to improve. We are so glad that you are feeling better. ? ?Sincerely, ?Scarlett Presto, MD ? ? ? ? ?M?nica S?chez-Rojas ? ?Recientemente fue [facility]resada en [facility] por dificultad para respirar. Le dimos medicamentos para ayudarla con Psychiatric nurse, reducir la inflamaci?n en sus pulmones y ox?geno para ayudarla a Ambulance person. Tomamos una radiograf?a de su pecho y determinamos que no tiene una costilla fracturada. Es probable que tenga un tir?n muscular por toser. ? ?Contin?e tomando sus medicamentos en el hogar con los siguientes cambios ? ?1. Comience a usar parches de lidoca?na. Col?quelos en su lado derecho para ayudar con Conservation officer, historic buildings. ?2. Tome 5 mL del jarabe para la tos cada 6 horas ?3. Tome 5 mg de baclofeno tres veces al d?a seg?n sea  necesario para Stage manager en el costado. ?4. Alterne la toma de ibuprofeno 600 mg y 1000 mg de tylenol cada 4-6 horas. Puede tomar hasta 3 g de tylenol al d?a. ?5. Contin?e tomando prednisona. Ahora tomar? 60 mg por un total de 7 d?as, luego disminuir? a 50 mg por 7 d?as, luego a 40 mg por 7 d?as, luego a 30 mg por 7 d?as, luego a 20 mg por 7 d?as, luego contin?e tomando 10 mg al d?a. ? ? ?Debe buscar atenci?n m?dica adicional si tiene dificultad para respirar, fiebre, escalofr?os, aturdimiento o mareos. ? ?Le recomendamos que consulte a su m?dico de atenci?n primaria en aproximadamente una semana para asegurarse de que contin?a mejorando. Estamos muy contentos de que se sienta mejor. ? ?Atentamente, ?Domingue Coltrain Vita Barley, MD ?

## 2021-07-09 NOTE — Progress Notes (Signed)
Discharge instructions reviewed with pt and her husband, reviewed in Spanish by interpreter Graciela. ?Copy of instructions given to pt. Scripts were sent to [facility] to be filled and pt and husband were given instructions and address to get there.  ?Pt d/c'd via wheelchair with belongings, with husband, on portable O2 tank.            ?Escorted by  unit staff. ?

## 2021-07-09 NOTE — Progress Notes (Signed)
? ?Subjective: ? ?No acute events overnight. Patient comfortable on 2L nasal cannula, states she is not receiving SOB with ambulation or at rest on oxygen. Pain has improved but is still present. Pain localized over upper right abdomen and right mid back. Patient states she asked for pain medication yesterday afternoon but did not receive meds until that night, informed patient and nurse of prn meds. Patient states pain worse with coughing, cough medication has been helpful. Endorsing constipation today, not a chronic issue for patient. ? ?Objective: ? ?Vital signs in last 24 hours: ?Vitals:  ? 07/09/21 0513 07/09/21 0832 07/09/21 0843 07/09/21 1250  ?BP:  132/82  112/83  ?Pulse: 75 65  71  ?Resp: '18 18  18  '$ ?Temp: 97.7 ?F (36.5 ?C) 98.4 ?F (36.9 ?C)  98.6 ?F (37 ?C)  ?TempSrc: Oral Oral  Oral  ?SpO2:  100% 97% 95%  ?Weight:      ?Height:      ? ?Weight change:  ? ?Intake/Output Summary (Last 24 hours) at 07/09/2021 1258 ?Last data filed at 07/09/2021 0017 ?Gross per 24 hour  ?Intake 360 ml  ?Output --  ?Net 360 ml  ? ?Physical Exam: ?Constitutional: Middle aged women sitting up in bed, alert and talkative, in no acute distress ?HEENT: moist mucus membranes ?Pulm: Normal work of breathing on 2L O2, Inspiratory crackles/rales present and coarse breath sounds throughout ?Cardio: normal rate and rhythm ?Abd: Soft, non-distended, + normal bowel sounds, generalized pain to palpation over upper right abdomen and right mid back, no point tenderness ?Extremities: Moving all extremities spontaneously, clubbing present on bilateral fingers ?Skin: warm and dry, no appreciable rashes throughout ?Neuro:Alert and oriented x3 ?Psych: Pleasant affect ? ? ?Assessment/Plan: ? ?Patient Summary: ?Olivia [name] is a 56 y.o.with a past medical hx of NSIP, GERD, nephrolithiasis, and gallstones S/p cholecystectomy, here today for cough and right sided flank pain likely 2/2 to worsening NSIP.  ? ?Principal Problem: ?  Acute on chronic  respiratory failure with hypoxia ([place]) ?Active Problems: ?  Chronic cough ?  NSIP (nonspecific interstitial pneumonia) ([place]) ?  GERD (gastroesophageal reflux disease) ? ?Acute on chronic respiratory failure with hypoxia ?Chronic cough ?Right-sided flank pain ?Nonspecific Interstitial Pneumonia ?Patient's lab work have remained normal, no leukocytosis, afebrile. Patient feeling better with symptomatic treatment of cough and with oxygen supplementation. Continues to endorse generalized abdominal pain to palpation over upper right abdomen and right mid back, no point tenderness. Right rib x-ray showed no displaced fracture of the right ribs. Pain generalized over right side, pain likely muscle strain 2/2 to chronic cough. Some improvement on robitussin cough syrup, will add guaifenesin-codeine cough syrup,lidocaine patch, and baclofen prn. SW working to obtain home oxygen. ?- s/p solumedrol, 7 days of pred 60 then taper ?- f/u blood cultures ?-albuterol nebs 2.'5mg'$  q6h ?-prn Robitussin for chronic cough ?-guaifenesin-codeine cough syrup 53m BID ?-baclofen, lidocaine patch prn ?-supplemental O2 prn to maintain O2 sats >90% ?-schedule tylenol q8, toradol q6 prn for moderate pain, oxycodone q4 prn for severe pain ?-ICS ?  ?GERD ?GERD has been stable on PPI previously, burning chest pain described on exam likely associated with chronic cough, on PPI. ?-pantoprazole '40mg'$  daily ?  ?Constipation ?Patient endorsing constipation for since admission, not a chronic issue for her. ?-Senna-docusate  ? ?Hx of depression ?Not on currently on medications, mood is stable today during exam. ?-CTM mental status ?  ?Hyperlipidemia; Mild aortic atherosclerosis ?Not on currently on medications. May benefit from starting a statin as an  outpatient. ?  ?Fluids: none ?VTE: Lovenox '40mg'$  daily ?Code: Full ? ? LOS: 0 days  ? ?Rodriguez-Teodoro, Quintella Reichert, Medical Student ?07/09/2021, 12:58 PM  ?

## 2021-07-09 NOTE — Progress Notes (Signed)
SATURATION QUALIFICATIONS: (This note is used to comply with regulatory documentation for home oxygen) ? ?Patient Saturations on Room Air at Rest = 91% ? ?Patient Saturations on Room Air while Ambulating = 87% ? ?Patient Saturations on 1 Liters of oxygen while Ambulating = 91-93% ? ? ?

## 2021-07-09 NOTE — TOC Progression Note (Signed)
Transition of Care (TOC) - Progression Note  ? ? ?Patient Details  ?Name: Olivia [name] ?MRN: [id number] ?Date of Birth: 1965/12/08 ? ?Transition of Care (TOC) CM/SW Contact  ?Joanne Chars, LCSW ?Phone Number: ?07/09/2021, 1:22 PM ? ?Clinical Narrative:   CSW confirmed with Freda Munro at [facility] that they are aware of charity home O2 need.  Pt scheduled for DC today. Still need saturation note and order. ? ? ? ?Expected Discharge Plan: Home/Self Care ?Barriers to Discharge: Undocumented, Pensions consultant, Inadequate or no insurance ? ?Expected Discharge Plan and Services ?Expected Discharge Plan: Home/Self Care ?  ?Discharge Planning Services: CM Consult ?  ?Living arrangements for the past 2 months: [place] ?                ?DME Arranged: Oxygen ?DME Agency: [employer] ?Date DME Agency Contacted: 07/08/21 ?Time DME Agency Contacted: 1100 ?Representative spoke with at DME Agency: Delana Meyer- [phone] ?  ?  ?  ?  ?  ? ? ?Social Determinants of Health (SDOH) Interventions ?Financial Strain Interventions: Other (Comment) (Provided resource for how to apply for [place] Medicaid.) ? ?Readmission Risk Interventions ?No flowsheet data found. ? ?

## 2021-07-09 NOTE — Progress Notes (Signed)
Mobility Specialist Progress Note  ? ? 07/09/21 1402  ?Mobility  ?Activity Ambulated independently in hallway  ?Level of Assistance Standby assist, set-up cues, supervision of patient - no hands on  ?Assistive Device None  ?Distance Ambulated (ft) 440 ft  ?Activity Response Tolerated fair  ?$Mobility charge 1 Mobility  ? ?Pre-Mobility: 91% SpO2 ?During Mobility: 87% SpO2 ?Post-Mobility: 96% on 2L SpO2 ? ?Pt received standing at bedside on RA and agreeable. SpO2 dropped to 87% on RA. Pt recovered to low 90s with 1LO2. Returned to room and connected [place] back to room O2 on 2L. RN notified. Left with call bell in reach.  ? ?Olivia [name] ?Mobility Specialist  ?M.S. 5N: [phone]  ?

## 2021-07-09 NOTE — TOC Transition Note (Signed)
Transition of Care (TOC) - CM/SW Discharge Note ? ? ?Patient Details  ?Name: Olivia [name] ?MRN: [id number] ?Date of Birth: [birth year] ? ?Transition of Care [facility]) CM/SW Contact:  ?Sharin Mons, RN ?Phone Number: ?07/09/2021, 2:05 PM ? ? ?Clinical Narrative:    ?Patient will DC to: home ?Anticipated DC date:07/09/2021 ?Family notified:yes ?Transport by: car ? ?Admitted with acute on chronic respiratory failure with hypoxia; Chronic cough ?Per MD patient ready for DC today. RN, patient, and patient's family aware of d/c. Pt from home with husband. Pt uninsured. Limited income. [facility] clinic to follow pt once d/c. Pt will obtain medications from  [facility]. MD states she can get them there cheaper as they are on the $4 list. ?Order noted for home oxygen. Referral made with [employer] for charity care home oxygen. Portable oxygen will be delivered to bedside prior to d/c once approval received. ?Family to provide transportation to home. ?[facility] clinic to arrange post hospital f/u and note on AVS. ? ?RNCM will sign off for now as intervention is no longer needed. Please consult [place] again if new needs arise.  ? ?Final next level of care: Home/Self Care ?Barriers to Discharge: No Barriers Identified ? ? ?Patient Goals and CMS Choice ?Patient states their goals for this hospitalization and ongoing recovery are:: Return home with spouse. ?  ?  ? ?Discharge Placement ?  ?           ?  ?  ?  ?  ? ?Discharge Plan and Services ?  ?Discharge Planning Services: CM Consult ?           ?DME Arranged: Oxygen ?DME Agency: [employer] ?Date DME Agency Contacted: 07/08/21 ?Time DME Agency Contacted: 1100 ?Representative spoke with at DME Agency: Delana Meyer- [phone] ?  ?  ?  ?  ?  ? ?Social Determinants of Health (SDOH) Interventions ?Financial Strain Interventions: Other (Comment) (Provided resource for how to apply for [place] Medicaid.) ? ? ?Readmission Risk Interventions ?No flowsheet data found. ? ? ? ? ?

## 2021-07-12 LAB — CULTURE, BLOOD (ROUTINE X 2)
Culture: NO GROWTH
Culture: NO GROWTH
Special Requests: ADEQUATE

## 2021-07-19 ENCOUNTER — Other Ambulatory Visit (HOSPITAL_COMMUNITY): Payer: Self-pay

## 2021-07-19 ENCOUNTER — Ambulatory Visit: Payer: Self-pay | Admitting: *Deleted

## 2021-07-19 ENCOUNTER — Ambulatory Visit: Payer: No Typology Code available for payment source

## 2021-07-19 ENCOUNTER — Ambulatory Visit (INDEPENDENT_AMBULATORY_CARE_PROVIDER_SITE_OTHER): Payer: Self-pay | Admitting: Internal Medicine

## 2021-07-19 ENCOUNTER — Ambulatory Visit
Admission: RE | Admit: 2021-07-19 | Discharge: 2021-07-19 | Disposition: A | Discharge status: Home / Self Care | Payer: No Typology Code available for payment source | Source: Ambulatory Visit | Attending: Obstetrics and Gynecology | Admitting: Obstetrics and Gynecology

## 2021-07-19 ENCOUNTER — Other Ambulatory Visit: Payer: Self-pay

## 2021-07-19 VITALS — BP 130/83 | HR 90 | Temp 98.1°F | Wt 166.3 lb

## 2021-07-19 VITALS — BP 122/88 | Wt 163.9 lb

## 2021-07-19 DIAGNOSIS — I739 Peripheral vascular disease, unspecified: Secondary | ICD-10-CM

## 2021-07-19 DIAGNOSIS — Z1211 Encounter for screening for malignant neoplasm of colon: Secondary | ICD-10-CM

## 2021-07-19 DIAGNOSIS — G589 Mononeuropathy, unspecified: Secondary | ICD-10-CM

## 2021-07-19 DIAGNOSIS — K219 Gastro-esophageal reflux disease without esophagitis: Secondary | ICD-10-CM

## 2021-07-19 DIAGNOSIS — J849 Interstitial pulmonary disease, unspecified: Secondary | ICD-10-CM

## 2021-07-19 DIAGNOSIS — J9621 Acute and chronic respiratory failure with hypoxia: Secondary | ICD-10-CM

## 2021-07-19 MED ORDER — ROSUVASTATIN CALCIUM 20 MG PO TABS
20.0000 mg | ORAL_TABLET | Freq: Every day | ORAL | 11 refills | Status: DC
  Filled 2021-07-19: qty 30, 30d supply, fill #0

## 2021-07-19 MED ORDER — PANTOPRAZOLE SODIUM 20 MG PO TBEC: 20.0000 mg | DELAYED_RELEASE_TABLET | Freq: Every day | ORAL | 1 refills | Status: DC

## 2021-07-19 MED ORDER — PANTOPRAZOLE SODIUM 20 MG PO TBEC
20.0000 mg | DELAYED_RELEASE_TABLET | Freq: Every day | ORAL | 2 refills | Status: DC
  Filled 2021-07-19: qty 90, 90d supply, fill #0
  Filled 2021-10-18: qty 90, 90d supply, fill #1
  Filled 2022-01-21: qty 90, 90d supply, fill #2

## 2021-07-19 NOTE — Patient Instructions (Signed)
Explained breast self awareness with Olivia [name]. Pap smear completed today. Let her know [facility] will cover Pap smears and HPV typing every 5 years unless has a history of abnormal Pap smears. Referred patient to the [facility] for a screening mammogram on mobile unit. Appointment scheduled Thursday, July 19, 2021 at 1130. Patient aware of appointment and will be there. Let patient know will follow up with her within the next couple weeks with results of Pap smear by phone. Informed patient that the [facility] will follow up with her within the next couple of weeks with results of her mammogram by letter or phone. Olivia [name] verbalized understanding. ? ?Lourene Hoston, Arvil Chaco, RN ?11:12 AM ? ? ? ? ?

## 2021-07-19 NOTE — Patient Instructions (Addendum)
Olivia [name], it was a pleasure seeing you today! ? ?Today we discussed: ?Lung disease ?Please continue taking your steroid medication as instructed from the hospital. Follow-up with Dr. Melvyn Novas on 08/10/21 at 4:30PM. I checked about your oxygen and you should have oxygen supplied to you. If you have difficulty with this call this clinic at [phone]. ? ?Belly pain ?The imaging you got in the hospital did not show anything wrong in your belly. Your symptoms sound like you have acid reflux. Please complete your orange card paperwork so that I can refer you to a GI doctor. ? ?Leg pain ?The test we did on your arteries showed low flow. This could explain why your leg feels heavy. Please start taking crestor to help lower your cholesterol. Please complete orange card paperwork soon. Follow-up in 4 weeks. ? ?I have ordered the following labs today: ? ?Lab Orders  ?No laboratory test(s) ordered today  ?  ? ?I will call if any are abnormal. All of your labs can be accessed through "My Chart" ?  ?My Chart Access: ?[email or web address] ? ? ?Referrals ordered today:  ? ?Referral Orders  ?No referral(s) requested today  ?  ? ?I have ordered the following medication/changed the following medications:  ? ?Stop the following medications: ?There are no discontinued medications.  ? ?Start the following medications: ?No orders of the defined types were placed in this encounter. ?  ? ?Follow-up:  1 month   ? ?Please make sure to arrive 15 minutes prior to your next appointment. If you arrive late, you may be asked to reschedule.  ? ?We look forward to seeing you next time. Please call our clinic at [phone] if you have any questions or concerns. The best time to call is Monday-Friday from 9am-4pm, but there is someone available 24/7. If after hours or the weekend, call the main hospital number and ask for the Internal Medicine Resident On-Call. If you need medication refills,  please notify your pharmacy one week in advance and they will send [place] a request. ? ?Thank you for letting us take part in your care. Wishing you the best! ? ?Thank you, ?Dr. Howie Ill ?[place]  ?

## 2021-07-19 NOTE — Progress Notes (Addendum)
? ? ?Subjective:  ?CC: hospital follow-up after acute on chronic hypoxic respiratory failure 2/2 to interstitial lung disease ? ?HPI: ? ?Olivia [name] is a 56 y.o. female with a past medical history stated below and presents today for hospital follow-up after acute on chronic hypoxic respiratory failure 2/2 to interstitial lung disease. Please see problem based assessment and plan for additional details. ? ?Past Medical History:  ?Diagnosis Date  ? GERD (gastroesophageal reflux disease)   ? Pulmonary fibrosis ([place])   ? ? ?Current Outpatient Medications on File Prior to Visit  ?Medication Sig Dispense Refill  ? albuterol (VENTOLIN HFA) 108 (90 Base) MCG/ACT inhaler Inhale 2 puffs into the lungs every 6 (six) hours as needed for wheezing or shortness of breath. 18 g 5  ? Baclofen 5 MG TABS Take 1 tablet by mouth 3 (three) times daily as needed for muscle spasms. 30 each 0  ? gabapentin (NEURONTIN) 100 MG capsule One three times daily (Patient taking differently: Take 100 mg by mouth 3 (three) times daily. One three times daily) 90 capsule 11  ? predniSONE (DELTASONE) 20 MG tablet Take 3 tabs by mouth daily with breakfast for 6 days, THEN 2.5 tabs daily with breakfast for 7 days, THEN 2 tabs daily for 7 days then 1.5 tabs daily for 7 days then 1 tab daily for 7 days, THEN 0.5 tab daily with breakfast for 14 days. 74 tablet 0  ? ?No current facility-administered medications on file prior to visit.  ? ? ?Family History  ?Problem Relation Age of Onset  ? Diabetes Father   ? Hypertension Father   ? Asthma Sister   ? Diabetes Paternal Grandfather   ? Breast cancer Neg Hx   ? ? ?Social History  ? ?Socioeconomic History  ? Marital status: Married  ?  Spouse name: Not on file  ? Number of children: 3  ? Years of education: Not on file  ? Highest education level: 9th grade  ?Occupational History  ? Not on file  ?Tobacco Use  ? Smoking status: Never  ? Smokeless tobacco: Never  ?Vaping Use  ? Vaping Use: Never used   ?Substance and Sexual Activity  ? Alcohol use: Not Currently  ? Drug use: No  ? Sexual activity: Not Currently  ?  Birth control/protection: Post-menopausal  ?Other Topics Concern  ? Not on file  ?Social History Narrative  ? Native of [place], [place]  ? Husband, [place], alcoholic  ? 3 children, Oldest son in [place]  ? [place], 3  ?   ?   ? ?Social Determinants of Health  ? ?Financial Resource Strain: High Risk  ? Difficulty of Paying Living Expenses: Very hard  ?Food Insecurity: No Food Insecurity  ? Worried About Charity fundraiser in the Last Year: Never true  ? Ran Out of Food in the Last Year: Never true  ?Transportation Needs: No Transportation Needs  ? Lack of Transportation (Medical): No  ? Lack of Transportation (Non-Medical): No  ?Physical Activity: Not on file  ?Stress: Not on file  ?Social Connections: Not on file  ?Intimate Partner Violence: Not on file  ? ? ?Review of Systems: ?ROS negative except for what is noted on the assessment and plan. ? ?Objective:  ? ?Vitals:  ? 07/19/21 1348  ?BP: 130/83  ?Pulse: 90  ?Temp: 98.1 ?F (36.7 ?C)  ?TempSrc: Oral  ?SpO2: 99%  ?Weight: 166 lb 4.8 oz (75.4 kg)  ? ? ?Physical Exam: ?Gen: A&O x3 and in  no apparent distress, well appearing and nourished. ?HEENT:  ?  Head - normocephalic, atraumatic.  ?  Eye - visual acuity grossly intact, conjunctiva clear, sclera non-icteric, EOM intact.  ?  Mouth - No obvious caries or periodontal disease. ?Neck: no masses or nodules, AROM intact. ?CV: RRR, no murmurs, S1/S2 presents  ?Resp: Crackles appreciated in lower lung bases bilaterally, patient is breathing comfortably on 3L [place], clubbing of fingers and toes present ?Abd: BS (+) x4, soft, non-tender abdomen, without hepatosplenomegaly or masses ?MSK: varicose veins one lower extremities bilaterally, trace edema present that is symmetric in lower extremities, dorsalis pedis pulses 1+ on left and right. Muscle sctrength is 5/5 in foot extension and flexion  bilaterally. ?ABI results with left 0.61 and right 0.64 ?Skin: good skin turgor, no rashes, unusual bruising, or prominent lesions.  ?Neuro: No focal deficits, grossly normal sensation and coordination.  ?Psych: Oriented x3 and responding appropriately. Intact memory, normal mood, judgement, affect, and insight.  ? ? ?Assessment & Plan:  ?GERD (gastroesophageal reflux disease) ?Medications: on PPI 40 mg since starting steroid ?Patient has been on chronic steroid since 2020. She was placed on omeprazole at that time. She states that she has right sided abdominal pain. This is worse after eating, especially with spicy foods. She is unsure if omeprazole is helping and is actually concerned it could be causing pain. CT abd completed during recent admission and was negative for acute findings. ?A/P: ?-With patient history of steroid use, presentation concerning for gastric ulcer. Patient does still have her gallbladder, however she states that pain is not worse have fat-containing foods but is after spicy. ?-Consider h pylori stool antigen testing at follow-up once patient applies for orange card. She was given another packet today. She would also benefit from GI referral ?-Omeprazole switched to pantoprazole ?-Could consider increasing dose to BID if she continues to have pain. ? ?Interstitial pulmonary disease ([place]) ?Hospital follow-up for acute on chronic hypoxic respiratory failure ?Medications: Chronic prednisone 10 mg, doing steroid taper until f/u with pulm 08/11/21 ?Sent home on oxygen through [employer]. From chart review and speaking with medical staff at [facility], patient has continued supply of oxygen through [employer]. Required paperwork was completed while patient was in hospital. ?Follows with pulmonology. On exam patient is breathing comfortably on 3L [place]. Lungs with mild crackles present in base of lungs bilaterally. She has clubbing of fingers and toes. ?A/P: ?F/u with pulm 08/10/21 ?At risk for metabolic syndrome with  chronic steroid. ASCVD risk of 2.9%. Hgb A1c at 5.2.  ? ?Mononeuropathy ?Patient presents with heavy sensation that is present in right foot. She states that this improves with ambulation. She denies weakness. She has had this for many months.  ?On exam, patient has varicose veins one lower extremities bilaterally, trace edema present that is symmetric in lower extremities, dorsalis pedis pulses 1+ on left and right. Muscle sctrength is 5/5 in foot extension and flexion bilaterally. ?ABI results with left 0.61 and right 0.64 ?A: ?Differentials include venous insufficiency versus PVD. Symptoms are not consistent with claudication. Unsure if occlusion is leading to "heavy" sensation of right foot. Encouraged patient to continue with activity level as tolerated.  ?P: ?F/u in 4 weeks ? ?PVD (peripheral vascular disease) ([place]) ?ABI abnormal with Left 0.61 and right at 0.64. Talked with patient about benefits of starting statin therapy to decrease risk of cholesterol deposition in arteries worsening. She is amenable to starting statin therapy at this time. CT abd showed mild aortic atherosclerosis. She  has a history of FOBT+ and symptoms consistent with possible gastric ulcer, will hold off on antiplatelet therapy at this time. ?P: ?Start rosuvastatin 20 mg ? ?Addendum 3/30: ?Screening test with abnormally high blood pressure in upper extremities. When blood pressure checked with manual cuff it was within normal limits (see media for ABI report). ?Please repeat ABI screening at follow-up. If abnormal, patient should be referred to [facility]. Fatima Sanger available if orange card has not been approved yet. ? ?Colon cancer screening ?FOBT+ in 2021. Patient denies seeing blood in stool. Hgb within normal limits at 14 during recent admission. ?-applying for orange care then refer to GI  ? ?Patient discussed with Dr. Philipp Ovens ? ? ?Olivia [name], D.O. ?[facility]  PGY-[id number] ?Pager: [phone]  Phone: [phone] ?Date  08/02/2021  Time 9:56 AM  ?

## 2021-07-19 NOTE — Progress Notes (Signed)
Olivia [name] is a 56 y.o. [phone] female who presents to [facility] clinic today with no complaints.  ?  ?Pap Smear: Pap smear completed today. Last Pap smear was 02/16/2018 at the free cervical cancer screening at the [facility] and normal. Per patient has no history of an abnormal Pap smear. Last three Pap smear results are in Epic. ?  ?Physical exam: ?Breasts ?Breasts symmetrical. No skin abnormalities bilateral breasts. No nipple retraction bilateral breasts. No nipple discharge bilateral breasts. No lymphadenopathy. No lumps palpated bilateral breasts. No complaints of pain or tenderness on exam. ? ?MM DIAG BREAST TOMO BILATERAL ? ?Result Date: 08/01/2016 ?CLINICAL DATA:  56 year old female with a palpable abnormality in her right axilla. EXAM: 2D DIGITAL DIAGNOSTIC BILATERAL MAMMOGRAM WITH CAD AND ADJUNCT TOMO ULTRASOUND RIGHT AXILLA COMPARISON:  Previous exam(s). ACR Breast Density Category b: There are scattered areas of fibroglandular density. FINDINGS: No suspicious masses or calcifications are seen in either breast. Spot compression tangential tomograms were performed over the palpable area of concern in the right axilla with no definite abnormality identified. Mammographic images were processed with CAD. Physical examination at site of palpable concern in the right axilla reveals a small firm nodule within the skin. There is a central black pore with findings most suggestive of a sebaceous cyst. Targeted ultrasound of the right axilla was performed demonstrating an oval near anechoic mass with floating debris measuring 0.3 x 0.3 x 0.5 cm. This appears within the skin in a small thin linear tract is seen extending to the skin surface. Findings are most compatible with a benign sebaceous cyst. IMPRESSION: Benign sebaceous cyst at site of palpable concern in the right axilla. There is no mammographic evidence of malignancy in either breast. RECOMMENDATION: 1. Patient was instructed to  apply warm compress to the sebaceous cyst if it becomes enlarged or inflamed and to seek further care from her primary care provider if she experiences any symptoms of infection. 2.  Screening mammogram in one year.(Code:SM-B-01Y) I have discussed the findings and recommendations with the patient. Results were also provided in writing at the conclusion of the visit. If applicable, a reminder letter will be sent to the patient regarding the next appointment. BI-RADS CATEGORY  2: Benign. Electronically Signed   By: Everlean Alstrom M.D.   On: 08/01/2016 09:53  ? ?MS DIGITAL SCREENING TOMO BILATERAL ? ?Result Date: 11/27/2018 ?CLINICAL DATA:  Screening. EXAM: DIGITAL SCREENING BILATERAL MAMMOGRAM WITH TOMO AND CAD COMPARISON:  Previous exam(s). ACR Breast Density Category b: There are scattered areas of fibroglandular density. FINDINGS: There are no findings suspicious for malignancy. Images were processed with CAD. IMPRESSION: No mammographic evidence of malignancy. A result letter of this screening mammogram will be mailed directly to the patient. RECOMMENDATION: Screening mammogram in one year. (Code:SM-B-01Y) BI-RADS CATEGORY  1: Negative. Electronically Signed   By: Claudie Revering M.D.   On: 11/27/2018 12:26   ?  ?Pelvic/Bimanual ?Ext Genitalia ?No lesions, no swelling and no discharge observed on external genitalia.      ?  ?Vagina ?Vagina pink and normal texture. No lesions or discharge observed in vagina.      ?  ?Cervix ?Cervix is present. Cervix pink and of normal texture. No discharge observed.  ?  ?Uterus ?Uterus is present and palpable. Uterus in normal position and normal size.      ?  ?Adnexae ?Bilateral ovaries present and palpable. No tenderness on palpation.       ?  ?Rectovaginal ?No  rectal exam completed today since patient had no rectal complaints. No skin abnormalities observed on exam.   ?  ?Smoking History: ?Patient has never smoked. ?  ?Patient Navigation: ?Patient education provided. Access to  services provided for patient through [facility] program. Spanish interpreter Geraldo Pitter from [place] provided.  ? ?Colorectal Cancer Screening: ?Per patient has never had a colonoscopy completed. FIT Test completed 08/05/2016 that was negative and 03/26/2020 that was positive. No complaints today.  ?  ?Breast and Cervical Cancer Risk Assessment: ?Patient does not have family history of breast cancer, known genetic mutations, or radiation treatment to the chest before age 73. Patient does not have history of cervical dysplasia, immunocompromised, or DES exposure in-utero. ? ?Risk Assessment   ? ? Risk Scores   ? ?   07/19/2021 11/26/2018  ? Last edited by: Royston Bake, CMA Armond Hang, LPN  ? 5-year risk: 0.6 % 0.6 %  ? Lifetime risk: 4.6 % 4.8 %  ? ?  ?  ? ?  ? ? ?A: ?BCCCP exam with pap smear ?No complaints. ? ?P: ?Referred patient to the [place] for a screening mammogram on mobile unit. Appointment scheduled Thursday, July 19, 2021 at 1130. ? ?Loletta Parish, RN ?07/19/2021 11:12 AM   ?

## 2021-07-20 DIAGNOSIS — Z1211 Encounter for screening for malignant neoplasm of colon: Secondary | ICD-10-CM | POA: Insufficient documentation

## 2021-07-20 DIAGNOSIS — G589 Mononeuropathy, unspecified: Secondary | ICD-10-CM | POA: Insufficient documentation

## 2021-07-20 DIAGNOSIS — I739 Peripheral vascular disease, unspecified: Secondary | ICD-10-CM | POA: Insufficient documentation

## 2021-07-20 NOTE — Assessment & Plan Note (Signed)
Patient presents with heavy sensation that is present in right foot. She states that this improves with ambulation. She denies weakness. She has had this for many months.  ?On exam, patient has varicose veins one lower extremities bilaterally, trace edema present that is symmetric in lower extremities, dorsalis pedis pulses 1+ on left and right. Muscle sctrength is 5/5 in foot extension and flexion bilaterally. ?ABI results with left 0.61 and right 0.64 ?A: ?Differentials include venous insufficiency versus PVD. Symptoms are not consistent with claudication. Unsure if occlusion is leading to "heavy" sensation of right foot. Encouraged patient to continue with activity level as tolerated.  ?P: ?F/u in 4 weeks ?

## 2021-07-20 NOTE — Assessment & Plan Note (Addendum)
Medications: on PPI 40 mg since starting steroid ?Patient has been on chronic steroid since 2020. She was placed on omeprazole at that time. She states that she has right sided abdominal pain. This is worse after eating, especially with spicy foods. She is unsure if omeprazole is helping and is actually concerned it could be causing pain. CT abd completed during recent admission and was negative for acute findings. ?A/P: ?-With patient history of steroid use, presentation concerning for gastric ulcer. Patient does still have her gallbladder, however she states that pain is not worse have fat-containing foods but is after spicy. ?-Consider h pylori stool antigen testing at follow-up once patient applies for orange card. She was given another packet today. She would also benefit from GI referral ?-Omeprazole switched to pantoprazole ?-Could consider increasing dose to BID if she continues to have pain. ?

## 2021-07-20 NOTE — Assessment & Plan Note (Signed)
Hospital follow-up for acute on chronic hypoxic respiratory failure ?Medications: Chronic prednisone 10 mg, doing steroid taper until f/u with pulm 08/11/21 ?Sent home on oxygen through [employer]. From chart review and speaking with medical staff at [facility], patient has continued supply of oxygen through [employer]. Required paperwork was completed while patient was in hospital. ?Follows with pulmonology. On exam patient is breathing comfortably on 3L [place]. Lungs with mild crackles present in base of lungs bilaterally. She has clubbing of fingers and toes. ?A/P: ?F/u with pulm 08/10/21 ?At risk for metabolic syndrome with chronic steroid. ASCVD risk of 2.9%. Hgb A1c at 5.2.  ?

## 2021-07-20 NOTE — Assessment & Plan Note (Addendum)
ABI abnormal with Left 0.61 and right at 0.64. Talked with patient about benefits of starting statin therapy to decrease risk of cholesterol deposition in arteries worsening. She is amenable to starting statin therapy at this time. CT abd showed mild aortic atherosclerosis. She has a history of FOBT+ and symptoms consistent with possible gastric ulcer, will hold off on antiplatelet therapy at this time. ?P: ?Start rosuvastatin 20 mg ? ?Addendum 3/30: ?Screening test with abnormally high blood pressure in upper extremities. When blood pressure checked with manual cuff it was within normal limits (see media for ABI report). ?Please repeat ABI screening at follow-up. If abnormal, patient should be referred to [facility]. Fatima Sanger available if orange card has not been approved yet. ?

## 2021-07-20 NOTE — Assessment & Plan Note (Signed)
FOBT+ in 2021. Patient denies seeing blood in stool. Hgb within normal limits at 14 during recent admission. ?-applying for orange care then refer to GI ?

## 2021-07-23 LAB — CYTOLOGY - PAP
Comment: NEGATIVE
Diagnosis: NEGATIVE
High risk HPV: NEGATIVE

## 2021-07-24 ENCOUNTER — Telehealth: Payer: Self-pay

## 2021-07-24 NOTE — Telephone Encounter (Signed)
Called patient via Olivia [name], [employer] to give pap smear results. Informed patient that pap smear was normal and HPV was negative. Based on this result her next pap smear will be due in 5 years. Patient voiced understanding.  

## 2021-07-26 NOTE — Progress Notes (Addendum)
Internal Medicine Clinic Attending  Case discussed with Dr. Masters  At the time of the visit.  We reviewed the resident's history and exam and pertinent patient test results.  I agree with the assessment, diagnosis, and plan of care documented in the resident's note.  

## 2021-07-28 LAB — FECAL OCCULT BLOOD, IMMUNOCHEMICAL: Fecal Occult Bld: POSITIVE — AB

## 2021-07-28 LAB — SPECIMEN STATUS REPORT

## 2021-07-30 ENCOUNTER — Telehealth: Payer: Self-pay

## 2021-07-30 ENCOUNTER — Telehealth: Payer: Self-pay | Admitting: Internal Medicine

## 2021-07-30 ENCOUNTER — Inpatient Hospital Stay (INDEPENDENT_AMBULATORY_CARE_PROVIDER_SITE_OTHER): Payer: No Typology Code available for payment source | Admitting: Primary Care

## 2021-07-30 DIAGNOSIS — J9621 Acute and chronic respiratory failure with hypoxia: Secondary | ICD-10-CM

## 2021-07-30 DIAGNOSIS — R195 Other fecal abnormalities: Secondary | ICD-10-CM

## 2021-07-30 NOTE — Telephone Encounter (Signed)
Received phone call from [place], [place] translator, in reference to this patient. She would like a referral for GI, due to her FIT test being abnormal.  Forwarding to PCP and Dr. Christiana Fuchs, since she saw the patient last. ?

## 2021-07-30 NOTE — Progress Notes (Signed)
She is scheduled with Internal Medicine on 08/16/21.

## 2021-07-30 NOTE — Telephone Encounter (Signed)
Called patient via [employer] # [phone] to give FIT test results. Informed patient that FIT test was  abnormal . Patient is scheduled for FU appt with Internal Medicine on 08/16/21. Will send message to Internal Medicine to follow-up with patient at appt. Patient voiced understanding. ?

## 2021-07-31 ENCOUNTER — Telehealth: Payer: Self-pay

## 2021-07-31 NOTE — Telephone Encounter (Signed)
07/30/2021-Via Lavon Paganini, Spanish Interpreter [facility]), Patient informed positive FIT test results, needs to follow-up with pcp. Patient verbalized understanding, stated she will contact her pcp.  ?

## 2021-08-01 ENCOUNTER — Other Ambulatory Visit (HOSPITAL_COMMUNITY): Payer: Self-pay

## 2021-08-01 MED ORDER — GUAIFENESIN-CODEINE 100-10 MG/5ML PO SOLN
5.0000 mL | Freq: Four times a day (QID) | ORAL | 0 refills | Status: DC
  Filled 2021-08-01: qty 120, 6d supply, fill #0

## 2021-08-01 NOTE — Telephone Encounter (Signed)
Received message that patient had +FIT test. Called and spoke with patient about GI referral. She states that she plans to turn in orange card paperwork tomorrow morning and would prefer to wait on referral until after this is approved. She is not able to pay for GI referral out of pocket. I will reach out to Milus Height, [facility] social worker for additional support with making sure patient completes paperwork. ? ?Her oxygen saturations have been 96-98% with supplemental oxygen. Without supplemental oxygen, her oxygen saturations have been between 88-92%. She states that her cough is worsening and that cough syrup was helping before she ran out. During phone encounter she is able to talk in full sentences, but does frequently cough at end of sentence. Will send in refill on guaifenesin-codeine syrup at this time. ?She follows up with Dr. Melvyn Novas on 4/25. ?

## 2021-08-06 ENCOUNTER — Telehealth: Payer: Self-pay | Admitting: *Deleted

## 2021-08-06 NOTE — Chronic Care Management (AMB) (Signed)
?  Care Management  ? ?Note ? ?08/06/2021 ?Name: Olivia [name] MRN: [id number] DOB: 1965-09-30 ? ?Olivia [name] is a 56 y.o. year old female who is a primary care patient of Delene Ruffini, MD. I reached out to [employer] by phone today using [employer] [phone] named Nykeria Becton to offer care coordination services.  ? ?[name] was given information about care management services today including:  ?Care management services include personalized support from designated clinical staff supervised by her physician, including individualized plan of care and coordination with other care providers ?24/7 contact phone numbers for assistance for urgent and routine care needs. ?The patient may stop care management services at any time by phone call to the office staff. ? ?Patient agreed to services and verbal consent obtained.  ? ?Follow up plan: ?Telephone appointment with care management team member scheduled for:08/13/21 ? ?Laverda Sorenson  ?Care Guide, Embedded Care Coordination ?[place]  Care Management  ?Direct Dial: [phone] ? ?

## 2021-08-10 ENCOUNTER — Ambulatory Visit: Payer: No Typology Code available for payment source | Admitting: Internal Medicine

## 2021-08-13 ENCOUNTER — Telehealth: Payer: Self-pay | Admitting: Licensed Clinical Social Worker

## 2021-08-13 ENCOUNTER — Telehealth: Payer: No Typology Code available for payment source | Admitting: Licensed Clinical Social Worker

## 2021-08-13 NOTE — Telephone Encounter (Signed)
?  Care Management  ? ?Follow Up Note ? ? ?08/13/2021 ?Name: KHADIJA THIER MRN: [id number] DOB: 01/27/1966 ? ? ?Referred by: Delene Ruffini, MD ?Reason for referral : No chief complaint on file. ? ? ?An unsuccessful telephone outreach was attempted today. The patient was referred to the case management team for assistance with care management and care coordination.  ? ?*Spanish Interpreter used and unable to get patient on the line. ? ?Follow Up Plan: The care management team will reach out to the patient again over the next 30 days.  ? ?Milus Height, BSW  ?Social Worker ?[facility] Care Management  ?[phone] ?  ?

## 2021-08-16 ENCOUNTER — Encounter: Payer: Self-pay | Admitting: Internal Medicine

## 2021-08-16 ENCOUNTER — Other Ambulatory Visit (HOSPITAL_COMMUNITY): Payer: Self-pay

## 2021-08-16 ENCOUNTER — Ambulatory Visit (INDEPENDENT_AMBULATORY_CARE_PROVIDER_SITE_OTHER): Payer: Self-pay | Admitting: Internal Medicine

## 2021-08-16 ENCOUNTER — Ambulatory Visit: Payer: No Typology Code available for payment source | Admitting: Internal Medicine

## 2021-08-16 VITALS — BP 101/64 | HR 101 | Wt 164.8 lb

## 2021-08-16 DIAGNOSIS — Z1211 Encounter for screening for malignant neoplasm of colon: Secondary | ICD-10-CM

## 2021-08-16 DIAGNOSIS — N3001 Acute cystitis with hematuria: Secondary | ICD-10-CM

## 2021-08-16 MED ORDER — AMOXICILLIN-POT CLAVULANATE 500-125 MG PO TABS
1.0000 | ORAL_TABLET | Freq: Two times a day (BID) | ORAL | 0 refills | Status: DC
  Filled 2021-08-16: qty 14, 7d supply, fill #0

## 2021-08-16 NOTE — Progress Notes (Signed)
? ?CC: positive FIT test, urinary discomfort ? ?HPI: ? ?[name] is a 56 y.o. female with past medical history as detailed below who presents for a follow-up for positive FIT testing as well as new hematuria and urinary discomfort. Please see problem based charting for detailed assessment and plan. ? ?Past Medical History:  ?Diagnosis Date  ? Acute respiratory distress 03/27/2020  ? Elevated blood pressure reading 12/21/2018  ? GERD (gastroesophageal reflux disease)   ? Healthcare maintenance 06/28/2019  ? Per patient's recent discharge summary, FOBT positive in ED. Given her overall prognosis and health condition, will hold off discussing colonoscopy.  ? History of COVID-19 09/02/2019  ? 05/03/19 - [place] - positive  Did not get mab  Did not get hosp  ? Preventative health care 01/15/2013  ? Pulmonary fibrosis ([place])   ? Screening breast examination 11/26/2018  ? ?Review of Systems:   ?Review of Systems  ?Constitutional:  Positive for chills. Negative for fever.  ?Respiratory:  Positive for cough, sputum production and shortness of breath.   ?Cardiovascular:  Negative for chest pain.  ?Gastrointestinal:  Negative for abdominal pain, blood in stool, constipation, diarrhea and melena.  ?Genitourinary:  Positive for dysuria, frequency and hematuria. Negative for flank pain and urgency.  ? ? ?Physical Exam: ? ?Vitals:  ? 08/16/21 1508  ?BP: 101/64  ?Pulse: (!) 101  ?SpO2: 100%  ?Weight: 164 lb 12.8 oz (74.8 kg)  ? ?Constitutional:Pleasant female in no acute distress. ?Cardio:Regular rate and rhythm. No murmurs, rubs, gallops. ?Pulm:Clear to auscultation bilaterally. On supplemental oxygen without accessory muscle use. ?Abdomen:Soft, nondistended. +tenderness over suprapubic area. ?GU: Negative CVA tenderness bilaterally. ?TGG:YIRSWNIO for extremity edema.  ?Skin:Warm and dry. ?Neuro:Alert and oriented x3. No focal deficit noted. ?Psych:Normal mood and affect. ? ?Assessment & Plan:  ? ?Acute cystitis with  hematuria ?Patient reports onset last night of hematuria with sharp pain in the suprapubic region with pain and burning with urination. She has increased frequency with decreased amount of urination with each attempt. She denies fever but has had chills and is not concerned for STI at this time. She does not have flank pain. Dipstick in clinic revealed red cloudy urine with large blood, protein 100, small leukocytes. ?Assessment: She is hypotensive with mild tachycardia in addition to dipstick results and sypmtoms concerning for a hemorrhagic acute cystitis. She is on prednisone for pulmonary reasons leading to a degree of immunocompromise so I worry that she is at risk for becoming more severely ill more quickly. I am not convinced at this time that she has ascending infection or pyelonephritis. ?Plan:Patient is uninsured so I have sent in augmentin 500-125 mg BID for 7 days as this is on the $4 list at the [place]. I have also sent urinalysis and culture in. I have counseled the patient to go to ED for further evaluation if she develops fever or chills, progressive or new pain. ? ?ADDENDUM: Urinalysis revealed leukocytes 1+, protein 1+, RBC 3+, nitrite positive. Microscopic analysis revealed WBC 11-30, RBC >30, many bacteria. Culture positive for E. Coli.  ? ?Colon cancer screening ?Patient recently had FIT testing done which was positive. She is currently in the process of obtaining an [employer] and submitted the paperwork around 4 weeks ago. She denies any abdominal pain, diarrhea, constipation, and has not noted frank blood or dark stools. ?Assessment:Unfortunately referral to GI for colonoscopy is dependent on approval for [employer].  ?Plan:Refer to GI for colonoscopy once Orange Card is approved. ? ? ?  See Encounters Tab for problem based charting. ? ?Patient discussed with Dr. Jimmye Norman ? ?

## 2021-08-16 NOTE — Patient Instructions (Signed)
Olivia [name], ? ?Fue Health and safety inspector. Hoy discutimos su reciente prueba de heces positiva y sangre nueva en su orina con dolor e incomodidad. He enviado un antibi?tico llamado Augmentin. Lo tomar? dos veces al d?a durante 7 d?as y haremos un seguimiento en 4 semanas para revisar su orina nuevamente para asegurarnos de que la sangre se haya resuelto. Una vez que se apruebe su [place], enviar? su referencia a los gastroenter?logos para evaluarlo m?s a fondo para la prueba de heces positiva. ? ?Llame a la cl?nica o informe al departamento de emergencias si comienza a tener fiebre persistente, n?useas, v?mitos o dolor intenso. ? ?Recuerde: si tiene alguna pregunta o inquietud, llame a nuestra cl?nica al [phone] [employer] 9 a. m. y las 5 p. m. y despu?s del horario de atenci?n llame al [phone] y pregunte por el residente de [place] interna de [place]. Si cree que tiene una emergencia m?dica, llame al 911. ? ?Farrel Gordon, DO ? ? ?Olivia [name], ? ?It was a pleasure meeting you today. Today we discussed your recent positive stool test and new blood in your urine with pain and discomfort. I have sent in an antibiotic called Augmentin. You will take this twice daily for 7 days, and we will follow up in 4 weeks to check your urine again to make sure the blood has resolved. Once your [employer] is approved, I will send your referral to the gastroenterologists to evaluate you further for the positive stool test. ? ?Please call the clinic or report to the emergency department if you begin having persistent fever, nausea, vomiting, or severe pain. ? ?Remember: If you have any questions or concerns, please call our clinic at [phone] between 9am-5pm and after hours call [phone] and ask for the internal medicine resident on call. If you feel you are having a medical emergency please call 911. ? ?Farrel Gordon, DO ?

## 2021-08-17 LAB — URINALYSIS, ROUTINE W REFLEX MICROSCOPIC
Bilirubin, UA: NEGATIVE
Glucose, UA: NEGATIVE
Ketones, UA: NEGATIVE
Nitrite, UA: POSITIVE — AB
Specific Gravity, UA: 1.01 (ref 1.005–1.030)
Urobilinogen, Ur: 0.2 mg/dL (ref 0.2–1.0)
pH, UA: 6.5 (ref 5.0–7.5)

## 2021-08-17 LAB — MICROSCOPIC EXAMINATION
Casts: NONE SEEN /lpf
RBC, Urine: >30 /hpf — AB (ref 0–2)

## 2021-08-18 DIAGNOSIS — N3001 Acute cystitis with hematuria: Secondary | ICD-10-CM | POA: Insufficient documentation

## 2021-08-18 NOTE — Assessment & Plan Note (Addendum)
Patient reports onset last night of hematuria with sharp pain in the suprapubic region with pain and burning with urination. She has increased frequency with decreased amount of urination with each attempt. She denies fever but has had chills and is not concerned for STI at this time. She does not have flank pain. She has had nephrolithiasis in the past however at that time she had bilateral flank pain, which she does not have at this time. Dipstick in clinic revealed red cloudy urine with large blood, protein 100, small leukocytes. ?Assessment: She is hypotensive with mild tachycardia in addition to dipstick results and sypmtoms concerning for a hemorrhagic acute cystitis. She is on prednisone for pulmonary reasons leading to a degree of immunocompromise so I worry that she is at risk for becoming more severely ill more quickly. I am not convinced at this time that she has ascending infection or pyelonephritis. ?Plan:Patient is uninsured so I have sent in augmentin 500-125 mg BID for 7 days as this is on the $4 list at the [place]. I have also sent urinalysis and culture in. I have counseled the patient to go to ED for further evaluation if she develops fever or chills, progressive or new pain. ? ?ADDENDUM: Urinalysis revealed leukocytes 1+, protein 1+, RBC 3+, nitrite positive. Microscopic analysis revealed WBC 11-30, RBC >30, many bacteria. Culture positive for E. Coli.  ?

## 2021-08-19 NOTE — Assessment & Plan Note (Signed)
Patient recently had FIT testing done which was positive. She is currently in the process of obtaining an [employer] and submitted the paperwork around 4 weeks ago. She denies any abdominal pain, diarrhea, constipation, and has not noted frank blood or dark stools. ?Assessment:Unfortunately referral to GI for colonoscopy is dependent on approval for [employer].  ?Plan:Refer to GI for colonoscopy once Orange Card is approved. ?

## 2021-08-20 LAB — URINE CULTURE

## 2021-08-21 ENCOUNTER — Other Ambulatory Visit: Payer: Self-pay | Admitting: Internal Medicine

## 2021-08-23 ENCOUNTER — Ambulatory Visit: Payer: No Typology Code available for payment source | Admitting: Licensed Clinical Social Worker

## 2021-08-23 NOTE — Chronic Care Management (AMB) (Signed)
?  Care Management  ? ?Social Work Visit Note ? ?08/23/2021 ?Name: Olivia [name] MRN: [id number] DOB: December 28, 1965 ? ?Olivia [name] is a 56 y.o. year old female who sees Delene Ruffini, MD for primary care. The care management team was consulted for assistance with care management and care coordination needs related to Financial Difficulties related to [place]   ? ?Patient was given the following information about care management and care coordination services today, agreed to services, and gave verbal consent: 1.care management/care coordination services include personalized support from designated clinical staff supervised by their physician, including individualized plan of care and coordination with other care providers 2. 24/7 contact phone numbers for assistance for urgent and routine care needs. 3. The patient may stop care management/care coordination services at any time by phone call to the office staff. ? ?Engaged with patient by telephone for initial visit in response to provider referral for social work chronic care management and care coordination services. ? ?Assessment: Review of patient history, allergies, and health status during evaluation of patient need for care management/care coordination services.   ? ?Interventions:  ?Patient interviewed and appropriate assessments performed ?Collaborated with clinical team regarding patient needs  ?SW used interpreter Marlowe Alt [phone] needed update on orange card application. Patient has submitted all documents about a month ago. SW contact [facility]. Patient needs to contact DSS via phone or go to agency. SW gave patient contact information. SW gave patient address.  ?SW inquired on any additional needs. Patient advised no. Patient has upcoming appointments and providers are asking for updates on insurance.  ?SW gave patient SW contact information if needed in the future.  ? ?SDOH (Social Determinants of Health)  assessments performed: Yes ?   ? ?Plan:  ?Patient to call provider or SW if needed. No further follow up is needed.  ? ?Milus Height, BSW  ?Social Worker ?[facility] Care Management  ?[phone] ?  ? ? ? ? ? ? ? ? ? ? ? ? ? ? ?

## 2021-08-23 NOTE — Patient Instructions (Signed)
Visit Information ? ? ?Patient was given the following information about care management and care coordination services today, agreed to services, and gave verbal consent: 1.care management/care coordination services include personalized support from designated clinical staff supervised by their physician, including individualized plan of care and coordination with other care providers 2. 24/7 contact phone numbers for assistance for urgent and routine care needs. 3. The patient may stop care management/care coordination services at any time by phone call to the office staff. ? ?Patient verbalizes understanding of instructions and care plan provided today and agrees to view in [place]. Active MyChart status confirmed with patient.   ? ?No further follow up required: Patient has SW contact information if needed. ? ?Milus Height, BSW  ?Social Worker ?[facility] Care Management  ?[phone] ?  ? ?  ?

## 2021-08-28 ENCOUNTER — Ambulatory Visit: Payer: No Typology Code available for payment source | Admitting: Internal Medicine

## 2021-08-29 ENCOUNTER — Other Ambulatory Visit (HOSPITAL_COMMUNITY): Payer: Self-pay

## 2021-08-29 ENCOUNTER — Encounter: Payer: Self-pay | Admitting: Internal Medicine

## 2021-08-29 ENCOUNTER — Ambulatory Visit (INDEPENDENT_AMBULATORY_CARE_PROVIDER_SITE_OTHER): Payer: Self-pay | Admitting: Internal Medicine

## 2021-08-29 DIAGNOSIS — R053 Chronic cough: Secondary | ICD-10-CM

## 2021-08-29 DIAGNOSIS — R0902 Hypoxemia: Secondary | ICD-10-CM

## 2021-08-29 DIAGNOSIS — J8489 Other specified interstitial pulmonary diseases: Secondary | ICD-10-CM

## 2021-08-29 MED ORDER — GABAPENTIN 100 MG PO CAPS
ORAL_CAPSULE | ORAL | 1 refills | Status: DC
  Filled 2021-08-29 – 2021-09-13 (×2): qty 120, 30d supply, fill #0
  Filled 2021-11-02: qty 120, 30d supply, fill #1

## 2021-08-29 MED ORDER — PREDNISONE 10 MG PO TABS
ORAL_TABLET | ORAL | 2 refills | Status: DC
  Filled 2021-08-29: qty 100, 50d supply, fill #0
  Filled 2021-11-29: qty 100, 50d supply, fill #1
  Filled 2022-01-21: qty 100, 50d supply, fill #2

## 2021-08-29 NOTE — Progress Notes (Signed)
?Olivia [name], female    DOB: [birth year],     MRN: [id number] ? ? 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 ? ? ?History of Present Illness  ?08/04/2018  Pulmonary/ 1st office eval/Olivia [name]  ?Chief Complaint  ?Patient presents with  ? Pulmonary Consult  ?  Referred by Dr Frederico Hamman. Pt c/o SOB since August 2019. She gets SOB walking short distances and also when she makes the bed.   ?Dyspnea:  Indolent onset doe summer 2019   Now [facility] = can't walk a nl pace on a flat grade s sob but does fine slow and flat  ?Cough: to point of vomiting / mucus is clear  ?Sleep: flat bed and one pillow nonoct cough  ?SABA use: don't help/ neither does flovent or tessalon prn  ?No significant arthritis but feels better/ cough less p advil  ?2 dogs/ no birds  ?No h/o rheumatologic symptoms, chemo or amiodarone or macrodantin exp. ?rec ?GERD diet  ?Omeprazole 20 mg Take 30- 60 min before your first and last meals of the day  ?Prednisone 10 mg x 2 daily until better then 1 daily until return  ?Please schedule a follow up office visit in 4 weeks, sooner if needed  with all medications /inhalers/ solutions in hand ?Please bring a family member to help interpret at this visit, thank you ? ?  ? ? ?10/27/2018  f/u ov/Olivia [name] re: pf/ cough variant asthma with uacs  On gabapentin 300 mg hs and still on prednisone 10 mg daily   ?Chief Complaint  ?Patient presents with  ? Follow-up  ?  Cough has improved some.   ?Dyspnea:  Walking up to  30 min daily ?Cough: better esp while on gabapentin / min mucoid ?Sleeping: fine flat one pillow ?SABA use: very little ?rec ?Work on inhaler technique:   ?Try prednisone 10 mg one half daily - if worse then  start back at 20 mg daily until better, then 10 mg per day for a week then back to 5 mg daily  ? ?  ?05/03/19   POS COVID testing  ? ?   ?  ? ? 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 Follow-up  ?  Breathing has been doing well. She is using her albuterol inhaler about 3 x per wk.   ?Dyspnea:  No change ability to do ht s -2  ?Cough: dry daytime with insp ?Sleeping: bed is flat / sleeping 7-8 h per night  ?SABA use: as above  ?02: not using at all  ?Covid status:   Vaccine x 2 and infected ?Rec ?No change in medications  ?We will cancel the 02 today and order an ONO on Room Air >  12/13/20 desats x 10 min @ < 89% so ok to leave off  ? ?Date of Admission: 07/07/2021    ?Date of Discharge:   07/09/21 ? ?Discharge Diagnosis: ?1. Acute on chronic respiratory failure with hypoxia; Chronic cough; Right-sided flank pain; Nonspecific Interstitial Pneumonia ?2. GERD ?3. Hx of depression ?4. Hyperlipidemia; Mild aortic atherosclerosis ?   ?08/29/2021  f/u ov/Olivia [name] re: NSIP    maint on 10 mg /day  - note above flare occurred at 10 mg and evolved over 10 days and did not call or increase dose as previously rec  ?Chief Complaint  ?Patient presents with  ? Follow-up  ?  Patient feels like she is doing good, no concerns  ? Dyspnea:  30 min flat surface slow pace s 02 - can't do inclines or stops- sats high 80s typically which she says is normal for her. ?Cough: on gabapentin 100 mg tid still sporadic harsh drry  ?Sleeping: flat bed / one pillow s resp cc ?SABA use: 1-3 x per day, not noct  ?02: just at hs 2lpm  ?  ? ? ?No obvious patterns in  day to day or daytime variability or assoc excess/ purulent sputum or mucus plugs or hemoptysis or cp or chest tightness, subjective wheeze or overt sinus or hb symptoms.  ? ?Sleeping as above  without nocturnal  or early am exacerbation  of respiratory  c/o's or need for noct saba. Also denies any obvious fluctuation of symptoms with weather  or environmental changes or other aggravating or alleviating factors except as outlined above  ? ?No unusual exposure hx or h/o childhood pna/ asthma or knowledge of premature birth. ? ?Current Allergies, Complete Past Medical History, Past Surgical History, Family History, and Social History were reviewed in [employer] record. ? ?ROS  The following are not active complaints unless bolded ?Hoarseness, sore throat, dysphagia, dental problems, itching, sneezing,  nasal congestion or discharge of excess mucus or purulent secretions, ear ache,   fever, chills, sweats, unintended wt loss or wt gain, classically pleuritic or exertional cp,  orthopnea pnd or arm/hand swelling  or leg swelling, presyncope, palpitations, abdominal pain, anorexia, nausea, vomiting, diarrhea  or change in bowel habits or change in bladder habits, change in stools or change in urine, dysuria, hematuria,  rash, arthralgias, visual complaints, headache, numbness, weakness or ataxia or problems with walking or coordination,  change in mood or  memory. ?      ? ?Current Meds  ?Medication Sig  ? albuterol (VENTOLIN HFA) 108 (90 Base) MCG/ACT inhaler Inhale 2 puffs into the lungs every 6 (six) hours as needed for wheezing or shortness of breath.  ? Baclofen 5 MG TABS Take 1 tablet by mouth 3 (three) times daily as needed for muscle spasms.  ? gabapentin (NEURONTIN) 100 MG capsule TAKE 1 CAPSULE BY MOUTH THREE TIMES DAILY  ? guaiFENesin-codeine 100-10 MG/5ML syrup Take 5 mLs by mouth every 6 (six) hours.  ? pantoprazole (PROTONIX) 20 MG tablet Take 1 tablet (20 mg total) by mouth daily.  ? rosuvastatin (CRESTOR) 20 MG tablet Take 1 tablet (20 mg total) by mouth daily.  ?    ? ?  ? ? ? ?Objective:  ?  ?Wts ? ? ?08/29/2021       165  ?02/09/2021       166  ?10/09/2020         163 ?07/20/2020       159  ? 06/22/2020      160  ?05/25/2020       157 ?02/14/2020     158 ?01/17/2020       157 ?10/11/2019         155  ?03/23/2019     164   ?12/15/2018       162  ?10/27/2018       164  ?09/01/2018  160  ?08/04/18 158 lb (71.7 kg)  ?05/29/18 156 lb 6.4 oz (70.9 kg)  ?03/20/18 159 lb (72.1 kg)  ?  ?Vital signs reviewed  08/29/2021  - Note at rest 02 sats  95% on RA  ? ?General appearance:    amb middle aged [place] nad   ? ? HEENT : snl exam   ? ? ?NECK :  without JVD/Nodes/TM/ nl carotid upstrokes bilaterally ? ? ?LUNGS: no acc muscle use,  Nl contour chest with insp crackles in bases  bilaterally without cough on insp or exp maneuvers ? ? ?CV:  RRR  no s3 or murmur or increase in P2, and no edema  ? ?ABD:  soft and nontender with nl inspiratory excursion in the supine position. No bruits or organomegaly appreciated, bowel sounds nl ? ?MS:  Nl gait/ ext warm without deformities, calf tenderness, cyanosis  - Mod  clubbing ?No obvious joint restrictions  ? ?SKIN: warm and dry without lesions   ? ?NEURO:  alert, approp, nl sensorium with  no motor or cerebellar deficits apparent.  ? ? ?I personally reviewed images and agree with radiology impression as follows:  ? Chest CTa 07/07/21 ?1. No evidence of a pulmonary embolism. ?2. Lungs demonstrate heterogeneous areas of interstitial thickening ?with intervening ground-glass opacities, more prominent in the lower ?lungs, which have progressed compared to the prior chest CT from ?03/27/2020. Findings are consistent with progressive interstitial ?lung disease. Superimposed infection is not excluded. No convincing ?  ?  ? ?   ?  ?  ?   ?Assessment  ? ?  ? ? ?  ?  ?  ? ?  ?  ?  ? ?  ?  ?

## 2021-08-29 NOTE — Patient Instructions (Addendum)
Prednisone 10 mg per day is the floor and if worse then prednisone 20 mg until better then back to 10 mg  ? ?Try increase gabapentin 100 mg x 2 in and then 100 mg lunch and supper  ? ? ?Please schedule a follow up visit in 3 months but call sooner if needed  ? ? ? ? ?Instrucciones ? ?Prednisona 10 mg por d?a es el piso y si empeora entonces prednisona 20 mg hasta que mejore luego de vuelta a 10 mg ? ?Intente aumentar la gabapentina 100 mg x 2 en y luego 100 mg en el almuerzo y la cena ? ? ?Programe una visita de seguimiento en 3 meses, pero llame antes si es necesario ?

## 2021-08-29 NOTE — Progress Notes (Signed)
Internal Medicine Clinic Attending ? ?Case discussed with Dr. Dean  At the time of the visit.  We reviewed the resident?s history and exam and pertinent patient test results.  I agree with the assessment, diagnosis, and plan of care documented in the resident?s note.  ?

## 2021-08-30 ENCOUNTER — Encounter: Payer: Self-pay | Admitting: Internal Medicine

## 2021-08-30 NOTE — Assessment & Plan Note (Signed)
Onset Aug 2019  ?neg resp to rx with short term pred/ flovent or saba  ?-  08/04/2018 trial of max rx for gerd plus pred 20 until better then 10 mg daily as occurring in setting of PF w/u in progress  ?- 09/01/2018   try dulera 100 2bid  ?- 09/01/2018 added gabapentin 100 mg tid  ?- 09/29/2018 still throat clearing /cough > rec 300 mg tid trial > could only tolerate 300 mg qhs as of 10/27/2018  ?- 10/27/2018  After extensive coaching inhaler device,  effectiveness =    25% so needs spacer / recoaching  ?- 12/15/2018  After extensive coaching inhaler device,  effectiveness =    75% with spacer ?- 03/23/2019  After extensive coaching inhaler device,  effectiveness =    90% so try dulera 100 one bid   ?- worse p covid 19 infection 04/2019 but did not req rx  ?- 10/11/2019 resume gabapentin titrate up to 300 mg bid > unable to use more than 200 mg hs ?- 01/17/2020 advised rechallenge with 100 mg in am added to 200 mg hs and stop dulera ?- 02/14/2020 well controlled on just 100 mg q am   ?- 05/25/2020 advised to incrase back up to 100 mg qid or whatever dose she can tolerate to control the cough plus max gerd rx > did not do  By 06/22/2020 > rec try again by starting with 100 mg tid (misunderstood and took 200 mg at a time and "got too sleepy") ?- 07/20/2020 on gabapentin 100 mg tid cough ok control s adverse side effects ? ?>>> 08/29/2021 since am cough the worst rec gabapentin 200 mg in am and '100mg'$  lunch/ supper  ?

## 2021-08-30 NOTE — Assessment & Plan Note (Signed)
Onset of symptoms Aug 2019 assoc with clubbing  ?Chest HRCT  06/18/2018 ? highly limited by respiratory motion. With these ?limitations in mind, there is evidence suggestive of interstitial ?lung disease considered indeterminate for usual interstitial ?pneumonia (UIP)   ?- 08/04/2018   Walked RA  2 laps @  approx 210f each @ fast pace  stopped due to  End of study with sob and sats down to 82% - see ex hypoxemia ?- Collagen Vasc profile 08/04/2018  Nl RA, ANA, ESR=24  ?- HSP serology 09/01/2018  Neg  ?- on pred 5 mg /day :  12/15/2018   Walked RA  2 laps @  approx 2572feach @ avg pace  stopped due to  End of study, min sob, sats 93% at end   ?- 03/23/2019   Walked RA x one lap =  approx 250 ft @ nl pace - stopped due to end of study with sats of 96% at the end of the study and minimal sob on prednisone 5 mg qd so rec qod ?- Pos Covid testing 04/07/2019  No rx   ?- Stopped prednisone around 05/07/2019 ?- 07/09/19 HRCT ?Very similar findings in the lungs, albeit with slight ?improvement compared to the prior examination, as detailed above. ?Although the pattern is technically characterized as indeterminate ?for usual interstitial pneumonia (UIP), given the slight regression ?compared to the prior study, UIP is considered unlikely. The primary ?differential consideration is nonspecific interstitial pneumonia ?(NSIP).  ? -  10/11/2019   Walked RA  2 laps @ approx 25064fach @ moderate pace  stopped due to end of study,   no sob - sats 93% at end  ?- Flare on prednisone 5 mg qod in November 2021 req 40 mg daily with improvement x 14 days so maint on 20 mg daily  ?-  05/25/2020   Walked RA  2 laps @ approx 250f43fch @ fast pace  stopped due to end of study, no sob and sats still 90% at end  So rec slow taper to 5 mg qod if tolerates monitoring ex sats daily  ?-  07/20/2020   Walked RA  2 laps @ approx 250ft51fh @ fast pace  stopped due to end of study sats 89% at end , min sob  @ 10 mg pred/day  ? ? ?Continues to demonstrate  steroid responsive ILD c/w NSIP ? Etiology and typically maintained on relatively low doses ? ?The goal with a chronic steroid dependent illness is always arriving at the lowest effective dose that controls the disease/symptoms and not accepting a set "formula" which is based on statistics or guidelines that don't always take into account patient  variability or the natural hx of the dz in every individual patient, which may well vary over time.  For now therefore I recommend the patient maintain  Floor of 10 mg and ceiling of 20 mg  see avs for instructions unique to this ov ?  ? ?

## 2021-08-30 NOTE — Assessment & Plan Note (Signed)
08/04/2018 desat at fast pace p 500 ft down to 82  ?- 09/01/2018   Walked RA  2 laps @  approx 286f each @ fast pace  stopped due to  End of study, sat 89% at end on pred 20 mg daily   ?- 09/29/2018   Walked RA  2 laps @  approx 2542feach @ fast  pace  stopped due to  End of study, sats 89% / min sob on prednisone 10 mg daily    ?- 10/27/2018   Walked RA  2 laps @  approx 25067fach @ nl  pace  stopped due to  End of study, sats 89%, min sob on pred 10 mg daily  ?- d/c 02 10/09/2020 and ordered ono RA > done 12/13/20 desats x 10 min @ < 89% so ok to leave off  ? ?Advised  ?Make sure you check your oxygen saturation at your highest level of activity to be sure it stays over 90% and keep track of it at least once a week, more often if breathing getting worse, and let me know if losing ground > if so double prednisone until better then back to baseline ? ?    ?  ? ?Each maintenance medication was reviewed in detail including emphasizing most importantly the difference between maintenance and prns and under what circumstances the prns are to be triggered using an action plan format where appropriate. ? ?Total time for H and P, chart review, counseling, reviewing hfa device(s) and generating customized AVS unique to this office visit / same day charting > 30 min ?     ?

## 2021-09-10 ENCOUNTER — Telehealth: Payer: Self-pay | Admitting: Internal Medicine

## 2021-09-10 MED ORDER — GABAPENTIN 100 MG PO CAPS
ORAL_CAPSULE | ORAL | 3 refills | Status: DC
Start: 2021-09-10 — End: 2021-11-27

## 2021-09-10 NOTE — Telephone Encounter (Signed)
Rx for gabapentin has been sent to preferred pharm ?

## 2021-09-13 ENCOUNTER — Other Ambulatory Visit (HOSPITAL_COMMUNITY): Payer: Self-pay

## 2021-10-08 ENCOUNTER — Other Ambulatory Visit: Payer: Self-pay

## 2021-10-18 ENCOUNTER — Other Ambulatory Visit (HOSPITAL_COMMUNITY): Payer: Self-pay

## 2021-10-31 NOTE — Telephone Encounter (Signed)
Seems like encounter was open in error so closing encounter.  

## 2021-11-02 ENCOUNTER — Other Ambulatory Visit (HOSPITAL_COMMUNITY): Payer: Self-pay

## 2021-11-02 ENCOUNTER — Telehealth: Payer: Self-pay | Admitting: *Deleted

## 2021-11-02 NOTE — Telephone Encounter (Signed)
Patient called in via [place], Delice Lesch. States patient is having difficulty breathing and it is an emergency. Patient is advised to head directly to UC or ED. Refuses to go to ED but states she will go to UC. She also requested an appt at [facility] for next week. Transferred back to FO to schedule.

## 2021-11-05 ENCOUNTER — Other Ambulatory Visit (HOSPITAL_COMMUNITY): Payer: Self-pay

## 2021-11-05 ENCOUNTER — Encounter: Payer: Self-pay | Admitting: Student

## 2021-11-05 ENCOUNTER — Other Ambulatory Visit: Payer: Self-pay

## 2021-11-05 ENCOUNTER — Ambulatory Visit (INDEPENDENT_AMBULATORY_CARE_PROVIDER_SITE_OTHER): Payer: Self-pay | Admitting: Student

## 2021-11-05 VITALS — BP 139/97 | HR 109 | Temp 98.7°F | Wt 165.8 lb

## 2021-11-05 DIAGNOSIS — J069 Acute upper respiratory infection, unspecified: Secondary | ICD-10-CM | POA: Insufficient documentation

## 2021-11-05 DIAGNOSIS — R0781 Pleurodynia: Secondary | ICD-10-CM

## 2021-11-05 DIAGNOSIS — R195 Other fecal abnormalities: Secondary | ICD-10-CM | POA: Insufficient documentation

## 2021-11-05 MED ORDER — GUAIFENESIN ER 600 MG PO TB12
600.0000 mg | ORAL_TABLET | Freq: Two times a day (BID) | ORAL | 0 refills | Status: AC
  Filled 2021-11-05: qty 20, 10d supply, fill #0

## 2021-11-05 MED ORDER — BENZONATATE 100 MG PO CAPS
100.0000 mg | ORAL_CAPSULE | Freq: Four times a day (QID) | ORAL | 1 refills | Status: DC | PRN
  Filled 2021-11-05: qty 30, 8d supply, fill #0

## 2021-11-05 NOTE — Assessment & Plan Note (Signed)
Patient had a positive fit test in march 2023.  Plan: -Referred out to Outpatient Gastroenterologist

## 2021-11-05 NOTE — Patient Instructions (Signed)
Olivia [name], Olivia [name] por permitirnos brindarle atencin hoy. hoy discutimos  1. Por favor, tome el amarillo para la tos. 2. Tome mucinex para la congestin. 3. Pregntele al farmacutico por direcciones en espaol 4. Espere la llamada telefnica del mdico del estmago para la colonoscopia 5.seguimiento en una semana     He ordenado los siguientes laboratorios para usted:  Lab Orders         Coronavirus ([id number]) with Influenza A and Influenza B       Pruebas ordenadas hoy:  Referencias ordenadas hoy:  Referral Orders         Ambulatory referral to Gastroenterology      He ordenado el siguiente medicamento/cambiado los siguientes medicamentos:  Suspender los siguientes medicamentos: There are no discontinued medications.   Iniciar los siguientes medicamentos: Meds ordered this encounter  Medications   guaiFENesin (MUCINEX) 600 MG 12 hr tablet    Sig: Take 1 tablet (600 mg total) by mouth 2 (two) times daily for 10 days.    Dispense:  20 tablet    Refill:  0   benzonatate (TESSALON PERLES) 100 MG capsule    Sig: Take 1 capsule (100 mg total) by mouth every 6 (six) hours as needed for cough.    Dispense:  30 capsule    Refill:  1     Seguimiento  1 Semana      Si tiene alguna pregunta o inquietud, llame a la clnica de medicina interna al [phone].    Leigh Aurora  D.O. [place]

## 2021-11-05 NOTE — Assessment & Plan Note (Signed)
Given the history, it is likely that the patient may have caught a viral illness from her granddaughter.   Plan: -Obtain COVID and flu swabs  -Benzonatate 100 mg every 6 hours as needed for cough -Mucinex 600 mg BID for congestion  -Follow up in 1 week

## 2021-11-05 NOTE — Assessment & Plan Note (Signed)
Patient noted that she has been having right rib pain for the last week. She notes that in the past she has used lidocaine patches for the pain. This pain is likely coming from the cough that she is having.   Plan: -Patient to continue to get lidocaine patches for this pain

## 2021-11-05 NOTE — Progress Notes (Signed)
CC: Cough with phlegm   HPI:  Olivia [name] is a 56 y.o. female with  a past medical history of interstitial lung disease, GERD, Peripheral vascular diease, and depression presents to the clinic with concerns for a week long cough. She notes that she has been having this productive cough for the past week. She reports that she was in the hospital back in 07/2021 where she was diagnosed with this interstitial lung diease and was discharged with albuterol, prednisone, home O2, and cough syrup. She reports that last week 10/28/21, she saw her grand daughter who was sick with some viral infection. Since then, the patient reports that she has been having this productive cough causing her to use her albuterol more. She also notes that she ran out of her cough syrup on last week on 10/29/21. Patient notes that she has a headache at times and some fatigue, but denies any nasal congestion, appetite change, sore throat or ear pain. She notes that for the last week she has also has developed some right rib pain which gets worse with movement. Patient also informs me that she would like to get her colonoscopy done.  History obtained from in person interpretor (spanish)   Current Outpatient Medications:    benzonatate (TESSALON PERLES) 100 MG capsule, Take 1 capsule (100 mg total) by mouth every 6 (six) hours as needed for cough., Disp: 30 capsule, Rfl: 1   guaiFENesin (MUCINEX) 600 MG 12 hr tablet, Take 1 tablet (600 mg total) by mouth 2 (two) times daily for 10 days., Disp: 20 tablet, Rfl: 0   albuterol (VENTOLIN HFA) 108 (90 Base) MCG/ACT inhaler, Inhale 2 puffs into the lungs every 6 (six) hours as needed for wheezing or shortness of breath., Disp: 18 g, Rfl: 5   Baclofen 5 MG TABS, Take 1 tablet by mouth 3 (three) times daily as needed for muscle spasms., Disp: 30 each, Rfl: 0   gabapentin (NEURONTIN) 100 MG capsule, Take 2 capsules (200 mg total) by mouth daily with breakfast AND 1 capsule  (100 mg total) daily with lunch AND 1 capsule (100 mg total) daily with supper., Disp: 120 capsule, Rfl: 1   gabapentin (NEURONTIN) 100 MG capsule, 2 every am, then 1 at lunch and 1 with supper, Disp: 120 capsule, Rfl: 3   guaiFENesin-codeine 100-10 MG/5ML syrup, Take 5 mLs by mouth every 6 (six) hours., Disp: 120 mL, Rfl: 0   pantoprazole (PROTONIX) 20 MG tablet, Take 1 tablet (20 mg total) by mouth daily., Disp: 90 tablet, Rfl: 2   predniSONE (DELTASONE) 10 MG tablet, Take 2 tablets daily with breakfast until better then one daily, Disp: 100 tablet, Rfl: 2   rosuvastatin (CRESTOR) 20 MG tablet, Take 1 tablet (20 mg total) by mouth daily., Disp: 30 tablet, Rfl: 11   Past Medical History:  Diagnosis Date   Acute respiratory distress 03/27/2020   Elevated blood pressure reading 12/21/2018   GERD (gastroesophageal reflux disease)    Healthcare maintenance 06/28/2019   Per patient's recent discharge summary, FOBT positive in ED. Given her overall prognosis and health condition, will hold off discussing colonoscopy.   History of COVID-19 09/02/2019   05/03/19 - Sarscov2 - positive  Did not get mab  Did not get hosp   Preventative health care 01/15/2013   Pulmonary fibrosis [facility])    Screening breast examination 11/26/2018   Review of Systems:   Constitutional:Patient denies any fever or chills  HENT: Patient reports headache but denies any sore  throat, runny nose, nasal congestion, or ear pain  Respiratory: Patient endorses productive cough MSK: Patient endorses Right rib pain which becomes worse with any sidebending     Physical Exam:  Vitals:   11/05/21 1338  BP: (!) 139/97  Pulse: (!) 109  Temp: 98.7 F (37.1 C)  TempSrc: Oral  SpO2: 98%  Weight: 165 lb 12.8 oz (75.2 kg)   General: Alert and orientated x3. Patient is sitting comfortably in the room on 2L O2 nasal canula.  Eyes: EOM intact  Head: Normocephalic, atraumatic  Cardio: Regular rate and rhythm, no murmurs, rubs or  gallops. 2+ pulses to bilateral upper and lower extremities  Chest: Right rib angle with tenderness to palpation, no bruising noted  Pulmonary: Bilateral lung bases with crackles noted Back: No midline tenderness, no step off or deformities noted. No paraspinal muscle tenderness.     Assessment & Plan:   Positive FIT (fecal immunochemical test) Patient had a positive fit test in march 2023.  Plan: -Referred out to Outpatient Gastroenterologist   Upper respiratory infection Given the history, it is likely that the patient may have caught a viral illness from her granddaughter.   Plan: -Obtain COVID and flu swabs  -Benzonatate 100 mg every 6 hours as needed for cough -Mucinex 600 mg BID for congestion  -Follow up in 1 week  Rib pain on right side Patient noted that she has been having right rib pain for the last week. She notes that in the past she has used lidocaine patches for the pain. This pain is likely coming from the cough that she is having.   Plan: -Patient to continue to get lidocaine patches for this pain    Patient seen with Dr.  Wyn Forster, DO PGY-1 Internal Medicine Resident

## 2021-11-07 LAB — COVID-19, FLU A+B NAA
Influenza A, NAA: NOT DETECTED
Influenza B, NAA: NOT DETECTED
SARS-CoV-2, NAA: NOT DETECTED

## 2021-11-07 NOTE — Progress Notes (Signed)
Internal Medicine Clinic Attending  I saw and evaluated the patient.  I personally confirmed the key portions of the history and exam documented by Dr. Posey Pronto and I reviewed pertinent patient test results.  The assessment, diagnosis, and plan were formulated together and I agree with the documentation in the resident's note.    56yo woman with NSIP on chronic prednisone '10mg'$  daily who presents with 1 week of cough in the setting of being exposed to her granddaughter, who had similar symptoms last week. She has not had any fevers, chest pain, or lightheadedness. She has home O2 which she normally does not use, but she's used it a few times at night and when coming to this clinic appointment this week.   COVID & flu negative.  Patient would prefer not to be on higher-dose steroids if possible, so we will give this a 1-week trial of treating symptomatically, with very close follow-up. If she is still symptomatic at 7/10 appointment, I would recommend increasing her steroids (ex- Pred '20mg'$  daily) until she feels better, then returning back down to regular '10mg'$  daily dose.   She is established with Pulm, saw them in April, and I think is due to return. Please remind patient to schedule an appointment with Pulm at her next [facility] visit.

## 2021-11-08 NOTE — Addendum Note (Signed)
Addended by: Truddie Crumble on: 11/08/2021 12:17 PM   Modules accepted: Orders

## 2021-11-12 ENCOUNTER — Ambulatory Visit (INDEPENDENT_AMBULATORY_CARE_PROVIDER_SITE_OTHER): Payer: Self-pay | Admitting: Student

## 2021-11-12 ENCOUNTER — Ambulatory Visit (HOSPITAL_COMMUNITY)
Admission: RE | Admit: 2021-11-12 | Discharge: 2021-11-12 | Disposition: A | Discharge status: Home / Self Care | Payer: No Typology Code available for payment source | Source: Ambulatory Visit | Attending: Internal Medicine | Admitting: Internal Medicine

## 2021-11-12 ENCOUNTER — Encounter: Payer: Self-pay | Admitting: Student

## 2021-11-12 ENCOUNTER — Other Ambulatory Visit (HOSPITAL_COMMUNITY): Payer: Self-pay

## 2021-11-12 VITALS — BP 137/87 | HR 99 | Temp 98.4°F | Ht <= 58 in | Wt 165.8 lb

## 2021-11-12 DIAGNOSIS — J069 Acute upper respiratory infection, unspecified: Secondary | ICD-10-CM | POA: Insufficient documentation

## 2021-11-12 DIAGNOSIS — R0781 Pleurodynia: Secondary | ICD-10-CM

## 2021-11-12 MED ORDER — HYDROCOD POLI-CHLORPHE POLI ER 10-8 MG/5ML PO SUER: 5.0000 mL | Freq: Two times a day (BID) | ORAL | 0 refills | Status: DC | PRN

## 2021-11-12 MED ORDER — SENNOSIDES-DOCUSATE SODIUM 8.6-50 MG PO TABS
1.0000 | ORAL_TABLET | Freq: Every day | ORAL | 0 refills | Status: DC
  Filled 2021-11-12: qty 30, 30d supply, fill #0

## 2021-11-12 NOTE — Assessment & Plan Note (Signed)
Patient states that she is still having this musculoskeletal right rib pain.  On exam it is tender to touch.  Any sidebending or rotation reproduces this pain.  She states that lidocaine patches helps this pain, and she states that she will use lidocaine patches.  Last time I instructed her to use lidocaine patches and she states that she forgot to pick them up.  Plan: - Pain seems to be musculoskeletal in nature, use lidocaine patches as indicated.

## 2021-11-12 NOTE — Assessment & Plan Note (Addendum)
Patient was seen here last week for this cough.  She was given benzonatate and Mucinex and instructed to follow-up if symptoms did not improve.  Patient returns today with concerns of cough again.  She also reports having a clear sputum.  She denies any changes in color to her sputum.  Patient reports that she has been using her benzonatate but reports it is not working.  She states that the Mucinex did help her. Given history of exposure to sick contact, I am leaning towards this being a postviral cough.  Plan: - Obtain chest x-ray given that the cough has still not gone away.  Try to rule out any kind of walking pneumonia. - Prescribed patient Tussionex for cough - Tussionex can make the patient constipated, and therefore I have written senna docusate for this patient given that she is allergic to MiraLAX.

## 2021-11-12 NOTE — Progress Notes (Signed)
CC: Cough  HPI:  [name] is a 56 y.o. who presents to the clinic with concerns of a cough.  Patient states that she has been having this cough for the past few months and has recently gotten worse in the past 2 weeks.  Patient was seen here last week for the same cough.  Patient was given Mucinex and benzonatate for this.  She states that she is still feeling the same.  She also reports having shortness of breath.  She states that she feels a globus sensation and a scratchy sensation in her throat which makes her cough more.  Patient also reports having this right-sided rib pain which is worsened with cough.  She reports instructed to get lidocaine patches for this right rib pain on the last visit, but she states that she was not able to pick them up because she forgot.  Past Medical History:  Diagnosis Date   Acute respiratory distress 03/27/2020   Elevated blood pressure reading 12/21/2018   GERD (gastroesophageal reflux disease)    Healthcare maintenance 06/28/2019   Per patient's recent discharge summary, FOBT positive in ED. Given her overall prognosis and health condition, will hold off discussing colonoscopy.   History of COVID-19 09/02/2019   05/03/19 - [facility] - positive  Did not get mab  Did not get hosp   Preventative health care 01/15/2013   Pulmonary fibrosis ([place])    Screening breast examination 11/26/2018     Current Outpatient Medications:    senna-docusate (SENOKOT-S) 8.6-50 MG tablet, Take 1 tablet by mouth daily., Disp: 30 tablet, Rfl: 0   albuterol (VENTOLIN HFA) 108 (90 Base) MCG/ACT inhaler, Inhale 2 puffs into the lungs every 6 (six) hours as needed for wheezing or shortness of breath., Disp: 18 g, Rfl: 5   Baclofen 5 MG TABS, Take 1 tablet by mouth 3 (three) times daily as needed for muscle spasms., Disp: 30 each, Rfl: 0   benzonatate (TESSALON PERLES) 100 MG capsule, Take 1 capsule (100 mg total) by mouth every 6 (six) hours as needed for cough.,  Disp: 30 capsule, Rfl: 1   chlorpheniramine-HYDROcodone (TUSSIONEX PENNKINETIC ER) 10-8 MG/5ML, Take 5 mLs by mouth every 12 (twelve) hours as needed for cough., Disp: 30 mL, Rfl: 0   gabapentin (NEURONTIN) 100 MG capsule, Take 2 capsules (200 mg total) by mouth daily with breakfast AND 1 capsule (100 mg total) daily with lunch AND 1 capsule (100 mg total) daily with supper., Disp: 120 capsule, Rfl: 1   gabapentin (NEURONTIN) 100 MG capsule, 2 every am, then 1 at lunch and 1 with supper, Disp: 120 capsule, Rfl: 3   guaiFENesin (MUCINEX) 600 MG 12 hr tablet, Take 1 tablet (600 mg total) by mouth 2 (two) times daily for 10 days., Disp: 20 tablet, Rfl: 0   guaiFENesin-codeine 100-10 MG/5ML syrup, Take 5 mLs by mouth every 6 (six) hours., Disp: 120 mL, Rfl: 0   pantoprazole (PROTONIX) 20 MG tablet, Take 1 tablet (20 mg total) by mouth daily., Disp: 90 tablet, Rfl: 2   predniSONE (DELTASONE) 10 MG tablet, Take 2 tablets daily with breakfast until better then one daily, Disp: 100 tablet, Rfl: 2   rosuvastatin (CRESTOR) 20 MG tablet, Take 1 tablet (20 mg total) by mouth daily., Disp: 30 tablet, Rfl: 11  Review of Systems:   Respiratory: Shortness of breath MSK: Right rib pain   Physical Exam:  Vitals:   11/12/21 1330  BP: 137/87  Pulse: 99  Temp: 98.4  F (36.9 C)  TempSrc: Oral  SpO2: 100%  Weight: 165 lb 12.8 oz (75.2 kg)  Height: '4\' 9"'$  (1.448 m)    General: Alert and orientated x3. Patient is sitting comfortably in the room with nasal cannula on 2L O2 Eyes: EOM intact  Head: Normocephalic, atraumatic  Cardio: Regular rate and rhythm, no murmurs, rubs or gallops. 2+ pulses to bilateral upper and lower extremities  Chest: Right rib 4-5 tenderness at intercostal space Pulmonary: Bilateral crackles noted to bases of lungs   Assessment & Plan:   Upper respiratory infection Patient was seen here last week for this cough.  She was given benzonatate and Mucinex and instructed to follow-up  if symptoms did not improve.  Patient returns today with concerns of cough again.  She also reports having a clear sputum.  She denies any changes in color to her sputum.  Patient reports that she has been using her benzonatate but reports it is not working.  She states that the Mucinex did help her. Given history of exposure to sick contact, I am leaning towards this being a postviral cough.  Plan: - Chest x-ray negative for any pneumonia or pleural effusion.  Chest x-ray only showing chronic interstitial disease which is stable from previous chest x-ray in 07/2021 - Prescribed patient Tussionex for cough - Tussionex can make the patient constipated, and therefore I have written senna docusate for this patient given that she is allergic to MiraLAX.  Rib pain on right side Patient states that she is still having this musculoskeletal right rib pain.  On exam it is tender to touch.  Any sidebending or rotation reproduces this pain.  She states that lidocaine patches helps this pain, and she states that she will use lidocaine patches.  Last time I instructed her to use lidocaine patches and she states that she forgot to pick them up.  Plan: - Pain seems to be musculoskeletal in nature, use lidocaine patches as indicated.   Patient seen with Dr. Veneda Melter, DO PGY-1 Internal Medicine Resident  Pager: [phone]

## 2021-11-12 NOTE — Patient Instructions (Addendum)
Olivia Pert, [name] por permitirnos brindarle atencin hoy. hoy discutimos  He ordenado los siguientes laboratorios para usted:  Lab Orders  No laboratory test(s) ordered today     Pruebas ordenadas hoy:  Referencias ordenadas hoy:  Referral Orders  No referral(s) requested today     He ordenado el siguiente medicamento/cambiado los siguientes medicamentos:  Suspender los siguientes medicamentos: There are no discontinued medications.   Iniciar los siguientes medicamentos: No orders of the defined types were placed in this encounter.    Seguimiento 2-3 months    Recordar:  Gracias por permitirme participar en su cuidado hoy. Esto es lo que discutimos hoy:  1. Por favor, tome el nuevo jarabe para la tos [employer]  2. Por favor, termine su radiografa. Te llamar con [employer]  3. Por favor, obtenga sus parches de lidocana para su dolor en la costilla derecha.    Si tiene alguna pregunta o inquietud, llame a la clnica de medicina interna al [phone].     Leigh Aurora, D.O. [place]

## 2021-11-13 ENCOUNTER — Telehealth: Payer: Self-pay | Admitting: Student

## 2021-11-13 ENCOUNTER — Other Ambulatory Visit (HOSPITAL_COMMUNITY): Payer: Self-pay

## 2021-11-13 MED ORDER — HYDROCOD POLI-CHLORPHE POLI ER 10-8 MG/5ML PO SUER
5.0000 mL | Freq: Two times a day (BID) | ORAL | 0 refills | Status: DC | PRN
  Filled 2021-11-13: qty 30, 3d supply, fill #0

## 2021-11-13 NOTE — Addendum Note (Signed)
Addended by: Riesa Pope on: 11/13/2021 08:17 AM   Modules accepted: Orders

## 2021-11-13 NOTE — Telephone Encounter (Addendum)
I spoke to the patient on the phone regarding her x-ray results today.  Patient is understanding of the results.  She also had some questions about her medications.  I help her understand what medications she is supposed to be taking and which ones she is not supposed to be taking.  I tell her to follow-up with her pulmonologist on 07/25.  I used an interpreter for this.

## 2021-11-14 ENCOUNTER — Other Ambulatory Visit (HOSPITAL_COMMUNITY): Payer: Self-pay

## 2021-11-14 NOTE — Progress Notes (Signed)
Internal Medicine Clinic Attending  I saw and evaluated the patient.  I personally confirmed the key portions of the history and exam documented by Dr. Patel and I reviewed pertinent patient test results.  The assessment, diagnosis, and plan were formulated together and I agree with the documentation in the resident's note.  

## 2021-11-27 ENCOUNTER — Encounter: Payer: Self-pay | Admitting: Internal Medicine

## 2021-11-27 ENCOUNTER — Other Ambulatory Visit (HOSPITAL_COMMUNITY): Payer: Self-pay

## 2021-11-27 ENCOUNTER — Ambulatory Visit (INDEPENDENT_AMBULATORY_CARE_PROVIDER_SITE_OTHER): Payer: Self-pay | Admitting: Internal Medicine

## 2021-11-27 ENCOUNTER — Telehealth: Payer: Self-pay | Admitting: Internal Medicine

## 2021-11-27 DIAGNOSIS — R053 Chronic cough: Secondary | ICD-10-CM

## 2021-11-27 DIAGNOSIS — R0902 Hypoxemia: Secondary | ICD-10-CM

## 2021-11-27 DIAGNOSIS — J8489 Other specified interstitial pulmonary diseases: Secondary | ICD-10-CM

## 2021-11-27 MED ORDER — GABAPENTIN 300 MG PO CAPS
300.0000 mg | ORAL_CAPSULE | Freq: Two times a day (BID) | ORAL | 2 refills | Status: DC
  Filled 2021-11-27: qty 90, 45d supply, fill #0
  Filled 2022-01-21: qty 90, 45d supply, fill #1
  Filled 2022-07-31: qty 90, 45d supply, fill #2

## 2021-11-27 NOTE — Assessment & Plan Note (Addendum)
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 With these limitations in mind, there is evidence suggestive of interstitial lung disease considered indeterminate for usual interstitial pneumonia (UIP)   - 08/04/2018   Walked RA  2 laps @  approx 279f each @ fast pace  stopped due to  End of study with sob and sats down to 82% - see ex hypoxemia - Collagen Vasc profile 08/04/2018  Nl RA, ANA, ESR=24  - HSP serology 09/01/2018  Neg  - on pred 5 mg /day :  12/15/2018   Walked RA  2 laps @  approx 2551feach @ avg pace  stopped due to  End of study, min sob, sats 93% at end   - 03/23/2019   Walked RA x one lap =  approx 250 ft @ nl pace - stopped due to end of study with sats of 96% at the end of the study and minimal sob on prednisone 5 mg qd so rec qod - Pos Covid testing 04/07/2019  No rx   - Stopped prednisone around 05/07/2019 - 07/09/19 HRCT Very similar findings in the lungs, albeit with slight improvement compared to the prior examination, as detailed above. Although the pattern is technically characterized as indeterminate for usual interstitial pneumonia (UIP), given the slight regression compared to the prior study, UIP is considered unlikely. The primary differential consideration is nonspecific interstitial pneumonia (NSIP).   -  10/11/2019   Walked RA  2 laps @ approx 25067fach @ moderate pace  stopped due to end of study,   no sob - sats 93% at end  - Flare on prednisone 5 mg qod in November 2021 req 40 mg daily with improvement x 14 days so maint on 20 mg daily  -  05/25/2020   Walked RA  2 laps @ approx 250f12fch @ fast pace  stopped due to end of study, no sob and sats still 90% at end  So rec slow taper to 5 mg qod if tolerates monitoring ex sats daily  -  07/20/2020   Walked RA  2 laps @ approx 250ft9fh @ fast pace  stopped due to end of study sats 89% at end , min sob  @ 10 mg pred/day  -  11/27/2021   Walked on 2lpm   x   3  lap(s) =  approx 750  ft  @ mod pace, stopped due to end of study  with lowest 02 sats 90%  @ 10 mg pred /day   She has def lost ground since march on 10 mg pred /day so rec 20 mg daily (using the lowest effect dose strategy for now)  and refer to PF clinic  Discussed in detail all the  indications, usual  risks and alternatives  relative to the benefits with patient who agrees to proceed with Rx as outlined.

## 2021-11-27 NOTE — Patient Instructions (Addendum)
Prednisone 10 mg  - 2 daily until breathing / coughing are better then one daily   Make sure you check your oxygen saturation  AT  your highest level of activity (not after you stop)   to be sure it stays over 90% and adjust  02 flow upward to maintain this level if needed but remember to turn it back to previous settings when you stop (to conserve your supply).   Increase gabapentin to 300 mg twice daily x one week then one three times daily   Refer to pulmonary fibrosis clinic  Add increase prilosec to Take 30- 60 min before your first and last meals of the day

## 2021-11-27 NOTE — Telephone Encounter (Signed)
Omeprazole should be Take 30- 60 min before your first and last meals of the day

## 2021-11-27 NOTE — Assessment & Plan Note (Signed)
Onset Aug 2019  neg resp to rx with short term pred/ flovent or saba  -  08/04/2018 trial of max rx for gerd plus pred 20 until better then 10 mg daily as occurring in setting of PF w/u in progress  - 09/01/2018   try dulera 100 2bid  - 09/01/2018 added gabapentin 100 mg tid  - 09/29/2018 still throat clearing /cough > rec 300 mg tid trial > could only tolerate 300 mg qhs as of 10/27/2018  - 10/27/2018  After extensive coaching inhaler device,  effectiveness =    25% so needs spacer / recoaching  - 12/15/2018  After extensive coaching inhaler device,  effectiveness =    75% with spacer - 03/23/2019  After extensive coaching inhaler device,  effectiveness =    90% so try dulera 100 one bid   - worse p covid 19 infection 04/2019 but did not req rx  - 10/11/2019 resume gabapentin titrate up to 300 mg bid > unable to use more than 200 mg hs - 01/17/2020 advised rechallenge with 100 mg in am added to 200 mg hs and stop dulera - 02/14/2020 well controlled on just 100 mg q am   - 05/25/2020 advised to incrase back up to 100 mg qid or whatever dose she can tolerate to control the cough plus max gerd rx > did not do  By 06/22/2020 > rec try again by starting with 100 mg tid (misunderstood and took 200 mg at a time and "got too sleepy") - 07/20/2020 on gabapentin 100 mg tid cough ok control s adverse side effects - 08/29/2021 since am cough the worst rec gabapentin 200 mg in am and '100mg'$  lunch/ supper  - 11/27/2021 increase gabapentin to 300 mg bid x one week then tid with increase  ppi to bid ac   Cough not typical for PF in that not really reproducible at this point with inspiration so best bet is max gerd rx and gabapentin to 300 mg tid max to suppress cylical coughing (cough> gerd > more cough > more gerd)

## 2021-11-27 NOTE — Progress Notes (Signed)
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 Olivia [name] c/o SOB since August 2019. She gets SOB walking short distances and also when she makes the bed.   Dyspnea:  Indolent onset doe summer 2019   Now [facility] = can't walk a nl pace on a flat grade s sob but does fine slow and flat  Cough: to point of vomiting / mucus is clear  Sleep: flat bed and one pillow nonoct cough  SABA use: don't help/ neither does flovent or tessalon prn  No significant arthritis but feels better/ cough less p advil  2 dogs/ no birds  No h/o rheumatologic symptoms, chemo or amiodarone or macrodantin exp. rec GERD diet  Omeprazole 20 mg Take 30- 60 min before your first and last meals of the day  Prednisone 10 mg x 2 daily until better then 1 daily until return  Please schedule a follow up office visit in 4 weeks, sooner if needed  with all medications /inhalers/ solutions in hand Please bring a family member to help interpret at this visit, thank you      10/27/2018  f/u ov/Olivia [name] re: pf/ cough variant asthma with uacs  On gabapentin 300 mg hs and still on prednisone 10 mg daily   Chief Complaint  Patient presents with   Follow-up    Cough has improved some.   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 She is using her albuterol inhaler about 3 x per wk.   Dyspnea:  No change ability to do ht s -2  Cough: dry daytime with insp Sleeping: bed is flat / sleeping 7-8 h per night  SABA use: as above  02: not using at all  Covid status:   Vaccine x 2 and infected Rec No change in medications  We will cancel the 02 today and order an ONO on Room Air >  12/13/20 desats x 10 min @ < 89% so ok to leave off   Date of Admission: 07/07/2021    Date of Discharge:   07/09/21  Discharge Diagnosis: 1. Acute on chronic respiratory failure with hypoxia; Chronic cough; Right-sided flank pain; Nonspecific Interstitial Pneumonia 2. GERD 3. Hx of depression 4. Hyperlipidemia; Mild aortic atherosclerosis    08/29/2021  f/u ov/Olivia [name] re: NSIP    maint on 10 mg /day  - note above flare occurred at 10 mg and evolved over 10 days and did not call or increase dose as previously rec  Chief Complaint  Patient presents with   Follow-up    Patient feels like she is doing good, no concerns   Dyspnea:  30 min flat surface slow pace s 02 - can't do inclines or stops- sats high 80s typically which she says is normal for her. Cough: on gabapentin 100 mg tid still sporadic harsh drry  Sleeping: flat bed / one pillow s resp cc SABA use: 1-3 x per day, not noct  02: just at hs 2lpm  Rec Prednisone 10 mg per day is the floor and if worse then prednisone 20 mg until better then back to 10 mg  Try increase gabapentin 100 mg x 2 in and then 100 mg lunch and supper     11/27/2021  f/u ov/Olivia [name] re: NSIP  maint on prednisone at 10 mg daily   Chief Complaint  Patient presents with   Follow-up    Olivia [name] states her coughing has become very bad but she went  internal meds and hospital and they gave her new meds  Dyspnea:  aisles at [facility] about the same on 2lpm but says desats 84%  Cough: worse   gabapentin 100 mg qid  Sleeping: sleeps ok one pillow and flat bed  SABA use: varies daytime only , never needs  noct  02: 2lpm does not titrate     No obvious day to day or daytime variability or assoc excess/ purulent sputum or mucus plugs or hemoptysis or cp or chest tightness, subjective wheeze or overt sinus or hb symptoms.   Sleeping  without nocturnal  or early am exacerbation  of respiratory  c/o's or need for noct saba. Also denies any obvious fluctuation of symptoms with weather or environmental changes or other aggravating or alleviating factors except as outlined above   No unusual exposure hx or h/o childhood pna/ asthma or knowledge of premature birth.  Current Allergies, Complete Past Medical History, Past Surgical History, Family History, and Social History were reviewed in [employer] record.  ROS  The following are not active complaints unless bolded Hoarseness, sore throat, dysphagia, dental problems, itching, sneezing,  nasal congestion or discharge of excess mucus or purulent secretions, ear ache,   fever, chills, sweats, unintended wt loss or wt gain, classically pleuritic or exertional cp,  orthopnea pnd or arm/hand swelling  or leg swelling, presyncope, palpitations, abdominal pain, anorexia, nausea, vomiting, diarrhea  or change in bowel habits or change in bladder habits, change in stools or change in urine, dysuria, hematuria,  rash, arthralgias, visual complaints, headache, numbness, weakness or ataxia or problems with walking or coordination,  change in mood or  memory.        Current Meds  Medication Sig   Baclofen 5 MG TABS Take 1 tablet by mouth 3 (three) times daily as needed for muscle spasms.   gabapentin (NEURONTIN) 100 MG capsule Take 2 capsules (200 mg total) by mouth daily with breakfast AND 1  capsule (100 mg total) daily with lunch AND 1 capsule (100 mg total) daily with supper.   gabapentin (NEURONTIN) 100 MG capsule 2 every am, then 1 at lunch and 1 with supper   guaiFENesin-codeine 100-10 MG/5ML syrup Take 5 mLs by mouth every 6 (six) hours.   pantoprazole (PROTONIX) 20 MG tablet  Take 1 tablet (20 mg total) by mouth daily.   predniSONE (DELTASONE) 10 MG tablet Take 2 tablets daily with breakfast until better then one daily              Objective:    Wts  11/27/2021        167  08/29/2021       165  02/09/2021       166  10/09/2020         163 07/20/2020       159   06/22/2020      160  05/25/2020       157 02/14/2020     158 01/17/2020       157 10/11/2019         155  03/23/2019     164  12/15/2018       162  10/27/2018       164  09/01/2018       160  08/04/18 158 lb (71.7 kg)  05/29/18 156 lb 6.4 oz (70.9 kg)  03/20/18 159 lb (72.1 kg)     Vital signs reviewed  11/27/2021  - Note at rest 02 sats  97% on 2lpm NP   General appearance:    amb pleasant latina/ hacking dry cough    HEENT : Oropharynx  clear      Nasal turbinates nl    NECK :  without  apparent JVD/ palpable Nodes/TM    LUNGS: no acc muscle use,  Nl contour chest with basilar crackles on insp  bilaterally without cough on insp or exp maneuvers   CV:  RRR  no s3 or murmur or increase in P2, and no edema   ABD:  soft and nontender with nl inspiratory excursion in the supine position. No bruits or organomegaly appreciated   MS:  Nl gait/ ext warm without deformities Or obvious joint restrictions  calf tenderness, cyanosis - Moderate clubbing    SKIN: warm and dry without lesions    NEURO:  alert, approp, nl sensorium with  no motor or cerebellar deficits apparent.         I personally reviewed images and agree with radiology impression as follows:  CXR:   pa and lateral 11/12/21 Chronic interstitial lung disease. Cardiomegaly.             Assessment

## 2021-11-27 NOTE — Assessment & Plan Note (Signed)
08/04/2018 desat at fast pace p 500 ft down to 82  - 09/01/2018   Walked RA  2 laps @  approx 24f each @ fast pace  stopped due to  End of study, sat 89% at end on pred 20 mg daily   - 09/29/2018   Walked RA  2 laps @  approx 2549feach @ fast  pace  stopped due to  End of study, sats 89% / min sob on prednisone 10 mg daily    - 10/27/2018   Walked RA  2 laps @  approx 25010fach @ nl  pace  stopped due to  End of study, sats 89%, min sob on pred 10 mg daily  - d/c 02 10/09/2020 and ordered ono RA > done 12/13/20 desats x 10 min @ < 89% so ok to leave off  - 11/27/2021 sats ok walking on 2lpm   Advised:  Make sure you check your oxygen saturation  AT  your highest level of activity (not after you stop)   to be sure it stays over 90% and adjust  02 flow upward to maintain this level if needed but remember to turn it back to previous settings when you stop (to conserve your supply).    Each maintenance medication was reviewed in detail including emphasizing most importantly the difference between maintenance and prns and under what circumstances the prns are to be triggered using an action plan format where appropriate.  Total time for H and P, chart review, counseling,  directly observing portions of ambulatory 02 saturation study/ and generating customized AVS unique to this office visit / same day charting  > 30 min for multiple  refractory respiratory  symptoms of uncertain etiology

## 2021-11-29 ENCOUNTER — Other Ambulatory Visit (HOSPITAL_COMMUNITY): Payer: Self-pay

## 2021-11-30 NOTE — Telephone Encounter (Signed)
Should be take 30 min before meals twice daily to be most effective whether it's prilosec or protonix doesn't matter .

## 2021-11-30 NOTE — Telephone Encounter (Addendum)
Called the pt via [employer] and made her aware of recommendations per Dr Melvyn Novas. She states that she was no longer taking the omeprazole. She takes protonix 20 mg in the am before breakfast.

## 2021-12-05 ENCOUNTER — Other Ambulatory Visit (HOSPITAL_COMMUNITY): Payer: Self-pay

## 2021-12-27 ENCOUNTER — Ambulatory Visit (INDEPENDENT_AMBULATORY_CARE_PROVIDER_SITE_OTHER): Payer: Self-pay | Admitting: Pulmonary Disease

## 2021-12-27 ENCOUNTER — Encounter: Payer: Self-pay | Admitting: Pulmonary Disease

## 2021-12-27 VITALS — BP 132/68 | HR 101 | Temp 98.3°F | Ht <= 58 in | Wt 165.2 lb

## 2021-12-27 DIAGNOSIS — J849 Interstitial pulmonary disease, unspecified: Secondary | ICD-10-CM

## 2021-12-27 LAB — CK: Total CK: 60 U/L (ref 7–177)

## 2021-12-27 NOTE — Patient Instructions (Signed)
We will get additional labs today for work-up of interstitial lung disease Schedule high-res CT and PFTs Return to clinic after PFTs for review and plan for next steps in treatment.

## 2021-12-27 NOTE — Progress Notes (Signed)
Olivia [name]    [id number]    27-May-1965  Primary Care Physician:Gawaluck, Rachell Cipro, MD  Referring Physician: Delene Ruffini, MD [address] [place],  [place] [address]  Chief complaint: Consult for interstitial lung disease  HPI: 56 y.o. who  has a past medical history of Acute respiratory distress (03/27/2020), Elevated blood pressure reading (12/21/2018), GERD (gastroesophageal reflux disease), Healthcare maintenance (06/28/2019), History of COVID-19 (09/02/2019), Preventative health care (01/15/2013), Pulmonary fibrosis ([place]), and Screening breast examination (11/26/2018).   Cough started in 2019 after she had a fall.  Was seen in 2020 with a CT scan showing interstitial lung disease and was referred to pulmonary clinic.  She has been placed on prednisone since then with minimal improvement in symptoms.  Recent chest x-ray shows worsening ILD and has been referred to ILD clinic for further evaluation  Complains of chronic cough, chest congestion and dyspnea for the past 3 years.  Denies any joint pain or rash.  She has occasional difficulty swallowing.  Has dry mouth, dry eyes.  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  No feather pillows or comforters ILD questionnaire 12/27/2021-negative Smoking history: Never smoker Travel history: Immigrated from [place] in 2000.  No significant recent travel Relevant family history: Sister has asthma.  Outpatient Encounter Medications as of 12/27/2021  Medication Sig   albuterol (VENTOLIN HFA) 108 (90 Base) MCG/ACT inhaler Inhale 2 puffs into the lungs every 6 (six) hours as needed for wheezing or shortness of breath.   gabapentin (NEURONTIN) 300 MG capsule Take 1 capsule (300 mg total) by mouth 2 (two) times daily.   pantoprazole (PROTONIX) 20 MG tablet Take 1 tablet (20 mg total) by mouth daily.    predniSONE (DELTASONE) 10 MG tablet Take 2 tablets daily with breakfast until better then one daily   guaiFENesin-codeine 100-10 MG/5ML syrup Take 5 mLs by mouth every 6 (six) hours. (Patient not taking: Reported on 12/27/2021)   senna-docusate (SENOKOT-S) 8.6-50 MG tablet Take 1 tablet by mouth daily. (Patient not taking: Reported on 11/27/2021)   [DISCONTINUED] Baclofen 5 MG TABS Take 1 tablet by mouth 3 (three) times daily as needed for muscle spasms.   No facility-administered encounter medications on file as of 12/27/2021.    Allergies as of 12/27/2021 - Review Complete 12/27/2021  Allergen Reaction Noted   Miralax [polyethylene glycol] Rash 07/09/2021    Past Medical History:  Diagnosis Date   Acute respiratory distress 03/27/2020   Elevated blood pressure reading 12/21/2018   GERD (gastroesophageal reflux disease)    Healthcare maintenance 06/28/2019   Per patient's recent discharge summary, FOBT positive in ED. Given her overall prognosis and health condition, will hold off discussing colonoscopy.   History of COVID-19 09/02/2019   05/03/19 - [place] - positive  Did not get mab  Did not get hosp   Preventative health care 01/15/2013   Pulmonary fibrosis ([place])    Screening breast examination 11/26/2018    Past Surgical History:  Procedure Laterality Date   CHOLECYSTECTOMY  05/2009    Family History  Problem Relation Age of Onset   Diabetes Father    Hypertension Father    Asthma Sister    Diabetes Paternal Grandfather    Breast cancer Neg Hx     Social History   Socioeconomic History   Marital status: Married  Spouse name: Not on file   Number of children: 3   Years of education: Not on file   Highest education level: 9th grade  Occupational History   Not on file  Tobacco Use   Smoking status: Never    Passive exposure: Never   Smokeless tobacco: Never  Vaping Use   Vaping Use: Never used  Substance and Sexual Activity   Alcohol use: Not Currently   Drug  use: No   Sexual activity: Not Currently    Birth control/protection: Post-menopausal  Other Topics Concern   Not on file  Social History Narrative   Native of [place], [place]   Husband, [place], alcoholic   3 children, Oldest son in [place]   Grandchildren, 3         Social Determinants of Health   Financial Resource Strain: High Risk (07/08/2021)   Overall Financial Resource Strain (CARDIA)    Difficulty of Paying Living Expenses: Very hard  Food Insecurity: No Food Insecurity (08/23/2021)   Hunger Vital Sign    Worried About Running Out of Food in the Last Year: Never true    [place] in the Last Year: Never true  Transportation Needs: No Transportation Needs (08/23/2021)   PRAPARE - Hydrologist (Medical): No    Lack of Transportation (Non-Medical): No  Physical Activity: Not on file  Stress: Not on file  Social Connections: Not on file  Intimate Partner Violence: Not on file    Review of systems: Review of Systems  Constitutional: Negative for fever and chills.  HENT: Negative.   Eyes: Negative for blurred vision.  Respiratory: as per HPI  Cardiovascular: Negative for chest pain and palpitations.  Gastrointestinal: Negative for vomiting, diarrhea, blood per rectum. Genitourinary: Negative for dysuria, urgency, frequency and hematuria.  Musculoskeletal: Negative for myalgias, back pain and joint pain.  Skin: Negative for itching and rash.  Neurological: Negative for dizziness, tremors, focal weakness, seizures and loss of consciousness.  Endo/Heme/Allergies: Negative for environmental allergies.  Psychiatric/Behavioral: Negative for depression, suicidal ideas and hallucinations.  All other systems reviewed and are negative.  Physical Exam: Blood pressure 132/68, pulse (!) 101, temperature 98.3 F (36.8 C), temperature source Oral, height '4\' 9"'$  (1.448 m), weight 165 lb 3.2 oz (74.9 kg), SpO2 93 %. Gen:      No acute distress HEENT:   EOMI, sclera anicteric Neck:     No masses; no thyromegaly Lungs:    Clear to auscultation bilaterally; normal respiratory effort CV:         Regular rate and rhythm; no murmurs Abd:      + bowel sounds; soft, non-tender; no palpable masses, no distension Ext:    No edema; adequate peripheral perfusion Skin:      Warm and dry; no rash Neuro: alert and oriented x 3 Psych: normal mood and affect  Data Reviewed: Imaging: High-resolution CT 06/25/2018-widespread groundglass attenuation in the mid to lower lungs with nodular appearance.  Indeterminate for UIP. High-resolution CT 07/09/2019-similar findings of interstitial lung disease as before with slight improvement CTA 03/27/2020-groundglass opacitie obscured by motion artifact CTA 07/07/2021-interstitial and groundglass opacities with significant progression. Chest x-ray 11/12/2021-increased interstitial opacities I have reviewed the images personally.  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  We will plan on getting high-resolution CT, PFTs and serologies for connective tissue disease. Return to clinic in 2 to 3 months for review and plan for next steps  Plan/Recommendations: High-res CT, PFTs, CTD serologies  Marshell Garfinkel MD [facility] 12/27/2021, 2:15 PM  CC: Delene Ruffini, MD

## 2021-12-29 LAB — SJOGREN'S SYNDROME ANTIBODS(SSA + SSB)
SSA (Ro) (ENA) Antibody, IgG: <1 AI
SSB (La) (ENA) Antibody, IgG: <1 AI

## 2021-12-29 LAB — ANTI-SCLERODERMA ANTIBODY: Scleroderma (Scl-70) (ENA) Antibody, IgG: <1 AI

## 2021-12-29 LAB — ANTI-DNA ANTIBODY, DOUBLE-STRANDED: ds DNA Ab: <1 IU/mL

## 2021-12-29 LAB — RHEUMATOID FACTOR: Rheumatoid fact SerPl-aCnc: <14 IU/mL (ref <14)

## 2021-12-29 LAB — ANA: Anti Nuclear Antibody (ANA): NEGATIVE

## 2021-12-29 LAB — CYCLIC CITRUL PEPTIDE ANTIBODY, IGG: Cyclic Citrullin Peptide Ab: <16 UNITS

## 2021-12-31 ENCOUNTER — Other Ambulatory Visit: Payer: Self-pay | Admitting: Gastroenterology

## 2021-12-31 ENCOUNTER — Other Ambulatory Visit (HOSPITAL_COMMUNITY): Payer: Self-pay

## 2021-12-31 LAB — ALDOLASE: Aldolase: 4.8 U/L (ref <=8.1)

## 2021-12-31 MED ORDER — NA SULFATE-K SULFATE-MG SULF 17.5-3.13-1.6 GM/177ML PO SOLN
ORAL | 0 refills | Status: DC
  Filled 2021-12-31: qty 354, 1d supply, fill #0

## 2022-01-03 ENCOUNTER — Encounter (HOSPITAL_COMMUNITY): Payer: Self-pay | Admitting: Gastroenterology

## 2022-01-03 ENCOUNTER — Telehealth: Payer: Self-pay | Admitting: Pulmonary Disease

## 2022-01-03 NOTE — Progress Notes (Signed)
**  Using interpretive services-Attempted to obtain medical history via telephone, unable to reach at this time. HIPAA compliant voicemail message left requesting return call to pre surgical testing department. 

## 2022-01-04 LAB — HYPERSENSITIVITY PNEUMONITIS
A. Pullulans Abs: NEGATIVE
A.Fumigatus #1 Abs: NEGATIVE
Micropolyspora faeni, IgG: NEGATIVE
Pigeon Serum Abs: NEGATIVE
Thermoact. Saccharii: NEGATIVE
Thermoactinomyces vulgaris, IgG: NEGATIVE

## 2022-01-04 LAB — RNP ANTIBODIES: ENA RNP Ab: 0.2 AI (ref 0.0–0.9)

## 2022-01-04 NOTE — Telephone Encounter (Signed)
Called [facility]  Pt needing pulm clearance for colonoscopy  It's scheduled for Tues 01/08/22   Dr Vaughan Browner, pt seen last by you on 12/27/21- are you able to add risk assessment to that note?  Please advise, thanks!

## 2022-01-08 ENCOUNTER — Encounter (HOSPITAL_COMMUNITY): Payer: Self-pay | Admitting: Gastroenterology

## 2022-01-08 ENCOUNTER — Ambulatory Visit (HOSPITAL_COMMUNITY)
Admission: RE | Admit: 2022-01-08 | Discharge: 2022-01-08 | Disposition: A | Discharge status: Home / Self Care | Payer: No Typology Code available for payment source | Attending: Gastroenterology | Admitting: Gastroenterology

## 2022-01-08 ENCOUNTER — Other Ambulatory Visit: Payer: Self-pay

## 2022-01-08 ENCOUNTER — Encounter: Payer: Self-pay | Admitting: Pulmonary Disease

## 2022-01-08 ENCOUNTER — Encounter (HOSPITAL_COMMUNITY)
Admission: RE | Disposition: A | Discharge status: Home / Self Care | Payer: Self-pay | Source: Home / Self Care | Attending: Gastroenterology

## 2022-01-08 ENCOUNTER — Ambulatory Visit (HOSPITAL_COMMUNITY): Payer: No Typology Code available for payment source | Admitting: Certified Registered"

## 2022-01-08 ENCOUNTER — Ambulatory Visit (HOSPITAL_BASED_OUTPATIENT_CLINIC_OR_DEPARTMENT_OTHER): Payer: No Typology Code available for payment source | Admitting: Certified Registered"

## 2022-01-08 DIAGNOSIS — K449 Diaphragmatic hernia without obstruction or gangrene: Secondary | ICD-10-CM | POA: Insufficient documentation

## 2022-01-08 DIAGNOSIS — K5730 Diverticulosis of large intestine without perforation or abscess without bleeding: Secondary | ICD-10-CM

## 2022-01-08 DIAGNOSIS — K644 Residual hemorrhoidal skin tags: Secondary | ICD-10-CM

## 2022-01-08 DIAGNOSIS — D125 Benign neoplasm of sigmoid colon: Secondary | ICD-10-CM | POA: Insufficient documentation

## 2022-01-08 DIAGNOSIS — Z1211 Encounter for screening for malignant neoplasm of colon: Secondary | ICD-10-CM | POA: Insufficient documentation

## 2022-01-08 DIAGNOSIS — R1013 Epigastric pain: Secondary | ICD-10-CM | POA: Insufficient documentation

## 2022-01-08 DIAGNOSIS — K635 Polyp of colon: Secondary | ICD-10-CM

## 2022-01-08 DIAGNOSIS — K648 Other hemorrhoids: Secondary | ICD-10-CM

## 2022-01-08 DIAGNOSIS — K219 Gastro-esophageal reflux disease without esophagitis: Secondary | ICD-10-CM | POA: Insufficient documentation

## 2022-01-08 HISTORY — PX: ESOPHAGOGASTRODUODENOSCOPY (EGD) WITH PROPOFOL: SHX5813

## 2022-01-08 HISTORY — PX: POLYPECTOMY: SHX5525

## 2022-01-08 HISTORY — PX: COLONOSCOPY WITH PROPOFOL: SHX5780

## 2022-01-08 SURGERY — COLONOSCOPY WITH PROPOFOL
Anesthesia: Monitor Anesthesia Care

## 2022-01-08 MED ORDER — ALBUTEROL SULFATE HFA 108 (90 BASE) MCG/ACT IN AERS
INHALATION_SPRAY | RESPIRATORY_TRACT | Status: DC | PRN
  Administered 2022-01-08 (×4): 2 via RESPIRATORY_TRACT

## 2022-01-08 MED ORDER — LACTATED RINGERS IV SOLN: INTRAVENOUS | Status: DC

## 2022-01-08 MED ORDER — PROPOFOL 10 MG/ML IV BOLUS
INTRAVENOUS | Status: DC | PRN
  Administered 2022-01-08: 40 mg via INTRAVENOUS

## 2022-01-08 MED ORDER — PROPOFOL 500 MG/50ML IV EMUL
INTRAVENOUS | Status: DC | PRN
  Administered 2022-01-08: 100 ug/kg/min via INTRAVENOUS

## 2022-01-08 MED ORDER — LIDOCAINE 2% (20 MG/ML) 5 ML SYRINGE
INTRAMUSCULAR | Status: DC | PRN
  Administered 2022-01-08 (×2): 40 mg via INTRAVENOUS

## 2022-01-08 SURGICAL SUPPLY — 25 items

## 2022-01-08 NOTE — Transfer of Care (Signed)
Immediate Anesthesia Transfer of Care Note  Patient: Olivia [name]  Procedure(s) Performed: COLONOSCOPY WITH PROPOFOL ESOPHAGOGASTRODUODENOSCOPY (EGD) WITH PROPOFOL POLYPECTOMY  Patient Location: PACU and Endoscopy Unit  Anesthesia Type:MAC  Level of Consciousness: awake  Airway & Oxygen Therapy: Patient Spontanous Breathing and Patient connected to face mask oxygen  Post-op Assessment: Report given to RN and Post -op Vital signs reviewed and stable  Post vital signs: Reviewed and stable  Last Vitals:  Vitals Value Taken Time  BP    Temp    Pulse 84 01/08/22 1258  Resp 30 01/08/22 1258  SpO2 96 % 01/08/22 1258  Vitals shown include unvalidated device data.  Last Pain:  Vitals:   01/08/22 1112  TempSrc: Temporal  PainSc: 0-No pain         Complications: No notable events documented.

## 2022-01-08 NOTE — Telephone Encounter (Signed)
Spoke with GI- Aaron Edelman aware of documentation for clearance being in Epic  Nothing further needed

## 2022-01-08 NOTE — Discharge Instructions (Addendum)
Soft solid first meal today and slowly advance diet and call if question or problem from a GI standpoint otherwise call for biopsies in 1 week and based on that we will let you know when to repeat colonoscopy  YOU HAD AN ENDOSCOPIC PROCEDURE TODAY: Refer to the procedure report and other information in the discharge instructions given to you for any specific questions about what was found during the examination. If this information does not answer your questions, please call [facility] office at [phone] to clarify.   YOU SHOULD EXPECT: Some feelings of bloating in the abdomen. Passage of more gas than usual. Walking can help get rid of the air that was put into your GI tract during the procedure and reduce the bloating. If you had a lower endoscopy (such as a colonoscopy or flexible sigmoidoscopy) you may notice spotting of blood in your stool or on the toilet paper. Some abdominal soreness may be present for a day or two, also.  DIET: Your first meal following the procedure should be a light meal and then it is ok to progress to your normal diet. A half-sandwich or bowl of soup is an example of a good first meal. Heavy or fried foods are harder to digest and may make you feel nauseous or bloated. Drink plenty of fluids but you should avoid alcoholic beverages for 24 hours. If you had a esophageal dilation, please see attached instructions for diet.    ACTIVITY: Your care partner should take you home directly after the procedure. You should plan to take it easy, moving slowly for the rest of the day. You can resume normal activity the day after the procedure however YOU SHOULD NOT DRIVE, use power tools, machinery or perform tasks that involve climbing or major physical exertion for 24 hours (because of the sedation medicines used during the test).   SYMPTOMS TO REPORT IMMEDIATELY: A gastroenterologist can be reached at any hour. Please call [phone]  for any of the following symptoms:  Following  lower endoscopy (colonoscopy, flexible sigmoidoscopy) Excessive amounts of blood in the stool  Significant tenderness, worsening of abdominal pains  Swelling of the abdomen that is new, acute  Fever of 100 or higher  Following upper endoscopy (EGD, EUS, ERCP, esophageal dilation) Vomiting of blood or coffee ground material  New, significant abdominal pain  New, significant chest pain or pain under the shoulder blades  Painful or persistently difficult swallowing  New shortness of breath  Black, tarry-looking or red, bloody stools  FOLLOW UP:  If any biopsies were taken you will be contacted by phone or by letter within the next 1-3 weeks. Call [phone]  if you have not heard about the biopsies in 3 weeks.  Please also call with any specific questions about appointments or follow up tests. YOU HAD AN ENDOSCOPIC PROCEDURE TODAY: Refer to the procedure report and other information in the discharge instructions given to you for any specific questions about what was found during the examination. If this information does not answer your questions, please call [facility] office at [phone] to clarify.

## 2022-01-08 NOTE — Progress Notes (Signed)
Olivia [name] 12:06 PM  Subjective:  Patient without any GI symptoms in her hospital computer chart and our office computer chart reviewed and case discussed with her son as well  Objective: Vital signs stable afebrile no acute distress exam please see preassessment evaluation  Assessment: Colon screening and abdominal pain  Plan: Okay to proceed with endoscopy and colonoscopy with anesthesia assistance  [facility]  office [phone] After 5PM or if no answer call [phone]

## 2022-01-08 NOTE — Op Note (Signed)
[facility] Patient Name: Jennifermarie Franzen Procedure Date: 01/08/2022 MRN: [id number] Attending MD: Clarene Essex , MD Date of Birth: Feb 07, 1966 CSN: [id number] Age: 56 Admit Type: Outpatient Procedure:                Upper GI endoscopy Indications:              Epigastric abdominal pain Providers:                Clarene Essex, MD, Ladoris Gene, RN, Theodora Blow,                            Technician Referring MD:              Medicines:                Monitored Anesthesia Care Complications:            No immediate complications. Estimated Blood Loss:     Estimated blood loss: none. Procedure:                Pre-Anesthesia Assessment:                           - Prior to the procedure, a History and Physical                            was performed, and patient medications and                            allergies were reviewed. The patient's tolerance of                            previous anesthesia was also reviewed. The risks                            and benefits of the procedure and the sedation                            options and risks were discussed with the patient.                            All questions were answered, and informed consent                            was obtained. Prior Anticoagulants: The patient has                            taken no previous anticoagulant or antiplatelet                            agents. ASA Grade Assessment: III - A patient with                            severe systemic disease. After reviewing the risks  and benefits, the patient was deemed in                            satisfactory condition to undergo the procedure.                           After obtaining informed consent, the endoscope was                            passed under direct vision. Throughout the                            procedure, the patient's blood pressure, pulse, and                            oxygen saturations  were monitored continuously. The                            [id number]-[id number] ([id number]) Olympus endoscope was introduced                            through the mouth, and advanced to the third part                            of duodenum. The upper GI endoscopy was                            accomplished without difficulty. The patient                            tolerated the procedure fairly well. Scope In: Scope Out: Findings:      A medium-sized hiatal hernia was present.      The entire examined stomach was normal.      The duodenal bulb, first portion of the duodenum, second portion of the       duodenum and third portion of the duodenum were normal.      The exam was otherwise without abnormality. Impression:               - Medium-sized hiatal hernia.                           - Normal stomach.                           - Normal duodenal bulb, first portion of the                            duodenum, second portion of the duodenum and third                            portion of the duodenum.                           - The examination was otherwise normal.                           -  No specimens collected. Moderate Sedation:      Not Applicable - Patient had care per Anesthesia. Recommendation:           - Patient has a contact number available for                            emergencies. The signs and symptoms of potential                            delayed complications were discussed with the                            patient. Return to normal activities tomorrow.                            Written discharge instructions were provided to the                            patient.                           - Soft diet today.                           - Continue present medications.                           - Await pathology results.                           - Return to GI clinic PRN.                           - Telephone GI clinic for pathology results in 1                             week.                           - Telephone GI clinic if symptomatic PRN.                           - Perform a colonoscopy today. Procedure Code(s):        --- Professional ---                           [phone], Esophagogastroduodenoscopy, flexible,                            transoral; diagnostic, including collection of                            specimen(s) by brushing or washing, when performed                            (separate procedure) Diagnosis Code(s):        --- Professional ---  K44.9, Diaphragmatic hernia without obstruction or                            gangrene                           R10.13, Epigastric pain CPT copyright 2019 [employer]. All rights reserved. The codes documented in this report are preliminary and upon coder review may  be revised to meet current compliance requirements. Clarene Essex, MD 01/08/2022 1:00:45 PM This report has been signed electronically. Number of Addenda: 0

## 2022-01-08 NOTE — Op Note (Signed)
[facility] Patient Name: Olivia [name] Procedure Date: 01/08/2022 MRN: [id number] Attending MD: Clarene Essex , MD Date of Birth: Jan 07, 1966 CSN: [id number] Age: 56 Admit Type: Outpatient Procedure:                Colonoscopy Indications:              Screening for colorectal malignant neoplasm, This                            is the patient's first colonoscopy Providers:                Clarene Essex, MD, Ladoris Gene, RN, Theodora Blow,                            Technician Referring MD:              Medicines:                Monitored Anesthesia Care Complications:            No immediate complications. Estimated Blood Loss:     Estimated blood loss: none. Procedure:                Pre-Anesthesia Assessment:                           - Prior to the procedure, a History and Physical                            was performed, and patient medications and                            allergies were reviewed. The patient's tolerance of                            previous anesthesia was also reviewed. The risks                            and benefits of the procedure and the sedation                            options and risks were discussed with the patient.                            All questions were answered, and informed consent                            was obtained. Prior Anticoagulants: The patient has                            taken no previous anticoagulant or antiplatelet                            agents. ASA Grade Assessment: III - A patient with                            severe  systemic disease. After reviewing the risks                            and benefits, the patient was deemed in                            satisfactory condition to undergo the procedure.                           - Prior to the procedure, a History and Physical                            was performed, and patient medications and                            allergies were reviewed.  The patient's tolerance of                            previous anesthesia was also reviewed. The risks                            and benefits of the procedure and the sedation                            options and risks were discussed with the patient.                            All questions were answered, and informed consent                            was obtained. Prior Anticoagulants: The patient has                            taken no previous anticoagulant or antiplatelet                            agents. ASA Grade Assessment: III - A patient with                            severe systemic disease. After reviewing the risks                            and benefits, the patient was deemed in                            satisfactory condition to undergo the procedure.                           After obtaining informed consent, the colonoscope                            was passed under direct vision. Throughout the  procedure, the patient's blood pressure, pulse, and                            oxygen saturations were monitored continuously. The                            PCF-HQ190L ([id number]) Olympus colonoscope was                            introduced through the anus and advanced to the the                            terminal ileum. The terminal ileum, ileocecal                            valve, appendiceal orifice, and rectum were                            photographed. The colonoscopy was performed without                            difficulty. The patient tolerated the procedure                            well. The quality of the bowel preparation was                            adequate. Scope In: 12:30:24 PM Scope Out: 12:49:34 PM Scope Withdrawal Time: 0 hours 13 minutes 59 seconds  Total Procedure Duration: 0 hours 19 minutes 10 seconds  Findings:      External and internal hemorrhoids were found during retroflexion, during       perianal exam and  during digital exam. The hemorrhoids were small.      Scattered small and large-mouthed diverticula were found in the sigmoid       colon and descending colon.      A medium polyp was found in the distal sigmoid colon. The polyp was       pedunculated. The polyp was removed with a hot snare. Resection and       retrieval were complete.      The terminal ileum appeared normal.      The exam was otherwise without abnormality. Impression:               - External and internal hemorrhoids.                           - Diverticulosis in the sigmoid colon and in the                            descending colon.                           - One medium polyp in the distal sigmoid colon,                            removed  with a hot snare. Resected and retrieved.                           - The examined portion of the ileum was normal.                           - The examination was otherwise normal. Moderate Sedation:      Not Applicable - Patient had care per Anesthesia. Recommendation:           - Patient has a contact number available for                            emergencies. The signs and symptoms of potential                            delayed complications were discussed with the                            patient. Return to normal activities tomorrow.                            Written discharge instructions were provided to the                            patient.                           - Soft diet today.                           - Continue present medications.                           - Await pathology results.                           - Repeat colonoscopy date to be determined after                            pending pathology results are reviewed for                            surveillance based on pathology results.                           - Return to GI office PRN.                           - Telephone GI clinic for pathology results in 1                            week.                            - Telephone GI clinic if symptomatic PRN. Procedure Code(s):        --- Professional ---  45385, Colonoscopy, flexible; with removal of                            tumor(s), polyp(s), or other lesion(s) by snare                            technique Diagnosis Code(s):        --- Professional ---                           Z12.11, Encounter for screening for malignant                            neoplasm of colon                           K63.5, Polyp of colon                           K57.30, Diverticulosis of large intestine without                            perforation or abscess without bleeding CPT copyright 2019 American Medical Association. All rights reserved. The codes documented in this report are preliminary and upon coder review may  be revised to meet current compliance requirements. Clarene Essex, MD 01/08/2022 1:08:24 PM This report has been signed electronically. Number of Addenda: 0

## 2022-01-08 NOTE — Telephone Encounter (Signed)
See. Separate note. Thanks

## 2022-01-08 NOTE — Progress Notes (Signed)
Pulmonary note  Asked to provide preprocedure pulmonary evaluation for [name] who is scheduled to undergo colonoscopy under propofol sedation.  Patient was seen in clinic on 12/27/2021.  See office visit note for more details She is being evaluated for interstitial lung disease. Overall at increased risk for the procedure pulmonary issues but no obvious contraindication. Per Olivia [name] post operative respiratory failure calculator her risk of peri operative complication is 3%  Peri-operative Assessment of Pulmonary Risk for Non-Thoracic Surgery:  For Olivia [name], risk of perioperative pulmonary complications is increased by: Interstitial lung disease   Respiratory complications generally occur in 1% of ASA Class I patients, 5% of ASA Class II and 10% of ASA Class III-IV patients These complications rarely result in mortality and iclude postoperative pneumonia, atelectasis, pulmonary embolism, ARDS and increased time requiring postoperative mechanical ventilation.  Overall, I recommend proceeding with the surgery if the risk for respiratory complications are outweighed by the potential benefits. This will need to be discussed between the patient and surgeon.  To reduce risks of respiratory complications, I recommend: -- Pre- and post-operative incentive spirometry performed frequently while awake -- Early mobilization  Olivia Garfinkel MD [facility] 01/08/2022, 10:30 AM

## 2022-01-08 NOTE — Anesthesia Procedure Notes (Signed)
Procedure Name: MAC Date/Time: 01/08/2022 12:13 PM  Performed by: Cynda Familia, CRNAOxygen Delivery Method: Simple face mask Placement Confirmation: positive ETCO2 and breath sounds checked- equal and bilateral Dental Injury: Teeth and Oropharynx as per pre-operative assessment  Comments: Bite block by tech

## 2022-01-08 NOTE — Anesthesia Postprocedure Evaluation (Signed)
Anesthesia Post Note  Patient: Olivia [name]  Procedure(s) Performed: COLONOSCOPY WITH PROPOFOL ESOPHAGOGASTRODUODENOSCOPY (EGD) WITH PROPOFOL POLYPECTOMY     Patient location during evaluation: PACU Anesthesia Type: MAC Level of consciousness: awake and alert Pain management: pain level controlled Vital Signs Assessment: post-procedure vital signs reviewed and stable Respiratory status: spontaneous breathing, nonlabored ventilation, respiratory function stable and patient connected to nasal cannula oxygen Cardiovascular status: stable and blood pressure returned to baseline Postop Assessment: no apparent nausea or vomiting Anesthetic complications: no   No notable events documented.  Last Vitals:  Vitals:   01/08/22 1310 01/08/22 1320  BP: 101/78 113/85  Pulse: 90 89  Resp: 20 (!) 21  Temp:    SpO2: 100% 99%    Last Pain:  Vitals:   01/08/22 1320  TempSrc:   PainSc: 0-No pain                 Claudene Gatliff S

## 2022-01-08 NOTE — Anesthesia Preprocedure Evaluation (Signed)
Anesthesia Evaluation  Patient identified by MRN, date of birth, ID band Patient awake    Reviewed: Allergy & Precautions, NPO status , Patient's Chart, lab work & pertinent test results  Airway Mallampati: II  TM Distance: >3 FB Neck ROM: Full    Dental no notable dental hx.    Pulmonary neg pulmonary ROS,    Pulmonary exam normal breath sounds clear to auscultation       Cardiovascular negative cardio ROS Normal cardiovascular exam Rhythm:Regular Rate:Normal     Neuro/Psych negative neurological ROS  negative psych ROS   GI/Hepatic Neg liver ROS, GERD  Medicated,  Endo/Other  negative endocrine ROS  Renal/GU negative Renal ROS  negative genitourinary   Musculoskeletal negative musculoskeletal ROS (+)   Abdominal   Peds negative pediatric ROS (+)  Hematology negative hematology ROS (+)   Anesthesia Other Findings   Reproductive/Obstetrics negative OB ROS                             Anesthesia Physical Anesthesia Plan  ASA: 2  Anesthesia Plan: MAC   Post-op Pain Management:    Induction: Intravenous  PONV Risk Score and Plan: 2 and Propofol infusion and Treatment may vary due to age or medical condition  Airway Management Planned: Simple Face Mask  Additional Equipment:   Intra-op Plan:   Post-operative Plan:   Informed Consent: I have reviewed the patients History and Physical, chart, labs and discussed the procedure including the risks, benefits and alternatives for the proposed anesthesia with the patient or authorized representative who has indicated his/her understanding and acceptance.     Dental advisory given  Plan Discussed with: CRNA and Surgeon  Anesthesia Plan Comments:         Anesthesia Quick Evaluation

## 2022-01-10 ENCOUNTER — Encounter (HOSPITAL_COMMUNITY): Payer: Self-pay | Admitting: Gastroenterology

## 2022-01-10 LAB — SURGICAL PATHOLOGY

## 2022-01-17 LAB — MYOMARKER 3 PLUS PROFILE (RDL)

## 2022-01-21 ENCOUNTER — Other Ambulatory Visit (HOSPITAL_COMMUNITY): Payer: Self-pay

## 2022-01-22 ENCOUNTER — Encounter: Payer: Self-pay | Admitting: *Deleted

## 2022-02-22 ENCOUNTER — Ambulatory Visit (HOSPITAL_COMMUNITY)
Admission: RE | Admit: 2022-02-22 | Discharge: 2022-02-22 | Disposition: A | Discharge status: Home / Self Care | Payer: No Typology Code available for payment source | Source: Ambulatory Visit | Attending: Pulmonary Disease | Admitting: Pulmonary Disease

## 2022-02-22 ENCOUNTER — Ambulatory Visit: Payer: No Typology Code available for payment source

## 2022-02-22 DIAGNOSIS — J849 Interstitial pulmonary disease, unspecified: Secondary | ICD-10-CM | POA: Insufficient documentation

## 2022-02-25 ENCOUNTER — Encounter: Payer: Self-pay | Admitting: Pulmonary Disease

## 2022-02-25 ENCOUNTER — Ambulatory Visit (INDEPENDENT_AMBULATORY_CARE_PROVIDER_SITE_OTHER): Payer: No Typology Code available for payment source | Admitting: Pulmonary Disease

## 2022-02-25 VITALS — BP 130/72 | HR 91 | Temp 98.3°F | Ht <= 58 in | Wt 161.2 lb

## 2022-02-25 DIAGNOSIS — J849 Interstitial pulmonary disease, unspecified: Secondary | ICD-10-CM

## 2022-02-25 DIAGNOSIS — R0602 Shortness of breath: Secondary | ICD-10-CM

## 2022-02-25 LAB — COMPREHENSIVE METABOLIC PANEL
ALT: 22 U/L (ref 0–35)
AST: 23 U/L (ref 0–37)
Albumin: 4 g/dL (ref 3.5–5.2)
Alkaline Phosphatase: 66 U/L (ref 39–117)
BUN: 13 mg/dL (ref 6–23)
CO2: 27 mEq/L (ref 19–32)
Calcium: 9.4 mg/dL (ref 8.4–10.5)
Chloride: 105 mEq/L (ref 96–112)
Creatinine, Ser: 0.52 mg/dL (ref 0.40–1.20)
GFR: 103.74 mL/min (ref >60.00)
Glucose, Bld: 90 mg/dL (ref 70–99)
Potassium: 3.6 mEq/L (ref 3.5–5.1)
Sodium: 139 mEq/L (ref 135–145)
Total Bilirubin: 0.7 mg/dL (ref 0.2–1.2)
Total Protein: 7.6 g/dL (ref 6.0–8.3)

## 2022-02-25 NOTE — Patient Instructions (Addendum)
I have reviewed your CT scan and labs You may have a condition called pulmonary fibrosis which is scarring process in the lung We will start you on a medication called pirfenidone We will check comprehensive metabolic panel and proBNP for dyspnea today Reduce prednisone to 10 mg in the morning and 5 mg in the evening for 2 to 4 weeks and then reduce further to 10 mg in the morning.  Hold at that dose until he can be seen back in clinic Reschedule pulmonary function test in 3 months Return to clinic in 3 months.   He revisado tu tomografa computarizada y tus anlisis de laboratorio. Es posible que tenga una afeccin llamada fibrosis pulmonar, que es un proceso de cicatrizacin en el pulmn. Le comenzaremos con un medicamento llamado pirfenidona. Hoy revisaremos el panel metablico completo y el proBNP para Hydrographic surveyor disnea. Reduzca la prednisona a 10 mg por la maana y 5 mg por la noche durante 2 a 4 semanas y [employer] reduzca an ms a 10 mg por la [employer] Mantenga esa dosis hasta que pueda volver a verlo en la clnica. Reprogramar prueba de funcin pulmonar en 3 meses Regreso a clnica en 3 meses.

## 2022-02-25 NOTE — Progress Notes (Signed)
Olivia [name]    [id number]    03/27/1966  Primary Care Physician:Gawaluck, Rachell Cipro, MD  Referring Physician: Delene Ruffini, MD [address] [place],  [place] [address]  Chief complaint: Follow-up for interstitial lung disease  HPI: 56 y.o. who  has a past medical history of Acute respiratory distress (03/27/2020), Elevated blood pressure reading (12/21/2018), GERD (gastroesophageal reflux disease), Healthcare maintenance (06/28/2019), History of COVID-19 (09/02/2019), Preventative health care (01/15/2013), Pulmonary fibrosis ([place]), and Screening breast examination (11/26/2018).   Cough started in 2019 after she had a fall.  Was seen in 2020 with a CT scan showing interstitial lung disease and was referred to pulmonary clinic.  She has been placed on prednisone since then with minimal improvement in symptoms.  Recent chest x-ray shows worsening ILD and has been referred to ILD clinic for further evaluation  Complains of chronic cough, chest congestion and dyspnea for the past 3 years.  Denies any joint pain or rash.  She has occasional difficulty swallowing.  Has dry mouth, dry eyes.  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  No feather pillows or comforters ILD questionnaire 12/27/2021-negative Smoking history: Never smoker Travel history: Immigrated from [place] in 2000.  No significant recent travel Relevant family history: Sister has asthma.  Interim history: She is here for review of CT.  Continues to have chronic cough, dyspnea on exertion with no change to  Outpatient Encounter Medications as of 02/25/2022  Medication Sig   albuterol (VENTOLIN HFA) 108 (90 Base) MCG/ACT inhaler Inhale 2 puffs into the lungs every 6 (six) hours as needed for wheezing or shortness of breath.   gabapentin (NEURONTIN) 300 MG capsule Take 1 capsule  (300 mg total) by mouth 2 (two) times daily.   guaiFENesin-codeine 100-10 MG/5ML syrup Take 5 mLs by mouth every 6 (six) hours.   pantoprazole (PROTONIX) 20 MG tablet Take 1 tablet (20 mg total) by mouth daily.   predniSONE (DELTASONE) 10 MG tablet Take 2 tablets daily with breakfast until better then one daily   Multiple Vitamins-Minerals (MULTIVITAMIN WITH MINERALS) tablet Take 1 tablet by mouth daily. (Patient not taking: Reported on 02/25/2022)   Na Sulfate-K Sulfate-Mg Sulf 17.5-3.13-1.6 GM/177ML SOLN Take as directed. (Patient not taking: Reported on 02/25/2022)   senna-docusate (SENOKOT-S) 8.6-50 MG tablet Take 1 tablet by mouth daily. (Patient not taking: Reported on 02/25/2022)   No facility-administered encounter medications on file as of 02/25/2022.   Physical Exam: Blood pressure 130/72, pulse 91, temperature 98.3 F (36.8 C), temperature source Oral, height '4\' 9"'$  (1.448 m), weight 161 lb 3.2 oz (73.1 kg), SpO2 93 %. Gen:      No acute distress HEENT:  EOMI, sclera anicteric Neck:     No masses; no thyromegaly Lungs:    Clear to auscultation bilaterally; normal respiratory effort CV:         Regular rate and rhythm; no murmurs Abd:      + bowel sounds; soft, non-tender; no palpable masses, no distension Ext:    No edema; adequate peripheral perfusion Skin:      Warm and dry; no rash Neuro: alert and oriented x 3 Psych: normal mood and affect   Data Reviewed: Imaging: High-resolution CT 06/25/2018-widespread groundglass attenuation in the mid to lower lungs with nodular appearance.  Indeterminate for UIP. High-resolution CT 07/09/2019-similar findings of interstitial  lung disease as before with slight improvement CTA 03/27/2020-groundglass opacitie obscured by motion artifact CTA 07/07/2021-interstitial and groundglass opacities with significant progression. Chest x-ray 11/12/2021-increased interstitial opacities CT high-resolution 02/22/2022-interstitial lung disease with basilar  predominant fibrosis and probable UIP pattern, coronary atherosclerosis. I have reviewed the images personally.  PFTs: 09/02/2019-unable to complete  02/22/2022-unable to complete due to cough  Labs: Hypersensitivity panel 09/01/2018-negative ANA, rheumatoid factor 08/04/2018-negative  Assessment:  Interstitial lung disease Review of CT scan shows ILD dating back to 2020 initially read as indeterminate but now in probable UIP pattern.  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  Agree with detailed discussion about findings and treatment with antifibrotic's.  We have agreed to initiate pirfenidone therapy.  Check comprehensive metabolic panel, proBNP for monitoring.  She is currently on prednisone which she will start tapering as its not beneficial in this setting.  She will reduce dose to 15 mg a day and then to 10 mg a day and hold at that dose until return to clinic.  Plan/Recommendations: Labs for monitoring Start pirfenidone Prednisone taper  Marshell Garfinkel MD [facility] 02/25/2022, 8:55 AM  CC: Delene Ruffini, MD

## 2022-02-26 ENCOUNTER — Telehealth: Payer: Self-pay | Admitting: Pharmacist

## 2022-02-26 ENCOUNTER — Other Ambulatory Visit (HOSPITAL_COMMUNITY): Payer: Self-pay

## 2022-02-26 LAB — PRO B NATRIURETIC PEPTIDE: NT-Pro BNP: 55 pg/mL (ref 0–287)

## 2022-02-26 NOTE — Telephone Encounter (Signed)
Patient is uninsured. Will need to proceed with patient assistance.

## 2022-02-26 NOTE — Telephone Encounter (Signed)
Received new start paperwork for Esbriet.   Please start Esbriet BIV (run for brand name first due to patient assistance requirement)   Dose using '267mg'$  tabs: Take 1 tab three times daily for 7 days, then 2 tabs three times daily for 7 days, then 3 tabs three times daily thereafter.   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

## 2022-02-26 NOTE — Telephone Encounter (Signed)
Submitted Patient Assistance Application to [place] for [facility] along with provider portion, patient portion and med list. Patient uninsured so no PA. Will update patient when we receive a response.  Fax# [phone] Phone# [phone]  Knox Saliva, PharmD, MPH, BCPS, CPP Clinical Pharmacist (Rheumatology and Pulmonology)

## 2022-03-05 ENCOUNTER — Encounter: Payer: Self-pay | Admitting: *Deleted

## 2022-03-05 NOTE — Telephone Encounter (Signed)
Received a fax from  [place] regarding an approval for [place] patient assistance from 03/01/2022 until therapy is discontinued, health insurance or financial status changes, or patient no longer meets program eligibility requirements.   [facility] phone number: [phone] [employer] phone: [phone]  Knox Saliva, PharmD, MPH, BCPS, CPP Clinical Pharmacist (Rheumatology and Pulmonology)

## 2022-03-06 ENCOUNTER — Telehealth: Payer: Self-pay

## 2022-03-06 DIAGNOSIS — Z5181 Encounter for therapeutic drug level monitoring: Secondary | ICD-10-CM

## 2022-03-06 DIAGNOSIS — J849 Interstitial pulmonary disease, unspecified: Secondary | ICD-10-CM

## 2022-03-09 ENCOUNTER — Other Ambulatory Visit (HOSPITAL_COMMUNITY): Payer: Self-pay

## 2022-03-20 NOTE — Telephone Encounter (Signed)
Subjective:  Patient called today by [facility] Pulmonary pharmacy team for Esbriet new start.   Patient was last seen by Dr. Vaughan Browner on 02/25/2022.  Pertinent past medical history includes PVD, depression, HLD, and IPF.Marland Kitchen Prior therapy includes none.   History of elevated LFTs: No History of diarrhea, nausea, vomiting: No  Objective: Allergies  Allergen Reactions   Miralax [Polyethylene Glycol] Rash    Outpatient Encounter Medications as of 03/06/2022  Medication Sig   albuterol (VENTOLIN HFA) 108 (90 Base) MCG/ACT inhaler Inhale 2 puffs into the lungs every 6 (six) hours as needed for wheezing or shortness of breath.   gabapentin (NEURONTIN) 300 MG capsule Take 1 capsule (300 mg total) by mouth 2 (two) times daily.   guaiFENesin-codeine 100-10 MG/5ML syrup Take 5 mLs by mouth every 6 (six) hours.   Multiple Vitamins-Minerals (MULTIVITAMIN WITH MINERALS) tablet Take 1 tablet by mouth daily. (Patient not taking: Reported on 02/25/2022)   Na Sulfate-K Sulfate-Mg Sulf 17.5-3.13-1.6 GM/177ML SOLN Take as directed. (Patient not taking: Reported on 02/25/2022)   pantoprazole (PROTONIX) 20 MG tablet Take 1 tablet (20 mg total) by mouth daily.   predniSONE (DELTASONE) 10 MG tablet Take 2 tablets daily with breakfast until better then one daily   senna-docusate (SENOKOT-S) 8.6-50 MG tablet Take 1 tablet by mouth daily. (Patient not taking: Reported on 02/25/2022)   No facility-administered encounter medications on file as of 03/06/2022.     Immunization History  Administered Date(s) Administered   Influenza,inj,Quad PF,6+ Mos 03/20/2018, 06/28/2019, 02/22/2020, 02/12/2021   PFIZER(Purple Top)SARS-COV-2 Vaccination 09/24/2019, 10/15/2019   Td 05/06/1996   Tdap 01/15/2013      PFT's No results found for: "FEV1", "FVC", "FEV1FVC", "TLC", "DLCO"    CMP     Component Value Date/Time   NA 139 02/25/2022 0931   NA 138 05/30/2021 1640   K 3.6 02/25/2022 0931   CL 105 02/25/2022 0931   CO2  27 02/25/2022 0931   GLUCOSE 90 02/25/2022 0931   BUN 13 02/25/2022 0931   BUN 12 05/30/2021 1640   CREATININE 0.52 02/25/2022 0931   CREATININE 0.52 05/23/2014 0855   CALCIUM 9.4 02/25/2022 0931   PROT 7.6 02/25/2022 0931   ALBUMIN 4.0 02/25/2022 0931   AST 23 02/25/2022 0931   ALT 22 02/25/2022 0931   ALKPHOS 66 02/25/2022 0931   BILITOT 0.7 02/25/2022 0931   GFRNONAA >60 07/08/2021 0131   GFRAA >60 08/22/2017 0546      CBC    Component Value Date/Time   WBC 9.9 07/09/2021 0135   RBC 4.09 07/09/2021 0135   HGB 12.4 07/09/2021 0135   HGB 13.8 12/26/2016 1643   HCT 37.7 07/09/2021 0135   HCT 41.1 12/26/2016 1643   PLT 242 07/09/2021 0135   PLT 268 12/26/2016 1643   MCV 92.2 07/09/2021 0135   MCV 89 12/26/2016 1643   MCH 30.3 07/09/2021 0135   MCHC 32.9 07/09/2021 0135   RDW 11.7 07/09/2021 0135   RDW 13.0 12/26/2016 1643   LYMPHSABS 0.5 (L) 07/08/2021 0131   MONOABS 0.1 07/08/2021 0131   EOSABS 0.0 07/08/2021 0131   BASOSABS 0.0 07/08/2021 0131      LFT's    Latest Ref Rng & Units 02/25/2022    9:31 AM 07/07/2021    9:35 AM 08/22/2017    5:46 AM  Hepatic Function  Total Protein 6.0 - 8.3 g/dL 7.6  7.7  7.8   Albumin 3.5 - 5.2 g/dL 4.0  3.5  3.9   AST  0 - 37 U/L _0 ALT 0 - 35 U/L _1 Alk Phosphatase 39 - 117 U/L 66  75  79   Total Bilirubin 0.2 - 1.2 mg/dL 0.7  0.7  0.8       HRCT (02/22/2022)  Interstitial lung disease with a spectrum of findings considered probable usual interstitial pneumonia (UIP) per current ATS guidelines, with minimal progression compared to prior examinations, as above.  Assessment and Plan  Esbriet Medication Management Thoroughly counseled patient on the efficacy, mechanism of action, dosing, administration, adverse effects, and monitoring parameters of Esbriet.  Patient verbalized understanding.   Goals of Therapy: Will not stop or reverse the progression of ILD. It will slow the progression of ILD.    Dosing: Starting dose will be Esbriet 267 mg 1 tablet three times daily for 7 days, then 2 tablets three times daily for 7 days, then 3 tablets three times daily.  Maintenance dose will be 801 mg 1 tablet three times daily if tolerated.  Stressed the importance of taking with meals and space at least 5-6 hours apart to minimize stomach upset.   Adverse Effects: Nausea, vomiting, diarrhea, weight loss Abdominal pain GERD Sun sensitivity/rash - patient advised to wear sunscreen when exposed to sunlight Dizziness Fatigue  Monitoring: Monitor for diarrhea, nausea and vomiting, GI perforation, hepatotoxicity  Monitor LFTs - baseline, monthly for first 6 months, then every 3 months routinely Pregnancy test - baseline prn CBC w differential at baseline and every 3 months routinely  Access: Approval of Esbriet through: patient assistance Rx sent to: [facility] [facility], dispensing) for Esbriet: [phone]  Medication Reconciliation A drug regimen assessment was performed, including review of allergies, interactions, disease-state management, dosing and immunization history. Medications were reviewed with the patient, including name, instructions, indication, goals of therapy, potential side effects, importance of adherence, and safe use.  Immunizations Patient is indicated for the influenzae, pneumonia, and shingles vaccinations. Patient has received 2 COVID19 vaccines.  Thank you for involving pharmacy to assist in providing this patient's care.   Maryan Puls, PharmD PGY-1 [facility] Pharmacy Resident

## 2022-03-27 NOTE — Telephone Encounter (Signed)
Attempted to call to confirm Esbriet shipment. LVM with an interpreter.

## 2022-04-02 NOTE — Telephone Encounter (Signed)
LVM with patient via interpreter [phone]) letting her know that we are inquiring about if she received her Esbriet yet.   [place] [facility] PharmD Candidate [phone]

## 2022-04-04 NOTE — Telephone Encounter (Signed)
Called patient with interpreter [phone]) to confirm that she received her Esbriet. Patient indicated that she did indeed receive the medication but after speaking with her family about Esbriet, she is very concerned about her liver function if she takes it and she doesn't want to take it and potentially die from liver problems; she also said the medication will not fix her lungs so she doesn't want to take it. Informed patient that hepatotoxicity is something that we monitor for routinely when a patient takes Esbriet, and ILD as a disease is progressive so Esbriet works by slowing down that inevitable progression. Patient asked what the odds are that her lungs get worse and she was informed again that ILD is progressive and it does not fix the already present scarring, it slows down the creation of new scarring.   Patient then indicated that she is still not comfortable starting this medication and was counseled to make an appointment with Dr. Vaughan Browner to voice these concerns and make a shared decision with him to ensure patient buy in and increased adherence with whatever treatment option they choose to move forward with.  [place] [facility] PharmD Candidate [phone]

## 2022-04-08 NOTE — Telephone Encounter (Signed)
Lisa-can you please make a sooner appointment with me to discuss Esbriet therapy.

## 2022-04-08 NOTE — Telephone Encounter (Signed)
Called patient via Spanish interpreter [phone] [facility].  Dr. Matilde Bash recommendations given.  Patient scheduled 0915 04/11/22 with Dr. Vaughan Browner.  Nothing further at this time.

## 2022-04-11 ENCOUNTER — Encounter: Payer: Self-pay | Admitting: Pulmonary Disease

## 2022-04-11 ENCOUNTER — Ambulatory Visit (INDEPENDENT_AMBULATORY_CARE_PROVIDER_SITE_OTHER): Payer: No Typology Code available for payment source | Admitting: Pulmonary Disease

## 2022-04-11 VITALS — BP 110/70 | HR 82 | Temp 98.3°F | Ht <= 58 in | Wt 156.4 lb

## 2022-04-11 DIAGNOSIS — Z5181 Encounter for therapeutic drug level monitoring: Secondary | ICD-10-CM

## 2022-04-11 DIAGNOSIS — J849 Interstitial pulmonary disease, unspecified: Secondary | ICD-10-CM

## 2022-04-11 NOTE — Progress Notes (Signed)
Olivia [name]    [id number]    18-Mar-1966  Primary Care Physician:Gawaluck, Rachell Cipro, MD  Referring Physician: Delene Ruffini, MD [address] [place],  [place] [address]  Chief complaint: Follow-up for interstitial lung disease  HPI: 56 y.o. who  has a past medical history of Acute respiratory distress (03/27/2020), Elevated blood pressure reading (12/21/2018), GERD (gastroesophageal reflux disease), Healthcare maintenance (06/28/2019), History of COVID-19 (09/02/2019), Preventative health care (01/15/2013), Pulmonary fibrosis ([place]), and Screening breast examination (11/26/2018).   Cough started in 2019 after she had a fall.  Was seen in 2020 with a CT scan showing interstitial lung disease and was referred to pulmonary clinic.  She has been placed on prednisone since then with minimal improvement in symptoms.  Recent chest x-ray shows worsening ILD and has been referred to ILD clinic for further evaluation  Complains of chronic cough, chest congestion and dyspnea for the past 3 years.  Denies any joint pain or rash.  She has occasional difficulty swallowing.  Has dry mouth, dry eyes.  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  No feather pillows or comforters ILD questionnaire 12/27/2021-negative Smoking history: Never smoker Travel history: Immigrated from [place] in 2000.  No significant recent travel Relevant family history: Sister has asthma.  Interim history: Pirfenidone prescribed at last visit.  She is reluctant to start the medication because of concern of side effects and is here to discuss further.  Outpatient Encounter Medications as of 04/11/2022  Medication Sig   albuterol (VENTOLIN HFA) 108 (90 Base) MCG/ACT inhaler Inhale 2 puffs into the lungs every 6 (six) hours as needed for wheezing or shortness of breath.    Dextromethorphan-guaiFENesin (MUCINEX DM PO) Take by mouth in the morning and at bedtime.   gabapentin (NEURONTIN) 300 MG capsule Take 1 capsule (300 mg total) by mouth 2 (two) times daily.   Multiple Vitamins-Minerals (MULTIVITAMIN WITH MINERALS) tablet Take 1 tablet by mouth daily.   pantoprazole (PROTONIX) 20 MG tablet Take 1 tablet (20 mg total) by mouth daily.   predniSONE (DELTASONE) 10 MG tablet Take 2 tablets daily with breakfast until better then one daily   [DISCONTINUED] guaiFENesin-codeine 100-10 MG/5ML syrup Take 5 mLs by mouth every 6 (six) hours.   [DISCONTINUED] Na Sulfate-K Sulfate-Mg Sulf 17.5-3.13-1.6 GM/177ML SOLN Take as directed. (Patient not taking: Reported on 02/25/2022)   [DISCONTINUED] senna-docusate (SENOKOT-S) 8.6-50 MG tablet Take 1 tablet by mouth daily. (Patient not taking: Reported on 02/25/2022)   No facility-administered encounter medications on file as of 04/11/2022.   Physical Exam: Blood pressure 110/70, pulse 82, temperature 98.3 F (36.8 C), temperature source Oral, height '4\' 9"'$  (1.448 m), weight 156 lb 6.4 oz (70.9 kg), SpO2 96 %. Gen:      No acute distress HEENT:  EOMI, sclera anicteric Neck:     No masses; no thyromegaly Lungs:    Clear to auscultation bilaterally; normal respiratory effort CV:         Regular rate and rhythm; no murmurs Abd:      + bowel sounds; soft, non-tender; no palpable masses, no distension Ext:    No edema; adequate peripheral perfusion Skin:      Warm and dry; no rash Neuro: alert and oriented x 3 Psych: normal mood and affect   Data Reviewed: Imaging: High-resolution CT 06/25/2018-widespread groundglass attenuation in the  mid to lower lungs with nodular appearance.  Indeterminate for UIP. High-resolution CT 07/09/2019-similar findings of interstitial lung disease as before with slight improvement CTA 03/27/2020-groundglass opacitie obscured by motion artifact CTA 07/07/2021-interstitial and groundglass opacities with  significant progression. Chest x-ray 11/12/2021-increased interstitial opacities CT high-resolution 02/22/2022-interstitial lung disease with basilar predominant fibrosis and probable UIP pattern, coronary atherosclerosis. I have reviewed the images personally.  PFTs: 09/02/2019-unable to complete  02/22/2022-unable to complete due to cough  Labs: Hypersensitivity panel 09/01/2018-negative ANA, rheumatoid factor 08/04/2018-negative  Assessment:  Interstitial lung disease Review of CT scan shows ILD dating back to 2020 initially read as indeterminate but now in probable UIP pattern.  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 Presence of digial clubbing supports the diagnosis of IPF. After detailed discussion about findings and treatment with antifibrotic's.  We have agreed to initiate pirfenidone therapy.    After long discussion today about side effects he has agreed to start the pirfenidone.  She is currently on prednisone which she will start tapering as its not beneficial in this setting.  She will reduce dose to 15 mg a day and then to 10 mg a day and hold at that dose until return to clinic.  Plan/Recommendations: Labs for monitoring Start pirfenidone Prednisone taper  Marshell Garfinkel MD [place] [facility] 04/11/2022, 9:44 AM  CC: Delene Ruffini, MD

## 2022-04-11 NOTE — Patient Instructions (Signed)
After discussion today we have agreed to start the pirfenidone I will check with pharmacy to make sure they get a shipments Will order hepatic panel monthly starting in January Follow-up in 3 months

## 2022-04-11 NOTE — Addendum Note (Signed)
Addended by: Elton Sin on: 04/11/2022 11:22 AM   Modules accepted: Orders

## 2022-04-24 ENCOUNTER — Telehealth: Payer: Self-pay | Admitting: Pharmacist

## 2022-04-24 DIAGNOSIS — Z7189 Other specified counseling: Secondary | ICD-10-CM

## 2022-04-24 DIAGNOSIS — J849 Interstitial pulmonary disease, unspecified: Secondary | ICD-10-CM

## 2022-04-24 NOTE — Telephone Encounter (Addendum)
Called patient using Spanish interpreter [phone]. Per telephone encounter from 03/06/22, patient had stated to Genia Del, PharmD candidate, that she had received medication. Today, patient states she did not receive medication and did not complete onboarding call.  I conference called with patient, interpreter, and [employer] to complete onboarding call and scheduled with [facility].  First shipment of Esbriet is scheduled for 05/09/2022. Order # [id number]  Will send refill for 90 day supply to [facility] after 05/09/2022  Reviewed dosing and directions with patient again today.  Knox Saliva, PharmD, MPH, BCPS, CPP Clinical Pharmacist (Rheumatology and Pulmonology)  ----- Message from Marshell Garfinkel, MD sent at 04/11/2022 10:02 AM EST ----- After discussion today she has agreed to start the pirfenidone.  She tells me that she did not receive any shipment yet.  Can you make sure that she gets the medication.  Thank you.

## 2022-05-08 NOTE — Telephone Encounter (Signed)
Gay Filler from [facility] needs clarification of Esbriet RX. Gay Filler phone number is [phone].

## 2022-05-08 NOTE — Telephone Encounter (Signed)
Returned call to [facility] pharmacy regarding Esbriet rx. They were clarifying if patient needed titration dose. Advised that she did. They will set up delivery for tomorrow, 05/09/2022  Knox Saliva, PharmD, MPH, BCPS, CPP Clinical Pharmacist (Rheumatology and Pulmonology)

## 2022-05-10 ENCOUNTER — Other Ambulatory Visit (HOSPITAL_COMMUNITY): Payer: Self-pay

## 2022-05-10 ENCOUNTER — Other Ambulatory Visit: Payer: Self-pay | Admitting: Internal Medicine

## 2022-05-10 DIAGNOSIS — K219 Gastro-esophageal reflux disease without esophagitis: Secondary | ICD-10-CM

## 2022-05-13 ENCOUNTER — Other Ambulatory Visit (HOSPITAL_COMMUNITY): Payer: Self-pay

## 2022-05-13 MED ORDER — PANTOPRAZOLE SODIUM 20 MG PO TBEC
20.0000 mg | DELAYED_RELEASE_TABLET | Freq: Every day | ORAL | 2 refills | Status: DC
  Filled 2022-05-13 – 2022-06-04 (×2): qty 90, 90d supply, fill #0
  Filled 2022-07-31: qty 90, 90d supply, fill #1

## 2022-05-16 MED ORDER — PIRFENIDONE 267 MG PO TABS: 801.0000 mg | ORAL_TABLET | Freq: Three times a day (TID) | ORAL | 1 refills | Status: DC

## 2022-05-16 NOTE — Telephone Encounter (Signed)
Rx for maintenance dose has been sent to [facility] today  Maintenance dose of Esbriet: '801mg'$  (3 tabs) three times daily with meals  Knox Saliva, PharmD, MPH, BCPS, CPP Clinical Pharmacist (Rheumatology and Pulmonology)

## 2022-05-17 NOTE — Telephone Encounter (Signed)
Patient is on call list to be scheduled in March- march schedules should be published by end of next week.

## 2022-05-27 ENCOUNTER — Other Ambulatory Visit (HOSPITAL_COMMUNITY): Payer: Self-pay

## 2022-06-04 ENCOUNTER — Other Ambulatory Visit (HOSPITAL_COMMUNITY): Payer: Self-pay

## 2022-06-14 ENCOUNTER — Other Ambulatory Visit (INDEPENDENT_AMBULATORY_CARE_PROVIDER_SITE_OTHER): Payer: No Typology Code available for payment source

## 2022-06-14 DIAGNOSIS — Z5181 Encounter for therapeutic drug level monitoring: Secondary | ICD-10-CM

## 2022-06-14 LAB — HEPATIC FUNCTION PANEL
ALT: 21 U/L (ref 0–35)
AST: 25 U/L (ref 0–37)
Albumin: 4.1 g/dL (ref 3.5–5.2)
Alkaline Phosphatase: 103 U/L (ref 39–117)
Bilirubin, Direct: 0.1 mg/dL (ref 0.0–0.3)
Total Bilirubin: 0.4 mg/dL (ref 0.2–1.2)
Total Protein: 7.7 g/dL (ref 6.0–8.3)

## 2022-07-10 ENCOUNTER — Ambulatory Visit (INDEPENDENT_AMBULATORY_CARE_PROVIDER_SITE_OTHER): Payer: No Typology Code available for payment source | Admitting: Pulmonary Disease

## 2022-07-10 ENCOUNTER — Encounter: Payer: Self-pay | Admitting: Pulmonary Disease

## 2022-07-10 VITALS — BP 136/80 | HR 82 | Temp 98.3°F | Ht 60.0 in | Wt 157.2 lb

## 2022-07-10 DIAGNOSIS — R0602 Shortness of breath: Secondary | ICD-10-CM

## 2022-07-10 DIAGNOSIS — J849 Interstitial pulmonary disease, unspecified: Secondary | ICD-10-CM

## 2022-07-10 DIAGNOSIS — Z5181 Encounter for therapeutic drug level monitoring: Secondary | ICD-10-CM

## 2022-07-10 NOTE — Progress Notes (Signed)
Olivia [name]    [id number]    Jun 20, 1965  Primary Care Physician:Gawaluck, Rachell Cipro, MD  Referring Physician: Delene Ruffini, MD [address] [place],  [place] [address]  Chief complaint: Follow-up for interstitial lung disease  HPI: 57 y.o. who  has a past medical history of Acute respiratory distress (03/27/2020), Elevated blood pressure reading (12/21/2018), GERD (gastroesophageal reflux disease), Healthcare maintenance (06/28/2019), History of COVID-19 (09/02/2019), Preventative health care (01/15/2013), Pulmonary fibrosis ([place]), and Screening breast examination (11/26/2018).   Cough started in 2019 after she had a fall.  Was seen in 2020 with a CT scan showing interstitial lung disease and was referred to pulmonary clinic.  She has been placed on prednisone since then with minimal improvement in symptoms.  Recent chest x-ray shows worsening ILD and has been referred to ILD clinic for further evaluation  Complains of chronic cough, chest congestion and dyspnea for the past 3 years.  Denies any joint pain or rash.  She has occasional difficulty swallowing.  Has dry mouth, dry eyes.  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  No feather pillows or comforters ILD questionnaire 12/27/2021-negative Smoking history: Never smoker Travel history: Immigrated from [place] in 2000.  No significant recent travel Relevant family history: Sister has asthma.  Interim history: Started pirfenidone on end of Jan 2024.  When she got up to 3 pills 3 times a day she developed fatigue and stomach upset and burning sensation in the throat and is now back to 3 pills 2 times a day  Continues to have chronic cough and dyspnea on exertion.  Prednisone is down to 10 mg.  She developed increased dyspnea when the dose got below to 5 mg/day.   Outpatient  Encounter Medications as of 07/10/2022  Medication Sig   albuterol (VENTOLIN HFA) 108 (90 Base) MCG/ACT inhaler Inhale 2 puffs into the lungs every 6 (six) hours as needed for wheezing or shortness of breath.   Dextromethorphan-guaiFENesin (MUCINEX DM PO) Take by mouth in the morning and at bedtime.   gabapentin (NEURONTIN) 300 MG capsule Take 1 capsule (300 mg total) by mouth 2 (two) times daily.   Multiple Vitamins-Minerals (MULTIVITAMIN WITH MINERALS) tablet Take 1 tablet by mouth daily.   pantoprazole (PROTONIX) 20 MG tablet Take 1 tablet (20 mg total) by mouth daily.   Pirfenidone 267 MG TABS Take 3 tablets (801 mg total) by mouth 3 (three) times daily with meals.   predniSONE (DELTASONE) 10 MG tablet Take 2 tablets daily with breakfast until better then one daily   No facility-administered encounter medications on file as of 07/10/2022.   Physical Exam: Blood pressure 136/80, pulse 82, temperature 98.3 F (36.8 C), temperature source Oral, height 5' (1.524 m), weight 157 lb 3.2 oz (71.3 kg), SpO2 96 %. Gen:      No acute distress HEENT:  EOMI, sclera anicteric Neck:     No masses; no thyromegaly Lungs:    Bibasal crackles CV:         Regular rate and rhythm; no murmurs Abd:      + bowel sounds; soft, non-tender; no palpable masses, no distension Ext:    No edema; adequate peripheral perfusion Skin:      Warm and dry; no rash Neuro: alert and oriented x 3 Psych: normal mood and affect   Data Reviewed: Imaging:  High-resolution CT 06/25/2018-widespread groundglass attenuation in the mid to lower lungs with nodular appearance.  Indeterminate for UIP. High-resolution CT 07/09/2019-similar findings of interstitial lung disease as before with slight improvement CTA 03/27/2020-groundglass opacitie obscured by motion artifact CTA 07/07/2021-interstitial and groundglass opacities with significant progression. Chest x-ray 11/12/2021-increased interstitial opacities CT high-resolution  02/22/2022-interstitial lung disease with basilar predominant fibrosis and probable UIP pattern, coronary atherosclerosis. I have reviewed the images personally.  PFTs: 09/02/2019-unable to complete  02/22/2022-unable to complete due to cough  Labs: Hypersensitivity panel 09/01/2018-negative ANA, rheumatoid factor 08/04/2018-negative  Assessment:  Interstitial lung disease Review of CT scan shows ILD dating back to 2020 initially read as indeterminate but now in probable UIP pattern.  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 Presence of digial clubbing supports the diagnosis of IPF. After detailed discussion about findings and treatment with antifibrotic's.  We have agreed to initiate pirfenidone therapy.    Currently at 3 pills 2 times a day.  Try to increase the dose to full and sees how she can tolerate. Check hepatic panel, proBNP and monthly liver tests for monitoring  Prednisone is down to 10 mg/day.  I will hold at that dose for now as she has symptoms at a lower dose.  Plan/Recommendations: Labs for monitoring Continue pirfenidone Prednisone at '10mg'$ /day  Marshell Garfinkel MD [place] [facility] 07/10/2022, 4:05 PM  CC: Delene Ruffini, MD

## 2022-07-10 NOTE — Patient Instructions (Signed)
Continue the Esbriet medication Will check some labs today and monthly Can use Tylenol and ibuprofen over-the-counter for pain Follow-up in 3 months

## 2022-07-11 ENCOUNTER — Other Ambulatory Visit (INDEPENDENT_AMBULATORY_CARE_PROVIDER_SITE_OTHER): Payer: No Typology Code available for payment source

## 2022-07-11 DIAGNOSIS — R0602 Shortness of breath: Secondary | ICD-10-CM

## 2022-07-11 DIAGNOSIS — Z5181 Encounter for therapeutic drug level monitoring: Secondary | ICD-10-CM

## 2022-07-11 LAB — COMPREHENSIVE METABOLIC PANEL
ALT: 20 U/L (ref 0–35)
AST: 24 U/L (ref 0–37)
Albumin: 3.8 g/dL (ref 3.5–5.2)
Alkaline Phosphatase: 93 U/L (ref 39–117)
BUN: 14 mg/dL (ref 6–23)
CO2: 25 mEq/L (ref 19–32)
Calcium: 9.9 mg/dL (ref 8.4–10.5)
Chloride: 104 mEq/L (ref 96–112)
Creatinine, Ser: 0.53 mg/dL (ref 0.40–1.20)
GFR: 102.99 mL/min (ref >60.00)
Glucose, Bld: 112 mg/dL — ABNORMAL HIGH (ref 70–99)
Potassium: 4.3 mEq/L (ref 3.5–5.1)
Sodium: 137 mEq/L (ref 135–145)
Total Bilirubin: 0.3 mg/dL (ref 0.2–1.2)
Total Protein: 7.6 g/dL (ref 6.0–8.3)

## 2022-07-11 LAB — HEPATIC FUNCTION PANEL
ALT: 20 U/L (ref 0–35)
AST: 24 U/L (ref 0–37)
Albumin: 3.8 g/dL (ref 3.5–5.2)
Alkaline Phosphatase: 93 U/L (ref 39–117)
Bilirubin, Direct: 0.1 mg/dL (ref 0.0–0.3)
Total Bilirubin: 0.3 mg/dL (ref 0.2–1.2)
Total Protein: 7.6 g/dL (ref 6.0–8.3)

## 2022-07-11 NOTE — Addendum Note (Signed)
Addended by: Suzzanne Cloud E on: 07/11/2022 02:23 PM   Modules accepted: Orders

## 2022-07-12 LAB — PRO B NATRIURETIC PEPTIDE: NT-Pro BNP: <36 pg/mL (ref 0–287)

## 2022-07-26 ENCOUNTER — Other Ambulatory Visit: Payer: Self-pay

## 2022-07-31 ENCOUNTER — Other Ambulatory Visit: Payer: Self-pay | Admitting: Internal Medicine

## 2022-07-31 ENCOUNTER — Other Ambulatory Visit (HOSPITAL_COMMUNITY): Payer: Self-pay

## 2022-07-31 MED ORDER — PREDNISONE 10 MG PO TABS
10.0000 mg | ORAL_TABLET | Freq: Every day | ORAL | 5 refills | Status: DC
  Filled 2022-07-31: qty 30, 30d supply, fill #0

## 2022-08-01 ENCOUNTER — Other Ambulatory Visit: Payer: Self-pay | Admitting: Pharmacist

## 2022-08-01 DIAGNOSIS — J849 Interstitial pulmonary disease, unspecified: Secondary | ICD-10-CM

## 2022-08-01 MED ORDER — PIRFENIDONE 267 MG PO TABS: 801.0000 mg | ORAL_TABLET | Freq: Three times a day (TID) | ORAL | 1 refills | Status: DC

## 2022-08-01 NOTE — Telephone Encounter (Signed)
Refill sent for ESBRIET to [facility] ([facility]) for Esbriet: [phone]  Dose: 801 mg three times daily  Last OV: 07/10/22 Provider: Dr. Vaughan Browner  Next OV: 3 months (not yet scheduled)  LFTs on 07/11/22 wnl  Knox Saliva, PharmD, MPH, BCPS Clinical Pharmacist (Rheumatology and Pulmonology)

## 2022-08-09 ENCOUNTER — Other Ambulatory Visit (INDEPENDENT_AMBULATORY_CARE_PROVIDER_SITE_OTHER): Payer: No Typology Code available for payment source

## 2022-08-09 ENCOUNTER — Other Ambulatory Visit (HOSPITAL_COMMUNITY): Payer: Self-pay

## 2022-08-09 DIAGNOSIS — Z5181 Encounter for therapeutic drug level monitoring: Secondary | ICD-10-CM

## 2022-08-09 LAB — HEPATIC FUNCTION PANEL
ALT: 32 U/L (ref 0–35)
AST: 37 U/L (ref 0–37)
Albumin: 4.1 g/dL (ref 3.5–5.2)
Alkaline Phosphatase: 86 U/L (ref 39–117)
Bilirubin, Direct: 0.1 mg/dL (ref 0.0–0.3)
Total Bilirubin: 0.4 mg/dL (ref 0.2–1.2)
Total Protein: 7.7 g/dL (ref 6.0–8.3)

## 2022-09-04 ENCOUNTER — Encounter: Payer: Self-pay | Admitting: *Deleted

## 2022-09-13 ENCOUNTER — Other Ambulatory Visit (INDEPENDENT_AMBULATORY_CARE_PROVIDER_SITE_OTHER): Payer: No Typology Code available for payment source

## 2022-09-13 DIAGNOSIS — Z5181 Encounter for therapeutic drug level monitoring: Secondary | ICD-10-CM

## 2022-09-13 LAB — HEPATIC FUNCTION PANEL
ALT: 16 U/L (ref 0–35)
AST: 24 U/L (ref 0–37)
Albumin: 3.8 g/dL (ref 3.5–5.2)
Alkaline Phosphatase: 80 U/L (ref 39–117)
Bilirubin, Direct: 0.1 mg/dL (ref 0.0–0.3)
Total Bilirubin: 0.5 mg/dL (ref 0.2–1.2)
Total Protein: 7.5 g/dL (ref 6.0–8.3)

## 2022-10-09 ENCOUNTER — Encounter: Payer: Self-pay | Admitting: *Deleted

## 2022-10-10 ENCOUNTER — Encounter: Payer: Self-pay | Admitting: Pulmonary Disease

## 2022-10-10 ENCOUNTER — Ambulatory Visit (INDEPENDENT_AMBULATORY_CARE_PROVIDER_SITE_OTHER): Payer: No Typology Code available for payment source | Admitting: Pulmonary Disease

## 2022-10-10 VITALS — BP 112/64 | HR 92 | Temp 98.3°F | Ht 59.0 in | Wt 152.2 lb

## 2022-10-10 DIAGNOSIS — J849 Interstitial pulmonary disease, unspecified: Secondary | ICD-10-CM

## 2022-10-10 DIAGNOSIS — R0602 Shortness of breath: Secondary | ICD-10-CM

## 2022-10-10 DIAGNOSIS — Z5181 Encounter for therapeutic drug level monitoring: Secondary | ICD-10-CM

## 2022-10-10 LAB — COMPREHENSIVE METABOLIC PANEL
ALT: 15 U/L (ref 0–35)
AST: 22 U/L (ref 0–37)
Albumin: 3.9 g/dL (ref 3.5–5.2)
Alkaline Phosphatase: 89 U/L (ref 39–117)
BUN: 7 mg/dL (ref 6–23)
CO2: 28 mEq/L (ref 19–32)
Calcium: 9.6 mg/dL (ref 8.4–10.5)
Chloride: 104 mEq/L (ref 96–112)
Creatinine, Ser: 0.48 mg/dL (ref 0.40–1.20)
GFR: 105.3 mL/min (ref >60.00)
Glucose, Bld: 120 mg/dL — ABNORMAL HIGH (ref 70–99)
Potassium: 3.5 mEq/L (ref 3.5–5.1)
Sodium: 141 mEq/L (ref 135–145)
Total Bilirubin: 0.4 mg/dL (ref 0.2–1.2)
Total Protein: 7.9 g/dL (ref 6.0–8.3)

## 2022-10-10 NOTE — Patient Instructions (Signed)
Will get a comprehensive metabolic panel and proBNP for dyspnea and monitoring Will order high-resolution CT in 6 months Return to clinic in 6 months

## 2022-10-10 NOTE — Progress Notes (Signed)
Olivia [name]    [id number]    08-Dec-1965  Primary Care Physician:Gawaluck, Diannia Ruder, MD (Inactive)  Referring Physician: No referring provider defined for this encounter.  Chief complaint: Follow-up for interstitial lung disease  HPI: 57 y.o. who  has a past medical history of Acute respiratory distress (03/27/2020), Elevated blood pressure reading (12/21/2018), GERD (gastroesophageal reflux disease), Healthcare maintenance (06/28/2019), History of COVID-19 (09/02/2019), Preventative health care (01/15/2013), Pulmonary fibrosis (HCC), and Screening breast examination (11/26/2018).   Cough started in 2019 after she had a fall.  Was seen in 2020 with a CT scan showing interstitial lung disease and was referred to pulmonary clinic.  She has been placed on prednisone since then with minimal improvement in symptoms.  Recent chest x-ray shows worsening ILD and has been referred to ILD clinic for further evaluation  Complains of chronic cough, chest congestion and dyspnea for the past 3 years.  Denies any joint pain or rash.  She has occasional difficulty swallowing.  Has dry mouth, dry eyes.  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  Currently stays at home Exposures: No mold, hot tub, Jacuzzi.  No feather pillows or comforters ILD questionnaire 12/27/2021-negative Smoking history: Never smoker Travel history: Immigrated from [place] in 2000.  No significant recent travel Relevant family history: Sister has asthma.  Interim history: Started pirfenidone on end of Jan 2024.  So far she is tolerating it well.  She skips some evening doses as she does not take meals Continues to have chronic cough and dyspnea on exertion.  Off prednisone a couple of weeks ago.  She has not noticed any difference in her breathing as she came off the steroids Last week she had viral like illness with cough and fatigue but is recovered  now.  Outpatient Encounter Medications as of 10/10/2022  Medication Sig   albuterol (VENTOLIN HFA) 108 (90 Base) MCG/ACT inhaler Inhale 2 puffs into the lungs every 6 (six) hours as needed for wheezing or shortness of breath.   Dextromethorphan-guaiFENesin (MUCINEX DM PO) Take by mouth in the morning and at bedtime.   gabapentin (NEURONTIN) 300 MG capsule Take 1 capsule (300 mg total) by mouth 2 (two) times daily.   Multiple Vitamins-Minerals (MULTIVITAMIN WITH MINERALS) tablet Take 1 tablet by mouth daily.   pantoprazole (PROTONIX) 20 MG tablet Take 1 tablet (20 mg total) by mouth daily.   Pirfenidone 267 MG TABS Take 3 tablets (801 mg total) by mouth 3 (three) times daily with meals.   predniSONE (DELTASONE) 10 MG tablet Take 1 tablet (10 mg total) by mouth daily with breakfast.   No facility-administered encounter medications on file as of 10/10/2022.   Physical Exam: Blood pressure 136/80, pulse 82, temperature 98.3 F (36.8 C), temperature source Oral, height 5' (1.524 m), weight 157 lb 3.2 oz (71.3 kg), SpO2 96 %. Gen:      No acute distress HEENT:  EOMI, sclera anicteric Neck:     No masses; no thyromegaly Lungs:    Bibasal crackles CV:         Regular rate and rhythm; no murmurs Abd:      + bowel sounds; soft, non-tender; no palpable masses, no distension Ext:    No edema; adequate peripheral perfusion Skin:      Warm and dry; no rash Neuro: alert and oriented x 3 Psych: normal mood and affect   Data Reviewed: Imaging: High-resolution  CT 06/25/2018-widespread groundglass attenuation in the mid to lower lungs with nodular appearance.  Indeterminate for UIP. High-resolution CT 07/09/2019-similar findings of interstitial lung disease as before with slight improvement CTA 03/27/2020-groundglass opacitie obscured by motion artifact CTA 07/07/2021-interstitial and groundglass opacities with significant progression. Chest x-ray 11/12/2021-increased interstitial opacities CT high-resolution  02/22/2022-interstitial lung disease with basilar predominant fibrosis and probable UIP pattern, coronary atherosclerosis. I have reviewed the images personally.  PFTs: 09/02/2019-unable to complete  02/22/2022-unable to complete due to cough  Labs: Hypersensitivity panel 09/01/2018-negative ANA, rheumatoid factor 08/04/2018-negative  Assessment:  Interstitial lung disease Review of CT scan shows ILD dating back to 2020 initially read as indeterminate but now in probable UIP pattern.  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 Presence of digial clubbing supports the diagnosis of IPF. After detailed discussion about findings and treatment with anti fibrotics.  She is now on pirfenidone therapy and is tolerating it well.  Off prednisone Check labs for monitoring Follow-up high-res CT in 6 months.  She could not complete PFTs in the past due to cough.  GERD Has a small hiatal hernia.  Continue Protonix and Tums  Plan/Recommendations: Labs for monitoring Continue pirfenidone CT in 6 months  Chilton Greathouse MD [facility] 10/10/2022, 2:59 PM  CC: No ref. provider found

## 2022-10-11 LAB — PRO B NATRIURETIC PEPTIDE: NT-Pro BNP: 84 pg/mL (ref 0–287)

## 2022-11-12 ENCOUNTER — Other Ambulatory Visit: Payer: Self-pay | Admitting: Pharmacist

## 2022-11-12 DIAGNOSIS — J849 Interstitial pulmonary disease, unspecified: Secondary | ICD-10-CM

## 2022-11-12 MED ORDER — PIRFENIDONE 267 MG PO TABS: 801.0000 mg | ORAL_TABLET | Freq: Three times a day (TID) | ORAL | 1 refills | Status: DC

## 2022-11-12 NOTE — Telephone Encounter (Signed)
Refill sent for PIRFENIDONE to [facility] ([facility]) for [place]: [phone]  Dose: 801 mg three times daily  Last OV: 10/10/22 Provider: Dr. Isaiah Serge  Next OV: 6 months, not yet scheduled  LFTs on 10/10/22 wnl  Chesley Mires, PharmD, MPH, BCPS Clinical Pharmacist (Rheumatology and Pulmonology)

## 2023-02-19 ENCOUNTER — Emergency Department (HOSPITAL_COMMUNITY): Payer: MEDICAID

## 2023-02-19 ENCOUNTER — Inpatient Hospital Stay (HOSPITAL_COMMUNITY)
Admission: EM | Admit: 2023-02-19 | Discharge: 2023-02-27 | DRG: 196 | Disposition: A | Discharge status: Home / Self Care | Payer: MEDICAID | Attending: Infectious Diseases | Admitting: Infectious Diseases

## 2023-02-19 ENCOUNTER — Other Ambulatory Visit: Payer: Self-pay

## 2023-02-19 ENCOUNTER — Encounter (HOSPITAL_COMMUNITY): Payer: Self-pay | Admitting: Emergency Medicine

## 2023-02-19 DIAGNOSIS — Z9981 Dependence on supplemental oxygen: Secondary | ICD-10-CM

## 2023-02-19 DIAGNOSIS — J8489 Other specified interstitial pulmonary diseases: Principal | ICD-10-CM | POA: Diagnosis present

## 2023-02-19 DIAGNOSIS — E876 Hypokalemia: Secondary | ICD-10-CM | POA: Diagnosis not present

## 2023-02-19 DIAGNOSIS — J470 Bronchiectasis with acute lower respiratory infection: Secondary | ICD-10-CM | POA: Diagnosis present

## 2023-02-19 DIAGNOSIS — Z1152 Encounter for screening for COVID-19: Secondary | ICD-10-CM

## 2023-02-19 DIAGNOSIS — J810 Acute pulmonary edema: Secondary | ICD-10-CM | POA: Diagnosis not present

## 2023-02-19 DIAGNOSIS — J9621 Acute and chronic respiratory failure with hypoxia: Secondary | ICD-10-CM | POA: Diagnosis present

## 2023-02-19 DIAGNOSIS — Z8616 Personal history of COVID-19: Secondary | ICD-10-CM

## 2023-02-19 DIAGNOSIS — Z825 Family history of asthma and other chronic lower respiratory diseases: Secondary | ICD-10-CM

## 2023-02-19 DIAGNOSIS — R634 Abnormal weight loss: Secondary | ICD-10-CM | POA: Diagnosis present

## 2023-02-19 DIAGNOSIS — J849 Interstitial pulmonary disease, unspecified: Secondary | ICD-10-CM

## 2023-02-19 DIAGNOSIS — E440 Moderate protein-calorie malnutrition: Secondary | ICD-10-CM | POA: Diagnosis present

## 2023-02-19 DIAGNOSIS — Z6828 Body mass index (BMI) 28.0-28.9, adult: Secondary | ICD-10-CM

## 2023-02-19 DIAGNOSIS — R0602 Shortness of breath: Principal | ICD-10-CM

## 2023-02-19 DIAGNOSIS — Z23 Encounter for immunization: Secondary | ICD-10-CM

## 2023-02-19 DIAGNOSIS — I2721 Secondary pulmonary arterial hypertension: Secondary | ICD-10-CM | POA: Diagnosis present

## 2023-02-19 DIAGNOSIS — Z888 Allergy status to other drugs, medicaments and biological substances status: Secondary | ICD-10-CM

## 2023-02-19 DIAGNOSIS — R64 Cachexia: Secondary | ICD-10-CM | POA: Diagnosis present

## 2023-02-19 DIAGNOSIS — Z79899 Other long term (current) drug therapy: Secondary | ICD-10-CM

## 2023-02-19 DIAGNOSIS — R300 Dysuria: Secondary | ICD-10-CM | POA: Diagnosis not present

## 2023-02-19 DIAGNOSIS — Z833 Family history of diabetes mellitus: Secondary | ICD-10-CM

## 2023-02-19 DIAGNOSIS — Z91199 Patient's noncompliance with other medical treatment and regimen due to unspecified reason: Secondary | ICD-10-CM

## 2023-02-19 DIAGNOSIS — K219 Gastro-esophageal reflux disease without esophagitis: Secondary | ICD-10-CM | POA: Diagnosis present

## 2023-02-19 DIAGNOSIS — J84112 Idiopathic pulmonary fibrosis: Secondary | ICD-10-CM | POA: Diagnosis present

## 2023-02-19 DIAGNOSIS — Z8249 Family history of ischemic heart disease and other diseases of the circulatory system: Secondary | ICD-10-CM

## 2023-02-19 LAB — BASIC METABOLIC PANEL
Anion gap: 10 (ref 5–15)
BUN: 7 mg/dL (ref 6–20)
CO2: 26 mmol/L (ref 22–32)
Calcium: 9.7 mg/dL (ref 8.9–10.3)
Chloride: 103 mmol/L (ref 98–111)
Creatinine, Ser: 0.46 mg/dL (ref 0.44–1.00)
GFR, Estimated: >60 mL/min (ref >60)
Glucose, Bld: 114 mg/dL — ABNORMAL HIGH (ref 70–99)
Potassium: 4.1 mmol/L (ref 3.5–5.1)
Sodium: 139 mmol/L (ref 135–145)

## 2023-02-19 LAB — CBC
HCT: 43.1 % (ref 36.0–46.0)
Hemoglobin: 14.5 g/dL (ref 12.0–15.0)
MCH: 31.3 pg (ref 26.0–34.0)
MCHC: 33.6 g/dL (ref 30.0–36.0)
MCV: 92.9 fL (ref 80.0–100.0)
Platelets: 322 10*3/uL (ref 150–400)
RBC: 4.64 MIL/uL (ref 3.87–5.11)
RDW: 12.3 % (ref 11.5–15.5)
WBC: 10.2 10*3/uL (ref 4.0–10.5)
nRBC: 0 % (ref 0.0–0.2)

## 2023-02-19 LAB — BRAIN NATRIURETIC PEPTIDE: B Natriuretic Peptide: 17.8 pg/mL (ref 0.0–100.0)

## 2023-02-19 LAB — RESP PANEL BY RT-PCR (RSV, FLU A&B, COVID)  RVPGX2
Influenza A by PCR: NEGATIVE
Influenza B by PCR: NEGATIVE
Resp Syncytial Virus by PCR: NEGATIVE
SARS Coronavirus 2 by RT PCR: NEGATIVE

## 2023-02-19 LAB — TROPONIN I (HIGH SENSITIVITY): Troponin I (High Sensitivity): 8 ng/L (ref <18)

## 2023-02-19 MED ORDER — OMEPRAZOLE 20 MG PO CPDR
20.0000 mg | DELAYED_RELEASE_CAPSULE | Freq: Every day | ORAL | Status: DC
  Administered 2023-02-20 – 2023-02-23 (×4): 20 mg via ORAL
  Filled 2023-02-19 (×4): qty 1

## 2023-02-19 MED ORDER — PREDNISONE 20 MG PO TABS
40.0000 mg | ORAL_TABLET | Freq: Every day | ORAL | Status: DC
  Administered 2023-02-20: 40 mg via ORAL
  Filled 2023-02-19: qty 2

## 2023-02-19 MED ORDER — ENSURE ENLIVE PO LIQD
237.0000 mL | Freq: Two times a day (BID) | ORAL | Status: DC
  Administered 2023-02-20: 237 mL via ORAL

## 2023-02-19 MED ORDER — ALBUTEROL SULFATE HFA 108 (90 BASE) MCG/ACT IN AERS
2.0000 | INHALATION_SPRAY | RESPIRATORY_TRACT | Status: DC | PRN
  Administered 2023-02-19 – 2023-02-21 (×5): 2 via RESPIRATORY_TRACT
  Filled 2023-02-19: qty 6.7

## 2023-02-19 MED ORDER — SODIUM CHLORIDE 0.9% FLUSH: 3.0000 mL | INTRAVENOUS | Status: DC | PRN

## 2023-02-19 MED ORDER — ENOXAPARIN SODIUM 40 MG/0.4ML IJ SOSY
40.0000 mg | PREFILLED_SYRINGE | INTRAMUSCULAR | Status: DC
  Administered 2023-02-19 – 2023-02-26 (×8): 40 mg via SUBCUTANEOUS
  Filled 2023-02-19 (×8): qty 0.4

## 2023-02-19 MED ORDER — PNEUMOCOCCAL 20-VAL CONJ VACC 0.5 ML IM SUSY
0.5000 mL | PREFILLED_SYRINGE | INTRAMUSCULAR | Status: AC
  Administered 2023-02-20: 0.5 mL via INTRAMUSCULAR
  Filled 2023-02-19: qty 0.5

## 2023-02-19 MED ORDER — INFLUENZA VIRUS VACC SPLIT PF (FLUZONE) 0.5 ML IM SUSY
0.5000 mL | PREFILLED_SYRINGE | INTRAMUSCULAR | Status: AC
  Administered 2023-02-20: 0.5 mL via INTRAMUSCULAR
  Filled 2023-02-19: qty 0.5

## 2023-02-19 MED ORDER — IOHEXOL 350 MG/ML SOLN
75.0000 mL | Freq: Once | INTRAVENOUS | Status: AC | PRN
  Administered 2023-02-19: 75 mL via INTRAVENOUS

## 2023-02-19 NOTE — ED Provider Notes (Signed)
   ED Course / MDM   Clinical Course as of 02/19/23 2048  Wed Feb 19, 2023  1621 Received sign out from Dr. Karene Fry presenting with increased shortness of breath. Pending PE scan and labs. With worsening hypoxia on baseline O2.  [WS]  2047 CTA chest with no clear focal infiltrate.  Also with no pneumothorax.  Does show progression of underlying lung disease.  Given worsening hypoxia over the past week discussed with the internal medicine team who will admit the patient [WS]    Clinical Course User Index [WS] Lonell Grandchild, MD   Medical Decision Making Amount and/or Complexity of Data Reviewed Labs: ordered. Radiology: ordered.  Risk Prescription drug management. Decision regarding hospitalization.          Lonell Grandchild, MD 02/19/23 2048

## 2023-02-19 NOTE — ED Triage Notes (Addendum)
Pt came in POV for SOB.SInce yesterday Pt 02 sat is dropping to 54% RA.  She states it drops to 74% on 2L when ambulating.  She is at 2L at baseline normally 88-90%, with hx of pulmonary fibrosis.  CP is 10/10 with cough, no pain otherwise.

## 2023-02-19 NOTE — Hospital Course (Addendum)
Olivia [name] is a 57 y.o. with a PMH of idiopathic pulmonary fibrosis who presented with worsening dyspnea of one month duration. She had stopped pirfenidone due to worsening GI side effects and weakness and was not on prednisone. Prior to this she would use 2-3L of Oxygen at night only. On admission she reported requiring 2-3L at all times. She also reported chills, myalgias, fatigue and decreased appetite and a unintentional weight loss of over 30 lbs in the last three months. She was admitted for acute on chronic hypoxic respiratory failure.   Acute on chronic hypoxic respiratory failure Nonspecific interstitial pneumonia Follows [facility]. Had not seen them since stopping her medications. CTA not concerning for PE, but pulmonary fibrosis may be worsening slightly. CXR worrisome for infection/edema in addition to lung fibrosis. However, on exam she did not appear fluid overloaded, does not have JVD, very mild edema. Echo with normal RV function, IVC with >50% variability. RVP was negative for covid, RSV, flu.  PCCM was consulted and was started of Solumedrol 100 mg x 3 days and Prednisone 60 mg daily until 10/24 until she transitioned to Prednisone 40 mg daily. Tessalon pearls, Rubitussin DM, Chloraseptic spray, flutter valve, and incentive spirometry helped her with her cough. O2 sat GOAL 88-92%  She is to: -Continue on Prednisone 40 mg daily until she follows with PCCM in 2 weeks -FU with PCCM in 2 weeks  -Continue with supportive measures as mentioned above  -Consider RFP at next follow up.   Dyspepsia #Hx of GERD Pt complained of epigastric pain. Omeprazole discontinued. Pt took pantoprazole 40mg  daily during hospitalization. -Cw Pantoprazole 40mg  daily  -Please ensure patient had switched to pantoprazole.   Unintentional weight loss Likely due to increased work of breathing. RD was consulted and was advised to use boost breeze BID and snacks TID. She is instructed to buy  Ensure Max Protein BID and snack three times a day.  -Please continue to monitor  Dysuria On day of discharge pt complained of dysuria. UA was negative for UTI.  -Please follow up during her next visit should symptoms remain.

## 2023-02-19 NOTE — H&P (Signed)
Date: 02/19/2023               Patient Name:  Olivia [name] MRN: [id number]  DOB: 1966/04/16 Age / Sex: 57 y.o., female   PCP: Faith Rogue, DO         Medical Service: Internal Medicine Teaching Service         Attending Physician: Dr. Dickie La, MD      First Contact: Dr. Monna Fam, MD Pager [phone]    Second Contact: Dr. Marrianne Mood, MD Pager [phone]         After Hours (After 5p/  First Contact Pager: [phone]  weekends / holidays): Second Contact Pager: [phone]   SUBJECTIVE   Chief Complaint: dyspnea, hypoxia  History of Present Illness:  Patient is a 57 year old woman with past medical history significant for idiopathic pulmonary fibrosis presenting with shortness of breath and worsening oxygen saturations. Patient reports for the past month she's had an increasing oxygen requirement at home to maintain O2 saturations. She usually uses 2L [place] at night, now is requiring 2-3 liters all the time. She reports O2 sats in the 70s while ambulating with oxygen, and sats in the 50s while ambulating without oxygen. She has had a cough productive of clear sputum and intermittent substernal chest pain. She also reports chills, myalgias, fatigue, and decreased appetite. Also reports an unintentional weight loss of approximately 30 pounds over the last three months. Denies fever, nausea, dysuria. She came to the ED when her husband noted her lips were blue at home.   Notes she had been prescribed perfenidone several months ago, which she stopped taking last month due to GI side effects.   ED Course: HDS, afebrile, 96% on 2L  WBC: 10.2  CXR: suggests infection or edema superimposed on chronic ILD  CT PE: No PE, favor fibrotic NSIP  Past Medical History: Past Medical History:  Diagnosis Date   Acute respiratory distress 03/27/2020   Elevated blood pressure reading 12/21/2018   GERD (gastroesophageal reflux disease)    Healthcare maintenance 06/28/2019   Per  patient's recent discharge summary, FOBT positive in ED. Given her overall prognosis and health condition, will hold off discussing colonoscopy.   History of COVID-19 09/02/2019   05/03/19 - [place] - positive  Did not get mab  Did not get hosp   Preventative health care 01/15/2013   Pulmonary fibrosis (HCC)    Screening breast examination 11/26/2018    Meds:  Current Outpatient Medications  Medication Instructions   gabapentin (NEURONTIN) 100 mg, Oral, 3 times daily   omeprazole (PRILOSEC OTC) 20 mg, Oral, Daily   Pseudoeph-Doxylamine-DM-APAP (NYQUIL PO) 5 mLs, Oral, Nightly    Past Surgical History:  Procedure Laterality Date   CHOLECYSTECTOMY  05/2009   COLONOSCOPY WITH PROPOFOL N/A 01/08/2022   Procedure: COLONOSCOPY WITH PROPOFOL;  Surgeon: Vida Rigger, MD;  Location: [facility];  Service: Gastroenterology;  Laterality: N/A;   ESOPHAGOGASTRODUODENOSCOPY (EGD) WITH PROPOFOL N/A 01/08/2022   Procedure: ESOPHAGOGASTRODUODENOSCOPY (EGD) WITH PROPOFOL;  Surgeon: Vida Rigger, MD;  Location: [facility];  Service: Gastroenterology;  Laterality: N/A;   POLYPECTOMY  01/08/2022   Procedure: POLYPECTOMY;  Surgeon: Vida Rigger, MD;  Location: [facility];  Service: Gastroenterology;;    Social:  Living Situation: home with husband Occupation: none Level of Function: independent in ADLs and iADLs PCP: Faith Rogue, DO Tobacco: none Alcohol: none Drugs: none  Family History: noncontributory  Allergies: Allergies as of 02/19/2023 - Review Complete 02/19/2023  Allergen Reaction Noted  Miralax [polyethylene glycol] Rash 07/09/2021    Review of Systems: A complete ROS was negative except as per HPI.   OBJECTIVE:   Physical Exam: Blood pressure 132/84, pulse 85, temperature 98.3 F (36.8 C), temperature source Oral, resp. rate (!) 24, height 4\' 11"  (1.499 m), weight 63 kg, SpO2 93%.  Constitutional: ill-appearing HENT: normocephalic atraumatic, mucous membranes moist Eyes:  conjunctiva non-erythematous Neck: supple Cardiovascular: regular rate and rhythm, no m/r/g Pulmonary/Chest: significant crackles in bilateral lung fields,  Abdominal: soft, non-tender, non-distended MSK: normal bulk and tone Neurological: alert & oriented x 3, 5/5 strength in bilateral upper and lower extremities, normal gait Skin: warm and dry  Labs: CBC    Component Value Date/Time   WBC 10.2 02/19/2023 1222   RBC 4.64 02/19/2023 1222   HGB 14.5 02/19/2023 1222   HGB 13.8 12/26/2016 1643   HCT 43.1 02/19/2023 1222   HCT 41.1 12/26/2016 1643   PLT 322 02/19/2023 1222   PLT 268 12/26/2016 1643   MCV 92.9 02/19/2023 1222   MCV 89 12/26/2016 1643   MCH 31.3 02/19/2023 1222   MCHC 33.6 02/19/2023 1222   RDW 12.3 02/19/2023 1222   RDW 13.0 12/26/2016 1643   LYMPHSABS 0.5 (L) 07/08/2021 0131   MONOABS 0.1 07/08/2021 0131   EOSABS 0.0 07/08/2021 0131   BASOSABS 0.0 07/08/2021 0131     CMP     Component Value Date/Time   NA 139 02/19/2023 1222   NA 138 05/30/2021 1640   K 4.1 02/19/2023 1222   CL 103 02/19/2023 1222   CO2 26 02/19/2023 1222   GLUCOSE 114 (H) 02/19/2023 1222   BUN 7 02/19/2023 1222   BUN 12 05/30/2021 1640   CREATININE 0.46 02/19/2023 1222   CREATININE 0.52 05/23/2014 0855   CALCIUM 9.7 02/19/2023 1222   PROT 7.9 10/10/2022 1514   ALBUMIN 3.9 10/10/2022 1514   AST 22 10/10/2022 1514   ALT 15 10/10/2022 1514   ALKPHOS 89 10/10/2022 1514   BILITOT 0.4 10/10/2022 1514   GFRNONAA >60 02/19/2023 1222   GFRAA >60 08/22/2017 0546    Imaging: CT Angio Chest PE W and/or Wo Contrast  Result Date: 02/19/2023 CLINICAL DATA:  Shortness of breath EXAM: CT ANGIOGRAPHY CHEST WITH CONTRAST TECHNIQUE: Multidetector CT imaging of the chest was performed using the standard protocol during bolus administration of intravenous contrast. Multiplanar CT image reconstructions and MIPs were obtained to evaluate the vascular anatomy. RADIATION DOSE REDUCTION: This exam was  performed according to the departmental dose-optimization program which includes automated exposure control, adjustment of the mA and/or kV according to patient size and/or use of iterative reconstruction technique. CONTRAST:  75mL OMNIPAQUE IOHEXOL 350 MG/ML SOLN COMPARISON:  Most recent, high-resolution chest CT dated March 02, 2022 FINDINGS: Cardiovascular: No evidence of pulmonary embolus. Normal heart size. No pericardial effusion. Normal caliber thoracic aorta with mild atherosclerotic disease. Dilated main pulmonary artery, measuring up to 3.1 cm. Mediastinum/Nodes: Small hiatal hernia. Thyroid is unremarkable. Mildly enlarged bilateral hilar lymph nodes. Reference right hilar lymph node measuring 12 mm in short axis on series 5, image 69, unchanged when compared with prior and likely reactive. Lungs/Pleura: Central airways are patent. Lower lung and peribronchovascular predominant reticular and ground-glass opacities with associated traction bronchiectasis. Scattered areas of cystic change. Probable slight progression with new mild peribronchovascular reticular and ground-glass opacity seen in the upper lobes, differences in exam technique somewhat limit evaluation. Peribronchovascular no pleural effusion or pneumothorax. Upper Abdomen: Prior cholecystectomy.  No acute abnormality. Musculoskeletal:  No chest wall abnormality. No acute or significant osseous findings. Review of the MIP images confirms the above findings. IMPRESSION: 1. No evidence of pulmonary embolus. 2. Fibrotic interstitial lung disease, favor fibrotic NSIP. Possible slight progression in the bilateral upper lobes when compared with the prior exam, although exam technique limits evaluation. Follow-up with ILD protocol chest CT after resolution of the acute symptoms is recommended for further evaluation. Findings are suggestive of an alternative diagnosis (not UIP) per consensus guidelines: Diagnosis of Idiopathic Pulmonary Fibrosis: An  Official ATS/ERS/JRS/ALAT Clinical Practice Guideline. Am Rosezetta Schlatter Crit Care Med Vol 198, Iss 5, [phone], Jan 04 2017. 3. Dilated main pulmonary artery, findings can be seen in setting of pulmonary hypertension. 4. Aortic Atherosclerosis (ICD10-I70.0). Electronically Signed   By: Allegra Lai M.D.   On: 02/19/2023 19:02   DG Chest 2 View  Result Date: 02/19/2023 CLINICAL DATA:  Constant cough and low oxygen saturations EXAM: CHEST - 2 VIEW COMPARISON:  11/12/2021 FINDINGS: Stable cardiomediastinal silhouette. Pulmonary vascular congestion. Increased hazy bilateral airspace and interstitial opacities greatest in the lower lungs. No pleural effusion or pneumothorax. IMPRESSION: Findings suggestive of infection or edema superimposed on a background of chronic interstitial lung disease. Electronically Signed   By: Minerva Fester M.D.   On: 02/19/2023 15:04    ASSESSMENT & PLAN:   Assessment & Plan by Problem: Principal Problem:   Acute on chronic hypoxic respiratory failure (HCC)   Maryjane L Sanchez-Rojas is a 57 y.o. person living with a history of IPF who presented with dyspnea and admitted for dyspnea on hospital day 0  #Acute on chronic hypoxic respiratory failure #Idiopathic pulmonary fibrosis #Nonspecific interstitial pneumonia Patient has increasing oxygen requirement at rest and on exertion. Stable at present but does desaturate while speaking or ambulating. No evidence of pulmonary edema or bacterial pneumonia. Time course seems to coincide with discontinuation of antifibrotics, which she self discontinued due to GI side effects. Negative Covid, flu, RSV. Results of full respiratory viral panel may help with counseling. 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

## 2023-02-19 NOTE — ED Notes (Signed)
ED TO INPATIENT HANDOFF REPORT  ED Nurse Name and Phone #: [phone]  S Name/Age/Gender Olivia [name] 57 y.o. female Room/Bed: 025C/025C  Code Status   Code Status: Full Code  Home/SNF/Other Home Patient oriented to: self, place, time, and situation Is this baseline? Yes   Triage Complete: Triage complete  Chief Complaint Acute on chronic hypoxic respiratory failure (HCC) [J96.21]  Triage Note Pt came in POV for SOB.SInce yesterday Pt 02 sat is dropping to 54% RA.  She states it drops to 74% on 2L when ambulating.  She is at 2L at baseline normally 88-90%, with hx of pulmonary fibrosis.  CP is 10/10 with cough, no pain otherwise.     Allergies Allergies  Allergen Reactions   Miralax [Polyethylene Glycol] Rash    Level of Care/Admitting Diagnosis ED Disposition     ED Disposition  Admit   Condition  --   Comment  Hospital Area: [facility] [[id number]]  Level of Care: Telemetry Medical [104]  May place patient in observation at [facility] or [facility] if equivalent level of care is available:: No  Covid Evaluation: Asymptomatic - no recent exposure (last 10 days) testing not required  Diagnosis: Acute on chronic hypoxic respiratory failure [facility]) [[id number]]  Admitting Physician: Dickie La [[id number]]  Attending Physician: Dickie La [[id number]]          B Medical/Surgery History Past Medical History:  Diagnosis Date   Acute respiratory distress 03/27/2020   Elevated blood pressure reading 12/21/2018   GERD (gastroesophageal reflux disease)    Healthcare maintenance 06/28/2019   Per patient's recent discharge summary, FOBT positive in ED. Given her overall prognosis and health condition, will hold off discussing colonoscopy.   History of COVID-19 09/02/2019   05/03/19 - [place] - positive  Did not get mab  Did not get hosp   Preventative health care 01/15/2013   Pulmonary fibrosis (HCC)    Screening breast examination 11/26/2018   Past  Surgical History:  Procedure Laterality Date   CHOLECYSTECTOMY  05/2009   COLONOSCOPY WITH PROPOFOL N/A 01/08/2022   Procedure: COLONOSCOPY WITH PROPOFOL;  Surgeon: Vida Rigger, MD;  Location: [facility];  Service: Gastroenterology;  Laterality: N/A;   ESOPHAGOGASTRODUODENOSCOPY (EGD) WITH PROPOFOL N/A 01/08/2022   Procedure: ESOPHAGOGASTRODUODENOSCOPY (EGD) WITH PROPOFOL;  Surgeon: Vida Rigger, MD;  Location: [facility];  Service: Gastroenterology;  Laterality: N/A;   POLYPECTOMY  01/08/2022   Procedure: POLYPECTOMY;  Surgeon: Vida Rigger, MD;  Location: [facility];  Service: Gastroenterology;;     A IV Location/Drains/Wounds Patient Lines/Drains/Airways Status     Active Line/Drains/Airways     Name Placement date Placement time Site Days   Peripheral IV 02/19/23 20 G 1" Anterior;Right Forearm 02/19/23  1315  Forearm  less than 1            Intake/Output Last 24 hours No intake or output data in the 24 hours ending 02/19/23 2105  Labs/Imaging Results for orders placed or performed during the hospital encounter of 02/19/23 (from the past 48 hour(s))  Basic metabolic panel     Status: Abnormal   Collection Time: 02/19/23 12:22 PM  Result Value Ref Range   Sodium 139 135 - 145 mmol/L   Potassium 4.1 3.5 - 5.1 mmol/L   Chloride 103 98 - 111 mmol/L   CO2 26 22 - 32 mmol/L   Glucose, Bld 114 (H) 70 - 99 mg/dL    Comment: Glucose reference range applies only to samples taken after fasting for  at least 8 hours.   BUN 7 6 - 20 mg/dL   Creatinine, Ser 1.61 0.44 - 1.00 mg/dL   Calcium 9.7 8.9 - 09.6 mg/dL   GFR, Estimated >04 >54 mL/min    Comment: (NOTE) Calculated using the CKD-EPI Creatinine Equation (2021)    Anion gap 10 5 - 15    Comment: Performed at [facility], [address], [place], [place] [address]  CBC     Status: None   Collection Time: 02/19/23 12:22 PM  Result Value Ref Range   WBC 10.2 4.0 - 10.5 K/uL   RBC 4.64 3.87 - 5.11 MIL/uL   Hemoglobin  14.5 12.0 - 15.0 g/dL   HCT 91.4 78.2 - 95.6 %   MCV 92.9 80.0 - 100.0 fL   MCH 31.3 26.0 - 34.0 pg   MCHC 33.6 30.0 - 36.0 g/dL   RDW 21.3 08.6 - 57.8 %   Platelets 322 150 - 400 K/uL   nRBC 0.0 0.0 - 0.2 %    Comment: Performed at [facility], [address] [place], [place] [address]  Troponin I (High Sensitivity)     Status: None   Collection Time: 02/19/23  3:23 PM  Result Value Ref Range   Troponin I (High Sensitivity) 8 <18 ng/L    Comment: (NOTE) Elevated high sensitivity troponin I (hsTnI) values and significant  changes across serial measurements may suggest ACS but many other  chronic and acute conditions are known to elevate hsTnI results.  Refer to the "Links" section for chest pain algorithms and additional  guidance. Performed at [facility], [address], [place], [place] [address]   Brain natriuretic peptide     Status: None   Collection Time: 02/19/23  3:23 PM  Result Value Ref Range   B Natriuretic Peptide 17.8 0.0 - 100.0 pg/mL    Comment: Performed at [facility], [address], [place], [place] [address]  Resp panel by RT-PCR (RSV, Flu A&B, Covid) Anterior Nasal Swab     Status: None   Collection Time: 02/19/23  4:32 PM   Specimen: Anterior Nasal Swab  Result Value Ref Range   SARS Coronavirus 2 by RT PCR NEGATIVE NEGATIVE   Influenza A by PCR NEGATIVE NEGATIVE   Influenza B by PCR NEGATIVE NEGATIVE    Comment: (NOTE) The Xpert Xpress SARS-CoV-2/FLU/RSV plus assay is intended as an aid in the diagnosis of influenza from Nasopharyngeal swab specimens and should not be used as a sole basis for treatment. Nasal washings and aspirates are unacceptable for Xpert Xpress SARS-CoV-2/FLU/RSV testing.  Fact Sheet for Patients: BloggerCourse.com  Fact Sheet for Healthcare Providers: SeriousBroker.it  This test is not yet approved or cleared by the [place] FDA and has been  authorized for detection and/or diagnosis of SARS-CoV-2 by FDA under an Emergency Use Authorization (EUA). This EUA will remain in effect (meaning this test can be used) for the duration of the COVID-19 declaration under Section 564(b)(1) of the Act, 21 U.S.C. section 360bbb-3(b)(1), unless the authorization is terminated or revoked.     Resp Syncytial Virus by PCR NEGATIVE NEGATIVE    Comment: (NOTE) Fact Sheet for Patients: BloggerCourse.com  Fact Sheet for Healthcare Providers: SeriousBroker.it  This test is not yet approved or cleared by the [place] FDA and has been authorized for detection and/or diagnosis of SARS-CoV-2 by FDA under an Emergency Use Authorization (EUA). This EUA will remain in effect (meaning this test  can be used) for the duration of the COVID-19 declaration under Section 564(b)(1) of the Act, 21 U.S.C. section 360bbb-3(b)(1), unless the authorization is terminated or revoked.  Performed at [facility], [address], [place], [place] [address]    CT Angio Chest PE W and/or Wo Contrast  Result Date: 02/19/2023 CLINICAL DATA:  Shortness of breath EXAM: CT ANGIOGRAPHY CHEST WITH CONTRAST TECHNIQUE: Multidetector CT imaging of the chest was performed using the standard protocol during bolus administration of intravenous contrast. Multiplanar CT image reconstructions and MIPs were obtained to evaluate the vascular anatomy. RADIATION DOSE REDUCTION: This exam was performed according to the departmental dose-optimization program which includes automated exposure control, adjustment of the mA and/or kV according to patient size and/or use of iterative reconstruction technique. CONTRAST:  75mL OMNIPAQUE IOHEXOL 350 MG/ML SOLN COMPARISON:  Most recent, high-resolution chest CT dated March 02, 2022 FINDINGS: Cardiovascular: No evidence of pulmonary embolus. Normal heart size. No pericardial effusion. Normal  caliber thoracic aorta with mild atherosclerotic disease. Dilated main pulmonary artery, measuring up to 3.1 cm. Mediastinum/Nodes: Small hiatal hernia. Thyroid is unremarkable. Mildly enlarged bilateral hilar lymph nodes. Reference right hilar lymph node measuring 12 mm in short axis on series 5, image 69, unchanged when compared with prior and likely reactive. Lungs/Pleura: Central airways are patent. Lower lung and peribronchovascular predominant reticular and ground-glass opacities with associated traction bronchiectasis. Scattered areas of cystic change. Probable slight progression with new mild peribronchovascular reticular and ground-glass opacity seen in the upper lobes, differences in exam technique somewhat limit evaluation. Peribronchovascular no pleural effusion or pneumothorax. Upper Abdomen: Prior cholecystectomy.  No acute abnormality. Musculoskeletal: No chest wall abnormality. No acute or significant osseous findings. Review of the MIP images confirms the above findings. IMPRESSION: 1. No evidence of pulmonary embolus. 2. Fibrotic interstitial lung disease, favor fibrotic NSIP. Possible slight progression in the bilateral upper lobes when compared with the prior exam, although exam technique limits evaluation. Follow-up with ILD protocol chest CT after resolution of the acute symptoms is recommended for further evaluation. Findings are suggestive of an alternative diagnosis (not UIP) per consensus guidelines: Diagnosis of Idiopathic Pulmonary Fibrosis: An Official ATS/ERS/JRS/ALAT Clinical Practice Guideline. Am Rosezetta Schlatter Crit Care Med Vol 198, Iss 5, [phone], Jan 04 2017. 3. Dilated main pulmonary artery, findings can be seen in setting of pulmonary hypertension. 4. Aortic Atherosclerosis (ICD10-I70.0). Electronically Signed   By: Allegra Lai M.D.   On: 02/19/2023 19:02   DG Chest 2 View  Result Date: 02/19/2023 CLINICAL DATA:  Constant cough and low oxygen saturations EXAM: CHEST - 2  VIEW COMPARISON:  11/12/2021 FINDINGS: Stable cardiomediastinal silhouette. Pulmonary vascular congestion. Increased hazy bilateral airspace and interstitial opacities greatest in the lower lungs. No pleural effusion or pneumothorax. IMPRESSION: Findings suggestive of infection or edema superimposed on a background of chronic interstitial lung disease. Electronically Signed   By: Minerva Fester M.D.   On: 02/19/2023 15:04    Pending Labs Unresulted Labs (From admission, onward)     Start     Ordered   02/19/23 2039  HIV Antibody (routine testing w rflx)  (HIV Antibody (Routine testing w reflex) panel)  Once,   R        02/19/23 2045            Vitals/Pain Today's Vitals   02/19/23 1800 02/19/23 1830 02/19/23 1900 02/19/23 2043  BP: 104/80 112/84 103/89 132/84  Pulse: 86 93 92 85  Resp: (!) 30 (!) 31 (!) 27 Marland Kitchen)  24  Temp:    98.3 F (36.8 C)  TempSrc:    Oral  SpO2: 93% 93% 96% 93%  Weight:      Height:      PainSc:        Isolation Precautions No active isolations  Medications Medications  albuterol (VENTOLIN HFA) 108 (90 Base) MCG/ACT inhaler 2 puff (has no administration in time range)  enoxaparin (LOVENOX) injection 40 mg (has no administration in time range)  sodium chloride flush (NS) 0.9 % injection 3 mL (has no administration in time range)  iohexol (OMNIPAQUE) 350 MG/ML injection 75 mL (75 mLs Intravenous Contrast Given 02/19/23 1540)    Mobility walks     Focused Assessments Cardiac Assessment Handoff:  Cardiac Rhythm: Normal sinus rhythm Lab Results  Component Value Date   CKTOTAL 60 12/27/2021   CKMB 0.9 03/08/2009   TROPONINI 0.01        NO INDICATION OF MYOCARDIAL INJURY. 03/08/2009   No results found for: "DDIMER" Does the Patient currently have chest pain? No   , Pulmonary Assessment Handoff:  Lung sounds: Bilateral Breath Sounds: Clear L Breath Sounds: Clear R Breath Sounds: Clear O2 Device: Nasal Cannula 3L     R Recommendations:  See Admitting Provider Note  Report given to:   Additional Notes:

## 2023-02-19 NOTE — ED Provider Notes (Signed)
[place] EMERGENCY DEPARTMENT AT [facility] Provider Note   CSN: [id number] Arrival date & time: 02/19/23  1157     History  No chief complaint on file.   Olivia [name] is a 57 y.o. female.  HPI   57 year old female with medical history significant for pulmonary fibrosis/interstitial lung disease on O2 2 L at baseline with baseline sats 88 to 90% who presents to the emergency department with 10 out of 10 pleuritic chest pain, hypoxia at home dropping to 54% on room air, sats dropping to 74% on 2 L when ambulating.  She endorses worsening shortness of breath.  She is not on anticoagulation.  She denies any chest pressure, no radiation of her discomfort to the back, no ripping or tearing component.  She denies any lower extremity swelling or cramping pain.  She denies any fevers or chills.  Home Medications Prior to Admission medications   Medication Sig Start Date End Date Taking? Authorizing Provider  albuterol (VENTOLIN HFA) 108 (90 Base) MCG/ACT inhaler Inhale 2 puffs into the lungs every 6 (six) hours as needed for wheezing or shortness of breath. 02/09/21   Nyoka Cowden, MD  Dextromethorphan-guaiFENesin [facility] DM PO) Take by mouth in the morning and at bedtime.    [provider]  gabapentin (NEURONTIN) 300 MG capsule Take 1 capsule (300 mg total) by mouth 2 (two) times daily. 11/27/21   Nyoka Cowden, MD  Multiple Vitamins-Minerals (MULTIVITAMIN WITH MINERALS) tablet Take 1 tablet by mouth daily.    [provider]  pantoprazole (PROTONIX) 20 MG tablet Take 1 tablet (20 mg total) by mouth daily. 05/13/22   Adron Bene, MD  Pirfenidone 267 MG TABS Take 3 tablets (801 mg total) by mouth 3 (three) times daily with meals. 11/12/22   Mannam, Colbert Coyer, MD  predniSONE (DELTASONE) 10 MG tablet Take 1 tablet (10 mg total) by mouth daily with breakfast. 07/31/22   Chilton Greathouse, MD      Allergies    Miralax [polyethylene glycol]    Review  of Systems   Review of Systems  Respiratory:  Positive for shortness of breath.   Cardiovascular:  Positive for chest pain. Negative for leg swelling.  All other systems reviewed and are negative.   Physical Exam Updated Vital Signs BP 111/83 (BP Location: Left Arm)   Pulse 93   Temp 98.1 F (36.7 C) (Oral)   Resp (!) 32   Ht 4\' 11"  (1.499 m)   Wt 63 kg   LMP  (LMP Unknown)   SpO2 92%   BMI 28.07 kg/m  Physical Exam Vitals and nursing note reviewed.  Constitutional:      General: She is not in acute distress.    Appearance: She is well-developed.  HENT:     Head: Normocephalic and atraumatic.  Eyes:     Conjunctiva/sclera: Conjunctivae normal.  Cardiovascular:     Rate and Rhythm: Normal rate and regular rhythm.  Pulmonary:     Effort: Pulmonary effort is normal. No respiratory distress.     Breath sounds: Normal breath sounds.     Comments: Bibasilar crackles Abdominal:     Palpations: Abdomen is soft.     Tenderness: There is no abdominal tenderness.  Musculoskeletal:        General: No swelling.     Cervical back: Neck supple.     Right lower leg: No edema.     Left lower leg: No edema.  Skin:    General: Skin  is warm and dry.     Capillary Refill: Capillary refill takes less than 2 seconds.  Neurological:     Mental Status: She is alert.  Psychiatric:        Mood and Affect: Mood normal.     ED Results / Procedures / Treatments   Labs (all labs ordered are listed, but only abnormal results are displayed) Labs Reviewed  BASIC METABOLIC PANEL - Abnormal; Notable for the following components:      Result Value   Glucose, Bld 114 (*)    All other components within normal limits  RESP PANEL BY RT-PCR (RSV, FLU A&B, COVID)  [id number]  CBC  BRAIN NATRIURETIC PEPTIDE  TROPONIN I (HIGH SENSITIVITY)  TROPONIN I (HIGH SENSITIVITY)    EKG EKG Interpretation Date/Time:  Wednesday February 19 2023 11:53:13 EDT Ventricular Rate:  107 PR Interval:  130 QRS  Duration:  78 QT Interval:  312 QTC Calculation: 416 R Axis:   132  Text Interpretation: Sinus tachycardia Left posterior fascicular block Nonspecific T wave abnormality Abnormal ECG When compared with ECG of 07-Jul-2021 10:04, SINCE LAST TRACING HEART RATE HAS INCREASED Confirmed by Ernie Avena ([id number]) on 02/19/2023 2:25:45 PM  Radiology DG Chest 2 View  Result Date: 02/19/2023 CLINICAL DATA:  Constant cough and low oxygen saturations EXAM: CHEST - 2 VIEW COMPARISON:  11/12/2021 FINDINGS: Stable cardiomediastinal silhouette. Pulmonary vascular congestion. Increased hazy bilateral airspace and interstitial opacities greatest in the lower lungs. No pleural effusion or pneumothorax. IMPRESSION: Findings suggestive of infection or edema superimposed on a background of chronic interstitial lung disease. Electronically Signed   By: Minerva Fester M.D.   On: 02/19/2023 15:04    Procedures Procedures    Medications Ordered in ED Medications  albuterol (VENTOLIN HFA) 108 (90 Base) MCG/ACT inhaler 2 puff (has no administration in time range)  iohexol (OMNIPAQUE) 350 MG/ML injection 75 mL (75 mLs Intravenous Contrast Given 02/19/23 1540)    ED Course/ Medical Decision Making/ A&P Clinical Course as of 02/19/23 1632  Wed Feb 19, 2023  1621 Received sign out from Dr. Karene Fry presenting with increased shortness of breath. Pending PE scan and labs. With worsening hypoxia on baseline O2.  [WS]    Clinical Course User Index [WS] Lonell Grandchild, MD                                 Medical Decision Making Amount and/or Complexity of Data Reviewed Labs: ordered. Radiology: ordered.  Risk Prescription drug management.    57 year old female with medical history significant for pulmonary fibrosis/interstitial lung disease on O2 2 L at baseline with baseline sats 88 to 90% who presents to the emergency department with 10 out of 10 pleuritic chest pain, hypoxia at home dropping to 54% on room  air, sats dropping to 74% on 2 L when ambulating.  She endorses worsening shortness of breath.  She is not on anticoagulation.  She denies any chest pressure, no radiation of her discomfort to the back, no ripping or tearing component.  She denies any lower extremity swelling or cramping pain.  She denies any fevers or chills.  On arrival, the patient was afebrile, mildly tachycardic heart rate 105, hemodynamically stable, not tachypneic RR 16, saturating 96% on 2 L O2 via nasal cannula.  Physical exam revealed bibasilar crackles.  Differential diagnose includes worsening interstitial lung disease, pulmonary embolism, pneumonia, pneumothorax, viral infection.  Initially EKG revealed sinus  tachycardia, ventricular rate 107, nonspecific T wave changes, no STEMI.  A chest x-ray revealed findings suggestion of infection or edema superimposed upon a background of chronic interstitial lung disease.  Lab evaluation revealed CBC without a leukocytosis or anemia, BMP unremarkable, BNP, troponin, COVID-19 influenza PCR testing collected and pending.  CT angiogram was also ordered and pending.  Plan at time of signout to follow-up results of imaging and laboratory testing, signout given to Dr. Suezanne Jacquet plan to potentially engage pulmonology for ultimate disposition pending results of testing and reassessment.   Final Clinical Impression(s) / ED Diagnoses Final diagnoses:  SOB (shortness of breath)  ILD (interstitial lung disease) (HCC)  Acute on chronic respiratory failure with hypoxia [facility])    Rx / DC Orders ED Discharge Orders     None         Ernie Avena, MD 02/19/23 [phone]

## 2023-02-20 DIAGNOSIS — J84112 Idiopathic pulmonary fibrosis: Secondary | ICD-10-CM

## 2023-02-20 DIAGNOSIS — Z9981 Dependence on supplemental oxygen: Secondary | ICD-10-CM | POA: Diagnosis not present

## 2023-02-20 DIAGNOSIS — E440 Moderate protein-calorie malnutrition: Secondary | ICD-10-CM | POA: Diagnosis present

## 2023-02-20 DIAGNOSIS — Z91199 Patient's noncompliance with other medical treatment and regimen due to unspecified reason: Secondary | ICD-10-CM | POA: Diagnosis not present

## 2023-02-20 DIAGNOSIS — Z833 Family history of diabetes mellitus: Secondary | ICD-10-CM | POA: Diagnosis not present

## 2023-02-20 DIAGNOSIS — J8489 Other specified interstitial pulmonary diseases: Secondary | ICD-10-CM

## 2023-02-20 DIAGNOSIS — J9621 Acute and chronic respiratory failure with hypoxia: Secondary | ICD-10-CM | POA: Diagnosis present

## 2023-02-20 DIAGNOSIS — E876 Hypokalemia: Secondary | ICD-10-CM | POA: Diagnosis not present

## 2023-02-20 DIAGNOSIS — J470 Bronchiectasis with acute lower respiratory infection: Secondary | ICD-10-CM | POA: Diagnosis present

## 2023-02-20 DIAGNOSIS — I2721 Secondary pulmonary arterial hypertension: Secondary | ICD-10-CM | POA: Diagnosis present

## 2023-02-20 DIAGNOSIS — Z8249 Family history of ischemic heart disease and other diseases of the circulatory system: Secondary | ICD-10-CM | POA: Diagnosis not present

## 2023-02-20 DIAGNOSIS — Z8616 Personal history of COVID-19: Secondary | ICD-10-CM | POA: Diagnosis not present

## 2023-02-20 DIAGNOSIS — Z888 Allergy status to other drugs, medicaments and biological substances status: Secondary | ICD-10-CM | POA: Diagnosis not present

## 2023-02-20 DIAGNOSIS — Z23 Encounter for immunization: Secondary | ICD-10-CM | POA: Diagnosis not present

## 2023-02-20 DIAGNOSIS — R0602 Shortness of breath: Secondary | ICD-10-CM | POA: Diagnosis present

## 2023-02-20 DIAGNOSIS — Z1152 Encounter for screening for COVID-19: Secondary | ICD-10-CM | POA: Diagnosis not present

## 2023-02-20 DIAGNOSIS — R300 Dysuria: Secondary | ICD-10-CM | POA: Diagnosis not present

## 2023-02-20 DIAGNOSIS — Z6828 Body mass index (BMI) 28.0-28.9, adult: Secondary | ICD-10-CM | POA: Diagnosis not present

## 2023-02-20 DIAGNOSIS — Z79899 Other long term (current) drug therapy: Secondary | ICD-10-CM | POA: Diagnosis not present

## 2023-02-20 DIAGNOSIS — R64 Cachexia: Secondary | ICD-10-CM | POA: Diagnosis present

## 2023-02-20 DIAGNOSIS — J810 Acute pulmonary edema: Secondary | ICD-10-CM | POA: Diagnosis not present

## 2023-02-20 DIAGNOSIS — K219 Gastro-esophageal reflux disease without esophagitis: Secondary | ICD-10-CM | POA: Diagnosis present

## 2023-02-20 DIAGNOSIS — Z825 Family history of asthma and other chronic lower respiratory diseases: Secondary | ICD-10-CM | POA: Diagnosis not present

## 2023-02-20 LAB — RESPIRATORY PANEL BY PCR

## 2023-02-20 LAB — HIV ANTIBODY (ROUTINE TESTING W REFLEX): HIV Screen 4th Generation wRfx: NONREACTIVE

## 2023-02-20 MED ORDER — PREDNISONE 50 MG PO TABS
60.0000 mg | ORAL_TABLET | Freq: Every day | ORAL | Status: DC
  Administered 2023-02-23 – 2023-02-26 (×4): 60 mg via ORAL
  Filled 2023-02-20 (×4): qty 1

## 2023-02-20 MED ORDER — SODIUM CHLORIDE 0.9 % IV SOLN
500.0000 mg | Freq: Every day | INTRAVENOUS | Status: AC
  Administered 2023-02-20 – 2023-02-22 (×3): 500 mg via INTRAVENOUS
  Filled 2023-02-20 (×3): qty 500

## 2023-02-20 MED ORDER — BOOST / RESOURCE BREEZE PO LIQD CUSTOM
1.0000 | Freq: Two times a day (BID) | ORAL | Status: DC
  Administered 2023-02-20 – 2023-02-26 (×11): 1 via ORAL

## 2023-02-20 MED ORDER — ONDANSETRON HCL 4 MG/2ML IJ SOLN
4.0000 mg | Freq: Once | INTRAMUSCULAR | Status: AC
  Administered 2023-02-20: 4 mg via INTRAVENOUS
  Filled 2023-02-20: qty 2

## 2023-02-20 MED ORDER — ONDANSETRON 4 MG PO TBDP
4.0000 mg | ORAL_TABLET | Freq: Three times a day (TID) | ORAL | Status: DC | PRN
  Administered 2023-02-21: 4 mg via ORAL
  Filled 2023-02-20: qty 1

## 2023-02-20 MED ORDER — BENZONATATE 100 MG PO CAPS
100.0000 mg | ORAL_CAPSULE | Freq: Three times a day (TID) | ORAL | Status: DC
  Administered 2023-02-20 (×3): 100 mg via ORAL
  Filled 2023-02-20 (×3): qty 1

## 2023-02-20 NOTE — Progress Notes (Signed)
Orthostatic BPs taken:   02/20/23 1352 02/20/23 1355  Orthostatic Lying   BP- Lying 98/76  --   Pulse- Lying 102  --   Orthostatic Sitting  BP- Sitting 107/76  --   Pulse- Sitting 105  --   Orthostatic Standing at 0 minutes  BP- Standing at 0 minutes 110/76  --   Pulse- Standing at 0 minutes 110  --   Orthostatic Standing at 3 minutes  BP- Standing at 3 minutes  --  110/81  Pulse- Standing at 3 minutes  --  108

## 2023-02-20 NOTE — Progress Notes (Signed)
Initial Nutrition Assessment  DOCUMENTATION CODES:   Non-severe (moderate) malnutrition in context of chronic illness  INTERVENTION:  Discontinue Ensure Provide boost breeze BID Snack TID   NUTRITION DIAGNOSIS:   Moderate Malnutrition related to chronic illness as evidenced by mild muscle depletion, moderate fat depletion, percent weight loss, meal completion < 25%.    GOAL:   Patient will meet greater than or equal to 90% of their needs    MONITOR:   PO intake, Supplement acceptance, Labs, Weight trends  REASON FOR ASSESSMENT:   Consult Assessment of nutrition requirement/status, Calorie Count  ASSESSMENT:   57 y.o. F Admitted with acute chronic hypoxic respiratory failure. PMHX; Pulmonary fibrosis, Acute respiratory distress, GERD, Patient has had 9% weight loss x 4 months, and 10% weight loss x 7 months.  Pt with family and friends at bed side. Does not speak english well family translated for her. Stated that food lost flavor ~ 2 months ago. Had medication change around that time but quit taking about a month ago. Did not like ensure.  Stated that she does not like sweet things. Discussed meal plans for when she got home. Prefers to have snacks instead of Ensure.  Stated she would try boost. Weight loss can be expected with progression of DX.  Admit weight: 64 kg Current weight: 64 kg  Weight history:  02/19/23 63 kg  10/10/22 69 kg  07/10/22 71.3 kg  04/11/22 70.9 kg      Average Meal Intake: No current documentation.  Nutritionally Relevant Medications: Scheduled Meds:  feeding supplement  237 mL Oral BID BM    Continuous Infusions: PRN Meds:.albuterol, ondansetron, sodium chloride flush  Labs Reviewed    NUTRITION - FOCUSED PHYSICAL EXAM:  Flowsheet Row Most Recent Value  Orbital Region Moderate depletion  Upper Arm Region Moderate depletion  Thoracic and Lumbar Region Mild depletion  Buccal Region Mild depletion  Temple Region Moderate  depletion  Clavicle Bone Region Mild depletion  Clavicle and Acromion Bone Region Mild depletion  Scapular Bone Region Unable to assess  Dorsal Hand Moderate depletion  Patellar Region Mild depletion  Anterior Thigh Region Mild depletion  Posterior Calf Region Mild depletion  Edema (RD Assessment) None  Hair Reviewed  Eyes Reviewed  Mouth Reviewed  Skin Reviewed  Nails Reviewed       Diet Order:   Diet Order             Diet regular Room service appropriate? Yes; Fluid consistency: Thin  Diet effective now                   EDUCATION NEEDS:   Education needs have been addressed  Skin:  Skin Assessment: Reviewed RN Assessment  Last BM:  10/16  Height:   Ht Readings from Last 1 Encounters:  02/19/23 4\' 11"  (1.499 m)    Weight:   Wt Readings from Last 1 Encounters:  02/19/23 63 kg    Ideal Body Weight:  45 kg  BMI:  Body mass index is 28.07 kg/m.  Estimated Nutritional Needs:   Kcal:  1900-2200 kcal/d  Protein:  80-95 g/d  Fluid:  >/= 1600 ml/d    Jamelle Haring RDN, LDN Clinical Dietitian  RDN pager # available on Amion

## 2023-02-20 NOTE — Consult Note (Signed)
NAME:  Olivia [name], MRN:  [id number], DOB:  November 22, 1965, LOS: 0 ADMISSION DATE:  02/19/2023, CONSULTATION DATE: 02/20/2023 REFERRING MD: [facility] CHIEF COMPLAINT: Hypoxia  History of Present Illness:  57 year old female is followed by Dr. Dessie Coma for ILD and has been placed on pirfenidone after tapering from steroids.  She has not been taking that medication.  She presents with increasing shortness of breath, increasing cough that has proven refractory to over-the-counter cough suppressants.  Due to the fact she has ILD and she is followed by Dr. Isaiah Serge pulmonary critical care was asked to evaluate.  Pertinent  Medical History   Past Medical History:  Diagnosis Date   Acute respiratory distress 03/27/2020   Elevated blood pressure reading 12/21/2018   GERD (gastroesophageal reflux disease)    Healthcare maintenance 06/28/2019   Per patient's recent discharge summary, FOBT positive in ED. Given her overall prognosis and health condition, will hold off discussing colonoscopy.   History of COVID-19 09/02/2019   05/03/19 - [place] - positive  Did not get mab  Did not get hosp   Preventative health care 01/15/2013   Pulmonary fibrosis (HCC)    Screening breast examination 11/26/2018     Significant Hospital Events: Including procedures, antibiotic start and stop dates in addition to other pertinent events     Interim History / Subjective:  Presents with increasing cough increasing shortness of breath  Objective   Blood pressure 101/79, pulse 97, temperature 98.2 F (36.8 C), resp. rate 20, height 4\' 11"  (1.499 m), weight 63 kg, SpO2 97%.        Intake/Output Summary (Last 24 hours) at 02/20/2023 1058 Last data filed at 02/20/2023 0930 Gross per 24 hour  Intake 240 ml  Output 0 ml  Net 240 ml   Filed Weights   02/19/23 1210  Weight: 63 kg    Examination: General: Female no acute distress at rest HENT: No JVD lymphadenopathy is appreciated Lungs: Bilateral  expiratory wheezes Cardiovascular: Heart sounds are regular Abdomen: Abdomen soft nontender Extremities: Extremities warm and dry Neuro: Intact GU: Voids  Resolved Hospital Problem list     Assessment & Plan:  Increasing cough, increasing shortness of breath, she had been weaned off prednisone and was placed on pirfenidone but has not been taking it secondary to cost.  She usually uses oxygen at 2 L at night but now uses 2 to 5 L continuously.  She is followed by Dr. Isaiah Serge and pulmonary critical care asked to evaluate O2 as needed Cough suppressant as needed Agree with steroids Agree with proton pump inhibitor Pulmonary will continue to follow   Best Practice (right click and "Reselect all SmartList Selections" daily)   Diet/type: Regular consistency (see orders) DVT prophylaxis: LMWH GI prophylaxis: H2B Lines: N/A Foley:  N/A Code Status:  full code Last date of multidisciplinary goals of care discussion [tbd]  Labs   CBC: Recent Labs  Lab 02/19/23 1222  WBC 10.2  HGB 14.5  HCT 43.1  MCV 92.9  PLT 322    Basic Metabolic Panel: Recent Labs  Lab 02/19/23 1222  NA 139  K 4.1  CL 103  CO2 26  GLUCOSE 114*  BUN 7  CREATININE 0.46  CALCIUM 9.7   GFR: Estimated Creatinine Clearance: 62.7 mL/min (by C-G formula based on SCr of 0.46 mg/dL). Recent Labs  Lab 02/19/23 1222  WBC 10.2    Liver Function Tests: No results for input(s): "AST", "ALT", "ALKPHOS", "BILITOT", "PROT", "ALBUMIN" in the last 168 hours.  No results for input(s): "LIPASE", "AMYLASE" in the last 168 hours. No results for input(s): "AMMONIA" in the last 168 hours.  ABG No results found for: "PHART", "PCO2ART", "PO2ART", "HCO3", "TCO2", "ACIDBASEDEF", "O2SAT"   Coagulation Profile: No results for input(s): "INR", "PROTIME" in the last 168 hours.  Cardiac Enzymes: No results for input(s): "CKTOTAL", "CKMB", "CKMBINDEX", "TROPONINI" in the last 168 hours.  HbA1C: Hgb A1c MFr Bld   Date/Time Value Ref Range Status  07/08/2021 01:31 AM 5.2 4.8 - 5.6 % Final    Comment:    (NOTE) Pre diabetes:          5.7%-6.4%  Diabetes:              >6.4%  Glycemic control for   <7.0% adults with diabetes   03/28/2020 02:07 AM 5.4 4.8 - 5.6 % Final    Comment:    (NOTE) Pre diabetes:          5.7%-6.4%  Diabetes:              >6.4%  Glycemic control for   <7.0% adults with diabetes     CBG: No results for input(s): "GLUCAP" in the last 168 hours.  Review of Systems:   10 point review of system taken, please see HPI for positives and negatives. Positive for nonproductive cough Positive for increased shortness of breath  Past Medical History:  She,  has a past medical history of Acute respiratory distress (03/27/2020), Elevated blood pressure reading (12/21/2018), GERD (gastroesophageal reflux disease), Healthcare maintenance (06/28/2019), History of COVID-19 (09/02/2019), Preventative health care (01/15/2013), Pulmonary fibrosis (HCC), and Screening breast examination (11/26/2018).   Surgical History:   Past Surgical History:  Procedure Laterality Date   CHOLECYSTECTOMY  05/2009   COLONOSCOPY WITH PROPOFOL N/A 01/08/2022   Procedure: COLONOSCOPY WITH PROPOFOL;  Surgeon: Vida Rigger, MD;  Location: [facility];  Service: Gastroenterology;  Laterality: N/A;   ESOPHAGOGASTRODUODENOSCOPY (EGD) WITH PROPOFOL N/A 01/08/2022   Procedure: ESOPHAGOGASTRODUODENOSCOPY (EGD) WITH PROPOFOL;  Surgeon: Vida Rigger, MD;  Location: [facility];  Service: Gastroenterology;  Laterality: N/A;   POLYPECTOMY  01/08/2022   Procedure: POLYPECTOMY;  Surgeon: Vida Rigger, MD;  Location: [facility];  Service: Gastroenterology;;     Social History:   reports that she has never smoked. She has never been exposed to tobacco smoke. She has never used smokeless tobacco. She reports that she does not currently use alcohol. She reports that she does not use drugs.   Family History:  Her family  history includes Asthma in her sister; Diabetes in her father and paternal grandfather; Hypertension in her father. There is no history of Breast cancer.   Allergies Allergies  Allergen Reactions   Miralax [Polyethylene Glycol] Rash     Home Medications  Prior to Admission medications   Medication Sig Start Date End Date Taking? Authorizing Provider  gabapentin (NEURONTIN) 100 MG capsule Take 100 mg by mouth 3 (three) times daily.   Yes [provider]  omeprazole (PRILOSEC OTC) 20 MG tablet Take 20 mg by mouth daily.   Yes [provider]  Pseudoeph-Doxylamine-DM-APAP (NYQUIL PO) Take 5 mLs by mouth at bedtime.   Yes [provider]     Critical care time: Elizebeth Brooking Dabid Godown ACNP Acute Care Nurse Practitioner [facility] Pulmonary/Critical Care Please consult Amion 02/20/2023, 10:58 AM

## 2023-02-20 NOTE — Plan of Care (Signed)
  Problem: Education: Goal: Knowledge of General Education information will improve Description: Including pain rating scale, medication(s)/side effects and non-pharmacologic comfort measures Outcome: Progressing   Problem: Health Behavior/Discharge Planning: Goal: Ability to manage health-related needs will improve Outcome: Progressing   Problem: Clinical Measurements: Goal: Ability to maintain clinical measurements within normal limits will improve Outcome: Progressing Goal: Will remain free from infection Outcome: Progressing Goal: Diagnostic test results will improve Outcome: Progressing Goal: Respiratory complications will improve Outcome: Progressing Goal: Cardiovascular complication will be avoided Outcome: Progressing   Problem: Activity: Goal: Risk for activity intolerance will decrease Outcome: Progressing   Problem: Nutrition: Goal: Adequate nutrition will be maintained Outcome: Progressing   Problem: Coping: Goal: Level of anxiety will decrease Outcome: Progressing   Problem: Elimination: Goal: Will not experience complications related to bowel motility Outcome: Progressing Goal: Will not experience complications related to urinary retention Outcome: Progressing   Problem: Pain Managment: Goal: General experience of comfort will improve Outcome: Progressing   Problem: Safety: Goal: Ability to remain free from injury will improve Outcome: Progressing   Problem: Skin Integrity: Goal: Risk for impaired skin integrity will decrease Outcome: Progressing   Problem: Education: Goal: Understanding of post-operative needs will improve Outcome: Progressing Goal: Individualized Educational Video(s) Outcome: Progressing   Problem: Clinical Measurements: Goal: Postoperative complications will be avoided or minimized Outcome: Progressing   Problem: Respiratory: Goal: Will regain and/or maintain adequate ventilation Outcome: Progressing  Unable to wean O2.  Pt baseline 2LNC. Pt currently on 5LNC.

## 2023-02-20 NOTE — Progress Notes (Addendum)
HD#0 SUBJECTIVE:  Patient Summary: Olivia [name] is a 57 y.o. with a pertinent PMH of idiopathic pulmonary fibrosis presented with acute on chronic hypoxic respiratory failure after recently quitting perfenidone and prednisone admitted for further work-up.   Overnight Events: NAEO  Interim History: She is still SOB, nauseous, and coughing a lot.   OBJECTIVE:  Vital Signs: Vitals:   02/19/23 2156 02/19/23 2212 02/19/23 2244 02/20/23 0430  BP:   106/81 99/73  Pulse:   93 85  Resp:    20  Temp:   98.3 F (36.8 C) 98.1 F (36.7 C)  TempSrc:   Oral   SpO2: 93% 94% 91% 99%  Weight:      Height:       Supplemental O2: Nasal Cannula SpO2: 99 % O2 Flow Rate (L/min): 5 L/min (88-89 on 4 liters, increased to 5 liters, just gave albuterol)  Filed Weights   02/19/23 1210  Weight: 63 kg     Intake/Output Summary (Last 24 hours) at 02/20/2023 0855 Last data filed at 02/20/2023 0600 Gross per 24 hour  Intake 0 ml  Output 0 ml  Net 0 ml   Net IO Since Admission: 0 mL [02/20/23 0855]  Physical Exam: Physical Exam Constitutional:      General: She is in acute distress.     Appearance: She is ill-appearing. She is not toxic-appearing.  HENT:     Head: Normocephalic.     Mouth/Throat:     Pharynx: No pharyngeal swelling or oropharyngeal exudate.  Neck:     Vascular: No hepatojugular reflux or JVD.  Cardiovascular:     Rate and Rhythm: Regular rhythm. Tachycardia present.     Heart sounds: S1 normal and S2 normal. No murmur heard.    No friction rub. No gallop.  Pulmonary:     Effort: Tachypnea, accessory muscle usage, respiratory distress and retractions present.     Breath sounds: Examination of the right-upper field reveals wheezing. Examination of the right-middle field reveals wheezing. Examination of the left-middle field reveals wheezing. Examination of the right-lower field reveals rales. Examination of the left-lower field reveals rales. Wheezing and rales  present. No decreased breath sounds or rhonchi.  Abdominal:     Palpations: There is no hepatomegaly.     Tenderness: There is no abdominal tenderness.  Musculoskeletal:     Right lower leg: 1+ Edema present.     Left lower leg: 1+ Edema present.  Neurological:     Mental Status: She is alert.    Patient Lines/Drains/Airways Status     Active Line/Drains/Airways     Name Placement date Placement time Site Days   Peripheral IV 02/19/23 20 G 1" Anterior;Right Forearm 02/19/23  1315  Forearm  1           CT Angio Chest PE W and/or Wo Contrast  Result Date: 02/19/2023 CLINICAL DATA:  Shortness of breath EXAM: CT ANGIOGRAPHY CHEST WITH CONTRAST TECHNIQUE: Multidetector CT imaging of the chest was performed using the standard protocol during bolus administration of intravenous contrast. Multiplanar CT image reconstructions and MIPs were obtained to evaluate the vascular anatomy. RADIATION DOSE REDUCTION: This exam was performed according to the departmental dose-optimization program which includes automated exposure control, adjustment of the mA and/or kV according to patient size and/or use of iterative reconstruction technique. CONTRAST:  75mL OMNIPAQUE IOHEXOL 350 MG/ML SOLN COMPARISON:  Most recent, high-resolution chest CT dated March 02, 2022 FINDINGS: Cardiovascular: No evidence of pulmonary embolus. Normal heart size. No  pericardial effusion. Normal caliber thoracic aorta with mild atherosclerotic disease. Dilated main pulmonary artery, measuring up to 3.1 cm. Mediastinum/Nodes: Small hiatal hernia. Thyroid is unremarkable. Mildly enlarged bilateral hilar lymph nodes. Reference right hilar lymph node measuring 12 mm in short axis on series 5, image 69, unchanged when compared with prior and likely reactive. Lungs/Pleura: Central airways are patent. Lower lung and peribronchovascular predominant reticular and ground-glass opacities with associated traction bronchiectasis. Scattered areas  of cystic change. Probable slight progression with new mild peribronchovascular reticular and ground-glass opacity seen in the upper lobes, differences in exam technique somewhat limit evaluation. Peribronchovascular no pleural effusion or pneumothorax. Upper Abdomen: Prior cholecystectomy.  No acute abnormality. Musculoskeletal: No chest wall abnormality. No acute or significant osseous findings. Review of the MIP images confirms the above findings. IMPRESSION: 1. No evidence of pulmonary embolus. 2. Fibrotic interstitial lung disease, favor fibrotic NSIP. Possible slight progression in the bilateral upper lobes when compared with the prior exam, although exam technique limits evaluation. Follow-up with ILD protocol chest CT after resolution of the acute symptoms is recommended for further evaluation. Findings are suggestive of an alternative diagnosis (not UIP) per consensus guidelines: Diagnosis of Idiopathic Pulmonary Fibrosis: An Official ATS/ERS/JRS/ALAT Clinical Practice Guideline. Am Rosezetta Schlatter Crit Care Med Vol 198, Iss 5, [phone], Jan 04 2017. 3. Dilated main pulmonary artery, findings can be seen in setting of pulmonary hypertension. 4. Aortic Atherosclerosis (ICD10-I70.0). Electronically Signed   By: Allegra Lai M.D.   On: 02/19/2023 19:02   DG Chest 2 View  Result Date: 02/19/2023 CLINICAL DATA:  Constant cough and low oxygen saturations EXAM: CHEST - 2 VIEW COMPARISON:  11/12/2021 FINDINGS: Stable cardiomediastinal silhouette. Pulmonary vascular congestion. Increased hazy bilateral airspace and interstitial opacities greatest in the lower lungs. No pleural effusion or pneumothorax. IMPRESSION: Findings suggestive of infection or edema superimposed on a background of chronic interstitial lung disease. Electronically Signed   By: Minerva Fester M.D.   On: 02/19/2023 15:04     ASSESSMENT/PLAN:  Assessment: Principal Problem:   Acute on chronic hypoxic respiratory failure (HCC) Active  Problems:   NSIP (nonspecific interstitial pneumonia) (HCC)   GERD (gastroesophageal reflux disease)   Unintentional weight loss   Olivia [name] is a 57 y.o. person living with a history of IPF who presented with dyspnea and admitted for acute on chronic hypoxic respiratory failure.   Plan:   #Acute on chronic hypoxic respiratory failure #Hx Idiopathic pulmonary fibrosis #Nonspecific interstitial pneumonia #Pulmonary artery hypertension Patient on 5 L of O2 this AM. She used to use 2L at nighttime and has had a progressive need for O2 at home using it at all times. Symptoms started worsening when she stopped her pirfenidone last month due to worsening GI side effects. She used to flare on prednisone per PCCM if dose was 10mg . She is not currently on prednisone. Follows [facility]. Has not seen them since stopping her medications. CTA not concerning for PE, but pulmonary fibrosis may be worsening slightly. CXR worrisome for infection/edema in addition to lung fibrosis. However, on exam she does not appear fluid overloaded, does not have JVD, very mild edema. Could also be infectious. WBC is 10.2 did NOT have prednisone last night (started this AM). She was tachypneic yesterday and at times hypotensive. Remains afebrile. Reported chills, myalgias, fatigue, and decreased appetite. RVP is negative for covid, RSV, flu.  Upon further questioning she has had myalgias ongoing for about two weeks. The decreased in appetite and fatigue came after  she went up on the dose of perfenidine.  Plan: -PCCM consulted, will follow for recommendations. -May need follow-up with ILD protocol chest CT after resolution of the acute symptoms.  -Cw Prednisone 40mg   -Ondansetron for nausea (Qtc 416 not prolonged)   #Concern for Pulmonary artery hypertension  Has clubbing present per prior records from [facility] and dilated pulmonary artery on CTA. Has crackles on exam. Does not appear volume overloaded.  Denies any orthopnea at present. Echo deferred today.   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

## 2023-02-20 NOTE — Evaluation (Addendum)
Physical Therapy Evaluation and Discharge  Patient Details Name: Olivia [name] MRN: [id number] DOB: January 25, 1966 Today's Date: 02/20/2023  History of Present Illness  Patient is a 57 year old female with dyspnea and hypoxia with increased home oxygen requirement at rest and on exertion. History of pulmonary fibrosis.   Clinical Impression  Patient is independent at baseline, ambulates household distances without assistive device, and lives with her spouse. Today, patient is independent or modified independent (increased time) with all functional mobility tasks including walking in the room without assistive device. Sp02 decreased to 88% on 4 L02 with ambulation in the room. Education on energy conservation strategies to use in her home setting. The patient is likely at or nearing baseline level of functional independence with no anticipate PT needs at this time. PT will sign off.       If plan is discharge home, recommend the following: Assist for transportation   Can travel by private vehicle        Equipment Recommendations None recommended by PT  Recommendations for Other Services       Functional Status Assessment Patient has not had a recent decline in their functional status     Precautions / Restrictions Precautions Precautions: Other (comment) (low fall risk) Precaution Comments: Sp02 Restrictions Weight Bearing Restrictions: No      Mobility  Bed Mobility Overal bed mobility: Modified Independent                  Transfers Overall transfer level: Independent                      Ambulation/Gait Ambulation/Gait assistance: Modified independent (Device/Increase time) Gait Distance (Feet): 50 Feet Assistive device: None Gait Pattern/deviations: Step-through pattern Gait velocity: decreased     General Gait Details: increased time required, no loss of balance  Stairs            Wheelchair Mobility     Tilt Bed    Modified  Rankin (Stroke Patients Only)       Balance Overall balance assessment: No apparent balance deficits (not formally assessed)                                           Pertinent Vitals/Pain Pain Assessment Pain Assessment: No/denies pain    Home Living Family/patient expects to be discharged to:: Private residence Living Arrangements: Spouse/significant other Available Help at Discharge: Family;Available PRN/intermittently Type of Home: House Home Access: Stairs to enter   [employer] of Steps: 1-2   Home Layout: One level Home Equipment: None      Prior Function Prior Level of Function : Independent/Modified Independent             Mobility Comments: no assistive device. has been needing 2 L02 recently. limited time in the community ADLs Comments: independent but with low endurance     Extremity/Trunk Assessment   Upper Extremity Assessment Upper Extremity Assessment: Overall WFL for tasks assessed    Lower Extremity Assessment Lower Extremity Assessment: Overall WFL for tasks assessed    Cervical / Trunk Assessment Cervical / Trunk Assessment: Normal  Communication   Communication Communication: No apparent difficulties Cueing Techniques: Verbal cues  Cognition Arousal: Alert Behavior During Therapy: WFL for tasks assessed/performed Overall Cognitive Status: Within Functional Limits for tasks assessed  General Comments: Ipad interpreter used [phone] throughout session.        General Comments General comments (skin integrity, edema, etc.): Sp02 94% on 5 L02 at rest. Sp02 88% on 4 L02 with ambulation. cues for energy conservation strategies    Exercises     Assessment/Plan    PT Assessment Patient does not need any further PT services  PT Problem List         PT Treatment Interventions      PT Goals (Current goals can be found in the Care Plan section)  Acute Rehab  PT Goals PT Goal Formulation: All assessment and education complete, DC therapy    Frequency       Co-evaluation               AM-PAC PT "6 Clicks" Mobility  Outcome Measure Help needed turning from your back to your side while in a flat bed without using bedrails?: None Help needed moving from lying on your back to sitting on the side of a flat bed without using bedrails?: None Help needed moving to and from a bed to a chair (including a wheelchair)?: None Help needed standing up from a chair using your arms (e.g., wheelchair or bedside chair)?: None Help needed to walk in hospital room?: None Help needed climbing 3-5 steps with a railing? : None 6 Click Score: 24    End of Session Equipment Utilized During Treatment: Oxygen Activity Tolerance: Patient tolerated treatment well Patient left: in bed;with call bell/phone within reach Nurse Communication: Mobility status PT Visit Diagnosis: Difficulty in walking, not elsewhere classified (R26.2)    Time: 6213-0865 PT Time Calculation (min) (ACUTE ONLY): 18 min   Charges:   PT Evaluation $PT Eval Low Complexity: 1 Low   PT General Charges $$ ACUTE PT VISIT: 1 Visit         Donna Bernard, PT, MPT   [employer]a [employer] 02/20/2023, 1:32 PM

## 2023-02-20 NOTE — Evaluation (Signed)
Occupational Therapy Evaluation/Discharge Patient Details Name: Olivia [name] MRN: [id number] DOB: 13-Nov-1965 Today's Date: 02/20/2023   History of Present Illness Pt is a 57 y/o female admitted with acute on chronic hypoxic respiratory failure in setting of idiopathic pulmonary fibrosis and interstitial PNA. PMH: IPF, GERD   Clinical Impression   PTA, pt lives with spouse, typically Independent with ADLs and mobility though does endorse increasing difficulty with showering tasks. Pt presents now with primary deficit being cardiopulmonary tolerance; requiring 5 L O2 for activity rather than baseline 2 L O2 with DOE noted. Overall, pt able to manage ADLs, hallway mobility and O2 tank mgmt without assistance. Emphasis on energy conservation education (especially showering tasks) and self monitoring need for rest breaks. Pt would benefit from shower chair if able to be obtained acutely or on DC. No further skilled OT services needed but recommend continued OOB activity with mobility specialists.       If plan is discharge home, recommend the following:      Functional Status Assessment  Patient has not had a recent decline in their functional status  Equipment Recommendations  Tub/shower seat    Recommendations for Other Services       Precautions / Restrictions Precautions Precautions: Other (comment) Precaution Comments: monitor O2/DOE Restrictions Weight Bearing Restrictions: No      Mobility Bed Mobility Overal bed mobility: Modified Independent                  Transfers Overall transfer level: Independent Equipment used: None                      Balance Overall balance assessment: No apparent balance deficits (not formally assessed)                                         ADL either performed or assessed with clinical judgement   ADL Overall ADL's : Modified independent                                      Functional mobility during ADLs: Modified independent General ADL Comments: able to manage hallway mobiliity and tank without LOB or issues. extended time with energy conservation education focusing on showering tasks, self monitoring DOE     Vision Ability to See in Adequate Light: 0 Adequate Patient Visual Report: No change from baseline Vision Assessment?: No apparent visual deficits     Perception         Praxis         Pertinent Vitals/Pain Pain Assessment Pain Assessment: No/denies pain     Extremity/Trunk Assessment Upper Extremity Assessment Upper Extremity Assessment: Overall WFL for tasks assessed;Right hand dominant   Lower Extremity Assessment Lower Extremity Assessment: Defer to PT evaluation   Cervical / Trunk Assessment Cervical / Trunk Assessment: Normal   Communication Communication Communication: No apparent difficulties Cueing Techniques: Verbal cues   Cognition Arousal: Alert Behavior During Therapy: WFL for tasks assessed/performed Overall Cognitive Status: Within Functional Limits for tasks assessed                                       General Comments  SpO2 94% on 5 L O2 though 3/4  DOE noted.    Exercises     Shoulder Instructions      Home Living Family/patient expects to be discharged to:: Private residence Living Arrangements: Spouse/significant other Available Help at Discharge: Family;Available PRN/intermittently Type of Home: House Home Access: Stairs to enter [employer] of Steps: 1-2   Home Layout: One level     Bathroom Shower/Tub: Chief Strategy Officer: Standard     Home Equipment: None          Prior Functioning/Environment Prior Level of Function : Independent/Modified Independent             Mobility Comments: no AD for mobility, SOB with mobility so does not go out in the community much ADLs Comments: Able to manage ADLs without assist but does endorse  significant fatigue with showering tasks. Husband managing IADLs        OT Problem List: Cardiopulmonary status limiting activity      OT Treatment/Interventions:      OT Goals(Current goals can be found in the care plan section) Acute Rehab OT Goals Patient Stated Goal: resolve SOB, not be as tired with showering tasks OT Goal Formulation: All assessment and education complete, DC therapy  OT Frequency:      Co-evaluation              AM-PAC OT "6 Clicks" Daily Activity     Outcome Measure Help from another person eating meals?: None Help from another person taking care of personal grooming?: None Help from another person toileting, which includes using toliet, bedpan, or urinal?: None Help from another person bathing (including washing, rinsing, drying)?: None Help from another person to put on and taking off regular upper body clothing?: None Help from another person to put on and taking off regular lower body clothing?: None 6 Click Score: 24   End of Session Equipment Utilized During Treatment: Oxygen Nurse Communication: Mobility status  Activity Tolerance: Patient tolerated treatment well Patient left: in bed;with call bell/phone within reach  OT Visit Diagnosis: Other (comment) (decreased cardiopulmonary tolerance)                Time: 1660-6301 OT Time Calculation (min): 20 min Charges:  OT General Charges $OT Visit: 1 Visit OT Evaluation $OT Eval Low Complexity: 1 Low  Bradd Canary, OTR/L Acute Rehab Services Office: [phone]   Lorre Munroe 02/20/2023, 12:58 PM

## 2023-02-20 NOTE — TOC CM/SW Note (Addendum)
Transition of Care [facility]) - Inpatient Brief Assessment   Patient Details  Name: Olivia [name] MRN: [id number] Date of Birth: [birth year]  Transition of Care [facility]) CM/SW Contact:    Tom-Johnson, Hershal Coria, RN Phone Number: 02/20/2023, 3:05 PM   Clinical Narrative:  Patient presented to the ED with worsening Shortness of Breath, Cough, and increasing O2 requirements. Patient on 2L O2 [place] [facility] at home night, requiring continuous O2 for the past weeks.  Patient is followed outpatient by Pulmonologist. CTPA showed chronic Fibrotic changes, favored NSIP. Started on Steroids and PPI. Pulmonology and Registered Dietician following for Malnutrition.    CM spoke with patient at bedside with the aid of [employer] Interpreter.  From home with husband, has three children. Undocumented, not employed, no DME's at home. Receives O2 supplies from [employer].  Does not have [employer].  PCP is Faith Rogue, DO and uses [employer] at [facility].   No PT/OT f/u noted. Tub/Shower Seat recommended. CM sent referral for charity to [employer], Merry Proud accepted.   No TOC needs or recommendations noted at this time.  Patient not Medically ready for discharge.  CM will continue to follow as patient progresses with care towards discharge.            Transition of Care Asessment: Insurance and Status: Insurance coverage has been reviewed (Not insured) Patient has primary care physician: Yes Home environment has been reviewed: Yes Prior level of function:: Independent Prior/Current Home Services: No current home services Social Determinants of Health Reivew: SDOH reviewed no interventions necessary Readmission risk has been reviewed: Yes Transition of care needs: transition of care needs identified, TOC will continue to follow

## 2023-02-21 DIAGNOSIS — I2721 Secondary pulmonary arterial hypertension: Secondary | ICD-10-CM

## 2023-02-21 LAB — BASIC METABOLIC PANEL
Anion gap: 11 (ref 5–15)
BUN: 14 mg/dL (ref 6–20)
CO2: 24 mmol/L (ref 22–32)
Calcium: 9.6 mg/dL (ref 8.9–10.3)
Chloride: 101 mmol/L (ref 98–111)
Creatinine, Ser: 0.43 mg/dL — ABNORMAL LOW (ref 0.44–1.00)
GFR, Estimated: >60 mL/min (ref >60)
Glucose, Bld: 153 mg/dL — ABNORMAL HIGH (ref 70–99)
Potassium: 4 mmol/L (ref 3.5–5.1)
Sodium: 136 mmol/L (ref 135–145)

## 2023-02-21 LAB — CBC
HCT: 38.4 % (ref 36.0–46.0)
Hemoglobin: 12.9 g/dL (ref 12.0–15.0)
MCH: 29.9 pg (ref 26.0–34.0)
MCHC: 33.6 g/dL (ref 30.0–36.0)
MCV: 89.1 fL (ref 80.0–100.0)
Platelets: 309 10*3/uL (ref 150–400)
RBC: 4.31 MIL/uL (ref 3.87–5.11)
RDW: 11.8 % (ref 11.5–15.5)
WBC: 7.5 10*3/uL (ref 4.0–10.5)
nRBC: 0 % (ref 0.0–0.2)

## 2023-02-21 MED ORDER — DOCUSATE SODIUM 100 MG PO CAPS
100.0000 mg | ORAL_CAPSULE | Freq: Every day | ORAL | Status: DC
  Administered 2023-02-21 – 2023-02-27 (×7): 100 mg via ORAL
  Filled 2023-02-21 (×6): qty 1

## 2023-02-21 MED ORDER — SENNA 8.6 MG PO TABS
1.0000 | ORAL_TABLET | Freq: Every day | ORAL | Status: DC
  Administered 2023-02-21 – 2023-02-27 (×7): 8.6 mg via ORAL
  Filled 2023-02-21 (×7): qty 1

## 2023-02-21 MED ORDER — PHENOL 1.4 % MT LIQD
1.0000 | OROMUCOSAL | Status: DC | PRN
  Administered 2023-02-21: 1 via OROMUCOSAL
  Filled 2023-02-21: qty 177

## 2023-02-21 MED ORDER — BENZONATATE 100 MG PO CAPS
100.0000 mg | ORAL_CAPSULE | Freq: Three times a day (TID) | ORAL | Status: DC
  Administered 2023-02-21 – 2023-02-27 (×18): 100 mg via ORAL
  Filled 2023-02-21 (×18): qty 1

## 2023-02-21 MED ORDER — GUAIFENESIN-DM 100-10 MG/5ML PO SYRP
5.0000 mL | ORAL_SOLUTION | ORAL | Status: DC | PRN
  Administered 2023-02-21 – 2023-02-24 (×8): 5 mL via ORAL
  Filled 2023-02-21 (×8): qty 5

## 2023-02-21 NOTE — Progress Notes (Signed)
Internal Medicine Attending:  I was physically present during the critical or key portions of the resident provided service and participated in formulating the plan of care and medical decision making for the patient as documented in the resident's note.  Dickie La, MD

## 2023-02-21 NOTE — Progress Notes (Signed)
HD#1 SUBJECTIVE:  Patient Summary: Olivia [name] is a 58 y.o. with a pertinent PMH of idiopathic pulmonary fibrosis presented with acute on chronic hypoxic respiratory failure after recently quitting perfenidone and prednisone admitted for further work-up.   Overnight Events: NAEO  Interim History: She is still SOB, nauseous, and coughing a lot.   OBJECTIVE:  Vital Signs: Vitals:   02/20/23 2239 02/20/23 2241 02/21/23 0603 02/21/23 0929  BP:   109/78 117/78  Pulse:   92 (!) 104  Resp:   18 20  Temp:   98.6 F (37 C) 97.9 F (36.6 C)  TempSrc:      SpO2: 95% 95% 94% 91%  Weight:      Height:       Supplemental O2: Nasal Cannula SpO2: 91 % O2 Flow Rate (L/min): 3 L/min  Filed Weights   02/19/23 1210  Weight: 63 kg     Intake/Output Summary (Last 24 hours) at 02/21/2023 1432 Last data filed at 02/21/2023 0756 Gross per 24 hour  Intake 697.87 ml  Output 1000 ml  Net -302.13 ml   Net IO Since Admission: 56.87 mL [02/21/23 1432]  Physical Exam: Physical Exam Constitutional:      General: She is not in acute distress.    Appearance: She is not ill-appearing or toxic-appearing.  HENT:     Head: Normocephalic.     Mouth/Throat:     Pharynx: No pharyngeal swelling or oropharyngeal exudate.  Neck:     Vascular: No hepatojugular reflux or JVD.  Cardiovascular:     Rate and Rhythm: Regular rhythm. Tachycardia present.     Heart sounds: S1 normal and S2 normal. No murmur heard.    No friction rub. No gallop.  Pulmonary:     Effort: Retractions present. No tachypnea, accessory muscle usage or respiratory distress.     Breath sounds: Examination of the right-middle field reveals rales. Examination of the left-middle field reveals rales. Examination of the right-lower field reveals rales. Examination of the left-lower field reveals rales. Rales present. No decreased breath sounds, wheezing or rhonchi.  Abdominal:     Palpations: There is no hepatomegaly.      Tenderness: There is no abdominal tenderness.  Musculoskeletal:     Right lower leg: No edema.     Left lower leg: No edema.  Neurological:     Mental Status: She is alert.    Patient Lines/Drains/Airways Status     Active Line/Drains/Airways     Name Placement date Placement time Site Days   Peripheral IV 02/19/23 20 G 1" Anterior;Right Forearm 02/19/23  1315  Forearm  1            ASSESSMENT/PLAN:  Assessment: Principal Problem:   NSIP (nonspecific interstitial pneumonia) (HCC) Active Problems:   GERD (gastroesophageal reflux disease)   Acute on chronic hypoxic respiratory failure (HCC)   Unintentional weight loss   Malnutrition of moderate degree   Olivia [name] is a 57 y.o. person living with a history of IPF who presented with dyspnea and admitted for acute on chronic hypoxic respiratory failure.   Plan:   #Acute on chronic hypoxic respiratory failure #Hx Idiopathic pulmonary fibrosis #Nonspecific interstitial pneumonia #Pulmonary artery hypertension Patient on 4L of O2 this AM. She is feeling better than yesterday. Still SOB when ambulating. PCCM following. Appreciate their recs.  Plan: -cw Solumedrol 500 mg IV for 3 days and then switch to Prednisone 60 mg  -Will need OP FU with PCCM after discharge to  determine what medication she can be on  -May need follow-up with ILD protocol chest CT after resolution of the acute symptoms.  -cw Ondansetron for nausea. -cw Rubitussin DM prn for cough  #Concern for Pulmonary artery hypertension  Has clubbing present per prior records from PCCM and dilated pulmonary artery on CTA. Has crackles on exam. Does not appear volume overloaded. Denies any orthopnea at present. Echo deferred today.   #Unintentional weight loss Appreciate RD recs. Discontinue Ensure Provide boost breeze BID Snack TID   #GERD Continue home omeprazole   Best Practice: Diet: Regular diet IVF: None VTE: enoxaparin (LOVENOX) injection  40 mg Start: 02/19/23 2100 Code: Full DISPO: medical work-up  Signature: [facility]  Internal Medicine Resident, PGY-1 [facility]  Pager: [phone] 2:32 PM, 02/21/2023   Please contact the on call pager after 5 pm and on weekends at [phone].

## 2023-02-21 NOTE — Plan of Care (Signed)
CHL Tonsillectomy/Adenoidectomy, Postoperative PEDS care plan entered in error.

## 2023-02-22 LAB — RENAL FUNCTION PANEL
Albumin: 3.1 g/dL — ABNORMAL LOW (ref 3.5–5.0)
Anion gap: 10 (ref 5–15)
BUN: 19 mg/dL (ref 6–20)
CO2: 24 mmol/L (ref 22–32)
Calcium: 9.3 mg/dL (ref 8.9–10.3)
Chloride: 102 mmol/L (ref 98–111)
Creatinine, Ser: 0.55 mg/dL (ref 0.44–1.00)
GFR, Estimated: >60 mL/min (ref >60)
Glucose, Bld: 117 mg/dL — ABNORMAL HIGH (ref 70–99)
Phosphorus: 2.9 mg/dL (ref 2.5–4.6)
Potassium: 3.6 mmol/L (ref 3.5–5.1)
Sodium: 136 mmol/L (ref 135–145)

## 2023-02-22 LAB — CBC
HCT: 39.3 % (ref 36.0–46.0)
Hemoglobin: 12.9 g/dL (ref 12.0–15.0)
MCH: 29.9 pg (ref 26.0–34.0)
MCHC: 32.8 g/dL (ref 30.0–36.0)
MCV: 91 fL (ref 80.0–100.0)
Platelets: 326 10*3/uL (ref 150–400)
RBC: 4.32 MIL/uL (ref 3.87–5.11)
RDW: 11.9 % (ref 11.5–15.5)
WBC: 14.8 10*3/uL — ABNORMAL HIGH (ref 4.0–10.5)
nRBC: 0 % (ref 0.0–0.2)

## 2023-02-22 LAB — MAGNESIUM: Magnesium: 2 mg/dL (ref 1.7–2.4)

## 2023-02-22 LAB — GLUCOSE, CAPILLARY: Glucose-Capillary: 105 mg/dL — ABNORMAL HIGH (ref 70–99)

## 2023-02-22 NOTE — Plan of Care (Signed)
  Problem: Education: Goal: Knowledge of General Education information will improve Description: Including pain rating scale, medication(s)/side effects and non-pharmacologic comfort measures Outcome: Progressing   Problem: Health Behavior/Discharge Planning: Goal: Ability to manage health-related needs will improve Outcome: Progressing   Problem: Nutrition: Goal: Adequate nutrition will be maintained Outcome: Progressing   Problem: Coping: Goal: Level of anxiety will decrease Outcome: Progressing   Problem: Elimination: Goal: Will not experience complications related to urinary retention Outcome: Progressing   Problem: Pain Managment: Goal: General experience of comfort will improve Outcome: Progressing   

## 2023-02-22 NOTE — Progress Notes (Signed)
Overview: Olivia [name] is a 57 y.o. with a pertinent PMH of idiopathic pulmonary fibrosis presented with acute on chronic hypoxic respiratory failure after recently quitting perfenidone and prednisone admitted for treatment of her IPF exacerabtion.  Overnight: NAEON  Subjective:  States breathing is improved from before but still has worsening dyspnea with exertion. No other acute concerns.    Objective:   Vital signs in last 24 hours: afebrile, normotensive, sattting well on 2-3 L [place] Vitals:   02/21/23 0929 02/21/23 1724 02/21/23 2205 02/22/23 0457  BP: 117/78 115/80 135/84 108/70  Pulse: (!) 104 88 (!) 110 79  Resp: 20 18 20 14   Temp: 97.9 F (36.6 C) 98.2 F (36.8 C) 98.3 F (36.8 C) 98.3 F (36.8 C)  TempSrc:  Oral    SpO2: 91% 95% (!) 88% 93%  Weight:      Height:       Supplemental O2: Nasal Cannula Last BM Date : 02/19/23 SpO2: 93 % O2 Flow Rate (L/min): 3 L/min Filed Weights   02/19/23 1210  Weight: 63 kg    Intake/Output Summary (Last 24 hours) at 02/22/2023 0743 Last data filed at 02/21/2023 1750 Gross per 24 hour  Intake 360 ml  Output 1500 ml  Net -1140 ml   Net IO Since Admission: -383.13 mL [02/22/23 0743]  Physical Exam General: NAD, laying in bed HENT: [place] in place Lungs: fine crackles appreciated in bilateral lungs mid-low zones L>R.  Cardiovascular: RRR, good radial pulse Abdomen: no TTP, normal bowel sounds MSK: no asymmetry Skin: no lesions on skin Neuro: alert and oriented x4 Psych: normal mood and affect  Diagnostics    Latest Ref Rng & Units 02/22/2023    7:45 AM 02/21/2023    6:32 AM 02/19/2023   12:22 PM  CBC  WBC 4.0 - 10.5 K/uL 14.8  7.5  10.2   Hemoglobin 12.0 - 15.0 g/dL 02.7  25.3  66.4   Hematocrit 36.0 - 46.0 % 39.3  38.4  43.1   Platelets 150 - 400 K/uL 326  309  322        Latest Ref Rng & Units 02/22/2023    7:45 AM 02/21/2023    6:32 AM 02/19/2023   12:22 PM  BMP  Glucose 70 - 99 mg/dL 403  474   259   BUN 6 - 20 mg/dL 19  14  7    Creatinine 0.44 - 1.00 mg/dL 5.63  8.75  6.43   Sodium 135 - 145 mmol/L 136  136  139   Potassium 3.5 - 5.1 mmol/L 3.6  4.0  4.1   Chloride 98 - 111 mmol/L 102  101  103   CO2 22 - 32 mmol/L 24  24  26    Calcium 8.9 - 10.3 mg/dL 9.3  9.6  9.7     Magnesium 2.0  Assessment/Plan: Principal Problem:   NSIP (nonspecific interstitial pneumonia) (HCC) Active Problems:   GERD (gastroesophageal reflux disease)   Acute on chronic hypoxic respiratory failure (HCC)   Unintentional weight loss   Malnutrition of moderate degree  Acute on chronic hypoxic respiratory failure Hx Idiopathic pulmonary fibrosis Nonspecific interstitial pneumonia Pulmonary artery hypertension Improving. Pt is completed 3 days of 500 mg IV solumedrol and is on oral steroids with 60 mg starting tomorrow. Pulm consulted and made the previous recommendations. They plan to see her on 10/21 for further recommendations. Appreciate their assistance.    #Concern for Pulmonary artery hypertension  Noted on CT. Not on  any pharmacotherapy. May benefit from further work up given ILD to minimize stress on her lungs. Will defer to pulmonology for further work up of this.    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 RD consulted. Pt is on boost breeze BID and snacks TID. Reports good appetite.    #GERD Chronic. Continue home omeprazole  Diet: Normal IVF: None,None VTE: Enoxaparin Code: Full PT/OT recs: Pending Prior to Admission Living Arrangement: Home Anticipated Discharge Location: Home Barriers to Discharge: Medical Stability Dispo: Anticipated discharge in approximately 2 day(s).   Gwenevere Abbot, MD Eligha Bridegroom. [facility] Internal Medicine Residency, PGY-3  Pager: [phone] After 5 pm and on weekends: Please call the on-call pager

## 2023-02-23 LAB — CBC WITH DIFFERENTIAL/PLATELET
Abs Immature Granulocytes: 0.03 10*3/uL (ref 0.00–0.07)
Basophils Absolute: 0.1 10*3/uL (ref 0.0–0.1)
Basophils Relative: 1 %
Eosinophils Absolute: 0.6 10*3/uL — ABNORMAL HIGH (ref 0.0–0.5)
Eosinophils Relative: 6 %
HCT: 38.4 % (ref 36.0–46.0)
Hemoglobin: 12.7 g/dL (ref 12.0–15.0)
Immature Granulocytes: 0 %
Lymphocytes Relative: 27 %
Lymphs Abs: 2.8 10*3/uL (ref 0.7–4.0)
MCH: 30.4 pg (ref 26.0–34.0)
MCHC: 33.1 g/dL (ref 30.0–36.0)
MCV: 91.9 fL (ref 80.0–100.0)
Monocytes Absolute: 1 10*3/uL (ref 0.1–1.0)
Monocytes Relative: 10 %
Neutro Abs: 5.8 10*3/uL (ref 1.7–7.7)
Neutrophils Relative %: 56 %
Platelets: 281 10*3/uL (ref 150–400)
RBC: 4.18 MIL/uL (ref 3.87–5.11)
RDW: 12.2 % (ref 11.5–15.5)
WBC: 10.3 10*3/uL (ref 4.0–10.5)
nRBC: 0 % (ref 0.0–0.2)

## 2023-02-23 LAB — GLUCOSE, CAPILLARY: Glucose-Capillary: 145 mg/dL — ABNORMAL HIGH (ref 70–99)

## 2023-02-23 MED ORDER — PANTOPRAZOLE SODIUM 40 MG PO TBEC
40.0000 mg | DELAYED_RELEASE_TABLET | Freq: Every day | ORAL | Status: DC
  Administered 2023-02-23 – 2023-02-27 (×5): 40 mg via ORAL
  Filled 2023-02-23 (×5): qty 1

## 2023-02-23 NOTE — Plan of Care (Signed)

## 2023-02-23 NOTE — Progress Notes (Signed)
Overview: Olivia [name] is a 57 y.o. with a pertinent PMH of idiopathic pulmonary fibrosis presented with acute on chronic hypoxic respiratory failure after recently quitting perfenidone and prednisone admitted for treatment of her IPF exacerabtion.  Overnight: NAEON  Subjective:  Notes improved breathing at rest but states it worsens with ambulation and she has coughing episodes.    Objective:   Vital signs in last 24 hours: afebrile, normotensive, sattting well on 3-4 L [place] Vitals:   02/22/23 1611 02/22/23 1639 02/22/23 2024 02/23/23 0429  BP:  109/83 91/72 119/74  Pulse:  90 88 88  Resp:  18 19 18   Temp:  (!) 97.5 F (36.4 C) 99.1 F (37.3 C) 98.1 F (36.7 C)  TempSrc:  Oral Oral   SpO2: 92% 95% 96% 90%  Weight:      Height:       Supplemental O2: Nasal Cannula Last BM Date : 02/19/23 SpO2: 90 % O2 Flow Rate (L/min): 6 L/min Filed Weights   02/19/23 1210  Weight: 63 kg    Intake/Output Summary (Last 24 hours) at 02/23/2023 0615 Last data filed at 02/22/2023 1308 Gross per 24 hour  Intake 356.1 ml  Output 900 ml  Net -543.9 ml   Net IO Since Admission: -927.03 mL [02/23/23 0615]  Physical Exam  General: NAD, laying in bed HENT: [place] in place Lungs: fine crackles appreciated in bilateral lungs mid-low zones   Cardiovascular: RRR, good radial pulse Abdomen: no TTP, normal bowel sounds MSK: no asymmetry Skin: no lesions on skin Neuro: alert and oriented x4 Psych: normal mood and affect  Diagnostics    Latest Ref Rng & Units 02/23/2023    3:35 AM 02/22/2023    7:45 AM 02/21/2023    6:32 AM  CBC  WBC 4.0 - 10.5 K/uL 10.3  14.8  7.5   Hemoglobin 12.0 - 15.0 g/dL 86.5  78.4  69.6   Hematocrit 36.0 - 46.0 % 38.4  39.3  38.4   Platelets 150 - 400 K/uL 281  326  309        Latest Ref Rng & Units 02/22/2023    7:45 AM 02/21/2023    6:32 AM 02/19/2023   12:22 PM  BMP  Glucose 70 - 99 mg/dL 295  284  132   BUN 6 - 20 mg/dL 19  14  7     Creatinine 0.44 - 1.00 mg/dL 4.40  1.02  7.25   Sodium 135 - 145 mmol/L 136  136  139   Potassium 3.5 - 5.1 mmol/L 3.6  4.0  4.1   Chloride 98 - 111 mmol/L 102  101  103   CO2 22 - 32 mmol/L 24  24  26    Calcium 8.9 - 10.3 mg/dL 9.3  9.6  9.7       Assessment/Plan: Principal Problem:   NSIP (nonspecific interstitial pneumonia) (HCC) Active Problems:   GERD (gastroesophageal reflux disease)   Acute on chronic hypoxic respiratory failure (HCC)   Unintentional weight loss   Malnutrition of moderate degree  Acute on chronic hypoxic respiratory failure Hx Idiopathic pulmonary fibrosis Nonspecific interstitial pneumonia Pulmonary artery hypertension Improving. Pt is completed 3 days of 500 mg IV solumedrol and is on oral steroids with 60 mg. Pulm consulted and made the previous recommendations. They plan to see her on 10/21 for further recommendations. Appreciate their assistance. Will start flutter valve today given concern for phlegm and continue to encourage IS use.    #Concern for Pulmonary  artery hypertension  Noted on CT. Not on any pharmacotherapy. May benefit from further work up given ILD to minimize stress on her lungs. Will defer to pulmonology for further work up of this.    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 RD consulted. Pt is on boost breeze BID and snacks TID. Reports good appetite.    #GERD Chronic. Continue pantoprazole 40 mg every day.   Diet: Normal IVF: None,None VTE: Enoxaparin Code: Full PT/OT recs: Pending Prior to Admission Living Arrangement: Home Anticipated Discharge Location: Home Barriers to Discharge: Medical Stability Dispo: Anticipated discharge in approximately 2 day(s).   Gwenevere Abbot, MD Eligha Bridegroom. [facility] Internal Medicine Residency, PGY-3  Pager: [phone] After 5 pm and on weekends: Please call the on-call pager

## 2023-02-24 LAB — CBC WITH DIFFERENTIAL/PLATELET
Abs Immature Granulocytes: 0.05 10*3/uL (ref 0.00–0.07)
Basophils Absolute: 0 10*3/uL (ref 0.0–0.1)
Basophils Relative: 0 %
Eosinophils Absolute: 0.5 10*3/uL (ref 0.0–0.5)
Eosinophils Relative: 4 %
HCT: 38.6 % (ref 36.0–46.0)
Hemoglobin: 12.7 g/dL (ref 12.0–15.0)
Immature Granulocytes: 0 %
Lymphocytes Relative: 25 %
Lymphs Abs: 2.9 10*3/uL (ref 0.7–4.0)
MCH: 29.9 pg (ref 26.0–34.0)
MCHC: 32.9 g/dL (ref 30.0–36.0)
MCV: 90.8 fL (ref 80.0–100.0)
Monocytes Absolute: 1 10*3/uL (ref 0.1–1.0)
Monocytes Relative: 9 %
Neutro Abs: 7.2 10*3/uL (ref 1.7–7.7)
Neutrophils Relative %: 62 %
Platelets: 296 10*3/uL (ref 150–400)
RBC: 4.25 MIL/uL (ref 3.87–5.11)
RDW: 12 % (ref 11.5–15.5)
WBC: 11.7 10*3/uL — ABNORMAL HIGH (ref 4.0–10.5)
nRBC: 0 % (ref 0.0–0.2)

## 2023-02-24 LAB — BASIC METABOLIC PANEL
Anion gap: 10 (ref 5–15)
BUN: 18 mg/dL (ref 6–20)
CO2: 26 mmol/L (ref 22–32)
Calcium: 9.4 mg/dL (ref 8.9–10.3)
Chloride: 101 mmol/L (ref 98–111)
Creatinine, Ser: 0.46 mg/dL (ref 0.44–1.00)
GFR, Estimated: >60 mL/min (ref >60)
Glucose, Bld: 100 mg/dL — ABNORMAL HIGH (ref 70–99)
Potassium: 3.4 mmol/L — ABNORMAL LOW (ref 3.5–5.1)
Sodium: 137 mmol/L (ref 135–145)

## 2023-02-24 LAB — GLUCOSE, CAPILLARY
Glucose-Capillary: 97 mg/dL (ref 70–99)
Glucose-Capillary: 131 mg/dL — ABNORMAL HIGH (ref 70–99)
Glucose-Capillary: 231 mg/dL — ABNORMAL HIGH (ref 70–99)
Glucose-Capillary: 144 mg/dL — ABNORMAL HIGH (ref 70–99)

## 2023-02-24 MED ORDER — POTASSIUM CHLORIDE CRYS ER 20 MEQ PO TBCR
40.0000 meq | EXTENDED_RELEASE_TABLET | Freq: Once | ORAL | Status: AC
  Administered 2023-02-24: 40 meq via ORAL
  Filled 2023-02-24: qty 2

## 2023-02-24 NOTE — TOC Progression Note (Signed)
Transition of Care [facility]) - Progression Note    Patient Details  Name: Olivia [name] MRN: [id number] Date of Birth: [birth year]  Transition of Care [facility]) CM/SW Contact  Tom-Johnson, Hershal Coria, RN Phone Number: 02/24/2023, 1:38 PM  Clinical Narrative:     Patient on 5L O2, baseline is 2L. On Prednisone taper.  Patient not Medically ready for discharge.  CM will continue to follow as patient progresses with care towards discharge.      Expected Discharge Plan and Services                                               Social Determinants of Health (SDOH) Interventions SDOH Screenings   Food Insecurity: No Food Insecurity (02/19/2023)  Housing: Low Risk  (02/19/2023)  Transportation Needs: No Transportation Needs (02/19/2023)  Utilities: Not At Risk (02/19/2023)  Depression (PHQ2-9): Low Risk  (11/12/2021)  Financial Resource Strain: High Risk (07/08/2021)  Tobacco Use: Low Risk  (02/19/2023)    Readmission Risk Interventions    02/20/2023    3:04 PM  Readmission Risk Prevention Plan  Post Dischage Appt Complete  Medication Screening Complete  Transportation Screening Complete

## 2023-02-24 NOTE — Progress Notes (Signed)
MEWS Progress Note  Patient Details Name: Olivia [name] MRN: [id number] DOB: 1965/06/20 Today's Date: 02/24/2023   MEWS Flowsheet Documentation:  Assess: MEWS Score Temp: 98.2 F (36.8 C) BP: 104/77 MAP (mmHg): 85 Pulse Rate: 87 ECG Heart Rate: 84 Resp: 16 Level of Consciousness: Alert SpO2: 97 % O2 Device: Nasal Cannula Patient Activity (if Appropriate): In bed O2 Flow Rate (L/min): 5 L/min Assess: MEWS Score MEWS Temp: 0 MEWS Systolic: 0 MEWS Pulse: 0 MEWS RR: 0 MEWS LOC: 0 MEWS Score: 0 MEWS Score Color: Green Assess: SIRS CRITERIA SIRS Temperature : 0 SIRS Respirations : 0 SIRS Pulse: 0 SIRS WBC: 0 SIRS Score Sum : 0          Santiago Bumpers 02/24/2023, 12:03 PM

## 2023-02-24 NOTE — Progress Notes (Signed)
Went to check on pt needs, by Orlan Leavens [employer] Medical Interpreter.

## 2023-02-24 NOTE — Progress Notes (Signed)
NAME:  Olivia [name], MRN:  [id number], DOB:  10/21/1965, LOS: 4 ADMISSION DATE:  02/19/2023, CONSULTATION DATE: 02/20/2023 REFERRING MD: Triad CHIEF COMPLAINT: Hypoxia  History of Present Illness:  57 year old female is followed by Dr. Dessie Coma for ILD and has been placed on pirfenidone after tapering from steroids.  She has not been taking that medication due to cost.  She presents with increasing shortness of breath, increasing cough that has proven refractory to over-the-counter cough suppressants.  Due to the fact she has ILD( NOted on  imaging since 2020 and she is followed by Dr. Isaiah Serge .Pulmonary critical care was asked to evaluate.  Pertinent  Medical History   Past Medical History:  Diagnosis Date   Acute respiratory distress 03/27/2020   Elevated blood pressure reading 12/21/2018   GERD (gastroesophageal reflux disease)    Healthcare maintenance 06/28/2019   Per patient's recent discharge summary, FOBT positive in ED. Given her overall prognosis and health condition, will hold off discussing colonoscopy.   History of COVID-19 09/02/2019   05/03/19 - Sarscov2 - positive  Did not get mab  Did not get hosp   Preventative health care 01/15/2013   Pulmonary fibrosis (HCC)    Screening breast examination 11/26/2018     Significant Hospital Events: Including procedures, antibiotic start and stop dates in addition to other pertinent events   Admission 02/20/2023 Solumedrol 10/17-10/20 Prednisone since 10/20, on 5L [place] with sats of 97%, RR 16  Interim History / Subjective:  Desaturates easity with conversation., and with cough jags.  States her breathing is better, cough is better.  Objective   Blood pressure 104/77, pulse 87, temperature 98.2 F (36.8 C), resp. rate 16, height 4\' 11"  (1.499 m), weight 63 kg, SpO2 97%.        Intake/Output Summary (Last 24 hours) at 02/24/2023 0953 Last data filed at 02/24/2023 0719 Gross per 24 hour  Intake 360 ml  Output 0  ml  Net 360 ml   Filed Weights   02/19/23 1210  Weight: 63 kg    Examination: General: Female , in bed, on 6 L [place],  HENT: No JVD lymphadenopathy , MM pink and moist, PERRLA Lungs: Bilateral chest excursion, Bilateral coarse breath sounds, crackles worse on left than right  Cardiovascular: S1, S2, RRR No RMG Abdomen: Abdomen soft nontender, ND, BS + Extremities: Extremities warm and dry,  No obvious deformities Neuro: , MAE x 4, A&O x 3 GU: Voids  Labs reviewed 10/21 Na 137/ K 3.4/ Cl 101/CO2 26/ Glucose 100/ BUN 18/ Creatinine 0.46/ Calcium 9.4  Resolved Hospital Problem list     Assessment & Plan:  Increasing cough, increasing shortness of breath, she had been weaned off prednisone and was placed on pirfenidone but has not been taking it secondary to cost.  She usually uses oxygen at 2 L at night but now uses 2 to 6 L continuously.  She is followed by Dr. Isaiah Serge and pulmonary critical care asked to evaluate Titrate O2 as needed to maintain sats > 90% Cough suppressant as needed Agree with steroids, continue 60 mg x 4 days before considering decrease.  Agree with proton pump inhibitor Check Mag, replete if less than 2 Replete electrolytes as needed  Will order Echo as CTA shows dilated PA to better evaluate for PAH as possible secondary cause for hypoxemia Pulmonary will continue to follow Pt will need OP follow up for financial assistance for her pirfenidone, as she has had some progression of disease , and  needs maintenance antifibrotic.     Best Practice (right click and "Reselect all SmartList Selections" daily)   Diet/type: Regular consistency (see orders) DVT prophylaxis: LMWH GI prophylaxis: H2B Lines: N/A Foley:  N/A Code Status:  full code Last date of multidisciplinary goals of care discussion [tbd]  Labs   CBC: Recent Labs  Lab 02/19/23 1222 02/21/23 0632 02/22/23 0745 02/23/23 0335 02/24/23 0534  WBC 10.2 7.5 14.8* 10.3 11.7*  NEUTROABS  --   --    --  5.8 7.2  HGB 14.5 12.9 12.9 12.7 12.7  HCT 43.1 38.4 39.3 38.4 38.6  MCV 92.9 89.1 91.0 91.9 90.8  PLT 322 309 326 281 296    Basic Metabolic Panel: Recent Labs  Lab 02/19/23 1222 02/21/23 0632 02/22/23 0745 02/24/23 0534  NA 139 136 136 137  K 4.1 4.0 3.6 3.4*  CL 103 101 102 101  CO2 26 24 24 26   GLUCOSE 114* 153* 117* 100*  BUN 7 14 19 18   CREATININE 0.46 0.43* 0.55 0.46  CALCIUM 9.7 9.6 9.3 9.4  MG  --   --  2.0  --   PHOS  --   --  2.9  --    GFR: Estimated Creatinine Clearance: 62.7 mL/min (by C-G formula based on SCr of 0.46 mg/dL). Recent Labs  Lab 02/21/23 0632 02/22/23 0745 02/23/23 0335 02/24/23 0534  WBC 7.5 14.8* 10.3 11.7*    Liver Function Tests: Recent Labs  Lab 02/22/23 0745  ALBUMIN 3.1*   No results for input(s): "LIPASE", "AMYLASE" in the last 168 hours. No results for input(s): "AMMONIA" in the last 168 hours.  ABG No results found for: "PHART", "PCO2ART", "PO2ART", "HCO3", "TCO2", "ACIDBASEDEF", "O2SAT"   Coagulation Profile: No results for input(s): "INR", "PROTIME" in the last 168 hours.  Cardiac Enzymes: No results for input(s): "CKTOTAL", "CKMB", "CKMBINDEX", "TROPONINI" in the last 168 hours.  HbA1C: Hgb A1c MFr Bld  Date/Time Value Ref Range Status  07/08/2021 01:31 AM 5.2 4.8 - 5.6 % Final    Comment:    (NOTE) Pre diabetes:          5.7%-6.4%  Diabetes:              >6.4%  Glycemic control for   <7.0% adults with diabetes   03/28/2020 02:07 AM 5.4 4.8 - 5.6 % Final    Comment:    (NOTE) Pre diabetes:          5.7%-6.4%  Diabetes:              >6.4%  Glycemic control for   <7.0% adults with diabetes     CBG: Recent Labs  Lab 02/22/23 0720 02/23/23 2020 02/24/23 0755  GLUCAP 105* 145* 97   Allergies Allergies  Allergen Reactions   Miralax [Polyethylene Glycol] Rash     Home Medications  Prior to Admission medications   Medication Sig Start Date End Date Taking? Authorizing Provider   gabapentin (NEURONTIN) 100 MG capsule Take 100 mg by mouth 3 (three) times daily.   Yes [provider]  omeprazole (PRILOSEC OTC) 20 MG tablet Take 20 mg by mouth daily.   Yes [provider]  Pseudoeph-Doxylamine-DM-APAP (NYQUIL PO) Take 5 mLs by mouth at bedtime.   Yes [provider]     Pulmonary NP Time : 30 minutes    Bevelyn Ngo, MSN, AGACNP-BC [facility] Pulmonary/Critical Care Medicine See Amion for personal pager PCCM on call pager [phone]  02/24/2023, 9:53 AM

## 2023-02-24 NOTE — Progress Notes (Addendum)
Overview: Olivia [name] is a 57 y.o. with a pertinent PMH of idiopathic pulmonary fibrosis presented with acute on chronic hypoxic respiratory failure after recently quitting perfenidone and prednisone admitted for treatment of her IPF exacerabtion.  Overnight: NAEON  Subjective:  Still on O2. Her baseline was 2-3L at night before her dyspnea worsened last month. She also states that she had side effects from perfenidone, thus recently stopping it. She still has this medication at home. Does have transportation to go to follow up appts.   Objective:   Vital signs in last 24 hours: afebrile, normotensive, sattting well on 3-4 L [place] Vitals:   02/23/23 1625 02/23/23 2015 02/24/23 0610 02/24/23 0753  BP: 112/69 98/75 100/65 104/77  Pulse: 91 91 76 87  Resp: 18 19 18 16   Temp: 98.2 F (36.8 C) 98.2 F (36.8 C) 98.3 F (36.8 C) 98.2 F (36.8 C)  TempSrc: Oral     SpO2: 95% 94% 95% 97%  Weight:      Height:       Supplemental O2: Nasal Cannula Last BM Date : 02/19/23 SpO2: 97 % O2 Flow Rate (L/min): 5 L/min Filed Weights   02/19/23 1210  Weight: 63 kg    Intake/Output Summary (Last 24 hours) at 02/24/2023 1330 Last data filed at 02/24/2023 0900 Gross per 24 hour  Intake 580 ml  Output 0 ml  Net 580 ml   Net IO Since Admission: -107.03 mL [02/24/23 1330]  Physical Exam  General: NAD, laying in bed HENT: [place] in place Lungs: Coarse crackles appreciated in bilateral lungs mid-low zones   Cardiovascular: RRR, good radial pulse Abdomen: no TTP, normal bowel sounds MSK: no asymmetry Nails: Clubbing present Neuro: alert and oriented x4 Psych: normal mood and affect  Diagnostics    Latest Ref Rng & Units 02/24/2023    5:34 AM 02/23/2023    3:35 AM 02/22/2023    7:45 AM  CBC  WBC 4.0 - 10.5 K/uL 11.7  10.3  14.8   Hemoglobin 12.0 - 15.0 g/dL 16.1  09.6  04.5   Hematocrit 36.0 - 46.0 % 38.6  38.4  39.3   Platelets 150 - 400 K/uL 296  281  326        Latest  Ref Rng & Units 02/24/2023    5:34 AM 02/22/2023    7:45 AM 02/21/2023    6:32 AM  BMP  Glucose 70 - 99 mg/dL 409  811  914   BUN 6 - 20 mg/dL 18  19  14    Creatinine 0.44 - 1.00 mg/dL 7.82  9.56  2.13   Sodium 135 - 145 mmol/L 137  136  136   Potassium 3.5 - 5.1 mmol/L 3.4  3.6  4.0   Chloride 98 - 111 mmol/L 101  102  101   CO2 22 - 32 mmol/L 26  24  24    Calcium 8.9 - 10.3 mg/dL 9.4  9.3  9.6       Assessment/Plan: Principal Problem:   NSIP (nonspecific interstitial pneumonia) (HCC) Active Problems:   GERD (gastroesophageal reflux disease)   Acute on chronic hypoxic respiratory failure (HCC)   Unintentional weight loss   Malnutrition of moderate degree  Acute on chronic hypoxic respiratory failure Hx Idiopathic pulmonary fibrosis Nonspecific interstitial pneumonia Pulmonary artery hypertension Improving. Pt is completed 3 days of 500 mg IV solumedrol and is on oral steroids with 60 mg (day 2/4). Her baseline was 2-3L of O2 at night before her  dyspnea worsened last month. She also states that she had side effects from pirfenidone (GI upset, weakness), thus recently stopping it. She still has this medication at home and will be able to follow with [facility]. Appreciate Pulm's recommendations  -cw O2 sat >90%  -cough suppressants PRN -cw flutter valve and IS use.   #Concern for Pulmonary artery hypertension  Noted on CT. Echo ordered.    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 RD consulted. Pt is on boost breeze BID and snacks TID. Reports good appetite.    #GERD Chronic. Continue pantoprazole 40 mg every day.   Diet: Normal IVF: None,None VTE: Enoxaparin Code: Full PT/OT recs: Pending Prior to Admission Living Arrangement: Home Anticipated Discharge Location: Home Barriers to Discharge: Medical Stability Dispo: Anticipated discharge in approximately 2 day(s).   [name], MD Eligha Bridegroom. [facility] Internal  Medicine Residency, PGY-1  Pager: [phone] After 5 pm and on weekends: Please call the on-call pager

## 2023-02-25 ENCOUNTER — Inpatient Hospital Stay (HOSPITAL_COMMUNITY): Payer: MEDICAID

## 2023-02-25 DIAGNOSIS — I272 Pulmonary hypertension, unspecified: Secondary | ICD-10-CM

## 2023-02-25 LAB — CBC
HCT: 37.2 % (ref 36.0–46.0)
Hemoglobin: 12.6 g/dL (ref 12.0–15.0)
MCH: 30.4 pg (ref 26.0–34.0)
MCHC: 33.9 g/dL (ref 30.0–36.0)
MCV: 89.6 fL (ref 80.0–100.0)
Platelets: 300 10*3/uL (ref 150–400)
RBC: 4.15 MIL/uL (ref 3.87–5.11)
RDW: 11.9 % (ref 11.5–15.5)
WBC: 10.1 10*3/uL (ref 4.0–10.5)
nRBC: 0 % (ref 0.0–0.2)

## 2023-02-25 LAB — ECHOCARDIOGRAM COMPLETE
AR max vel: 2.13 cm2
AV Area VTI: 2.1 cm2
AV Area mean vel: 1.94 cm2
AV Mean grad: 4 mm[Hg]
AV Peak grad: 7.1 mm[Hg]
Ao pk vel: 1.34 m/s
Area-P 1/2: 3.91 cm2
Height: 59 in
S' Lateral: 2.1 cm
Weight: 2224 [oz_av]

## 2023-02-25 LAB — RENAL FUNCTION PANEL
Albumin: 2.8 g/dL — ABNORMAL LOW (ref 3.5–5.0)
Anion gap: 13 (ref 5–15)
BUN: 14 mg/dL (ref 6–20)
CO2: 25 mmol/L (ref 22–32)
Calcium: 9.7 mg/dL (ref 8.9–10.3)
Chloride: 100 mmol/L (ref 98–111)
Creatinine, Ser: 0.51 mg/dL (ref 0.44–1.00)
GFR, Estimated: >60 mL/min (ref >60)
Glucose, Bld: 102 mg/dL — ABNORMAL HIGH (ref 70–99)
Phosphorus: 3.7 mg/dL (ref 2.5–4.6)
Potassium: 3.9 mmol/L (ref 3.5–5.1)
Sodium: 138 mmol/L (ref 135–145)

## 2023-02-25 MED ORDER — FUROSEMIDE 10 MG/ML IJ SOLN
40.0000 mg | Freq: Two times a day (BID) | INTRAMUSCULAR | Status: DC
  Administered 2023-02-25 – 2023-02-27 (×4): 40 mg via INTRAVENOUS
  Filled 2023-02-25 (×4): qty 4

## 2023-02-25 NOTE — Progress Notes (Signed)
SATURATION QUALIFICATIONS: (This note is used to comply with regulatory documentation for home oxygen)  Patient Saturations on Room Air at Rest = Not assessed, pt on 4L upon PT arrival with sats at 88%.  Patient Saturations on Room Air while Ambulating = Not assessed due to need for supplemental oxygen at baseline.  Patient Saturations on 6 Liters of oxygen while Ambulating = 92%  Please briefly explain why patient needs home oxygen: Pt sats at 88% with attempts at ambulation on 4L [place]. Pt benefits from supplemental oxygen to improve oxygen saturation and to maintain SpO2.

## 2023-02-25 NOTE — Progress Notes (Signed)
*  PRELIMINARY RESULTS* Echocardiogram 2D Echocardiogram has been performed.  Olivia [name] 02/25/2023, 9:45 AM

## 2023-02-25 NOTE — Progress Notes (Addendum)
Overview: Olivia [name] is a 57 y.o. with a pertinent PMH of idiopathic pulmonary fibrosis presented with acute on chronic hypoxic respiratory failure after recently quitting perfenidone and prednisone admitted for treatment of her IPF exacerabtion.  Overnight: NAEON  Subjective:  Last night was the first night she was able to sleep consecutively. Was able to ambulate to the restroom and come back.   Objective:   Vital signs in last 24 hours:  Vitals:   02/24/23 2036 02/25/23 0544 02/25/23 0743 02/25/23 0900  BP: 104/78 100/66 96/65   Pulse: 94 82 77   Resp: 20 20 18    Temp:  98.1 F (36.7 C) 97.8 F (36.6 C)   TempSrc:   Oral   SpO2: 96% 96% 98% 90%  Weight:      Height:       Supplemental O2: Nasal Cannula Last BM Date : 02/23/23 SpO2: 90 % O2 Flow Rate (L/min): 5 L/min Filed Weights   02/19/23 1210  Weight: 63 kg    Intake/Output Summary (Last 24 hours) at 02/25/2023 1103 Last data filed at 02/25/2023 0900 Gross per 24 hour  Intake 220 ml  Output --  Net 220 ml   Net IO Since Admission: 112.97 mL [02/25/23 1103]  Physical Exam  General: NAD, sitting in bed, coughing HENT: [place] in place, cyanotic lips Lungs: Mild crackles on auscultation today Cardiovascular: tachycardic, no mrg Abdomen: no TTP MSK: no asymmetry Nails: Clubbing present Neuro: alert and oriented x4 Psych: normal mood and affect  Diagnostics    Latest Ref Rng & Units 02/25/2023    4:07 AM 02/24/2023    5:34 AM 02/23/2023    3:35 AM  CBC  WBC 4.0 - 10.5 K/uL 10.1  11.7  10.3   Hemoglobin 12.0 - 15.0 g/dL 09.8  11.9  14.7   Hematocrit 36.0 - 46.0 % 37.2  38.6  38.4   Platelets 150 - 400 K/uL 300  296  281        Latest Ref Rng & Units 02/25/2023    4:07 AM 02/24/2023    5:34 AM 02/22/2023    7:45 AM  BMP  Glucose 70 - 99 mg/dL 829  562  130   BUN 6 - 20 mg/dL 14  18  19    Creatinine 0.44 - 1.00 mg/dL 8.65  7.84  6.96   Sodium 135 - 145 mmol/L 138  137  136    Potassium 3.5 - 5.1 mmol/L 3.9  3.4  3.6   Chloride 98 - 111 mmol/L 100  101  102   CO2 22 - 32 mmol/L 25  26  24    Calcium 8.9 - 10.3 mg/dL 9.7  9.4  9.3       Assessment/Plan: Principal Problem:   NSIP (nonspecific interstitial pneumonia) (HCC) Active Problems:   GERD (gastroesophageal reflux disease)   Acute on chronic hypoxic respiratory failure (HCC)   Unintentional weight loss   Malnutrition of moderate degree  Acute on chronic hypoxic respiratory failure Hx Idiopathic pulmonary fibrosis Nonspecific interstitial pneumonia Pulmonary artery hypertension Pt is completed 3 days of 500 mg IV solumedrol and is on oral steroids with 60 mg (day 3/4).  Was on pirfenidone but unable to tolerate side effects, so she discontinued this medication. Appreciate Pulm's recommendations. Subjectively improved, but pt was on 6L [place] last night. Her baseline was 2-3L of O2 at night before her dyspnea worsened last month. PT/OT to encourage ambulation and see if she needs even higher O2  with activity. -cw Prednisone 60mg  today (day 3/4) -cw O2 sat GOAL 88-92% -cough suppressants PRN -cw flutter valve and IS use.  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 RD consulted. Pt is on boost breeze BID and snacks TID. Reports good appetite.    #GERD Chronic. Continue pantoprazole 40 mg every day.   Diet: Normal IVF: None,None VTE: Enoxaparin Code: Full PT/OT recs: Pending Prior to Admission Living Arrangement: Home Anticipated Discharge Location: Home Barriers to Discharge: Medical Stability Dispo: Anticipated discharge in approximately 2 day(s).   [name], MD Eligha Bridegroom. [facility] Internal Medicine Residency, PGY-1  Pager: [phone] After 5 pm and on weekends: Please call the on-call pager

## 2023-02-25 NOTE — Progress Notes (Signed)
NAME:  Olivia [name], MRN:  [id number], DOB:  [birth year], LOS: 5 ADMISSION DATE:  02/19/2023, CONSULTATION DATE: 02/20/2023 REFERRING MD: [facility]ad CHIEF COMPLAINT: Hypoxia  History of Present Illness:  57 year old female is followed by Dr. Dessie Coma for ILD and has been placed on pirfenidone after tapering from steroids.  She has not been taking that medication due to cost.  She presents with increasing shortness of breath, increasing cough that has proven refractory to over-the-counter cough suppressants.  Due to the fact she has ILD( NOted on  imaging since 2020 and she is followed by Dr. Isaiah Serge .Pulmonary critical care was asked to evaluate.  Pertinent  Medical History   Past Medical History:  Diagnosis Date   Acute respiratory distress 03/27/2020   Elevated blood pressure reading 12/21/2018   GERD (gastroesophageal reflux disease)    Healthcare maintenance 06/28/2019   Per patient's recent discharge summary, FOBT positive in ED. Given her overall prognosis and health condition, will hold off discussing colonoscopy.   History of COVID-19 09/02/2019   05/03/19 - Sarscov2 - positive  Did not get mab  Did not get hosp   Preventative health care 01/15/2013   Pulmonary fibrosis (HCC)    Screening breast examination 11/26/2018     Significant Hospital Events: Including procedures, antibiotic start and stop dates in addition to other pertinent events   Admission 02/20/2023 Solumedrol 10/17-10/20 Prednisone since 10/20, on 5L [place] with sats of 97%, RR 16  Interim History / Subjective:  Reports being better yet still desaturates with activity  Objective   Blood pressure 96/65, pulse 77, temperature 97.8 F (36.6 C), temperature source Oral, resp. rate 18, height 4\' 11"  (1.499 m), weight 63 kg, SpO2 90%.        Intake/Output Summary (Last 24 hours) at 02/25/2023 1141 Last data filed at 02/25/2023 0900 Gross per 24 hour  Intake 220 ml  Output --  Net 220 ml   Filed Weights    02/19/23 1210  Weight: 63 kg    Examination: Reports feeling better able to ambulate to the bathroom No JVD lymphadenopathy is appreciated Diminished breath sounds throughout Sounds are regular regular rate and rhythm Abdomen soft nontender positive bowel sounds Extremities are warm without edema   Resolved Hospital Problem list     Assessment & Plan:  Increasing cough, increasing shortness of breath, she had been weaned off prednisone and was placed on pirfenidone but has not been taking it secondary to cost.  She usually uses oxygen at 2 L at night but now uses 2 to 6 L continuously.  She is followed by Dr. Isaiah Serge and pulmonary critical care asked to evaluate  Remains on 6 L nasal cannula and desaturates with ambulation She reports feeling better after being on high-dose steroids Continue 60 mg for 3 more days before decreasing 2D echo has been ordered not seen results of yet Outpatient follow-up with Dr. Isaiah Serge Needs maintenance with antifibrotic      Best Practice (right click and "Reselect all SmartList Selections" daily)   Diet/type: Regular consistency (see orders) DVT prophylaxis: LMWH GI prophylaxis: H2B Lines: N/A Foley:  N/A Code Status:  full code Last date of multidisciplinary goals of care discussion [tbd]  Labs   CBC: Recent Labs  Lab 02/21/23 0632 02/22/23 0745 02/23/23 0335 02/24/23 0534 02/25/23 0407  WBC 7.5 14.8* 10.3 11.7* 10.1  NEUTROABS  --   --  5.8 7.2  --   HGB 12.9 12.9 12.7 12.7 12.6  HCT 38.4 39.3  38.4 38.6 37.2  MCV 89.1 91.0 91.9 90.8 89.6  PLT 309 326 281 296 300    Basic Metabolic Panel: Recent Labs  Lab 02/19/23 1222 02/21/23 0632 02/22/23 0745 02/24/23 0534 02/25/23 0407  NA 139 136 136 137 138  K 4.1 4.0 3.6 3.4* 3.9  CL 103 101 102 101 100  CO2 26 24 24 26 25   GLUCOSE 114* 153* 117* 100* 102*  BUN 7 14 19 18 14   CREATININE 0.46 0.43* 0.55 0.46 0.51  CALCIUM 9.7 9.6 9.3 9.4 9.7  MG  --   --  2.0  --   --    PHOS  --   --  2.9  --  3.7   GFR: Estimated Creatinine Clearance: 62.7 mL/min (by C-G formula based on SCr of 0.51 mg/dL). Recent Labs  Lab 02/22/23 0745 02/23/23 0335 02/24/23 0534 02/25/23 0407  WBC 14.8* 10.3 11.7* 10.1    Liver Function Tests: Recent Labs  Lab 02/22/23 0745 02/25/23 0407  ALBUMIN 3.1* 2.8*   No results for input(s): "LIPASE", "AMYLASE" in the last 168 hours. No results for input(s): "AMMONIA" in the last 168 hours.  ABG No results found for: "PHART", "PCO2ART", "PO2ART", "HCO3", "TCO2", "ACIDBASEDEF", "O2SAT"   Coagulation Profile: No results for input(s): "INR", "PROTIME" in the last 168 hours.  Cardiac Enzymes: No results for input(s): "CKTOTAL", "CKMB", "CKMBINDEX", "TROPONINI" in the last 168 hours.  HbA1C: Hgb A1c MFr Bld  Date/Time Value Ref Range Status  07/08/2021 01:31 AM 5.2 4.8 - 5.6 % Final    Comment:    (NOTE) Pre diabetes:          5.7%-6.4%  Diabetes:              >6.4%  Glycemic control for   <7.0% adults with diabetes   03/28/2020 02:07 AM 5.4 4.8 - 5.6 % Final    Comment:    (NOTE) Pre diabetes:          5.7%-6.4%  Diabetes:              >6.4%  Glycemic control for   <7.0% adults with diabetes     CBG: Recent Labs  Lab 02/23/23 2020 02/24/23 0755 02/24/23 1124 02/24/23 1622 02/24/23 2146  GLUCAP 145* 97 131* 231* 144*    Steve Tonatiuh Mallon ACNP Acute Care Nurse Practitioner [facility] Pulmonary/Critical Care Please consult Amion 02/25/2023, 11:41 AM

## 2023-02-25 NOTE — Evaluation (Signed)
Physical Therapy Evaluation Patient Details Name: Olivia [name] MRN: [id number] DOB: 1965-09-30 Today's Date: 02/25/2023  History of Present Illness  Pt is a 57 y/o female admitted with acute on chronic hypoxic respiratory failure in setting of idiopathic pulmonary fibrosis and interstitial PNA. PMH: IPF, GERD.  Clinical Impression  Pt presents to PT with deficits in cardiopulmonary function and endurance. Pt is able to ambulate for household distances independently. Pt is requiring increased supplemental oxygen compared to baseline. PT encourages frequent mobilization and use of incentive spirometer in an effort to improve pulmonary function. PT will continue to follow during admission.      If plan is discharge home, recommend the following: Assistance with cooking/housework;Assist for transportation   Can travel by private vehicle        Equipment Recommendations None recommended by PT  Recommendations for Other Services       Functional Status Assessment Patient has had a recent decline in their functional status and demonstrates the ability to make significant improvements in function in a reasonable and predictable amount of time.     Precautions / Restrictions Precautions Precautions: Other (comment) Precaution Comments: monitor sats Restrictions Weight Bearing Restrictions: No      Mobility  Bed Mobility Overal bed mobility: Modified Independent                  Transfers Overall transfer level: Independent                      Ambulation/Gait Ambulation/Gait assistance: Modified independent (Device/Increase time) Gait Distance (Feet): 250 Feet Assistive device: None Gait Pattern/deviations: Step-through pattern, Decreased stride length Gait velocity: reduced Gait velocity interpretation: 1.31 - 2.62 ft/sec, indicative of limited community ambulator   General Gait Details: slowed step-through gait  Stairs             Wheelchair Mobility     Tilt Bed    Modified Rankin (Stroke Patients Only)       Balance Overall balance assessment: Needs assistance Sitting-balance support: Feet supported, No upper extremity supported Sitting balance-Leahy Scale: Good     Standing balance support: No upper extremity supported, During functional activity Standing balance-Leahy Scale: Good                               Pertinent Vitals/Pain Pain Assessment Pain Assessment: No/denies pain    Home Living Family/patient expects to be discharged to:: Private residence Living Arrangements: Spouse/significant other Available Help at Discharge: Family;Available PRN/intermittently Type of Home: House Home Access: Stairs to enter   [employer] of Steps: 1-2   Home Layout: One level Home Equipment: None      Prior Function Prior Level of Function : Independent/Modified Independent             Mobility Comments: no assistive device. has been needing 2 L02 recently. limited time in the community       Extremity/Trunk Assessment   Upper Extremity Assessment Upper Extremity Assessment: Overall WFL for tasks assessed    Lower Extremity Assessment Lower Extremity Assessment: Overall WFL for tasks assessed    Cervical / Trunk Assessment Cervical / Trunk Assessment: Normal  Communication   Communication Communication: No apparent difficulties Cueing Techniques: Verbal cues  Cognition Arousal: Alert Behavior During Therapy: WFL for tasks assessed/performed Overall Cognitive Status: Within Functional Limits for tasks assessed  General Comments: Ipad interpreter used throughout session.        General Comments General comments (skin integrity, edema, etc.): pt on 4L [place] at rest, sats in high 80s upon PT arrival. Pt requires 5L at rest during session to maintain sats in low 90s. Pt ambulates on 6L with sats in low 90s, unable  to assess ambulation on 5L on O2 tank    Exercises     Assessment/Plan    PT Assessment Patient needs continued PT services  PT Problem List Decreased activity tolerance;Cardiopulmonary status limiting activity       PT Treatment Interventions Therapeutic exercise;Gait training;Patient/family education    PT Goals (Current goals can be found in the Care Plan section)  Acute Rehab PT Goals Patient Stated Goal: to return home PT Goal Formulation: With patient Time For Goal Achievement: 03/11/23 Potential to Achieve Goals: Good Additional Goals Additional Goal #1: Pt will ambulate for >500', reporting 1/4 DOE or less, to demonstrate improved activity tolerance.    Frequency Min 1X/week     Co-evaluation               AM-PAC PT "6 Clicks" Mobility  Outcome Measure Help needed turning from your back to your side while in a flat bed without using bedrails?: None Help needed moving from lying on your back to sitting on the side of a flat bed without using bedrails?: None Help needed moving to and from a bed to a chair (including a wheelchair)?: None Help needed standing up from a chair using your arms (e.g., wheelchair or bedside chair)?: None Help needed to walk in hospital room?: None Help needed climbing 3-5 steps with a railing? : A Little 6 Click Score: 23    End of Session Equipment Utilized During Treatment: Oxygen Activity Tolerance: Patient tolerated treatment well Patient left: in chair;with call bell/phone within reach Nurse Communication: Mobility status PT Visit Diagnosis: Other abnormalities of gait and mobility (R26.89)    Time: 2956-2130 PT Time Calculation (min) (ACUTE ONLY): 23 min   Charges:   PT Evaluation $PT Eval Low Complexity: 1 Low   PT General Charges $$ ACUTE PT VISIT: 1 Visit         Arlyss Gandy, PT, DPT Acute Rehabilitation Office [phone]   Arlyss Gandy 02/25/2023, 5:25 PM

## 2023-02-26 ENCOUNTER — Telehealth: Payer: Self-pay | Admitting: Internal Medicine

## 2023-02-26 ENCOUNTER — Inpatient Hospital Stay (HOSPITAL_COMMUNITY): Payer: MEDICAID

## 2023-02-26 DIAGNOSIS — R0602 Shortness of breath: Principal | ICD-10-CM

## 2023-02-26 DIAGNOSIS — J849 Interstitial pulmonary disease, unspecified: Secondary | ICD-10-CM

## 2023-02-26 LAB — BASIC METABOLIC PANEL
Anion gap: 9 (ref 5–15)
BUN: 18 mg/dL (ref 6–20)
CO2: 27 mmol/L (ref 22–32)
Calcium: 9.2 mg/dL (ref 8.9–10.3)
Chloride: 100 mmol/L (ref 98–111)
Creatinine, Ser: 0.47 mg/dL (ref 0.44–1.00)
GFR, Estimated: >60 mL/min (ref >60)
Glucose, Bld: 101 mg/dL — ABNORMAL HIGH (ref 70–99)
Potassium: 3.3 mmol/L — ABNORMAL LOW (ref 3.5–5.1)
Sodium: 136 mmol/L (ref 135–145)

## 2023-02-26 LAB — CBC
HCT: 40.4 % (ref 36.0–46.0)
Hemoglobin: 13.7 g/dL (ref 12.0–15.0)
MCH: 30.1 pg (ref 26.0–34.0)
MCHC: 33.9 g/dL (ref 30.0–36.0)
MCV: 88.8 fL (ref 80.0–100.0)
Platelets: 345 10*3/uL (ref 150–400)
RBC: 4.55 MIL/uL (ref 3.87–5.11)
RDW: 11.9 % (ref 11.5–15.5)
WBC: 11 10*3/uL — ABNORMAL HIGH (ref 4.0–10.5)
nRBC: 0 % (ref 0.0–0.2)

## 2023-02-26 LAB — MAGNESIUM: Magnesium: 2.3 mg/dL (ref 1.7–2.4)

## 2023-02-26 MED ORDER — POTASSIUM CHLORIDE CRYS ER 20 MEQ PO TBCR
40.0000 meq | EXTENDED_RELEASE_TABLET | Freq: Once | ORAL | Status: AC
  Administered 2023-02-26: 40 meq via ORAL
  Filled 2023-02-26: qty 2

## 2023-02-26 MED ORDER — PREDNISONE 20 MG PO TABS
40.0000 mg | ORAL_TABLET | Freq: Every day | ORAL | Status: DC
  Administered 2023-02-27: 40 mg via ORAL
  Filled 2023-02-26: qty 2

## 2023-02-26 NOTE — TOC Progression Note (Addendum)
Transition of Care [facility]) - Progression Note    Patient Details  Name: Olivia [name] MRN: [id number] Date of Birth: [birth year]  Transition of Care [facility]) CM/SW Contact  Tom-Johnson, Hershal Coria, RN Phone Number: 02/26/2023, 3:41 PM  Clinical Narrative:     Patient's O2 requirement increase to 6L. Order placed for Home O2 @ 6L. Patient's O2 concentrator at home is self owned and does not have Aeronautical engineer. Hospital agreed to do an LOG for a month and patient agreed to pay out of pocket monthly with [employer]. CM filled out LOG form and delivered it at the CM's office. CM notified Financial Navigator for financial assistance.   CM will continue to follow.   16:00- Financial Navigator have assigned patient over to the [place] team so that patient can be screened for Medicaid.    CM will continue to follow.     Expected Discharge Plan and Services                                               Social Determinants of Health (SDOH) Interventions SDOH Screenings   Food Insecurity: No Food Insecurity (02/19/2023)  Housing: Low Risk  (02/19/2023)  Transportation Needs: No Transportation Needs (02/19/2023)  Utilities: Not At Risk (02/19/2023)  Depression (PHQ2-9): Low Risk  (11/12/2021)  Financial Resource Strain: High Risk (07/08/2021)  Tobacco Use: Low Risk  (02/19/2023)    Readmission Risk Interventions    02/20/2023    3:04 PM  Readmission Risk Prevention Plan  Post Dischage Appt Complete  Medication Screening Complete  Transportation Screening Complete

## 2023-02-26 NOTE — Progress Notes (Signed)
Overview: Olivia [name] is a 57 y.o. with a pertinent PMH of idiopathic pulmonary fibrosis presented with acute on chronic hypoxic respiratory failure after recently quitting perfenidone and prednisone admitted for treatment of her IPF exacerabtion.  Overnight: NAEON  Subjective:  Feels much better, slept well ON.   Objective:   Vital signs in last 24 hours:  Vitals:   02/25/23 2014 02/26/23 0447 02/26/23 0819 02/26/23 1559  BP: 105/89 107/75 107/81 111/76  Pulse: 100 84 90 88  Resp: 18 18 19 19   Temp: 97.6 F (36.4 C) 98.3 F (36.8 C) 98.2 F (36.8 C) 98 F (36.7 C)  TempSrc: Oral Oral    SpO2: 91% 96% 91% 96%  Weight:      Height:       Supplemental O2: Nasal Cannula Last BM Date : 02/26/23 SpO2: 96 % O2 Flow Rate (L/min): 5 L/min Filed Weights   02/19/23 1210  Weight: 63 kg    Intake/Output Summary (Last 24 hours) at 02/26/2023 1609 Last data filed at 02/26/2023 1300 Gross per 24 hour  Intake 760 ml  Output 1 ml  Net 759 ml   Net IO Since Admission: 1,091.97 mL [02/26/23 1609]  Physical Exam  General: NAD, sitting in bed HENT:  in place Lungs: Mild crackles on auscultation today Cardiovascular: tachycardic, no mrg Abdomen: no TTP MSK: no asymmetry Nails: Clubbing present Neuro: alert and oriented x4 Psych: normal mood and affect  Diagnostics    Latest Ref Rng & Units 02/26/2023    4:24 AM 02/25/2023    4:07 AM 02/24/2023    5:34 AM  CBC  WBC 4.0 - 10.5 K/uL 11.0  10.1  11.7   Hemoglobin 12.0 - 15.0 g/dL 16.1  09.6  04.5   Hematocrit 36.0 - 46.0 % 40.4  37.2  38.6   Platelets 150 - 400 K/uL 345  300  296        Latest Ref Rng & Units 02/26/2023    4:24 AM 02/25/2023    4:07 AM 02/24/2023    5:34 AM  BMP  Glucose 70 - 99 mg/dL 409  811  914   BUN 6 - 20 mg/dL 18  14  18    Creatinine 0.44 - 1.00 mg/dL 7.82  9.56  2.13   Sodium 135 - 145 mmol/L 136  138  137   Potassium 3.5 - 5.1 mmol/L 3.3  3.9  3.4   Chloride 98 - 111  mmol/L 100  100  101   CO2 22 - 32 mmol/L 27  25  26    Calcium 8.9 - 10.3 mg/dL 9.2  9.7  9.4       Assessment/Plan: Principal Problem:   NSIP (nonspecific interstitial pneumonia) (HCC) Active Problems:   GERD (gastroesophageal reflux disease)   Acute on chronic hypoxic respiratory failure (HCC)   Unintentional weight loss   Malnutrition of moderate degree  Acute on chronic hypoxic respiratory failure Hx Idiopathic pulmonary fibrosis Nonspecific interstitial pneumonia Pulmonary artery hypertension Appreciate Pulm's recommendations. Subjectively improved, at times used 6L O2 last night.This may be her new baseline. Pt to ambulate today to make sure it is safe for her to go home alone as her husband works in the mornings. No OT needs after discharge. -decrease Prednisone to 40mg  today  -cw O2 sat GOAL 88-92% -cough suppressants PRN -cw flutter valve and IS use.  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 RD consulted. Pt is on boost breeze BID and snacks TID. Reports good appetite.    #GERD Chronic. Continue pantoprazole 40 mg every day.   Diet: Normal IVF: None,None VTE: Enoxaparin Code: Full PT/OT recs: Pending Prior to Admission Living Arrangement: Home Anticipated Discharge Location: Home Barriers to Discharge: Medical Stability Dispo: Anticipated discharge in approximately 2 day(s).   [name], MD Eligha Bridegroom. [facility] Internal Medicine Residency, PGY-1  Pager: [phone] After 5 pm and on weekends: Please call the on-call pager

## 2023-02-26 NOTE — Telephone Encounter (Signed)
2 week f/u with PM or APP hospitalization f/u

## 2023-02-26 NOTE — Progress Notes (Signed)
02/26/2023 Sleeping when I came by room O2 needs stable even with diuresis; can try to keep pushing this to see if helps Will get CXR to see if any improvement over past week Drop pred to 40, will arrange 2 week f/u in clinic Home when she feels up to it Discussed with primary. Available PRN  Myrla Halsted MD PCCM

## 2023-02-26 NOTE — Plan of Care (Signed)
  Problem: Education: Goal: Knowledge of General Education information will improve Description: Including pain rating scale, medication(s)/side effects and non-pharmacologic comfort measures Outcome: Progressing   Problem: Activity: Goal: Risk for activity intolerance will decrease Outcome: Progressing   

## 2023-02-26 NOTE — Evaluation (Signed)
Occupational Therapy Evaluation Patient Details Name: Olivia [name] MRN: [id number] DOB: 21-Apr-1966 Today's Date: 02/26/2023   History of Present Illness Pt is a 57 y/o female admitted with acute on chronic hypoxic respiratory failure in setting of idiopathic pulmonary fibrosis and interstitial PNA. PMH: IPF, GERD.   Clinical Impression   Pt presenting close to functional baseline, completing ADLs independent/mod I level and ambulating in halls no AD. She does require increased 02 to ambulate than her reported baseline, pursed lip breathing education helped increased her Sp02. Educated pt on energy conservation strategies to improve activity tolerance and maintain Sp02 to her normal values. Pt has no further acute skilled OT needs, anticipate no OT follow-up.       If plan is discharge home, recommend the following: Assistance with cooking/housework    Functional Status Assessment  Patient has not had a recent decline in their functional status  Equipment Recommendations  Tub/shower seat    Recommendations for Other Services       Precautions / Restrictions Precautions Precautions: Other (comment) Precaution Comments: monitor sats Restrictions Weight Bearing Restrictions: No      Mobility Bed Mobility Overal bed mobility: Modified Independent                  Transfers Overall transfer level: Independent                        Balance Overall balance assessment: Needs assistance Sitting-balance support: Feet supported, No upper extremity supported Sitting balance-Leahy Scale: Good     Standing balance support: No upper extremity supported, During functional activity Standing balance-Leahy Scale: Good                             ADL either performed or assessed with clinical judgement   ADL Overall ADL's : Modified independent                                     Functional mobility during ADLs: Modified  independent General ADL Comments: Pt reports ambulating to toilet independently no AD, reinforced the use of lines/lead management to reduce risk of falls. Educated pt on energy conservation strategies with handout provided     Vision         Perception         Praxis         Pertinent Vitals/Pain Pain Assessment Pain Assessment: Faces Faces Pain Scale: Hurts a little bit Pain Location: in her mid back following hall ambulation Pain Intervention(s): Repositioned, Monitored during session (cues for upright standing posture)     Extremity/Trunk Assessment Upper Extremity Assessment Upper Extremity Assessment: Overall WFL for tasks assessed   Lower Extremity Assessment Lower Extremity Assessment: Overall WFL for tasks assessed   Cervical / Trunk Assessment Cervical / Trunk Assessment: Normal   Communication Communication Communication: No apparent difficulties Cueing Techniques: Verbal cues   Cognition Arousal: Alert Behavior During Therapy: WFL for tasks assessed/performed Overall Cognitive Status: Within Functional Limits for tasks assessed                                 General Comments: family used as interpreter based on their preference     General Comments  pt on 5L 02 at rest, tabletop 02 monitor reading 90-93%  sp02 but digital 02 reading 99%. Pt 02 on 4L during hall mobility was 91% which improved up to 95% with standing break and pursed lip breathing.    Exercises     Shoulder Instructions      Home Living Family/patient expects to be discharged to:: Private residence Living Arrangements: Spouse/significant other Available Help at Discharge: Family;Available PRN/intermittently Type of Home: House Home Access: Stairs to enter [employer] of Steps: 1-2   Home Layout: One level     Bathroom Shower/Tub: Chief Strategy Officer: Standard     Home Equipment: None   Additional Comments: uses 2L 02 at baseline       Prior Functioning/Environment Prior Level of Function : Independent/Modified Independent             Mobility Comments: no assistive device. has been needing 2 L02 recently. limited time in the community ADLs Comments: ind        OT Problem List: Cardiopulmonary status limiting activity      OT Treatment/Interventions: Self-care/ADL training;Balance training;Therapeutic exercise;Therapeutic activities;Patient/family education    OT Goals(Current goals can be found in the care plan section) Acute Rehab OT Goals Patient Stated Goal: "To be good enough to go home" OT Goal Formulation: All assessment and education complete, DC therapy Time For Goal Achievement: 03/12/23 Potential to Achieve Goals: Good ADL Goals Additional ADL Goal #1: Pt will demonstrate independent management of 02 line to simulate safe room level ambulation at home Additional ADL Goal #3: Pt will demonstrate independent use of energy conservation strategies prn to improve activity tolerance during functional activity  OT Frequency: Min 1X/week    Co-evaluation              AM-PAC OT "6 Clicks" Daily Activity     Outcome Measure Help from another person eating meals?: None Help from another person taking care of personal grooming?: None Help from another person toileting, which includes using toliet, bedpan, or urinal?: None Help from another person bathing (including washing, rinsing, drying)?: None Help from another person to put on and taking off regular upper body clothing?: None Help from another person to put on and taking off regular lower body clothing?: None 6 Click Score: 24   End of Session Equipment Utilized During Treatment: Oxygen Nurse Communication: Mobility status  Activity Tolerance: Patient tolerated treatment well Patient left: in bed;with call bell/phone within reach  OT Visit Diagnosis: Other (comment) (increased 02 demands)                Time: 1308-6578 OT Time  Calculation (min): 27 min Charges:  OT General Charges $OT Visit: 1 Visit OT Evaluation $OT Eval Low Complexity: 1 Low OT Treatments $Therapeutic Activity: 8-22 mins  02/26/2023  AB, OTR/L  Acute Rehabilitation Services  Office: [phone]   Tristan Schroeder 02/26/2023, 1:26 PM

## 2023-02-27 ENCOUNTER — Other Ambulatory Visit (HOSPITAL_COMMUNITY): Payer: Self-pay

## 2023-02-27 LAB — BASIC METABOLIC PANEL
Anion gap: 9 (ref 5–15)
BUN: 16 mg/dL (ref 6–20)
CO2: 27 mmol/L (ref 22–32)
Calcium: 9.1 mg/dL (ref 8.9–10.3)
Chloride: 97 mmol/L — ABNORMAL LOW (ref 98–111)
Creatinine, Ser: 0.53 mg/dL (ref 0.44–1.00)
GFR, Estimated: >60 mL/min (ref >60)
Glucose, Bld: 92 mg/dL (ref 70–99)
Potassium: 3.3 mmol/L — ABNORMAL LOW (ref 3.5–5.1)
Sodium: 133 mmol/L — ABNORMAL LOW (ref 135–145)

## 2023-02-27 LAB — URINALYSIS, ROUTINE W REFLEX MICROSCOPIC
Bilirubin Urine: NEGATIVE
Glucose, UA: NEGATIVE mg/dL
Hgb urine dipstick: NEGATIVE
Ketones, ur: NEGATIVE mg/dL
Leukocytes,Ua: NEGATIVE
Nitrite: NEGATIVE
Protein, ur: NEGATIVE mg/dL
Specific Gravity, Urine: 1.005 (ref 1.005–1.030)
pH: 7 (ref 5.0–8.0)

## 2023-02-27 LAB — CBC
HCT: 40.3 % (ref 36.0–46.0)
Hemoglobin: 13.7 g/dL (ref 12.0–15.0)
MCH: 30.2 pg (ref 26.0–34.0)
MCHC: 34 g/dL (ref 30.0–36.0)
MCV: 89 fL (ref 80.0–100.0)
Platelets: 383 10*3/uL (ref 150–400)
RBC: 4.53 MIL/uL (ref 3.87–5.11)
RDW: 11.9 % (ref 11.5–15.5)
WBC: 12.1 10*3/uL — ABNORMAL HIGH (ref 4.0–10.5)
nRBC: 0 % (ref 0.0–0.2)

## 2023-02-27 LAB — MAGNESIUM: Magnesium: 2.1 mg/dL (ref 1.7–2.4)

## 2023-02-27 MED ORDER — ALBUTEROL SULFATE HFA 108 (90 BASE) MCG/ACT IN AERS
2.0000 | INHALATION_SPRAY | RESPIRATORY_TRACT | 0 refills | Status: DC | PRN
  Filled 2023-02-27: qty 6.7, 30d supply, fill #0

## 2023-02-27 MED ORDER — POTASSIUM CHLORIDE CRYS ER 20 MEQ PO TBCR
40.0000 meq | EXTENDED_RELEASE_TABLET | Freq: Once | ORAL | Status: AC
  Administered 2023-02-27: 40 meq via ORAL
  Filled 2023-02-27: qty 2

## 2023-02-27 MED ORDER — BENZONATATE 100 MG PO CAPS
100.0000 mg | ORAL_CAPSULE | Freq: Three times a day (TID) | ORAL | 0 refills | Status: DC
  Filled 2023-02-27: qty 20, 7d supply, fill #0

## 2023-02-27 MED ORDER — PANTOPRAZOLE SODIUM 40 MG PO TBEC
40.0000 mg | DELAYED_RELEASE_TABLET | Freq: Every day | ORAL | 0 refills | Status: DC
  Filled 2023-02-27: qty 30, 30d supply, fill #0

## 2023-02-27 MED ORDER — GUAIFENESIN-DM 100-10 MG/5ML PO SYRP
5.0000 mL | ORAL_SOLUTION | ORAL | 0 refills | Status: AC | PRN
  Filled 2023-02-27: qty 118, 4d supply, fill #0

## 2023-02-27 MED ORDER — SALINE SPRAY 0.65 % NA SOLN
1.0000 | NASAL | 0 refills | Status: DC | PRN
  Filled 2023-02-27: qty 88, 30d supply, fill #0

## 2023-02-27 MED ORDER — PREDNISONE 20 MG PO TABS
40.0000 mg | ORAL_TABLET | Freq: Every day | ORAL | 0 refills | Status: DC
  Filled 2023-02-27: qty 38, 19d supply, fill #0

## 2023-02-27 MED ORDER — PHENOL 1.4 % MT LIQD
1.0000 | OROMUCOSAL | 0 refills | Status: DC | PRN
  Filled 2023-02-27: qty 177, fill #0

## 2023-02-27 NOTE — TOC Transition Note (Signed)
Transition of Care [facility]) - CM/SW Discharge Note   Patient Details  Name: Olivia [name] MRN: [id number] Date of Birth: 09-26-1965  Transition of Care [facility]) CM/SW Contact:  Tom-Johnson, Hershal Coria, RN Phone Number: 02/27/2023, 3:05 PM   Clinical Narrative:     Patient is scheduled for discharge today.  Readmission Risk Assessment done. Outpatient f/u, hospital f/u and discharge instructions on AVS. Prescriptions sent to [facility] pharmacy and meds will be delivered to patient at bedside prior discharge. Portable O2 delivered to patient at bedside prior to discharge.  Family to transport at discharge.  No further TOC needs noted.          Final next level of care: Home/Self Care Barriers to Discharge: Barriers Resolved   Patient Goals and CMS Choice CMS Medicare.gov Compare Post Acute Care list provided to:: Patient Choice offered to / list presented to : Patient  Discharge Placement                  Patient to be transferred to facility by: Husband Name of family member notified: [facility] Services Additional resources added to the After Visit Summary for                  DME Arranged: Oxygen DME Agency: [employer] Date DME Agency Contacted: 02/26/23 Time DME Agency Contacted: 1222 Representative spoke with at DME Agency: Earna Coder HH Arranged: NA HH Agency: NA        Social Determinants of Health (SDOH) Interventions SDOH Screenings   Food Insecurity: No Food Insecurity (02/19/2023)  Housing: Low Risk  (02/19/2023)  Transportation Needs: No Transportation Needs (02/19/2023)  Utilities: Not At Risk (02/19/2023)  Depression (PHQ2-9): Low Risk  (11/12/2021)  Financial Resource Strain: High Risk (07/08/2021)  Tobacco Use: Low Risk  (02/19/2023)     Readmission Risk Interventions    02/20/2023    3:04 PM  Readmission Risk Prevention Plan  Post Dischage Appt Complete  Medication Screening Complete  Transportation  Screening Complete

## 2023-02-27 NOTE — Discharge Summary (Signed)
Name: Olivia [name] MRN: [id number] DOB: 11-Aug-1965 57 y.o. PCP: Faith Rogue, DO  Date of Admission: 02/19/2023 11:59 AM Date of Discharge:  02/27/2023 Attending Physician: Dr.  Ninetta Lights  DISCHARGE DIAGNOSIS:  Primary Problem: NSIP (nonspecific interstitial pneumonia) [facility])   Hospital Problems: Principal Problem:   NSIP (nonspecific interstitial pneumonia) (HCC) Active Problems:   GERD (gastroesophageal reflux disease)   Acute on chronic hypoxic respiratory failure (HCC)   Unintentional weight loss   Malnutrition of moderate degree   SOB (shortness of breath)   ILD (interstitial lung disease) (HCC)    DISCHARGE MEDICATIONS:   Allergies as of 02/27/2023       Reactions   Miralax [polyethylene Glycol] Rash        Medication List     STOP taking these medications    NYQUIL PO   omeprazole 20 MG tablet Commonly known as: PRILOSEC OTC       TAKE these medications    albuterol 108 (90 Base) MCG/ACT inhaler Commonly known as: VENTOLIN HFA Inhale 2 puffs into the lungs every 4 (four) hours as needed for wheezing or shortness of breath.   benzonatate 100 MG capsule Commonly known as: TESSALON Take 1 capsule (100 mg total) by mouth 3 (three) times daily.   Deep Sea Nasal Spray 0.65 % nasal spray Generic drug: sodium chloride Place 1 spray into both nostrils as needed (for dry nose).   gabapentin 100 MG capsule Commonly known as: NEURONTIN Take 100 mg by mouth 3 (three) times daily.   guaiFENesin-dextromethorphan 100-10 MG/5ML syrup Commonly known as: ROBITUSSIN DM Take 5 mLs by mouth every 4 (four) hours as needed for cough.   pantoprazole 40 MG tablet Commonly known as: PROTONIX Tome 1 tableta (40 mg en total) por va oral diariamente. (Take 1 tablet (40 mg total) by mouth daily.) Start taking on: February 28, 2023   phenol 1.4 % Liqd Commonly known as: CHLORASEPTIC Use as directed 1 spray in the mouth or throat as needed for throat  irritation / pain.   predniSONE 20 MG tablet Commonly known as: DELTASONE Take 2 tablets (40 mg total) by mouth daily with breakfast.               Durable Medical Equipment  (From admission, onward)           Start     Ordered   02/27/23 1148  DME Shower stool  Once        02/27/23 1147   02/27/23 1148  DME Tub Bench  Once        02/27/23 1147   02/26/23 1538  For home use only DME oxygen  Once       Question Answer Comment  Length of Need 6 Months   Mode or (Route) Nasal cannula   Liters per Minute 6   Frequency Continuous (stationary and portable oxygen unit needed)   Oxygen conserving device Yes   Oxygen delivery system Gas      02/26/23 1538   02/20/23 1536  For home use only DME Tub bench  Once        02/20/23 1535            DISPOSITION AND FOLLOW-UP:  Olivia [name] was discharged from [facility] in Stable condition. At the hospital follow up visit please address:  Follow-up Recommendations: Consults: PCCM Labs: Basic Metabolic Profile and CBC Medications: Will need maintenance therapy for pulmonary fibrosis  Follow-up Appointments:  Follow-up Information  [facility] Follow up.   Specialty: Pulmonology Why: 03/18/2023 a las 11:30AM Contact information: [address] [place] [address] [phone]        Olegario Messier, MD Follow up.   Specialty: Internal Medicine Why: 7 de noviembre a la 1:15pm Contact information: [address] [place] [address] [phone]                 HOSPITAL COURSE:  Patient Summary: Olivia [name] is a 57 y.o. with a PMH of idiopathic pulmonary fibrosis who presented with worsening dyspnea of one month duration. She had stopped pirfenidone due to worsening GI side effects and weakness and was not on prednisone. Prior to this she would use 2-3L of Oxygen at night only. On admission she  reported requiring 2-3L at all times. She also reported chills, myalgias, fatigue and decreased appetite and a unintentional weight loss of over 30 lbs in the last three months. She was admitted for acute on chronic hypoxic respiratory failure.   Acute on chronic hypoxic respiratory failure Nonspecific interstitial pneumonia Follows [facility]. Had not seen them since stopping her medications. CTA not concerning for PE, but pulmonary fibrosis may be worsening slightly. CXR worrisome for infection/edema in addition to lung fibrosis. However, on exam she did not appear fluid overloaded, does not have JVD, very mild edema. Echo with normal RV function, IVC with >50% variability. RVP was negative for covid, RSV, flu.  PCCM was consulted and was started of Solumedrol 100 mg x 3 days and Prednisone 60 mg daily until 10/24 until she transitioned to Prednisone 40 mg daily. Tessalon pearls, Rubitussin DM, Chloraseptic spray, flutter valve, and incentive spirometry helped her with her cough. O2 sat GOAL 88-92%  She is to: -Continue on Prednisone 40 mg daily until she follows with PCCM in 2 weeks -FU with PCCM in 2 weeks  -Continue with supportive measures as mentioned above  -Consider RFP at next follow up.   Dyspepsia #Hx of GERD Pt complained of epigastric pain. Omeprazole discontinued. Pt took pantoprazole 40mg  daily during hospitalization. -Cw Pantoprazole 40mg  daily  -Please ensure patient had switched to pantoprazole.   Unintentional weight loss Likely due to increased work of breathing. RD was consulted and was advised to use boost breeze BID and snacks TID. She is instructed to buy Ensure Max Protein BID and snack three times a day.  -Please continue to monitor  Dysuria On day of discharge pt complained of dysuria. UA was negative for UTI.  -Please follow up during her next visit should symptoms remain.   DISCHARGE INSTRUCTIONS:   [name],   Muchas gracias por  permitirnos cuidar de usted. Se presento al hospital con deteriorioriamiento de su respiracion y [place] incrementada de oxigeno, fue admitida por falla respiratoria. Usted tiene fibrosis pulmonar. Consultamos con pulmonologia y empezo esteroides. Usted necesita 6L de oxigeno cuando esta ambulando. Necesita approximadamente 4-6L de Oxigeno cuando esta sentada/descansando. Su oxigenacion debe estar entre 88-92% a todo tiempo. Por favor utilice su oximetro para revisar su oxigenacion.  Por favor venga a vernos en la clinica despues de su hospitalizacion para su seguimiento. Tiene cita el 7 de noviembre a la 1:15pm.   Por favor tambien es muy importante que vea al pulmunologo. Su cita es el 03/18/2023 a las 11:30AM   Hasta su cita con el pulmunologo:   Tome prednisona 40mg  (2 tabletas) diarias hasta que vea al pulmonologo. No deje de tomar [employer]  medicamento sin llamar a Pulmonologia primero.  Puede usar los siguientes medicamentos para ayudarla con su tos:  Tessalon 1 tableta tres veces al dia  Chloraseptic spay 1 spray en la garganta para ayudarla con la irritacion  Robitussin DM 5mL cada 4 horas cuando sea necesario para la tos  albuterol 2 soplos inhalados cada cuatro horas cuando sea necesario   Para su perdida de peso, por favor [employer] Max Protein que se puede obtener en [employer] [facility] ensures cada dia. Tambien porfavor coma tres meriendas al dia encima de sus comidas principales.  Para su dolor estomacal porfavor tome pantoprazole 40mg  diarios. Discontinue omeprazole 20mg  (ya no lo tome).   Para su dolor estomacal e irritacion al orinar: Hemos hecho un analisis de [place], y no tiene una infeccion urinaria. Si continua con simptomas, por favor haganoslo saber durante su seguimiento. Si es que sus simptomas empeoran, por favor llamenos a Event organiser [phone]. Si su estomago se hincha, el dolor se vuelve intolerable, no puede pasar gas o no tiene movimientos intestinales por favor  dirigase a la sala de emergencia inmediatamente.    Por favor no dude en comunicarse con nosotros si es que tiene alguna pregunta.   Sinceramente,  [name], MD  PGY-1   Discharge Instructions     Call MD for:  difficulty breathing, headache or visual disturbances   Complete by: As directed    Call MD for:  difficulty breathing, headache or visual disturbances   Complete by: As directed    Call MD for:  extreme fatigue   Complete by: As directed    Call MD for:  extreme fatigue   Complete by: As directed    Call MD for:  hives   Complete by: As directed    Call MD for:  hives   Complete by: As directed    Call MD for:  persistant dizziness or light-headedness   Complete by: As directed    Call MD for:  persistant dizziness or light-headedness   Complete by: As directed    Call MD for:  persistant nausea and vomiting   Complete by: As directed    Call MD for:  persistant nausea and vomiting   Complete by: As directed    Call MD for:  redness, tenderness, or signs of infection (pain, swelling, redness, odor or green/yellow discharge around incision site)   Complete by: As directed    Call MD for:  redness, tenderness, or signs of infection (pain, swelling, redness, odor or green/yellow discharge around incision site)   Complete by: As directed    Call MD for:  severe uncontrolled pain   Complete by: As directed    Call MD for:  severe uncontrolled pain   Complete by: As directed    Call MD for:  temperature >100.4   Complete by: As directed    Call MD for:  temperature >100.4   Complete by: As directed    Diet - low sodium heart healthy   Complete by: As directed    Increase activity slowly   Complete by: As directed    Increase activity slowly   Complete by: As directed        SUBJECTIVE:  Feeling better today. Feels like she can keep at getting stronger at home.   Discharge Vitals:   BP 109/82   Pulse 95   Temp 97.7 F (36.5 C)   Resp 18    Ht 4\' 11"  (1.499 m)  Wt 63 kg   LMP  (LMP Unknown)   SpO2 95%   BMI 28.07 kg/m   OBJECTIVE:  Physical Exam Constitutional:      General: She is not in acute distress.    Appearance: She is well-developed. She is not ill-appearing or toxic-appearing.  Cardiovascular:     Rate and Rhythm: Normal rate and regular rhythm.     Heart sounds: S1 normal and S2 normal. No murmur heard.    No friction rub.  Pulmonary:     Effort: No tachypnea, accessory muscle usage or respiratory distress.     Breath sounds: Rales present. No decreased breath sounds, wheezing or rhonchi.  Abdominal:     General: Bowel sounds are normal.     Palpations: Abdomen is soft.     Tenderness: There is abdominal tenderness in the epigastric area.  Musculoskeletal:     Right lower leg: No edema.     Left lower leg: No edema.  Neurological:     Mental Status: She is alert.     Pertinent Labs, Studies, and Procedures:     Latest Ref Rng & Units 02/27/2023    4:45 AM 02/26/2023    4:24 AM 02/25/2023    4:07 AM  CBC  WBC 4.0 - 10.5 K/uL 12.1  11.0  10.1   Hemoglobin 12.0 - 15.0 g/dL 08.6  57.8  46.9   Hematocrit 36.0 - 46.0 % 40.3  40.4  37.2   Platelets 150 - 400 K/uL 383  345  300        Latest Ref Rng & Units 02/27/2023    4:45 AM 02/26/2023    4:24 AM 02/25/2023    4:07 AM  CMP  Glucose 70 - 99 mg/dL 92  629  528   BUN 6 - 20 mg/dL 16  18  14    Creatinine 0.44 - 1.00 mg/dL 4.13  2.44  0.10   Sodium 135 - 145 mmol/L 133  136  138   Potassium 3.5 - 5.1 mmol/L 3.3  3.3  3.9   Chloride 98 - 111 mmol/L 97  100  100   CO2 22 - 32 mmol/L 27  27  25    Calcium 8.9 - 10.3 mg/dL 9.1  9.2  9.7     CT Angio Chest PE W and/or Wo Contrast  Result Date: 02/19/2023 CLINICAL DATA:  Shortness of breath EXAM: CT ANGIOGRAPHY CHEST WITH CONTRAST TECHNIQUE: Multidetector CT imaging of the chest was performed using the standard protocol during bolus administration of intravenous contrast. Multiplanar CT image  reconstructions and MIPs were obtained to evaluate the vascular anatomy. RADIATION DOSE REDUCTION: This exam was performed according to the departmental dose-optimization program which includes automated exposure control, adjustment of the mA and/or kV according to patient size and/or use of iterative reconstruction technique. CONTRAST:  75mL OMNIPAQUE IOHEXOL 350 MG/ML SOLN COMPARISON:  Most recent, high-resolution chest CT dated March 02, 2022 FINDINGS: Cardiovascular: No evidence of pulmonary embolus. Normal heart size. No pericardial effusion. Normal caliber thoracic aorta with mild atherosclerotic disease. Dilated main pulmonary artery, measuring up to 3.1 cm. Mediastinum/Nodes: Small hiatal hernia. Thyroid is unremarkable. Mildly enlarged bilateral hilar lymph nodes. Reference right hilar lymph node measuring 12 mm in short axis on series 5, image 69, unchanged when compared with prior and likely reactive. Lungs/Pleura: Central airways are patent. Lower lung and peribronchovascular predominant reticular and ground-glass opacities with associated traction bronchiectasis. Scattered areas of cystic change. Probable slight progression with new mild peribronchovascular  reticular and ground-glass opacity seen in the upper lobes, differences in exam technique somewhat limit evaluation. Peribronchovascular no pleural effusion or pneumothorax. Upper Abdomen: Prior cholecystectomy.  No acute abnormality. Musculoskeletal: No chest wall abnormality. No acute or significant osseous findings. Review of the MIP images confirms the above findings. IMPRESSION: 1. No evidence of pulmonary embolus. 2. Fibrotic interstitial lung disease, favor fibrotic NSIP. Possible slight progression in the bilateral upper lobes when compared with the prior exam, although exam technique limits evaluation. Follow-up with ILD protocol chest CT after resolution of the acute symptoms is recommended for further evaluation. Findings are suggestive  of an alternative diagnosis (not UIP) per consensus guidelines: Diagnosis of Idiopathic Pulmonary Fibrosis: An Official ATS/ERS/JRS/ALAT Clinical Practice Guideline. Am Rosezetta Schlatter Crit Care Med Vol 198, Iss 5, [phone], Jan 04 2017. 3. Dilated main pulmonary artery, findings can be seen in setting of pulmonary hypertension. 4. Aortic Atherosclerosis (ICD10-I70.0). Electronically Signed   By: Allegra Lai M.D.   On: 02/19/2023 19:02   DG Chest 2 View  Result Date: 02/19/2023 CLINICAL DATA:  Constant cough and low oxygen saturations EXAM: CHEST - 2 VIEW COMPARISON:  11/12/2021 FINDINGS: Stable cardiomediastinal silhouette. Pulmonary vascular congestion. Increased hazy bilateral airspace and interstitial opacities greatest in the lower lungs. No pleural effusion or pneumothorax. IMPRESSION: Findings suggestive of infection or edema superimposed on a background of chronic interstitial lung disease. Electronically Signed   By: Minerva Fester M.D.   On: 02/19/2023 15:04     Signed: [name], MD Internal Medicine Resident, PGY-1 [facility]  Pager: [phone] 2:59 PM, 02/27/2023

## 2023-02-27 NOTE — Discharge Instructions (Addendum)
Olivia [name],   Muchas gracias por permitirnos cuidar de usted. Se presento al hospital con deteriorioriamiento de su respiracion y [place] incrementada de oxigeno, fue admitida por falla respiratoria. Usted tiene fibrosis pulmonar. Consultamos con pulmonologia y empezo esteroides. Usted necesita 6L de oxigeno cuando esta ambulando. Necesita approximadamente 4-6L de Oxigeno cuando esta sentada/descansando. Su oxigenacion debe estar entre 88-92% a todo tiempo. Por favor utilice su oximetro para revisar su oxigenacion.  Por favor venga a vernos en la clinica despues de su hospitalizacion para su seguimiento. Tiene cita el 7 de noviembre a la 1:15pm.   Por favor tambien es muy importante que vea al pulmunologo. Su cita es el 03/18/2023 a las 11:30AM   Hasta su cita con el pulmunologo:   Tome prednisona 40mg  (2 tabletas) diarias hasta que vea al pulmonologo. No deje de tomar este medicamento sin llamar a Pulmonologia primero.  Puede usar los siguientes medicamentos para ayudarla con su tos:  Tessalon 1 tableta tres veces al dia  Chloraseptic spay 1 spray en la garganta para ayudarla con la irritacion  Robitussin DM 5mL cada 4 horas cuando sea necesario para la tos  albuterol 2 soplos inhalados cada cuatro horas cuando sea necesario   Para su perdida de peso, por favor [employer] Max Protein que se puede obtener en [employer] [facility] ensures cada dia. Tambien porfavor coma tres meriendas al dia encima de sus comidas principales.  Para su dolor estomacal porfavor tome pantoprazole 40mg  diarios. Discontinue omeprazole 20mg  (ya no lo tome).   Para su dolor estomacal e irritacion al orinar: Hemos hecho un analisis de [place], y no tiene una infeccion urinaria. Si continua con simptomas, por favor haganoslo saber durante su seguimiento. Si es que sus simptomas empeoran, por favor llamenos a Event organiser [phone]. Si su estomago se hincha, el dolor se vuelve intolerable, no puede pasar gas o  no tiene movimientos intestinales por favor dirigase a la sala de emergencia inmediatamente.    Por favor no dude en comunicarse con nosotros si es que tiene alguna pregunta.   Sinceramente,  [name], MD  PGY-1

## 2023-02-27 NOTE — Progress Notes (Signed)
Olivia [name] to be discharged Home per MD order. Discussed prescriptions and follow up appointments with the patient. Prescriptions given to patient; medication list explained in detail with the help of interpreter. Patient verbalized understanding.  Oxygen tanks, BSC delivered to room before discharge and also TOC medications.  Skin clean, dry and intact without evidence of skin break down, no evidence of skin tears noted. IV catheter discontinued intact. Site without signs and symptoms of complications. Dressing and pressure applied. Pt denies pain at the site currently. No complaints noted.  Patient free of lines, drains, and wounds.   An After Visit Summary (AVS) was printed and given to the patient. Patient escorted via wheelchair, and discharged home via private auto.  Arvilla Meres, RN

## 2023-02-28 ENCOUNTER — Telehealth: Payer: Self-pay

## 2023-02-28 NOTE — Transitions of Care (Post Inpatient/ED Visit) (Signed)
02/28/2023  Name: Olivia [name] MRN: [id number] DOB: 1965/05/12  Today's TOC FU Call Status: Today's TOC FU Call Status:: Successful TOC FU Call Completed TOC FU Call Complete Date: 02/28/23 Patient's Name and Date of Birth confirmed.  Transition Care Management Follow-up Telephone Call Date of Discharge: 02/27/23 Discharge Facility: [facility]) Type of Discharge: Inpatient Admission Primary Inpatient Discharge Diagnosis:: shortness of breath How have you been since you were released from the hospital?: Better Any questions or concerns?: No  Items Reviewed: Did you receive and understand the discharge instructions provided?: Yes Medications obtained,verified, and reconciled?: Yes (Medications Reviewed) Any new allergies since your discharge?: No Dietary orders reviewed?: Yes Do you have support at home?: Yes People in Home: spouse  Medications Reviewed Today: Medications Reviewed Today     Reviewed by Karena Addison, LPN (Licensed Practical Nurse) on 02/28/23 at 1009  Med List Status: <None>   Medication Order Taking? Sig Documenting Provider Last Dose Status Informant  albuterol (VENTOLIN HFA) 108 (90 Base) MCG/ACT inhaler [id number]  Inhale 2 puffs into the lungs every 4 (four) hours as needed for wheezing or shortness of breath. Hassan Rowan, [place], MD  Active   benzonatate (TESSALON) 100 MG capsule [id number]  Take 1 capsule (100 mg total) by mouth 3 (three) times daily. Hassan Rowan, [place], MD  Active   gabapentin (NEURONTIN) 100 MG capsule [id number] No Take 100 mg by mouth 3 (three) times daily. [provider] 02/18/2023 Active Self  guaiFENesin-dextromethorphan (ROBITUSSIN DM) 100-10 MG/5ML syrup [id number]  Take 5 mLs by mouth every 4 (four) hours as needed for cough. Hassan Rowan, [place], MD  Active   pantoprazole (PROTONIX) 40 MG tablet [id number]  Take 1 tablet (40 mg total) by mouth daily. Hassan Rowan, [place], MD   Active   phenol [facility]) 1.4 % LIQD [id number]  Use as directed 1 spray in the mouth or throat as needed for throat irritation / pain. Hassan Rowan, [place], MD  Active   predniSONE (DELTASONE) 20 MG tablet [id number]  Take 2 tablets (40 mg total) by mouth daily with breakfast. Hassan Rowan, [place], MD  Active   sodium chloride (OCEAN) 0.65 % SOLN nasal spray [id number]  Place 1 spray into both nostrils as needed (for dry nose). [name], MD  Active             Home Care and Equipment/Supplies: Were Home Health Services Ordered?: NA Any new equipment or medical supplies ordered?: NA  Functional Questionnaire: Do you need assistance with bathing/showering or dressing?: No Do you need assistance with meal preparation?: No Do you need assistance with eating?: No Do you have difficulty maintaining continence: No Do you need assistance with getting out of bed/getting out of a chair/moving?: No Do you have difficulty managing or taking your medications?: No  Follow up appointments reviewed: PCP Follow-up appointment confirmed?: Yes Date of PCP follow-up appointment?: 03/13/23 Follow-up Provider: [facility] Follow-up appointment confirmed?: NA Do you need transportation to your follow-up appointment?: No Do you understand care options if your condition(s) worsen?: Yes-patient verbalized understanding   SIGNATURE Karena Addison, LPN [facility] Nurse Health Advisor Direct Dial [phone]

## 2023-03-05 ENCOUNTER — Other Ambulatory Visit: Payer: Self-pay

## 2023-03-05 ENCOUNTER — Emergency Department (HOSPITAL_COMMUNITY): Payer: MEDICAID

## 2023-03-05 ENCOUNTER — Encounter (HOSPITAL_COMMUNITY): Payer: Self-pay

## 2023-03-05 ENCOUNTER — Inpatient Hospital Stay (HOSPITAL_COMMUNITY)
Admission: EM | Admit: 2023-03-05 | Discharge: 2023-03-10 | DRG: 196 | Disposition: A | Discharge status: Home / Self Care | Payer: MEDICAID | Attending: Internal Medicine | Admitting: Internal Medicine

## 2023-03-05 DIAGNOSIS — Z9049 Acquired absence of other specified parts of digestive tract: Secondary | ICD-10-CM

## 2023-03-05 DIAGNOSIS — J690 Pneumonitis due to inhalation of food and vomit: Secondary | ICD-10-CM | POA: Diagnosis present

## 2023-03-05 DIAGNOSIS — Z555 Less than a high school diploma: Secondary | ICD-10-CM

## 2023-03-05 DIAGNOSIS — R0602 Shortness of breath: Secondary | ICD-10-CM

## 2023-03-05 DIAGNOSIS — Z5986 Financial insecurity: Secondary | ICD-10-CM

## 2023-03-05 DIAGNOSIS — E876 Hypokalemia: Secondary | ICD-10-CM | POA: Diagnosis present

## 2023-03-05 DIAGNOSIS — Z79899 Other long term (current) drug therapy: Secondary | ICD-10-CM

## 2023-03-05 DIAGNOSIS — Z603 Acculturation difficulty: Secondary | ICD-10-CM | POA: Diagnosis present

## 2023-03-05 DIAGNOSIS — K5900 Constipation, unspecified: Secondary | ICD-10-CM | POA: Diagnosis present

## 2023-03-05 DIAGNOSIS — Z833 Family history of diabetes mellitus: Secondary | ICD-10-CM

## 2023-03-05 DIAGNOSIS — Z8249 Family history of ischemic heart disease and other diseases of the circulatory system: Secondary | ICD-10-CM

## 2023-03-05 DIAGNOSIS — J8489 Other specified interstitial pulmonary diseases: Principal | ICD-10-CM | POA: Diagnosis present

## 2023-03-05 DIAGNOSIS — Z7952 Long term (current) use of systemic steroids: Secondary | ICD-10-CM

## 2023-03-05 DIAGNOSIS — R64 Cachexia: Secondary | ICD-10-CM | POA: Diagnosis present

## 2023-03-05 DIAGNOSIS — K449 Diaphragmatic hernia without obstruction or gangrene: Secondary | ICD-10-CM | POA: Diagnosis present

## 2023-03-05 DIAGNOSIS — K219 Gastro-esophageal reflux disease without esophagitis: Secondary | ICD-10-CM | POA: Diagnosis present

## 2023-03-05 DIAGNOSIS — R0902 Hypoxemia: Principal | ICD-10-CM

## 2023-03-05 DIAGNOSIS — Z888 Allergy status to other drugs, medicaments and biological substances status: Secondary | ICD-10-CM

## 2023-03-05 DIAGNOSIS — M549 Dorsalgia, unspecified: Secondary | ICD-10-CM | POA: Diagnosis present

## 2023-03-05 DIAGNOSIS — I739 Peripheral vascular disease, unspecified: Secondary | ICD-10-CM | POA: Diagnosis present

## 2023-03-05 DIAGNOSIS — J8410 Pulmonary fibrosis, unspecified: Secondary | ICD-10-CM

## 2023-03-05 DIAGNOSIS — Z8616 Personal history of COVID-19: Secondary | ICD-10-CM

## 2023-03-05 DIAGNOSIS — Z515 Encounter for palliative care: Secondary | ICD-10-CM

## 2023-03-05 DIAGNOSIS — J9621 Acute and chronic respiratory failure with hypoxia: Secondary | ICD-10-CM | POA: Diagnosis present

## 2023-03-05 DIAGNOSIS — Z825 Family history of asthma and other chronic lower respiratory diseases: Secondary | ICD-10-CM

## 2023-03-05 LAB — COMPREHENSIVE METABOLIC PANEL
ALT: 20 U/L (ref 0–44)
AST: 28 U/L (ref 15–41)
Albumin: 3.1 g/dL — ABNORMAL LOW (ref 3.5–5.0)
Alkaline Phosphatase: 69 U/L (ref 38–126)
Anion gap: 11 (ref 5–15)
BUN: 7 mg/dL (ref 6–20)
CO2: 26 mmol/L (ref 22–32)
Calcium: 8.9 mg/dL (ref 8.9–10.3)
Chloride: 101 mmol/L (ref 98–111)
Creatinine, Ser: 0.52 mg/dL (ref 0.44–1.00)
GFR, Estimated: >60 mL/min (ref >60)
Glucose, Bld: 106 mg/dL — ABNORMAL HIGH (ref 70–99)
Potassium: 3.2 mmol/L — ABNORMAL LOW (ref 3.5–5.1)
Sodium: 138 mmol/L (ref 135–145)
Total Bilirubin: 0.6 mg/dL (ref 0.3–1.2)
Total Protein: 7 g/dL (ref 6.5–8.1)

## 2023-03-05 LAB — CBC WITH DIFFERENTIAL/PLATELET
Abs Immature Granulocytes: 0.07 10*3/uL (ref 0.00–0.07)
Basophils Absolute: 0.1 10*3/uL (ref 0.0–0.1)
Basophils Relative: 0 %
Eosinophils Absolute: 0.4 10*3/uL (ref 0.0–0.5)
Eosinophils Relative: 2 %
HCT: 38.7 % (ref 36.0–46.0)
Hemoglobin: 13 g/dL (ref 12.0–15.0)
Immature Granulocytes: 0 %
Lymphocytes Relative: 23 %
Lymphs Abs: 4.1 10*3/uL — ABNORMAL HIGH (ref 0.7–4.0)
MCH: 31 pg (ref 26.0–34.0)
MCHC: 33.6 g/dL (ref 30.0–36.0)
MCV: 92.4 fL (ref 80.0–100.0)
Monocytes Absolute: 0.8 10*3/uL (ref 0.1–1.0)
Monocytes Relative: 4 %
Neutro Abs: 12.5 10*3/uL — ABNORMAL HIGH (ref 1.7–7.7)
Neutrophils Relative %: 71 %
Platelets: 366 10*3/uL (ref 150–400)
RBC: 4.19 MIL/uL (ref 3.87–5.11)
RDW: 12.2 % (ref 11.5–15.5)
WBC: 18 10*3/uL — ABNORMAL HIGH (ref 4.0–10.5)
nRBC: 0 % (ref 0.0–0.2)

## 2023-03-05 LAB — I-STAT VENOUS BLOOD GAS, ED
Acid-Base Excess: 3 mmol/L — ABNORMAL HIGH (ref 0.0–2.0)
Bicarbonate: 27.4 mmol/L (ref 20.0–28.0)
Calcium, Ion: 1.2 mmol/L (ref 1.15–1.40)
HCT: 39 % (ref 36.0–46.0)
Hemoglobin: 13.3 g/dL (ref 12.0–15.0)
O2 Saturation: 97 %
Potassium: 3.2 mmol/L — ABNORMAL LOW (ref 3.5–5.1)
Sodium: 140 mmol/L (ref 135–145)
TCO2: 29 mmol/L (ref 22–32)
pCO2, Ven: 42.2 mm[Hg] — ABNORMAL LOW (ref 44–60)
pH, Ven: 7.421 (ref 7.25–7.43)
pO2, Ven: 86 mm[Hg] — ABNORMAL HIGH (ref 32–45)

## 2023-03-05 LAB — TROPONIN I (HIGH SENSITIVITY)
Troponin I (High Sensitivity): 6 ng/L (ref <18)
Troponin I (High Sensitivity): 4 ng/L (ref <18)

## 2023-03-05 LAB — BRAIN NATRIURETIC PEPTIDE: B Natriuretic Peptide: 38.6 pg/mL (ref 0.0–100.0)

## 2023-03-05 LAB — RESP PANEL BY RT-PCR (RSV, FLU A&B, COVID)  RVPGX2
Influenza A by PCR: NEGATIVE
Influenza B by PCR: NEGATIVE
Resp Syncytial Virus by PCR: NEGATIVE
SARS Coronavirus 2 by RT PCR: NEGATIVE

## 2023-03-05 LAB — MAGNESIUM: Magnesium: 2.2 mg/dL (ref 1.7–2.4)

## 2023-03-05 MED ORDER — ACETAMINOPHEN 325 MG PO TABS
650.0000 mg | ORAL_TABLET | Freq: Four times a day (QID) | ORAL | Status: DC | PRN
  Administered 2023-03-06 – 2023-03-09 (×5): 650 mg via ORAL
  Filled 2023-03-05 (×5): qty 2

## 2023-03-05 MED ORDER — ACETAMINOPHEN 650 MG RE SUPP: 650.0000 mg | Freq: Four times a day (QID) | RECTAL | Status: DC | PRN

## 2023-03-05 MED ORDER — SODIUM CHLORIDE 0.9 % IV SOLN
1.0000 g | Freq: Once | INTRAVENOUS | Status: AC
  Administered 2023-03-05: 1 g via INTRAVENOUS
  Filled 2023-03-05: qty 10

## 2023-03-05 MED ORDER — POTASSIUM CHLORIDE CRYS ER 20 MEQ PO TBCR
60.0000 meq | EXTENDED_RELEASE_TABLET | Freq: Once | ORAL | Status: AC
  Administered 2023-03-05: 60 meq via ORAL
  Filled 2023-03-05: qty 3

## 2023-03-05 MED ORDER — ALBUTEROL SULFATE (2.5 MG/3ML) 0.083% IN NEBU
3.0000 mL | INHALATION_SOLUTION | RESPIRATORY_TRACT | Status: DC | PRN
  Administered 2023-03-07 – 2023-03-09 (×3): 3 mL via RESPIRATORY_TRACT
  Filled 2023-03-05 (×3): qty 3

## 2023-03-05 MED ORDER — SODIUM CHLORIDE 0.9 % IV SOLN
3.0000 g | Freq: Four times a day (QID) | INTRAVENOUS | Status: DC
  Administered 2023-03-05 – 2023-03-06 (×3): 3 g via INTRAVENOUS
  Filled 2023-03-05 (×3): qty 8

## 2023-03-05 MED ORDER — PHENOL 1.4 % MT LIQD: 1.0000 | OROMUCOSAL | Status: DC | PRN

## 2023-03-05 MED ORDER — SENNOSIDES-DOCUSATE SODIUM 8.6-50 MG PO TABS: 1.0000 | ORAL_TABLET | Freq: Every evening | ORAL | Status: DC | PRN

## 2023-03-05 MED ORDER — PANTOPRAZOLE SODIUM 40 MG PO TBEC
40.0000 mg | DELAYED_RELEASE_TABLET | Freq: Every day | ORAL | Status: DC
  Administered 2023-03-06 – 2023-03-10 (×5): 40 mg via ORAL
  Filled 2023-03-05 (×5): qty 1

## 2023-03-05 MED ORDER — BENZONATATE 100 MG PO CAPS
100.0000 mg | ORAL_CAPSULE | Freq: Three times a day (TID) | ORAL | Status: DC
Start: 2023-03-05 — End: 2023-03-10
  Administered 2023-03-05 – 2023-03-10 (×16): 100 mg via ORAL
  Filled 2023-03-05 (×16): qty 1

## 2023-03-05 MED ORDER — PREDNISONE 20 MG PO TABS
40.0000 mg | ORAL_TABLET | Freq: Every day | ORAL | Status: DC
  Administered 2023-03-05: 40 mg via ORAL
  Filled 2023-03-05: qty 2

## 2023-03-05 MED ORDER — SODIUM CHLORIDE 0.9 % IV SOLN
500.0000 mg | Freq: Once | INTRAVENOUS | Status: AC
  Administered 2023-03-05: 500 mg via INTRAVENOUS
  Filled 2023-03-05: qty 5

## 2023-03-05 MED ORDER — GABAPENTIN 100 MG PO CAPS
100.0000 mg | ORAL_CAPSULE | Freq: Three times a day (TID) | ORAL | Status: DC
  Administered 2023-03-05: 100 mg via ORAL
  Filled 2023-03-05: qty 1

## 2023-03-05 MED ORDER — SALINE SPRAY 0.65 % NA SOLN: 1.0000 | NASAL | Status: DC | PRN

## 2023-03-05 MED ORDER — ENOXAPARIN SODIUM 40 MG/0.4ML IJ SOSY
40.0000 mg | PREFILLED_SYRINGE | INTRAMUSCULAR | Status: DC
  Administered 2023-03-05: 40 mg via SUBCUTANEOUS
  Filled 2023-03-05: qty 0.4

## 2023-03-05 MED ORDER — GUAIFENESIN-DM 100-10 MG/5ML PO SYRP
5.0000 mL | ORAL_SOLUTION | ORAL | Status: DC | PRN
  Administered 2023-03-05 – 2023-03-06 (×3): 5 mL via ORAL
  Filled 2023-03-05 (×3): qty 5

## 2023-03-05 NOTE — H&P (Signed)
Date: 03/05/2023                         Patient Name:  Olivia [name] MRN: [id number]  DOB: 20-Dec-1965 Age / Sex: 57 y.o., female   PCP: Faith Rogue, DO               Medical Service: Internal Medicine Teaching Service               Attending Physician: Dr. Ninetta Lights, Lacretia Leigh, MD            *Please do not leave messages for physician in this sticky note. Please page appropriate physician as below.*   Weekday Hours (7AM-5PM):    First Contact: [name], MD               Pager: [phone]              Second Contact: Morene Crocker, MD       Pager: [facility] [phone]     ** If no return call within 15 minutes (after trying both pagers listed above), please call after hours pagers.   After 5 pm or weekends:  1st Contact: Pager: [phone]  2nd Contact: Pager: [phone]         Chief Complaint: shortness of breath   History of Present Illness:  Pt is a 57 year old female with past medical hx of IPF, GERD, suspected PAH presenting to the ED with worsening shortness of breath. She was hospitalized for the same in 10/16-10/24 where she was started on high dose steroids. She stated she was doing well for 5 days but started having worsening shortness of breath yesterday with her saturation dropping into the 80s with exertion. She states this am she woke up with shortness of breath with oxygen in the 50s. Hx is slightly unclear as she and her husband report the oxygen got accidentally disconnected causing this to occur. She does not feel like her breathing is at baseline and has worsened since yesterday. She does report a vomiting episode yesterday and states she does get these if she has a lot of coughing. The worsening shortness of breath has been present since then.    Review of Systems negative unless stated in the HPI.   In the ED, she was placed on 6-8 L of oxygen and her oxygen improved. VBG obtained that showed normal pH at 7.42/pCO2  42/pO2 at 86. She was ambulated with 6 L but desaturated to 70s.   Past Medical History:     Past Medical History:  Diagnosis Date   Acute respiratory distress 03/27/2020   Elevated blood pressure reading 12/21/2018   GERD (gastroesophageal reflux disease)     Healthcare maintenance 06/28/2019    Per patient's recent discharge summary, FOBT positive in ED. Given her overall prognosis and health condition, will hold off discussing colonoscopy.   History of COVID-19 09/02/2019    05/03/19 - [place] - positive  Did not get mab  Did not get hosp   Preventative health care 01/15/2013   Pulmonary fibrosis (HCC)     Screening breast examination 11/26/2018          Meds:     Current Outpatient Medications  Medication Instructions   albuterol (VENTOLIN HFA) 108 (90 Base) MCG/ACT inhaler 2 puffs, Inhalation, Every 4 hours PRN   benzonatate (TESSALON) 100 mg, Oral, 3 times daily   guaiFENesin-dextromethorphan (ROBITUSSIN DM) 100-10 MG/5ML syrup  5 mLs, Oral, Every 4 hours PRN   pantoprazole (PROTONIX) 40 mg, Oral, Daily   phenol (CHLORASEPTIC) 1.4 % LIQD 1 spray, Mouth/Throat, As needed   predniSONE (DELTASONE) 40 mg, Oral, Daily with breakfast   sodium chloride (OCEAN) 0.65 % SOLN nasal spray 1 spray, Each Nare, As needed    Allergies:      Allergies as of 03/05/2023 - Review Complete 03/05/2023  Allergen Reaction Noted   Miralax [polyethylene glycol] Rash 07/09/2021    Past Surgical History:      Past Surgical History:  Procedure Laterality Date   CHOLECYSTECTOMY   05/2009   COLONOSCOPY WITH PROPOFOL N/A 01/08/2022    Procedure: COLONOSCOPY WITH PROPOFOL;  Surgeon: Vida Rigger, MD;  Location: [facility];  Service: Gastroenterology;  Laterality: N/A;   ESOPHAGOGASTRODUODENOSCOPY (EGD) WITH PROPOFOL N/A 01/08/2022    Procedure: ESOPHAGOGASTRODUODENOSCOPY (EGD) WITH PROPOFOL;  Surgeon: Vida Rigger, MD;  Location: [facility];  Service: Gastroenterology;  Laterality: N/A;   POLYPECTOMY    01/08/2022    Procedure: POLYPECTOMY;  Surgeon: Vida Rigger, MD;  Location: [facility];  Service: Gastroenterology;;          Family History:       Family History  Problem Relation Age of Onset   Diabetes Father     Hypertension Father     Asthma Sister     Diabetes Paternal Grandfather     Breast cancer Neg Hx            Social History:  Lives with husband. Has a pet dog.  Currently not working. 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  Tobacco- no tobacco use.  EtOH-Denies use.  Illicit drug use- denies use.  IADLs/ADLs- can person independently at baseline    Physical Exam: Blood pressure 117/78, pulse 86, temperature 98.6 F (37 C), temperature source Oral, resp. rate (!) 32, SpO2 94%. General: In slight distress, laying in bed HENT: NCAT, has [place] in place Lungs: crackles heard up to mid lungs Cardiovascular: NSR, good pulses in all extremities Abdomen: bowel sounds present, no TTP MSK: No asymmetry, good strength Skin: no lesions on skin Neuro: alert and oriented x4 Psych: normal mood   Diagnostics:       Latest Ref Rng & Units 03/05/2023   10:51 AM 03/05/2023   10:16 AM 02/27/2023    4:45 AM  CBC  WBC 4.0 - 10.5 K/uL   18.0  12.1   Hemoglobin 12.0 - 15.0 g/dL 65.7  84.6  96.2   Hematocrit 36.0 - 46.0 % 39.0  38.7  40.3   Platelets 150 - 400 K/uL   366  383         Latest Ref Rng & Units 03/05/2023   10:51 AM 03/05/2023   10:16 AM 02/27/2023    4:45 AM  CMP  Glucose 70 - 99 mg/dL   952  92   BUN 6 - 20 mg/dL   7  16   Creatinine 8.41 - 1.00 mg/dL   3.24  4.01   Sodium 027 - 145 mmol/L 140  138  133   Potassium 3.5 - 5.1 mmol/L 3.2  3.2  3.3   Chloride 98 - 111 mmol/L   101  97   CO2 22 - 32 mmol/L   26  27   Calcium 8.9 - 10.3 mg/dL   8.9  9.1   Total Protein 6.5 - 8.1 g/dL   7.0     Total Bilirubin 0.3 - 1.2 mg/dL  0.6     Alkaline Phos 38 - 126 U/L   69     AST 15 - 41 U/L   28     ALT 0 - 44 U/L   20       BNP 38.9   DG Chest 2 View    Result Date: 03/05/2023 CLINICAL DATA:  Shortness of breath. EXAM: CHEST - 2 VIEW COMPARISON:  Chest radiograph dated 02/26/2023. FINDINGS: Stable mild cardiomegaly. Low lung volumes. Bilateral chronic interstitial changes with slightly increased hazy airspace opacities in the left and right midlung zones. No pneumothorax or pleural effusion. No acute osseous abnormality. IMPRESSION: Slightly increased hazy airspace opacities in the bilateral midlung zones is suggestive of infection or pulmonary edema superimposed on chronic interstitial lung disease. Electronically Signed   By: Hart Robinsons M.D.   On: 03/05/2023 12:09      EKG: personally reviewed my interpretation is sinus rhythm with right axis deviation.    CXR: personally reviewed my interpretation is opacities in bilateral mid lung L>R   Assessment & Plan by Problem:   Present on Admission:  Acute on chronic hypoxic respiratory failure (HCC)   Acute on Chronic Respiratory Failure Pt was admitted previously with exacerbation of her ILD and was started on steroids. She appeared to be doing well with baseline 6 L oxygen since yesterday but worsened after a vomiting episode. Concern present for aspiration pneumonia vs pneumonitis as causing her acute worsening of pneumonia. She received a dose of Ceftriaxone and Azithromycin for CAP coverage but we will switch her to unasyn. She does not report fevers but states since yesterday has been having cold sweats. EKG is without signs of ischemia making ACS unlikely but will get troponins. She does have a leukocytosis which can be from an infection and from her steroid use. Will treat unasyn and observe pt to ensure she improves to baseline. Will continue current steroid treatment. If she is improved, will plan to switch her to augmentin. She will benefit from palliative care discussions this admission. Will check respiratory panel.    Hypokalemia K at 3.2. Will check magnesium and replete both.     GERD Chronic. Continue home PPI.    DVT prophx: Lovenox Diet: Regular Bowel: PRN Code: Full   Prior to Admission Living Arrangement: Home Anticipated Discharge Location: Home Barriers to Discharge: Medical Workup   Dispo: Admit patient to Observation with expected length of stay less than 2 midnights.   Gwenevere Abbot, MD Eligha Bridegroom. [facility] Internal Medicine Residency, PGY-3 Pager: [phone]

## 2023-03-05 NOTE — Progress Notes (Addendum)
Entered in error

## 2023-03-05 NOTE — ED Notes (Signed)
Assisted pt with bedside commode.

## 2023-03-05 NOTE — Progress Notes (Signed)
   03/05/23 1842  Assess: MEWS Score  BP 114/76  MAP (mmHg) 89  Pulse Rate (!) 114  Resp 19  SpO2 91 %  Assess: MEWS Score  MEWS Temp 0  MEWS Systolic 0  MEWS Pulse 2  MEWS RR 0  MEWS LOC 0  MEWS Score 2  MEWS Score Color Yellow  Assess: if the MEWS score is Yellow or Red  Were vital signs accurate and taken at a resting state? Yes  Does the patient meet 2 or more of the SIRS criteria? No  MEWS guidelines implemented  Yes, yellow  Treat  MEWS Interventions Considered administering scheduled or prn medications/treatments as ordered  Take Vital Signs  Increase Vital Sign Frequency  Yellow: Q2hr x1, continue Q4hrs until patient remains green for 12hrs  Escalate  MEWS: Escalate Yellow: Discuss with charge nurse and consider notifying provider and/or RRT  Notify: Charge Nurse/RN  Name of Charge Nurse/RN Notified Pension scheme manager  Provider Notification  Provider Name/Title Johny Sax  Date Provider Notified 03/05/23  Time Provider Notified 1929  Method of Notification Page  Notification Reason Other (Comment)  Assess: SIRS CRITERIA  SIRS Temperature  0  SIRS Pulse 1  SIRS Respirations  0  SIRS WBC 0  SIRS Score Sum  1

## 2023-03-05 NOTE — ED Triage Notes (Signed)
[facility] reports pt coming from home for SOB. Pt wears 6L [place] all the time. Pt has cystic fibrosis. Woke up this morning c/o sob and her oxygen was in the 70's on her oxygen.

## 2023-03-05 NOTE — ED Notes (Signed)
Called floor and they ok'd for patient to come upstiars.

## 2023-03-05 NOTE — Progress Notes (Signed)
Pharmacy Antibiotic Note  Olivia [name] is a 57 y.o. female admitted on 03/05/2023 with aspiration pneumonia.  Pharmacy has been consulted for Unasyn dosing.  Plan: Unasyn 3g IV q6h Follow up renal function, cultures as available, clinical progress, length of tx    Temp (24hrs), Avg:98.6 F (37 C), Min:98.4 F (36.9 C), Max:98.7 F (37.1 C)  Recent Labs  Lab 02/27/23 0445 03/05/23 1016  WBC 12.1* 18.0*  CREATININE 0.53 0.52    CrCl cannot be calculated (Unknown ideal weight.).    Allergies  Allergen Reactions   Miralax [Polyethylene Glycol] Rash    Antimicrobials this admission: 10/30 Ceftriaxone/Azithro x 1 10/30 Unasyn >>  Dose adjustments this admission:  Microbiology results:  Thank you for allowing pharmacy to be a part of this patient's care.  Loralee Pacas, PharmD, BCPS 03/05/2023 5:51 PM  Please check AMION for all [facility] Pharmacy phone numbers After 10:00 PM, call Main Pharmacy [phone]

## 2023-03-05 NOTE — Plan of Care (Signed)
Care plan  

## 2023-03-05 NOTE — ED Provider Notes (Signed)
[facility]ENC[facility] DEPARTMENT AT [facility] Provider Note   CSN: [id number] Arrival date & time: 03/05/23  1006     History  Chief Complaint  Patient presents with   Shortness of Breath   HPI Olivia [name] is a 57 y.o. female with pulmonary fibrosis on 6 L at home presenting for shortness of breath.  Patient states that she woke up this morning was short of breath stating that her oxygen was in the 70s at home.  States that if she was walking her oxygen was dropping the 70s but if she did sit still it would improve back to the 90s.  States she always has a cough.  Denies fever.  Denies chest pain.  States she is generally more tired than usual.   Shortness of Breath      Home Medications Prior to Admission medications   Medication Sig Start Date End Date Taking? Authorizing Provider  albuterol (VENTOLIN HFA) 108 (90 Base) MCG/ACT inhaler Inhale 2 puffs into the lungs every 4 (four) hours as needed for wheezing or shortness of breath. 02/27/23   Alexander-Savino, [place], MD  benzonatate (TESSALON) 100 MG capsule Take 1 capsule (100 mg total) by mouth 3 (three) times daily. 02/27/23   Alexander-Savino, [place], MD  gabapentin (NEURONTIN) 100 MG capsule Take 100 mg by mouth 3 (three) times daily.    [provider]  guaiFENesin-dextromethorphan (ROBITUSSIN DM) 100-10 MG/5ML syrup Take 5 mLs by mouth every 4 (four) hours as needed for cough. 02/27/23   Alexander-Savino, [place], MD  pantoprazole (PROTONIX) 40 MG tablet Take 1 tablet (40 mg total) by mouth daily. 02/28/23 03/30/23  Alexander-Savino, [place], MD  phenol (CHLORASEPTIC) 1.4 % LIQD Use as directed 1 spray in the mouth or throat as needed for throat irritation / pain. 02/27/23   Alexander-Savino, [place], MD  predniSONE (DELTASONE) 20 MG tablet Take 2 tablets (40 mg total) by mouth daily with breakfast. 02/27/23   Hassan Rowan, [place], MD  sodium chloride (OCEAN) 0.65 % SOLN nasal  spray Place 1 spray into both nostrils as needed (for dry nose). 02/27/23   [name], MD      Allergies    Miralax [polyethylene glycol]    Review of Systems   Review of Systems  Respiratory:  Positive for shortness of breath.     Physical Exam   Vitals:   03/05/23 1230 03/05/23 1300  BP: 115/86 117/78  Pulse: 95 86  Resp: (!) 31 (!) 32  Temp:    SpO2: 91% 94%    CONSTITUTIONAL:  well-appearing, NAD NEURO:  Alert and oriented x 3, CN 3-12 grossly intact EYES:  eyes equal and reactive ENT/NECK:  Supple, no stridor  CARDIO:  tachycardic  and regular rhythm, appears well-perfused  PULM: Tachypneic, hypoxic to high 70s.  On 8 L nasal cannula, after sitting down and and comfortable in the bed sats improved to the low 90s.  Crackles heard in the right upper and mid lung fields. GI/GU:  non-distended, soft MSK/SPINE:  No gross deformities, no edema, moves all extremities  SKIN:  no rash, atraumatic  *Additional and/or pertinent findings included in MDM below   ED Results / Procedures / Treatments   Labs (all labs ordered are listed, but only abnormal results are displayed) Labs Reviewed  CBC WITH DIFFERENTIAL/PLATELET - Abnormal; Notable for the following components:      Result Value   WBC 18.0 (*)    Neutro Abs 12.5 (*)    Lymphs Abs 4.1 (*)  All other components within normal limits  COMPREHENSIVE METABOLIC PANEL - Abnormal; Notable for the following components:   Potassium 3.2 (*)    Glucose, Bld 106 (*)    Albumin 3.1 (*)    All other components within normal limits  I-STAT VENOUS BLOOD GAS, ED - Abnormal; Notable for the following components:   pCO2, Ven 42.2 (*)    pO2, Ven 86 (*)    Acid-Base Excess 3.0 (*)    Potassium 3.2 (*)    All other components within normal limits  RESP PANEL BY RT-PCR (RSV, FLU A&B, COVID)  RVPGX2  BRAIN NATRIURETIC PEPTIDE  MAGNESIUM  TROPONIN I (HIGH SENSITIVITY)    EKG None  Radiology DG Chest 2  View  Result Date: 03/05/2023 CLINICAL DATA:  Shortness of breath. EXAM: CHEST - 2 VIEW COMPARISON:  Chest radiograph dated 02/26/2023. FINDINGS: Stable mild cardiomegaly. Low lung volumes. Bilateral chronic interstitial changes with slightly increased hazy airspace opacities in the left and right midlung zones. No pneumothorax or pleural effusion. No acute osseous abnormality. IMPRESSION: Slightly increased hazy airspace opacities in the bilateral midlung zones is suggestive of infection or pulmonary edema superimposed on chronic interstitial lung disease. Electronically Signed   By: Hart Robinsons M.D.   On: 03/05/2023 12:09    Procedures .Critical Care  Performed by: Gareth Eagle, PA-C Authorized by: Gareth Eagle, PA-C   Critical care provider statement:    Critical care time (minutes):  30   Critical care was necessary to treat or prevent imminent or life-threatening deterioration of the following conditions:  Respiratory failure   Critical care was time spent personally by me on the following activities:  Development of treatment plan with patient or surrogate, discussions with consultants, evaluation of patient's response to treatment, examination of patient, ordering and review of laboratory studies, ordering and review of radiographic studies, ordering and performing treatments and interventions, pulse oximetry, re-evaluation of patient's condition and review of old charts     Medications Ordered in ED Medications  azithromycin (ZITHROMAX) 500 mg in sodium chloride 0.9 % 250 mL IVPB (500 mg Intravenous New Bag/Given 03/05/23 1352)  enoxaparin (LOVENOX) injection 40 mg (has no administration in time range)  potassium chloride SA (KLOR-CON M) CR tablet 60 mEq (has no administration in time range)  senna-docusate (Senokot-S) tablet 1 tablet (has no administration in time range)  acetaminophen (TYLENOL) tablet 650 mg (has no administration in time range)    Or  acetaminophen  (TYLENOL) suppository 650 mg (has no administration in time range)  benzonatate (TESSALON) capsule 100 mg (has no administration in time range)  albuterol (VENTOLIN HFA) 108 (90 Base) MCG/ACT inhaler 2 puff (has no administration in time range)  guaiFENesin-dextromethorphan (ROBITUSSIN DM) 100-10 MG/5ML syrup 5 mL (has no administration in time range)  pantoprazole (PROTONIX) EC tablet 40 mg (has no administration in time range)  phenol (CHLORASEPTIC) mouth spray 1 spray (has no administration in time range)  predniSONE (DELTASONE) tablet 40 mg (has no administration in time range)  sodium chloride (OCEAN) 0.65 % nasal spray 1 spray (has no administration in time range)  gabapentin (NEURONTIN) capsule 100 mg (has no administration in time range)  cefTRIAXone (ROCEPHIN) 1 g in sodium chloride 0.9 % 100 mL IVPB (0 g Intravenous Stopped 03/05/23 1349)    ED Course/ Medical Decision Making/ A&P Clinical Course as of 03/05/23 1415  Wed Mar 05, 2023  1046 When I walked into the room, patient was ambulating from the bedside commode to  the bed.  She was on 8 L of oxygen at this time.  O2 sats were mid to low 70s.  Improved after about 2 minutes of sitting in the bed. [JR]  1258 X-ray revealed concern for bilateral midlung opacities suggestive of a new pneumonia. [JR]  1259 Ordered IV Rocephin and azithromycin.  [JR]    Clinical Course User Index [JR] Gareth Eagle, PA-C                                 Medical Decision Making Amount and/or Complexity of Data Reviewed Labs: ordered. Radiology: ordered.  Risk Decision regarding hospitalization.   Initial Impression and Ddx 57 year old well-appearing female presenting for dyspnea and hypoxia.  Initially tachypneic, hypoxic on 8 L.  DDx includes pneumonia, pneumothorax, PE, CHF exacerbation, hypoxia secondary to ILD, sepsis, other. Patient PMH that increases complexity of ED encounter:  pulmonary fibrosis  Interpretation of Diagnostics -  I independent reviewed and interpreted the labs as followed: leukocytosis mild hypokalemia  - I independently visualized the following imaging with scope of interpretation limited to determining acute life threatening conditions related to emergency care: CXR, which revealed Slightly increased hazy airspace opacities in the bilateral midlung zones is suggestive of infection or pulmonary edema superimposed on chronic interstitial lung disease  -I personally reviewed interpret EKG which revealed sinus rhythm  Patient Reassessment and Ultimate Disposition/Management On reassessment, patient was stable with sats in the low 90s on 6 L of high flow nasal cannula and somewhat tachypneic.  Attempted to walk patient around the department.  Sats quickly fell to mid 70s and patient was notably more tachypneic and short of breath.  X-ray did reveal concern for possible new pneumonia.  Started her on IV Rocephin and azithromycin.  However would consider other causes for hypoxia given that she is afebrile.  Leukocytosis could be explained by evolving pneumonia or recent steroid use.  Admitted to hospital service with Dr. Johny Sax.  Patient management required discussion with the following services or consulting groups:  Hospitalist Service  Complexity of Problems Addressed Acute complicated illness or Injury  Additional Data Reviewed and Analyzed Further history obtained from: Past medical history and medications listed in the EMR and Prior ED visit notes  Patient Encounter Risk Assessment Consideration of hospitalization         Final Clinical Impression(s) / ED Diagnoses Final diagnoses:  Hypoxia  SOB (shortness of breath)    Rx / DC Orders ED Discharge Orders     None         Gareth Eagle, PA-C 03/05/23 1415    Franne Forts, DO 03/06/23 [id number]

## 2023-03-05 NOTE — ED Notes (Signed)
Pt ambulated app 52ft on 6L [place]. Pt initial pulse ox was 92% and dropped to 79% while walking and on oxygen, pt became very tired and sob while ambulating, provider notified.

## 2023-03-05 NOTE — ED Notes (Signed)
ED TO INPATIENT HANDOFF REPORT  ED Nurse Name and Phone #: Delice Bison, RN  S Name/Age/Gender Olivia [name] 57 y.o. female Room/Bed: 023C/023C  Code Status   Code Status: Full Code  Home/SNF/Other Home Patient oriented to: self, place, time, and situation Is this baseline? Yes   Triage Complete: Triage complete  Chief Complaint Acute on chronic hypoxic respiratory failure (HCC) [J96.21]  Triage Note [facility] reports pt coming from home for SOB. Pt wears 6L [place] all the time. Pt has cystic fibrosis. Woke up this morning c/o sob and her oxygen was in the 70's on her oxygen.   Allergies Allergies  Allergen Reactions   Miralax [Polyethylene Glycol] Rash    Level of Care/Admitting Diagnosis ED Disposition     ED Disposition  Admit   Condition  --   Comment  Hospital Area: [facility] [[id number]]  Level of Care: Telemetry Medical [104]  May place patient in observation at [facility] or [facility] if equivalent level of care is available:: No  Covid Evaluation: Symptomatic Person Under Investigation (PUI) or recent exposure (last 10 days) *Testing Required*  Diagnosis: Acute on chronic hypoxic respiratory failure [facility]) [[id number]]  Admitting Physician: Gwenevere Abbot [[id number]]  Attending Physician: Ninetta Lights, JEFFREY C [[id number]]          B Medical/Surgery History Past Medical History:  Diagnosis Date   Acute respiratory distress 03/27/2020   Elevated blood pressure reading 12/21/2018   GERD (gastroesophageal reflux disease)    Healthcare maintenance 06/28/2019   Per patient's recent discharge summary, FOBT positive in ED. Given her overall prognosis and health condition, will hold off discussing colonoscopy.   History of COVID-19 09/02/2019   05/03/19 - [place] - positive  Did not get mab  Did not get hosp   Preventative health care 01/15/2013   Pulmonary fibrosis (HCC)    Screening breast examination 11/26/2018   Past Surgical History:  Procedure  Laterality Date   CHOLECYSTECTOMY  05/2009   COLONOSCOPY WITH PROPOFOL N/A 01/08/2022   Procedure: COLONOSCOPY WITH PROPOFOL;  Surgeon: Vida Rigger, MD;  Location: [facility];  Service: Gastroenterology;  Laterality: N/A;   ESOPHAGOGASTRODUODENOSCOPY (EGD) WITH PROPOFOL N/A 01/08/2022   Procedure: ESOPHAGOGASTRODUODENOSCOPY (EGD) WITH PROPOFOL;  Surgeon: Vida Rigger, MD;  Location: [facility];  Service: Gastroenterology;  Laterality: N/A;   POLYPECTOMY  01/08/2022   Procedure: POLYPECTOMY;  Surgeon: Vida Rigger, MD;  Location: [facility];  Service: Gastroenterology;;     A IV Location/Drains/Wounds Patient Lines/Drains/Airways Status     Active Line/Drains/Airways     Name Placement date Placement time Site Days   Peripheral IV 03/05/23 20 G Anterior;Left Forearm 03/05/23  1008  Forearm  less than 1            Intake/Output Last 24 hours  Intake/Output Summary (Last 24 hours) at 03/05/2023 1431 Last data filed at 03/05/2023 1029 Gross per 24 hour  Intake --  Output 500 ml  Net -500 ml    Labs/Imaging Results for orders placed or performed during the hospital encounter of 03/05/23 (from the past 48 hour(s))  CBC with Differential     Status: Abnormal   Collection Time: 03/05/23 10:16 AM  Result Value Ref Range   WBC 18.0 (H) 4.0 - 10.5 K/uL   RBC 4.19 3.87 - 5.11 MIL/uL   Hemoglobin 13.0 12.0 - 15.0 g/dL   HCT 27.2 53.6 - 64.4 %   MCV 92.4 80.0 - 100.0 fL   MCH 31.0 26.0 - 34.0 pg  MCHC 33.6 30.0 - 36.0 g/dL   RDW 14.7 82.9 - 56.2 %   Platelets 366 150 - 400 K/uL   nRBC 0.0 0.0 - 0.2 %   Neutrophils Relative % 71 %   Neutro Abs 12.5 (H) 1.7 - 7.7 K/uL   Lymphocytes Relative 23 %   Lymphs Abs 4.1 (H) 0.7 - 4.0 K/uL   Monocytes Relative 4 %   Monocytes Absolute 0.8 0.1 - 1.0 K/uL   Eosinophils Relative 2 %   Eosinophils Absolute 0.4 0.0 - 0.5 K/uL   Basophils Relative 0 %   Basophils Absolute 0.1 0.0 - 0.1 K/uL   Immature Granulocytes 0 %   Abs Immature  Granulocytes 0.07 0.00 - 0.07 K/uL    Comment: Performed at [facility], [address] [place], [place] [address]  Brain natriuretic peptide     Status: None   Collection Time: 03/05/23 10:16 AM  Result Value Ref Range   B Natriuretic Peptide 38.6 0.0 - 100.0 pg/mL    Comment: Performed at [facility], [address], [place], [place] [address]  Comprehensive metabolic panel     Status: Abnormal   Collection Time: 03/05/23 10:16 AM  Result Value Ref Range   Sodium 138 135 - 145 mmol/L   Potassium 3.2 (L) 3.5 - 5.1 mmol/L   Chloride 101 98 - 111 mmol/L   CO2 26 22 - 32 mmol/L   Glucose, Bld 106 (H) 70 - 99 mg/dL    Comment: Glucose reference range applies only to samples taken after fasting for at least 8 hours.   BUN 7 6 - 20 mg/dL   Creatinine, Ser 9.62 0.44 - 1.00 mg/dL   Calcium 8.9 8.9 - 95.2 mg/dL   Total Protein 7.0 6.5 - 8.1 g/dL   Albumin 3.1 (L) 3.5 - 5.0 g/dL   AST 28 15 - 41 U/L   ALT 20 0 - 44 U/L   Alkaline Phosphatase 69 38 - 126 U/L   Total Bilirubin 0.6 0.3 - 1.2 mg/dL   GFR, Estimated >84 >13 mL/min    Comment: (NOTE) Calculated using the CKD-EPI Creatinine Equation (2021)    Anion gap 11 5 - 15    Comment: Performed at [facility], [address] [place], [place] [address]  I-Stat venous blood gas, [facility], [facility], [facility])     Status: Abnormal   Collection Time: 03/05/23 10:51 AM  Result Value Ref Range   pH, Ven 7.421 7.25 - 7.43   pCO2, Ven 42.2 (L) 44 - 60 mmHg   pO2, Ven 86 (H) 32 - 45 mmHg   Bicarbonate 27.4 20.0 - 28.0 mmol/L   TCO2 29 22 - 32 mmol/L   O2 Saturation 97 %   Acid-Base Excess 3.0 (H) 0.0 - 2.0 mmol/L   Sodium 140 135 - 145 mmol/L   Potassium 3.2 (L) 3.5 - 5.1 mmol/L   Calcium, Ion 1.20 1.15 - 1.40 mmol/L   HCT 39.0 36.0 - 46.0 %   Hemoglobin 13.3 12.0 - 15.0 g/dL   Sample type VENOUS    DG Chest 2 View  Result Date: 03/05/2023 CLINICAL DATA:  Shortness of breath. EXAM: CHEST - 2 VIEW COMPARISON:  Chest  radiograph dated 02/26/2023. FINDINGS: Stable mild cardiomegaly. Low lung volumes. Bilateral chronic interstitial changes with slightly increased hazy airspace opacities in the left and right midlung zones. No pneumothorax or pleural effusion. No acute osseous abnormality. IMPRESSION: Slightly increased hazy airspace opacities in  the bilateral midlung zones is suggestive of infection or pulmonary edema superimposed on chronic interstitial lung disease. Electronically Signed   By: Hart Robinsons M.D.   On: 03/05/2023 12:09    Pending Labs Unresulted Labs (From admission, onward)     Start     Ordered   03/06/23 0500  CBC  Tomorrow morning,   R        03/05/23 1409   03/06/23 0500  Basic metabolic panel  Tomorrow morning,   R        03/05/23 1409   03/05/23 1410  Magnesium  Add-on,   AD        03/05/23 1409   03/05/23 1400  Resp panel by RT-PCR (RSV, Flu A&B, Covid) Anterior Nasal Swab  Once,   URGENT        03/05/23 1359            Vitals/Pain Today's Vitals   03/05/23 1030 03/05/23 1205 03/05/23 1230 03/05/23 1300  BP: 106/76 102/75 115/86 117/78  Pulse: 90 72 95 86  Resp: (!) 38 (!) 40 (!) 31 (!) 32  Temp:      TempSrc:      SpO2: 94% 94% 91% 94%  PainSc:        Isolation Precautions No active isolations  Medications Medications  azithromycin (ZITHROMAX) 500 mg in sodium chloride 0.9 % 250 mL IVPB (500 mg Intravenous New Bag/Given 03/05/23 1352)  enoxaparin (LOVENOX) injection 40 mg (has no administration in time range)  potassium chloride SA (KLOR-CON M) CR tablet 60 mEq (has no administration in time range)  senna-docusate (Senokot-S) tablet 1 tablet (has no administration in time range)  acetaminophen (TYLENOL) tablet 650 mg (has no administration in time range)    Or  acetaminophen (TYLENOL) suppository 650 mg (has no administration in time range)  benzonatate (TESSALON) capsule 100 mg (has no administration in time range)  albuterol (VENTOLIN HFA) 108 (90 Base)  MCG/ACT inhaler 2 puff (has no administration in time range)  guaiFENesin-dextromethorphan (ROBITUSSIN DM) 100-10 MG/5ML syrup 5 mL (has no administration in time range)  pantoprazole (PROTONIX) EC tablet 40 mg (has no administration in time range)  phenol (CHLORASEPTIC) mouth spray 1 spray (has no administration in time range)  predniSONE (DELTASONE) tablet 40 mg (has no administration in time range)  sodium chloride (OCEAN) 0.65 % nasal spray 1 spray (has no administration in time range)  gabapentin (NEURONTIN) capsule 100 mg (has no administration in time range)  cefTRIAXone (ROCEPHIN) 1 g in sodium chloride 0.9 % 100 mL IVPB (0 g Intravenous Stopped 03/05/23 1349)    Mobility walks     Focused Assessments Pulmonary Assessment Handoff:  Lung sounds:   O2 Device: Nasal Cannula O2 Flow Rate (L/min): 6 L/min    R Recommendations: See Admitting Provider Note  Report given to:   Additional Notes:  Family at bedside, speaks english

## 2023-03-05 NOTE — Progress Notes (Signed)
   03/05/23 1607  Assess: MEWS Score  Temp 98.4 F (36.9 C)  BP 95/61  MAP (mmHg) 73  Pulse Rate (!) 110  Resp 20  SpO2 94 %  Assess: MEWS Score  MEWS Temp 0  MEWS Systolic 1  MEWS Pulse 1  MEWS RR 0  MEWS LOC 0  MEWS Score 2  MEWS Score Color Yellow  Assess: if the MEWS score is Yellow or Red  Were vital signs accurate and taken at a resting state? Yes  Does the patient meet 2 or more of the SIRS criteria? No  MEWS guidelines implemented  Yes, yellow  Treat  MEWS Interventions Considered administering scheduled or prn medications/treatments as ordered  Take Vital Signs  Increase Vital Sign Frequency  Yellow: Q2hr x1, continue Q4hrs until patient remains green for 12hrs  Escalate  MEWS: Escalate Yellow: Discuss with charge nurse and consider notifying provider and/or RRT  Notify: Charge Nurse/RN  Name of Charge Nurse/RN Notified Vonna Kotyk, RN  Provider Notification  Provider Name/Title Anice Paganini  Date Provider Notified 03/05/23  Time Provider Notified 1702  Method of Notification Page  Notification Reason Change in status  Provider response Other (Comment)  Date of Provider Response 03/05/23  Time of Provider Response 1708  Assess: SIRS CRITERIA  SIRS Temperature  0  SIRS Pulse 1  SIRS Respirations  0  SIRS WBC 0  SIRS Score Sum  1

## 2023-03-06 DIAGNOSIS — R64 Cachexia: Secondary | ICD-10-CM | POA: Diagnosis present

## 2023-03-06 DIAGNOSIS — J690 Pneumonitis due to inhalation of food and vomit: Secondary | ICD-10-CM | POA: Diagnosis present

## 2023-03-06 DIAGNOSIS — Z515 Encounter for palliative care: Secondary | ICD-10-CM

## 2023-03-06 DIAGNOSIS — Z5986 Financial insecurity: Secondary | ICD-10-CM | POA: Diagnosis not present

## 2023-03-06 DIAGNOSIS — K5900 Constipation, unspecified: Secondary | ICD-10-CM

## 2023-03-06 DIAGNOSIS — Z833 Family history of diabetes mellitus: Secondary | ICD-10-CM | POA: Diagnosis not present

## 2023-03-06 DIAGNOSIS — K219 Gastro-esophageal reflux disease without esophagitis: Secondary | ICD-10-CM | POA: Diagnosis present

## 2023-03-06 DIAGNOSIS — R0602 Shortness of breath: Secondary | ICD-10-CM | POA: Diagnosis present

## 2023-03-06 DIAGNOSIS — Z555 Less than a high school diploma: Secondary | ICD-10-CM | POA: Diagnosis not present

## 2023-03-06 DIAGNOSIS — Z603 Acculturation difficulty: Secondary | ICD-10-CM | POA: Diagnosis present

## 2023-03-06 DIAGNOSIS — I739 Peripheral vascular disease, unspecified: Secondary | ICD-10-CM | POA: Diagnosis present

## 2023-03-06 DIAGNOSIS — Z9049 Acquired absence of other specified parts of digestive tract: Secondary | ICD-10-CM | POA: Diagnosis not present

## 2023-03-06 DIAGNOSIS — Z7189 Other specified counseling: Secondary | ICD-10-CM

## 2023-03-06 DIAGNOSIS — J8410 Pulmonary fibrosis, unspecified: Secondary | ICD-10-CM

## 2023-03-06 DIAGNOSIS — Z7952 Long term (current) use of systemic steroids: Secondary | ICD-10-CM | POA: Diagnosis not present

## 2023-03-06 DIAGNOSIS — Z888 Allergy status to other drugs, medicaments and biological substances status: Secondary | ICD-10-CM | POA: Diagnosis not present

## 2023-03-06 DIAGNOSIS — Z825 Family history of asthma and other chronic lower respiratory diseases: Secondary | ICD-10-CM | POA: Diagnosis not present

## 2023-03-06 DIAGNOSIS — K449 Diaphragmatic hernia without obstruction or gangrene: Secondary | ICD-10-CM | POA: Diagnosis present

## 2023-03-06 DIAGNOSIS — Z8616 Personal history of COVID-19: Secondary | ICD-10-CM | POA: Diagnosis not present

## 2023-03-06 DIAGNOSIS — J8489 Other specified interstitial pulmonary diseases: Secondary | ICD-10-CM | POA: Diagnosis present

## 2023-03-06 DIAGNOSIS — E876 Hypokalemia: Secondary | ICD-10-CM | POA: Diagnosis present

## 2023-03-06 DIAGNOSIS — Z8249 Family history of ischemic heart disease and other diseases of the circulatory system: Secondary | ICD-10-CM | POA: Diagnosis not present

## 2023-03-06 DIAGNOSIS — J9621 Acute and chronic respiratory failure with hypoxia: Secondary | ICD-10-CM | POA: Diagnosis present

## 2023-03-06 DIAGNOSIS — Z79899 Other long term (current) drug therapy: Secondary | ICD-10-CM | POA: Diagnosis not present

## 2023-03-06 DIAGNOSIS — M549 Dorsalgia, unspecified: Secondary | ICD-10-CM | POA: Diagnosis present

## 2023-03-06 LAB — RAPID URINE DRUG SCREEN, HOSP PERFORMED
Amphetamines: NOT DETECTED
Barbiturates: NOT DETECTED
Benzodiazepines: NOT DETECTED
Cocaine: NOT DETECTED
Opiates: NOT DETECTED
Tetrahydrocannabinol: NOT DETECTED

## 2023-03-06 LAB — CBC
HCT: 37.7 % (ref 36.0–46.0)
Hemoglobin: 12.4 g/dL (ref 12.0–15.0)
MCH: 30.2 pg (ref 26.0–34.0)
MCHC: 32.9 g/dL (ref 30.0–36.0)
MCV: 92 fL (ref 80.0–100.0)
Platelets: 396 10*3/uL (ref 150–400)
RBC: 4.1 MIL/uL (ref 3.87–5.11)
RDW: 12.1 % (ref 11.5–15.5)
WBC: 16.7 10*3/uL — ABNORMAL HIGH (ref 4.0–10.5)
nRBC: 0 % (ref 0.0–0.2)

## 2023-03-06 LAB — BASIC METABOLIC PANEL
Anion gap: 8 (ref 5–15)
BUN: 14 mg/dL (ref 6–20)
CO2: 25 mmol/L (ref 22–32)
Calcium: 9.3 mg/dL (ref 8.9–10.3)
Chloride: 104 mmol/L (ref 98–111)
Creatinine, Ser: 0.56 mg/dL (ref 0.44–1.00)
GFR, Estimated: >60 mL/min (ref >60)
Glucose, Bld: 130 mg/dL — ABNORMAL HIGH (ref 70–99)
Potassium: 4 mmol/L (ref 3.5–5.1)
Sodium: 137 mmol/L (ref 135–145)

## 2023-03-06 MED ORDER — RIVAROXABAN 10 MG PO TABS
10.0000 mg | ORAL_TABLET | Freq: Every day | ORAL | Status: DC
  Administered 2023-03-06 – 2023-03-10 (×5): 10 mg via ORAL
  Filled 2023-03-06 (×5): qty 1

## 2023-03-06 MED ORDER — GABAPENTIN 300 MG PO CAPS
300.0000 mg | ORAL_CAPSULE | Freq: Two times a day (BID) | ORAL | Status: DC
  Administered 2023-03-06 – 2023-03-10 (×9): 300 mg via ORAL
  Filled 2023-03-06 (×9): qty 1

## 2023-03-06 MED ORDER — PREDNISONE 20 MG PO TABS
40.0000 mg | ORAL_TABLET | Freq: Every day | ORAL | Status: DC
  Administered 2023-03-07 – 2023-03-10 (×4): 40 mg via ORAL
  Filled 2023-03-06 (×4): qty 2

## 2023-03-06 MED ORDER — AMOXICILLIN-POT CLAVULANATE 875-125 MG PO TABS: 1.0000 | ORAL_TABLET | Freq: Two times a day (BID) | ORAL | Status: DC

## 2023-03-06 MED ORDER — GABAPENTIN 300 MG PO CAPS: 300.0000 mg | ORAL_CAPSULE | Freq: Three times a day (TID) | ORAL | Status: DC

## 2023-03-06 MED ORDER — PREDNISONE 20 MG PO TABS: 40.0000 mg | ORAL_TABLET | Freq: Every day | ORAL | Status: DC

## 2023-03-06 MED ORDER — AMOXICILLIN-POT CLAVULANATE 875-125 MG PO TABS
1.0000 | ORAL_TABLET | Freq: Two times a day (BID) | ORAL | Status: DC
  Administered 2023-03-06 – 2023-03-10 (×8): 1 via ORAL
  Filled 2023-03-06 (×9): qty 1

## 2023-03-06 MED ORDER — SENNOSIDES-DOCUSATE SODIUM 8.6-50 MG PO TABS
1.0000 | ORAL_TABLET | Freq: Every day | ORAL | Status: DC
  Administered 2023-03-06 – 2023-03-09 (×4): 1 via ORAL
  Filled 2023-03-06 (×4): qty 1

## 2023-03-06 MED ORDER — SULFAMETHOXAZOLE-TRIMETHOPRIM 400-80 MG PO TABS
1.0000 | ORAL_TABLET | ORAL | Status: DC
  Administered 2023-03-07 – 2023-03-10 (×2): 1 via ORAL
  Filled 2023-03-06 (×2): qty 1

## 2023-03-06 NOTE — Evaluation (Signed)
Physical Therapy Evaluation Patient Details Name: Olivia [name] MRN: [id number] DOB: [birth year] Today's Date: 03/06/2023  History of Present Illness  Pt is a 57 y/o F admitted on 03/05/23 after presenting with c/o O2 desaturations while ambulating, actively worsening overnight. Pt is being treated for acute on chronic respiratory failure.  PMH: ILD, GERD, hiatal hernia, recent admission for acute on chronic respiratory failure 2/2 ILD  Clinical Impression  Pt seen for PT evaluation with pt agreeable to tx. Pt reports prior to admission she was living with her spouse in a 1 level home with 1 small step to enter. Pt reports her mobility has been limited 2/2 increased O2 needs. Pt originally on 2L/min but recently d/c home on 6L/min, currently on 5L/min during session. Pt is able to complete bed mobility & STS with mod I, step pivot bed>recliner without AD & supervision. Pt performs marching in place x 1 minute x 2 trials with seated rest break between. Pt requires ~3 minutes to recover SpO2 after each marching trial. SpO2 drops as low as 77% on 2nd trial, recovers to 90%. Will continue to follow pt acutely to progress mobility as able. Pt may benefit from rollator trial during gait attempts.    If plan is discharge home, recommend the following: Assistance with cooking/housework;Assist for transportation;Help with stairs or ramp for entrance   Can travel by private vehicle        Equipment Recommendations Rollator (4 wheels)  Recommendations for Other Services       Functional Status Assessment Patient has had a recent decline in their functional status and demonstrates the ability to make significant improvements in function in a reasonable and predictable amount of time.     Precautions / Restrictions Precautions Precautions: None Precaution Comments: monitor O2 Restrictions Weight Bearing Restrictions: No      Mobility  Bed Mobility Overal bed mobility: Modified  Independent Bed Mobility: Supine to Sit     Supine to sit: Modified independent (Device/Increase time), HOB elevated, Used rails          Transfers Overall transfer level: Needs assistance Equipment used: None Transfers: Sit to/from Stand, Bed to chair/wheelchair/BSC Sit to Stand: Modified independent (Device/Increase time)   Step pivot transfers: Supervision       General transfer comment: STS from EOB, recliner.    Ambulation/Gait                  Stairs            Wheelchair Mobility     Tilt Bed    Modified Rankin (Stroke Patients Only)       Balance Overall balance assessment: Mild deficits observed, not formally tested   Sitting balance-Leahy Scale: Good     Standing balance support: No upper extremity supported, During functional activity Standing balance-Leahy Scale: Good                               Pertinent Vitals/Pain Pain Assessment Pain Assessment: Faces Faces Pain Scale: Hurts a little bit Pain Location: back Pain Descriptors / Indicators: Discomfort Pain Intervention(s): Monitored during session    Home Living Family/patient expects to be discharged to:: Private residence Living Arrangements: Spouse/significant other Available Help at Discharge: Family;Available PRN/intermittently (spouse works during the day) Type of Home: House Home Access: Stairs to enter   [employer] of Steps: 1 small step   Home Layout: One level Home Equipment: None Additional Comments: was  using 2L O2, but recently d/c home on 6L/min    Prior Function               Mobility Comments: Ambulatory without AD, decreased activity tolerance 2/2 increased O2 needs, denies falls       Extremity/Trunk Assessment   Upper Extremity Assessment Upper Extremity Assessment: Overall WFL for tasks assessed    Lower Extremity Assessment Lower Extremity Assessment: Overall WFL for tasks assessed    Cervical / Trunk  Assessment Cervical / Trunk Assessment: Normal  Communication   Communication Communication: Other (comment) (english speaking, utilized iPad interpreter Lafonda Mosses [phone]))  Cognition Arousal: Alert Behavior During Therapy: WFL for tasks assessed/performed Overall Cognitive Status: Within Functional Limits for tasks assessed                                          General Comments General comments (skin integrity, edema, etc.): Pt on 5L/min via nasal cannula throughout session.    Exercises     Assessment/Plan    PT Assessment Patient needs continued PT services  PT Problem List Cardiopulmonary status limiting activity;Decreased activity tolerance;Decreased balance;Decreased mobility;Decreased knowledge of use of DME       PT Treatment Interventions DME instruction;Balance training;Gait training;Stair training;Functional mobility training;Therapeutic activities;Therapeutic exercise;Patient/family education;Neuromuscular re-education    PT Goals (Current goals can be found in the Care Plan section)  Acute Rehab PT Goals Patient Stated Goal: get better PT Goal Formulation: With patient Time For Goal Achievement: 03/14/23 Potential to Achieve Goals: Fair    Frequency Min 1X/week     Co-evaluation               AM-PAC PT "6 Clicks" Mobility  Outcome Measure Help needed turning from your back to your side while in a flat bed without using bedrails?: None Help needed moving from lying on your back to sitting on the side of a flat bed without using bedrails?: None Help needed moving to and from a bed to a chair (including a wheelchair)?: A Little Help needed standing up from a chair using your arms (e.g., wheelchair or bedside chair)?: None Help needed to walk in hospital room?: A Little Help needed climbing 3-5 steps with a railing? : A Little 6 Click Score: 21    End of Session Equipment Utilized During Treatment: Oxygen Activity Tolerance: Patient  tolerated treatment well Patient left: in chair;with call bell/phone within reach   PT Visit Diagnosis: Other abnormalities of gait and mobility (R26.89)    Time: 0454-0981 PT Time Calculation (min) (ACUTE ONLY): 24 min   Charges:   PT Evaluation $PT Eval Low Complexity: 1 Low   PT General Charges $$ ACUTE PT VISIT: 1 Visit         Aleda Grana, PT, DPT 03/06/23, 12:01 PM   Sandi Mariscal 03/06/2023, 11:59 AM

## 2023-03-06 NOTE — Consult Note (Signed)
NAME:  Olivia [name], MRN:  [id number], DOB:  Jan 28, 1966, LOS: 0 ADMISSION DATE:  03/05/2023, CONSULTATION DATE: 03/06/2023 REFERRING MD: Teaching service, CHIEF COMPLAINT: Acute on chronic hypoxic respiratory failure in setting of pulmonary fibrosis  History of Present Illness:  57 year old Hispanic female well-known to the practice who was just discharged 02/28/2023 after being treated for flare of interstitial lung disease pulmonary fibrosis and was discharged on 40 mg daily of prednisone.  She was also on 6 L nasal cannula 24/7.  She returns with increasing shortness of breath.  Denies fever chills or sweats or purulent sputum.  Currently she is awake and alert no acute distress. I suspect she becomes more active at home which may be leading to shortness of breath. She does have a dog this may be a trigger.  Pertinent  Medical History   Past Medical History:  Diagnosis Date   Acute respiratory distress 03/27/2020   Elevated blood pressure reading 12/21/2018   GERD (gastroesophageal reflux disease)    Healthcare maintenance 06/28/2019   Per patient's recent discharge summary, FOBT positive in ED. Given her overall prognosis and health condition, will hold off discussing colonoscopy.   History of COVID-19 09/02/2019   05/03/19 - [place] - positive  Did not get mab  Did not get hosp   Preventative health care 01/15/2013   Pulmonary fibrosis (HCC)    Screening breast examination 11/26/2018     Significant Hospital Events: Including procedures, antibiotic start and stop dates in addition to other pertinent events     Interim History / Subjective:  Increasing shortness of breath over 2 days was readmitted  Objective   Blood pressure 100/68, pulse (!) 109, temperature 97.6 F (36.4 C), resp. rate 20, SpO2 95%.        Intake/Output Summary (Last 24 hours) at 03/06/2023 1033 Last data filed at 03/06/2023 0900 Gross per 24 hour  Intake 594.41 ml  Output 0 ml  Net 594.41  ml   There were no vitals filed for this visit.  Examination: General: Well-nourished well-developed female in no acute distress at rest HENT: No JVD or lymphadenopathy is appreciated Lungs: Decreased breath movement throughout no wheezing or rhonchi is appreciated Cardiovascular: Heart sounds are regular regular rate rhythm Abdomen: Soft nontender Extremities: Mild edema Neuro: Grossly intact without focal defect GU: Voids  Resolved Hospital Problem list     Assessment & Plan:  Acute on chronic hypoxia in the setting of pulmonary fibrosis interstitial lung disease steroid responsive.  She is normally on 6 L nasal cannula, just discharged approximately 5 days ago on 40 mg of prednisone along with proton pump inhibitor, 6 L nasal cannula and returns for increasing shortness of breath and hypoxia without any symptoms of fevers chills sweats or infectious process.  She reports compliance with her medication she may have been more active than normal. Agree with current interventions Agree with antibiotics Add Bactrim She has not had a follow-up with pulmonary as of yet as she cannot stay out of the hospital long enough. Continue prednisone 40 mg daily Continue Protonix 40 mg Follow-up appointment with pulmonary  History of peripheral vascular disease Currently on anticoagulation  Best Practice (right click and "Reselect all SmartList Selections" daily)   Diet/type: Regular consistency (see orders) DVT prophylaxis: DOAC GI prophylaxis: N/Appi Lines: N/A Foley:  N/A Code Status:  full code Last date of multidisciplinary goals of care discussion [tbd] 2 friends at bedside acting as interpreter Labs   CBC: Recent Labs  Lab  03/05/23 1016 03/05/23 1051 03/06/23 0436  WBC 18.0*  --  16.7*  NEUTROABS 12.5*  --   --   HGB 13.0 13.3 12.4  HCT 38.7 39.0 37.7  MCV 92.4  --  92.0  PLT 366  --  396    Basic Metabolic Panel: Recent Labs  Lab 03/05/23 1016 03/05/23 1051  03/05/23 1416 03/06/23 0436  NA 138 140  --  137  K 3.2* 3.2*  --  4.0  CL 101  --   --  104  CO2 26  --   --  25  GLUCOSE 106*  --   --  130*  BUN 7  --   --  14  CREATININE 0.52  --   --  0.56  CALCIUM 8.9  --   --  9.3  MG  --   --  2.2  --    GFR: CrCl cannot be calculated (Unknown ideal weight.). Recent Labs  Lab 03/05/23 1016 03/06/23 0436  WBC 18.0* 16.7*    Liver Function Tests: Recent Labs  Lab 03/05/23 1016  AST 28  ALT 20  ALKPHOS 69  BILITOT 0.6  PROT 7.0  ALBUMIN 3.1*   No results for input(s): "LIPASE", "AMYLASE" in the last 168 hours. No results for input(s): "AMMONIA" in the last 168 hours.  ABG    Component Value Date/Time   HCO3 27.4 03/05/2023 1051   TCO2 29 03/05/2023 1051   O2SAT 97 03/05/2023 1051     Coagulation Profile: No results for input(s): "INR", "PROTIME" in the last 168 hours.  Cardiac Enzymes: No results for input(s): "CKTOTAL", "CKMB", "CKMBINDEX", "TROPONINI" in the last 168 hours.  HbA1C: Hgb A1c MFr Bld  Date/Time Value Ref Range Status  07/08/2021 01:31 AM 5.2 4.8 - 5.6 % Final    Comment:    (NOTE) Pre diabetes:          5.7%-6.4%  Diabetes:              >6.4%  Glycemic control for   <7.0% adults with diabetes   03/28/2020 02:07 AM 5.4 4.8 - 5.6 % Final    Comment:    (NOTE) Pre diabetes:          5.7%-6.4%  Diabetes:              >6.4%  Glycemic control for   <7.0% adults with diabetes     CBG: No results for input(s): "GLUCAP" in the last 168 hours.  Review of Systems:   10 point review of system taken, please see HPI for positives and negatives. Denies fever chills and sweats She does have a dog for the last 2 years inCreasing shortness of breath over the last 24 hours  Past Medical History:  She,  has a past medical history of Acute respiratory distress (03/27/2020), Elevated blood pressure reading (12/21/2018), GERD (gastroesophageal reflux disease), Healthcare maintenance (06/28/2019),  History of COVID-19 (09/02/2019), Preventative health care (01/15/2013), Pulmonary fibrosis (HCC), and Screening breast examination (11/26/2018).   Surgical History:   Past Surgical History:  Procedure Laterality Date   CHOLECYSTECTOMY  05/2009   COLONOSCOPY WITH PROPOFOL N/A 01/08/2022   Procedure: COLONOSCOPY WITH PROPOFOL;  Surgeon: Vida Rigger, MD;  Location: [facility];  Service: Gastroenterology;  Laterality: N/A;   ESOPHAGOGASTRODUODENOSCOPY (EGD) WITH PROPOFOL N/A 01/08/2022   Procedure: ESOPHAGOGASTRODUODENOSCOPY (EGD) WITH PROPOFOL;  Surgeon: Vida Rigger, MD;  Location: [facility];  Service: Gastroenterology;  Laterality: N/A;   POLYPECTOMY  01/08/2022   Procedure:  POLYPECTOMY;  Surgeon: Vida Rigger, MD;  Location: [facility];  Service: Gastroenterology;;     Social History:   reports that she has never smoked. She has never been exposed to tobacco smoke. She has never used smokeless tobacco. She reports that she does not currently use alcohol. She reports that she does not use drugs.   Family History:  Her family history includes Asthma in her sister; Diabetes in her father and paternal grandfather; Hypertension in her father. There is no history of Breast cancer.   Allergies Allergies  Allergen Reactions   Miralax [Polyethylene Glycol] Rash     Home Medications  Prior to Admission medications   Medication Sig Start Date End Date Taking? Authorizing Provider  albuterol (VENTOLIN HFA) 108 (90 Base) MCG/ACT inhaler Inhale 2 puffs into the lungs every 4 (four) hours as needed for wheezing or shortness of breath. 02/27/23  Yes Alexander-Savino, [place], MD  benzonatate (TESSALON) 100 MG capsule Take 1 capsule (100 mg total) by mouth 3 (three) times daily. 02/27/23  Yes Alexander-Savino, [place], MD  guaiFENesin-dextromethorphan (ROBITUSSIN DM) 100-10 MG/5ML syrup Take 5 mLs by mouth every 4 (four) hours as needed for cough. 02/27/23  Yes Alexander-Savino, [place], MD   pantoprazole (PROTONIX) 40 MG tablet Take 1 tablet (40 mg total) by mouth daily. 02/28/23 03/30/23 Yes Alexander-Savino, [place], MD  predniSONE (DELTASONE) 20 MG tablet Take 2 tablets (40 mg total) by mouth daily with breakfast. 02/27/23  Yes Alexander-Savino, [place], MD  sodium chloride (OCEAN) 0.65 % SOLN nasal spray Place 1 spray into both nostrils as needed (for dry nose). 02/27/23  Yes [name], MD     Critical care time: Elizebeth Brooking Gearld Kerstein ACNP Acute Care Nurse Practitioner [facility]/Critical Care Please consult Amion 03/06/2023, 10:34 AM

## 2023-03-06 NOTE — Progress Notes (Signed)
HD#0 SUBJECTIVE:  Patient Summary: Olivia [name] is a 57 y.o. with a pertinent PMH of ILD, GERD, hiatal hernia, and recent admission for acute on chronic respiratory failure 2/2 to ILD readmitted due to desaturations while ambulating, acutely worsening overnight.   Overnight Events: NAEO  Interim History: Has been trying to ambulate and was doing well until yesterday. She tried to ambulate and she would do fine, but then desaturated to the 40s. Started coughing up and was nauseous. Does endorse burning sensation in her throat and worsening with foods. She used to be on gabapentin but is not on it any more. Is requesting a letter to apply for a humanitarian visa for her son, whom he has not seen in 28 years.   OBJECTIVE:  Vital Signs: Vitals:   03/05/23 1607 03/05/23 1842 03/05/23 2105 03/06/23 0422  BP: 95/61 114/76 108/80 103/74  Pulse: (!) 110 (!) 114 85 77  Resp: 20 19    Temp: 98.4 F (36.9 C)  98.3 F (36.8 C) 98.4 F (36.9 C)  TempSrc:      SpO2: 94% 91% 94% 97%   Supplemental O2: Nasal Cannula SpO2: 97 % O2 Flow Rate (L/min): 6 L/min  There were no vitals filed for this visit.   Intake/Output Summary (Last 24 hours) at 03/06/2023 0843 Last data filed at 03/06/2023 0415 Gross per 24 hour  Intake 354.41 ml  Output 500 ml  Net -145.59 ml   Net IO Since Admission: -145.59 mL [03/06/23 0843]  Physical Exam: Physical Exam Cardiovascular:     Rate and Rhythm: Regular rhythm. Tachycardia present.     Heart sounds: S1 normal and S2 normal. Heart sounds not distant. Murmur heard.     No friction rub. No gallop.  Pulmonary:     Effort: Accessory muscle usage, respiratory distress and retractions present.     Breath sounds: No decreased air movement. Examination of the right-upper field reveals rales. Examination of the left-upper field reveals rales. Examination of the right-middle field reveals rales. Examination of the left-middle field reveals rales.  Examination of the right-lower field reveals rales. Examination of the left-lower field reveals rales. Rales present. No decreased breath sounds, wheezing or rhonchi.     Comments: Fine crackles  Abdominal:     General: Abdomen is flat.     Tenderness: There is no abdominal tenderness.  Musculoskeletal:     Right lower leg: No edema.     Left lower leg: No edema.     Patient Lines/Drains/Airways Status     Active Line/Drains/Airways     Name Placement date Placement time Site Days   Peripheral IV 03/05/23 20 G Anterior;Left Forearm 03/05/23  1008  Forearm  1             ASSESSMENT/PLAN:  Assessment: Principal Problem:   Acute on chronic hypoxic respiratory failure (HCC)  Olivia [name] is a 57 y.o. with a pertinent PMH of ILD, GERD, hiatal hernia, and recent admission for acute on chronic respiratory failure 2/2 to ILD readmitted due to desaturations while ambulating, acutely worsening overnight.   Plan:  Acute on Chronic Respiratory Failure Hx Interstitial Pulmonary Fibrosis  An echo was done at her last admission which showed a preserved EF and no concerns for increased PAH. She was diuresed then, but would get hypokalemic and does not have signs of volume overload. On exam she does have fine crackles in all lung fields. No BLE, abdominal distention. No coarse crackles. BNP slightly increased since  last admission at 38.6 (17.8 on 02/19/2023) Will obtain standing weights and strict I/Os in this admission to monitor. Does not have orthopnea. Abrupt onset and worsening oxygen saturations with increased hazy airspace opacities in bilateral midlung zones in XR likely related to aspiration PNA. WBC during her last hospitalization just mildly elevated in the 10-12. This admission at 18 with decreased doses of prednisone. She is afebrile. Resp panel is negative. Did say that her hands were getting sweaty but no fevers. Was given one dose of ceftriaxone and azithromycin ant the  ED and was switched to Unasyn yesterday. She could go on PO Augmentin today to cover for a possible PNA. Endorses that she has been compliant with her steroids and taking it daily. However, ambulation has been a challenge for her at home. She has very little lung reserve. She is on 40 mg prednisone until she sees pulmonary on 11/17. Has been unable to tolerate PFTs due to cough High Res CT scheduled for 11/5. Will consult PCCM. Treating cough may help as it leads to her N/V -> desaturations.  Plan: -cw albuterol q4 PRN, monitor for hypokalemia with RFP -tessalon pearls, rubitussin, chloraseptic for symptomatic relief  -will start on 300mg  BID of gabapentin for a week then 300mg  TID, since this was the dose she tolerated in the past  -Cw PPI treatment for GERD -cw Prednisone 40mg  daily  -start Augmentin, dc Unasyn -fasting cbgs in the AM -Consult PCCM  -Has FU with PCCM on 11/17, High res CT on 11/5 -Consult Palliative  -Daily standing weights  -I/O   GERD Hiatal Hernia Cw Pantoprazole 40mg  daily   Best Practice: Diet: Regular diet IVF: Fluids: None VTE: enoxaparin (LOVENOX) injection 40 mg Start: 03/05/23 2200 Code: Full AB: Augmentin DISPO: Anticipated discharge in 2 days to Home pending  medical work-up .  Signature: [facility]  Internal Medicine Resident, PGY-1 [facility]  Pager: [phone] 8:43 AM, 03/06/2023   Please contact the on call pager after 5 pm and on weekends at [phone].

## 2023-03-07 DIAGNOSIS — R051 Acute cough: Secondary | ICD-10-CM

## 2023-03-07 LAB — BASIC METABOLIC PANEL
Anion gap: 8 (ref 5–15)
BUN: 14 mg/dL (ref 6–20)
CO2: 27 mmol/L (ref 22–32)
Calcium: 8.7 mg/dL — ABNORMAL LOW (ref 8.9–10.3)
Chloride: 102 mmol/L (ref 98–111)
Creatinine, Ser: 0.48 mg/dL (ref 0.44–1.00)
GFR, Estimated: >60 mL/min (ref >60)
Glucose, Bld: 96 mg/dL (ref 70–99)
Potassium: 3.9 mmol/L (ref 3.5–5.1)
Sodium: 137 mmol/L (ref 135–145)

## 2023-03-07 LAB — CBC
HCT: 34.9 % — ABNORMAL LOW (ref 36.0–46.0)
Hemoglobin: 11.3 g/dL — ABNORMAL LOW (ref 12.0–15.0)
MCH: 29.9 pg (ref 26.0–34.0)
MCHC: 32.4 g/dL (ref 30.0–36.0)
MCV: 92.3 fL (ref 80.0–100.0)
Platelets: 334 10*3/uL (ref 150–400)
RBC: 3.78 MIL/uL — ABNORMAL LOW (ref 3.87–5.11)
RDW: 12.3 % (ref 11.5–15.5)
WBC: 12 10*3/uL — ABNORMAL HIGH (ref 4.0–10.5)
nRBC: 0 % (ref 0.0–0.2)

## 2023-03-07 LAB — GLUCOSE, CAPILLARY
Glucose-Capillary: 82 mg/dL (ref 70–99)
Glucose-Capillary: 128 mg/dL — ABNORMAL HIGH (ref 70–99)

## 2023-03-07 MED ORDER — ENSURE MAX PROTEIN PO LIQD
11.0000 [oz_av] | Freq: Two times a day (BID) | ORAL | Status: DC
  Administered 2023-03-07: 11 [oz_av] via ORAL
  Administered 2023-03-07: 237 mL via ORAL
  Administered 2023-03-08 – 2023-03-09 (×2): 11 [oz_av] via ORAL
  Filled 2023-03-07 (×8): qty 330

## 2023-03-07 MED ORDER — GUAIFENESIN-DM 100-10 MG/5ML PO SYRP
5.0000 mL | ORAL_SOLUTION | ORAL | Status: AC
  Administered 2023-03-07 – 2023-03-08 (×5): 5 mL via ORAL
  Filled 2023-03-07 (×8): qty 5

## 2023-03-07 NOTE — Hospital Course (Signed)
Olivia [name] is a 57 y.o. who presented with worsening SOB with recent hospitalization for acute on chronic hypoxia secondary to her ILD. She stated that ambulating at home was more difficult for her and at one point her husband was helping her while she was at rest and desaturated down to the 40s. Unclear if her husband had stepped on the Nasal Cannula.   During her hospitalization she required over 7L of O2 at rest on the first day. Was started on albuterol PRN, Robitussin, tessalon pearls, and chloraseptic. She also has a history of GERD, continued on 40mg  of pantoprazole. In the past she also found benefit with gabapentin in controlling her cough. She was started on 300mg  of gabapentin BID. Although she remained afebrile, she did have a slightly higher leukocytosis than what she had in her prior hospitalization on the same dose of steroids and CXR was also concerning for aspiration PNA. She was started on IV antibiotics and then transitioned to po Augmentin.    At the time of her discharge her oxygen needs were the following:   ****    Additionally, PCCM was consulted and it was recommended she started PCP prophylaxis until doses of prednisone 20mg .    She is to continue on the following until she follows up with PCCM:  Olivia [name]:  -cw 40mg  prednisone daily -Continue taking TMX-SMP for PCP ppx on MWF as long as prednisone is >20mg  daily  -cw tessalon pearls, Robitussin, chlorseptic spray  -cw gabapentin 300mg  BID  -cw Augmentin for 3 doses  -cw Pantoprazole 40mg  daily  -cw albuterol q4hrs PRN and duonebs as needed  Please consider repeating a CBC and RFP at her next follow up appointment  Please ensure that she has followed up with pulmonology Please ensure that the has gotten her HR CT before she sees pulmonology  Please ensure adherence to treatment and modify accordingly if needed during follow-up.  Follow up with [facility] in the clinic     Olivia [name],    Gracias por permitirnos tomar cuidado de usted durante su estadia en el hospital. Olivia [name] hospitalizada porque tiene fibrosis pulmonar. Le estado dando de alta con el siguiente regimen hasta que vea al pulmonologo el 11/12:   -Continue tomando prednisona 40mg  diariamente  -Continue tomando Bactrim los lunes, miercoles y viernes cada semana (este remedio ayuda a prevenir infecciones que son asociadas con el uso de la prednisona a Air cabin crew)   -Continue tomando Augmentin:  --03/10/2023: Olivia [name] tableta esta noche --11/5, Tuesday, Una tableta en la manana y una tableta en la noche  -Continue tomando Pantoprazole or Omeprazole: una tableta diaria -Continue con el sistema de nebulizaciones cuando sea necesario; ese medicament se llama ipratropium-albuterol -haga las nebulizaciones 3 veces al dia Albuterol-proventil - usela  cada 4 horas SOLO cuando necesario -Gabapentina 300mg  dos veces al dia  -Robitussin cada 4 horas cuando sea necesario  -Pildoras de Benzonatate (Tessalon pearls) tres veces al dia  -Continue usando su aparto de inspiracion para ayudarle a abrir Education officer, environmental -Potasio - tome una pildora de 20 meq  (dos tabletas) una vez al dia  los dias que use nebulizacion hasta que vea al doctor en la clinica  -Para la sequedad en su nariz, utilize la solucion de salina en aerosol cuando sea necesario  Por favor obtenga su tomografia el 11/5 y vea al pulmonologo el 11/12.   Por favor venga a la clinica de medicina interna el 11/7 para seguimiento del hospital.   Olivia [name],  Olivia Crocker, MD

## 2023-03-07 NOTE — Plan of Care (Signed)
  Problem: Clinical Measurements: Goal: Respiratory complications will improve Outcome: Progressing   Problem: Activity: Goal: Risk for activity intolerance will decrease Outcome: Progressing   Problem: Pain Management: Goal: General experience of comfort will improve Outcome: Progressing   Problem: Safety: Goal: Ability to remain free from injury will improve Outcome: Progressing   Problem: Skin Integrity: Goal: Risk for impaired skin integrity will decrease Outcome: Progressing

## 2023-03-07 NOTE — Progress Notes (Signed)
Pt on 5L O2. Resting O2 sat 93%. Pt ambulated approximately 150 feet, tolerated fair. During ambulation, O2 sat dropped to 81%. Increased to 6L O2. O2 sat increased to 84% and continued increasing up to 93% when sitting after ambulation. Olivia Neptune, MD aware.

## 2023-03-07 NOTE — Progress Notes (Signed)
Daily Progress Note   Patient Name: Olivia [name]       Date: 03/07/2023 DOB: [birth year]  Age: 57 y.o. MRN#: [id number] Attending Physician: Ginnie Smart, MD Primary Care Physician: Faith Rogue, DO Admit Date: 03/05/2023  Reason for Consultation/Follow-up: Establishing goals of care  Subjective: Breathing better today, feels more tired/dizzy today  Length of Stay: 1  Current Medications: Scheduled Meds:   amoxicillin-clavulanate  1 tablet Oral Q12H   benzonatate  100 mg Oral TID   gabapentin  300 mg Oral BID   [START ON 03/13/2023] gabapentin  300 mg Oral TID   guaiFENesin-dextromethorphan  5 mL Oral Q4H   pantoprazole  40 mg Oral Daily   predniSONE  40 mg Oral Q breakfast   Ensure Max Protein  11 oz Oral BID   rivaroxaban  10 mg Oral Daily   senna-docusate  1 tablet Oral QHS   sulfamethoxazole-trimethoprim  1 tablet Oral Once per day on Monday Wednesday Friday    Continuous Infusions:   PRN Meds: acetaminophen **OR** acetaminophen, albuterol, phenol, sodium chloride  Physical Exam Constitutional:      General: She is not in acute distress.    Appearance: She is ill-appearing.  Pulmonary:     Effort: Pulmonary effort is normal.  Skin:    General: Skin is warm and dry.  Neurological:     Mental Status: She is oriented to person, place, and time.             Vital Signs: BP (!) 109/54 (BP Location: Left Arm)   Pulse (!) 110   Temp 98.2 F (36.8 C)   Resp 20   Ht 4\' 11"  (1.499 m)   Wt 63 kg   LMP  (LMP Unknown)   SpO2 90%   BMI 28.05 kg/m  SpO2: SpO2: 90 % O2 Device: O2 Device: Nasal Cannula O2 Flow Rate: O2 Flow Rate (L/min): 6 L/min  Intake/output summary:  Intake/Output Summary (Last 24 hours) at 03/07/2023 1532 Last data filed at 03/07/2023  0930 Gross per 24 hour  Intake 730 ml  Output 200 ml  Net 530 ml   LBM: Last BM Date : 03/05/23 Baseline Weight: Weight: 63 kg Most recent weight: Weight: 63 kg       Palliative Assessment/Data: PPS 50%      Patient Active Problem List   Diagnosis Date Noted   Pulmonary fibrosis (HCC) 03/06/2023   SOB (shortness of breath) 02/26/2023   ILD (interstitial lung disease) (HCC) 02/26/2023   Malnutrition of moderate degree 02/20/2023   Acute on chronic hypoxic respiratory failure (HCC) 02/19/2023   Unintentional weight loss 02/19/2023   Positive FIT (fecal immunochemical test) 11/05/2021   Upper respiratory infection 11/05/2021   Rib pain on right side 11/05/2021   Acute cystitis with hematuria 08/18/2021   Mononeuropathy 07/20/2021   Colon cancer screening 07/20/2021   PVD (peripheral vascular disease) (HCC) 07/20/2021   Acute on chronic respiratory failure with hypoxia (HCC) 07/07/2021   GERD (gastroesophageal reflux disease) 07/07/2021   Abdominal pain 06/06/2021   NSIP (nonspecific interstitial pneumonia) (HCC) 05/26/2020   Dyslipidemia 12/21/2018   Exercise hypoxemia 08/05/2018   Chronic cough 03/20/2018   Right nephrolithiasis 09/07/2017  History of depression 02/14/2017   Microscopic hematuria 06/13/2008   Hemorrhoids 07/03/2006    Palliative Care Assessment & Plan   HPI: 57 y.o. female  with past medical history of  ILD, GERD, hiatal hernia, and recent admission for acute on chronic respiratory failure 2/2 to ILD admitted on 03/05/2023 with desaturations while ambulating, acutely worsening overnight.  Patient diagnosed with acute on chronic respiratory failure.  PMT consulted to discuss goals of care.   Assessment: Spanish interpreter utilized for entire conversation. Feels better today - less short of breath, cough is better. Had BM earlier today - wants to continue senna. Back pain  is better. Does complain of some dizziness/tiredness. We reviewed some of  the medications for her cough have been increased and could explain symptoms - she feels she has benefited from these as her cough is much better so she would like to continue current regimen for now and see how she feels tomorrow.   Recommendations/Plan: full code/full scope Patient would accept short-term trial of intubation, would never want long-term -requested she discuss this with family  As needed Tylenol for back pain senna nightly Some dizziness/tiredness today - wants to continue current medication regimen and see how she feels tomorrow  Code Status: Full code  Care plan was discussed with patient  Thank you for allowing the Palliative Medicine Team to assist in the care of this patient.   Total Time 35 minutes Prolonged Time Billed  no   Time spent includes: Detailed review of medical records (labs, imaging, vital signs), medically appropriate exam, discussion with treatment team, counseling and educating patient, family and/or staff, documenting clinical information, medication management and coordination of care.     *Please note that this is a verbal dictation therefore any spelling or grammatical errors are due to the "[employer] One" system interpretation.  Gerlean Ren, DNP, [facility] Palliative Medicine Team Team Phone # [phone]  Pager [phone]

## 2023-03-07 NOTE — Progress Notes (Addendum)
HD#1 SUBJECTIVE:  Patient Summary: Olivia [name] is a 57 y.o. with a pertinent PMH of ILD, GERD, hiatal hernia, and recent admission for acute on chronic respiratory failure 2/2 to ILD readmitted due to desaturations while ambulating, acutely worsening overnight.   Overnight Events: NAEO  Interim History: Feels better but feels like she has phlegm and not able to expectorate  OBJECTIVE:  Vital Signs: Vitals:   03/06/23 1312 03/06/23 1622 03/07/23 0417 03/07/23 0945  BP:  95/65  (!) 109/54  Pulse:  66  (!) 110  Resp: (!) 24 18  20   Temp:  (!) 97.5 F (36.4 C)  98.2 F (36.8 C)  TempSrc:  Oral    SpO2:  96%  (!) 87%  Weight:   63 kg   Height:   4\' 11"  (1.499 m)    Supplemental O2: Nasal Cannula SpO2: (!) 87 % O2 Flow Rate (L/min): 5 L/min  Filed Weights   03/07/23 0417  Weight: 63 kg     Intake/Output Summary (Last 24 hours) at 03/07/2023 1133 Last data filed at 03/06/2023 2200 Gross per 24 hour  Intake 490 ml  Output 200 ml  Net 290 ml   Net IO Since Admission: 384.41 mL [03/07/23 1133]  Physical Exam: Physical Exam Cardiovascular:     Rate and Rhythm: Regular rhythm. Tachycardia present.     Heart sounds: S1 normal and S2 normal. Heart sounds not distant. Murmur heard.     No friction rub. No gallop.  Pulmonary:     Effort: Retractions present. No accessory muscle usage or respiratory distress.     Breath sounds: No decreased air movement. Examination of the right-middle field reveals rales. Examination of the left-middle field reveals rales. Examination of the right-lower field reveals rales. Examination of the left-lower field reveals rales. Rales present. No decreased breath sounds, wheezing or rhonchi.     Comments: Fine crackles  Abdominal:     General: Abdomen is flat.     Tenderness: There is no abdominal tenderness.  Musculoskeletal:     Right lower leg: No edema.     Left lower leg: No edema.     Patient Lines/Drains/Airways Status      Active Line/Drains/Airways     Name Placement date Placement time Site Days   Peripheral IV 03/05/23 20 G Anterior;Left Forearm 03/05/23  1008  Forearm  1             ASSESSMENT/PLAN:  Assessment: Principal Problem:   Acute on chronic hypoxic respiratory failure (HCC) Active Problems:   Pulmonary fibrosis (HCC)  Olivia [name] is a 57 y.o. with a pertinent PMH of ILD, GERD, hiatal hernia, and recent admission for acute on chronic respiratory failure 2/2 to ILD readmitted due to desaturations while ambulating, acutely worsening overnight.   Plan:  Acute on Chronic Respiratory Failure Hx Interstitial Pulmonary Fibrosis  Cough is better but feels she cannot expectorate. Desaturated to 81% ambulating. Has not gotten albuterol yet. Appreciate PCCM and Palliative's involvement with this case. Plan: -cw albuterol q4 PRN, monitor for hypokalemia with RFP -Scheduling rubitussin for now -tessalon pearls, chloraseptic for symptomatic relief  -cw 300mg  BID of gabapentin for a week then 300mg  TID, since this was the dose she tolerated in the past  -Cw PPI treatment for GERD -cw Prednisone 40mg  daily  -cw Augmentin -fasting cbgs in the AM -Has FU with PCCM on 11/12, High res CT on 11/5 -Daily standing weights  -I/O   GERD Hiatal Hernia Cw  Pantoprazole 40mg  daily   Constipation  -cw senna daily   Back pain  -Tylenol PRN  Weight loss  Likely related to her SOB. At prior admission RD was consulted and advised to use protein supplement BID and snacks TID.  -Cw same  Best Practice: Diet: Regular diet IVF: Fluids: None VTE: rivaroxaban (XARELTO) tablet 10 mg Start: 03/06/23 1015 Code: Full AB: Augmentin DISPO: Anticipated discharge in 2 days to Home pending  medical work-up .  Signature: [facility]  Internal Medicine Resident, PGY-1 [facility]  Pager: [phone] 11:33 AM, 03/07/2023   Please contact the on  call pager after 5 pm and on weekends at [phone].

## 2023-03-08 LAB — RENAL FUNCTION PANEL
Albumin: 2.8 g/dL — ABNORMAL LOW (ref 3.5–5.0)
Albumin: 3 g/dL — ABNORMAL LOW (ref 3.5–5.0)
Anion gap: 9 (ref 5–15)
Anion gap: 8 (ref 5–15)
BUN: 15 mg/dL (ref 6–20)
BUN: 16 mg/dL (ref 6–20)
CO2: 27 mmol/L (ref 22–32)
CO2: 25 mmol/L (ref 22–32)
Calcium: 8.8 mg/dL — ABNORMAL LOW (ref 8.9–10.3)
Calcium: 9 mg/dL (ref 8.9–10.3)
Chloride: 99 mmol/L (ref 98–111)
Chloride: 100 mmol/L (ref 98–111)
Creatinine, Ser: 0.58 mg/dL (ref 0.44–1.00)
Creatinine, Ser: 0.61 mg/dL (ref 0.44–1.00)
GFR, Estimated: >60 mL/min (ref >60)
GFR, Estimated: >60 mL/min (ref >60)
Glucose, Bld: 109 mg/dL — ABNORMAL HIGH (ref 70–99)
Glucose, Bld: 170 mg/dL — ABNORMAL HIGH (ref 70–99)
Phosphorus: 4.1 mg/dL (ref 2.5–4.6)
Phosphorus: 3.8 mg/dL (ref 2.5–4.6)
Potassium: 3.4 mmol/L — ABNORMAL LOW (ref 3.5–5.1)
Potassium: 4.3 mmol/L (ref 3.5–5.1)
Sodium: 135 mmol/L (ref 135–145)
Sodium: 133 mmol/L — ABNORMAL LOW (ref 135–145)

## 2023-03-08 LAB — CBC
HCT: 35.5 % — ABNORMAL LOW (ref 36.0–46.0)
Hemoglobin: 11.8 g/dL — ABNORMAL LOW (ref 12.0–15.0)
MCH: 30.1 pg (ref 26.0–34.0)
MCHC: 33.2 g/dL (ref 30.0–36.0)
MCV: 90.6 fL (ref 80.0–100.0)
Platelets: 333 10*3/uL (ref 150–400)
RBC: 3.92 MIL/uL (ref 3.87–5.11)
RDW: 12.1 % (ref 11.5–15.5)
WBC: 11.2 10*3/uL — ABNORMAL HIGH (ref 4.0–10.5)
nRBC: 0 % (ref 0.0–0.2)

## 2023-03-08 LAB — GLUCOSE, CAPILLARY
Glucose-Capillary: 87 mg/dL (ref 70–99)
Glucose-Capillary: 131 mg/dL — ABNORMAL HIGH (ref 70–99)

## 2023-03-08 LAB — MAGNESIUM: Magnesium: 2.2 mg/dL (ref 1.7–2.4)

## 2023-03-08 LAB — VITAMIN D 25 HYDROXY (VIT D DEFICIENCY, FRACTURES): Vit D, 25-Hydroxy: 30.33 ng/mL (ref 30–100)

## 2023-03-08 MED ORDER — IPRATROPIUM-ALBUTEROL 0.5-2.5 (3) MG/3ML IN SOLN: 3.0000 mL | Freq: Four times a day (QID) | RESPIRATORY_TRACT | Status: DC

## 2023-03-08 MED ORDER — INSULIN ASPART 100 UNIT/ML IJ SOLN
0.0000 [IU] | Freq: Three times a day (TID) | INTRAMUSCULAR | Status: DC
  Administered 2023-03-09: 3 [IU] via SUBCUTANEOUS
  Administered 2023-03-10: 2 [IU] via SUBCUTANEOUS
  Administered 2023-03-10: 3 [IU] via SUBCUTANEOUS

## 2023-03-08 MED ORDER — FUROSEMIDE 40 MG PO TABS
40.0000 mg | ORAL_TABLET | Freq: Once | ORAL | Status: AC
  Administered 2023-03-08: 40 mg via ORAL
  Filled 2023-03-08: qty 1

## 2023-03-08 MED ORDER — IPRATROPIUM-ALBUTEROL 0.5-2.5 (3) MG/3ML IN SOLN
3.0000 mL | Freq: Four times a day (QID) | RESPIRATORY_TRACT | Status: DC
  Administered 2023-03-08 – 2023-03-10 (×9): 3 mL via RESPIRATORY_TRACT
  Filled 2023-03-08 (×9): qty 3

## 2023-03-08 MED ORDER — SALINE SPRAY 0.65 % NA SOLN
1.0000 | Freq: Three times a day (TID) | NASAL | Status: DC
Start: 2023-03-08 — End: 2023-03-10
  Administered 2023-03-08 – 2023-03-10 (×8): 1 via NASAL
  Filled 2023-03-08: qty 44

## 2023-03-08 MED ORDER — POTASSIUM CHLORIDE CRYS ER 20 MEQ PO TBCR
40.0000 meq | EXTENDED_RELEASE_TABLET | Freq: Two times a day (BID) | ORAL | Status: AC
  Administered 2023-03-08 (×2): 40 meq via ORAL
  Filled 2023-03-08 (×2): qty 2

## 2023-03-08 MED ORDER — GUAIFENESIN-DM 100-10 MG/5ML PO SYRP
5.0000 mL | ORAL_SOLUTION | Freq: Once | ORAL | Status: AC
  Administered 2023-03-08: 5 mL via ORAL

## 2023-03-08 MED ORDER — INSULIN ASPART 100 UNIT/ML IJ SOLN: 0.0000 [IU] | Freq: Every day | INTRAMUSCULAR | Status: DC

## 2023-03-08 NOTE — Progress Notes (Signed)
HD#2 SUBJECTIVE:  Patient Summary: Olivia [name] is a 57 y.o. with a pertinent PMH of ILD, GERD, hiatal hernia, and recent admission for acute on chronic respiratory failure 2/2 to ILD readmitted due to desaturations while ambulating, acutely worsening overnight.   Overnight Events: NAEO  Interim History: Feeling slightly improved though continues to feel short of breath with minimal exertion. She has tried the duonebs with some improvement in the past. She is also concerned about the progressive loss of weight and decreased appetite over the past months. No chest pain, increased sputum production (stable from before). Has tolerated PO.  OBJECTIVE:  Vital Signs: Vitals:   03/08/23 1127 03/08/23 1506 03/08/23 1640 03/08/23 2016  BP:   97/69 99/71  Pulse: 92  100 98  Resp:   19 20  Temp:   (!) 97.5 F (36.4 C) 98.2 F (36.8 C)  TempSrc:   Oral Oral  SpO2: 96% 94% 97% 97%  Weight:      Height:       Supplemental O2: Nasal Cannula SpO2: 97 % O2 Flow Rate (L/min): 6 L/min  Filed Weights   03/07/23 0417  Weight: 63 kg     Intake/Output Summary (Last 24 hours) at 03/08/2023 2023 Last data filed at 03/08/2023 1300 Gross per 24 hour  Intake 695 ml  Output 500 ml  Net 195 ml   Net IO Since Admission: 1,299.41 mL [03/08/23 2023]  Physical Exam: Physical Exam HENT:     Mouth/Throat:     Mouth: Mucous membranes are moist.  Cardiovascular:     Rate and Rhythm: Regular rhythm. Tachycardia present.     Heart sounds: S1 normal and S2 normal. Heart sounds not distant. Murmur heard.     No friction rub. No gallop.  Pulmonary:     Effort: Retractions present. No accessory muscle usage or respiratory distress.     Breath sounds: No decreased air movement. Examination of the right-middle field reveals rales. Examination of the left-middle field reveals rales. Examination of the right-lower field reveals rales. Examination of the left-lower field reveals rales. Rales present.  No decreased breath sounds, wheezing or rhonchi.     Comments: Fine crackles  Abdominal:     General: Abdomen is flat.     Tenderness: There is no abdominal tenderness.  Musculoskeletal:     Cervical back: Normal range of motion.     Right lower leg: No edema.     Left lower leg: No edema.     Comments: Bilateral clubbing of the nails of the hands  Skin:    General: Skin is warm and dry.  Neurological:     Mental Status: She is alert.     Patient Lines/Drains/Airways Status     Active Line/Drains/Airways     Name Placement date Placement time Site Days   Peripheral IV 03/05/23 20 G Anterior;Left Forearm 03/05/23  1008  Forearm  1             ASSESSMENT/PLAN:  Assessment: Principal Problem:   Acute on chronic hypoxic respiratory failure (HCC) Active Problems:   Pulmonary fibrosis (HCC)  Olivia [name] is a 57 y.o. with a pertinent PMH of ILD, GERD, hiatal hernia, and recent admission for acute on chronic respiratory failure 2/2 to ILD readmitted due to desaturations on continued high doses of prednisone, now on PJP prophylaxis and PPI, awaiting improvement prior to safe discharge to home  Acute on Chronic Respiratory Failure Hx Interstitial Pulmonary Fibrosis  Continues to desaturate  below 85% on 6L [place] while ambulating, with time to recovery of >2 min per nurse, even after albuterol treatment.  Appreciate PCCM and Palliative's involvement with this case. Given difficulty optimizing patient to ensure safe discharge home, will add a repeat dose of IV lasix 40 mg today, scheduled duonebs, and PRN albuterol to attempt better saturations on ambulation. 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

## 2023-03-08 NOTE — Progress Notes (Signed)
At rest with 6L O2 pt saturated at 88% and while ambulating she saturated in between 77%-85% most part of the ambulation.  She was dyspneic when we reached her room and took almost more than 2 min for her sats to be at 90%. This is after  her neb treatment given.   Her HR was also in low 120s.

## 2023-03-08 NOTE — Plan of Care (Signed)
  Problem: Education: Goal: Knowledge of General Education information will improve Description: Including pain rating scale, medication(s)/side effects and non-pharmacologic comfort measures Outcome: Completed/Met

## 2023-03-09 DIAGNOSIS — J9620 Acute and chronic respiratory failure, unspecified whether with hypoxia or hypercapnia: Secondary | ICD-10-CM

## 2023-03-09 LAB — GLUCOSE, CAPILLARY
Glucose-Capillary: 110 mg/dL — ABNORMAL HIGH (ref 70–99)
Glucose-Capillary: 106 mg/dL — ABNORMAL HIGH (ref 70–99)
Glucose-Capillary: 170 mg/dL — ABNORMAL HIGH (ref 70–99)
Glucose-Capillary: 147 mg/dL — ABNORMAL HIGH (ref 70–99)

## 2023-03-09 LAB — RENAL FUNCTION PANEL
Albumin: 3 g/dL — ABNORMAL LOW (ref 3.5–5.0)
Albumin: 3.3 g/dL — ABNORMAL LOW (ref 3.5–5.0)
Anion gap: 10 (ref 5–15)
Anion gap: 12 (ref 5–15)
BUN: 12 mg/dL (ref 6–20)
BUN: 14 mg/dL (ref 6–20)
CO2: 25 mmol/L (ref 22–32)
CO2: 21 mmol/L — ABNORMAL LOW (ref 22–32)
Calcium: 9.1 mg/dL (ref 8.9–10.3)
Calcium: 9.1 mg/dL (ref 8.9–10.3)
Chloride: 100 mmol/L (ref 98–111)
Chloride: 101 mmol/L (ref 98–111)
Creatinine, Ser: 0.56 mg/dL (ref 0.44–1.00)
Creatinine, Ser: 0.84 mg/dL (ref 0.44–1.00)
GFR, Estimated: >60 mL/min (ref >60)
GFR, Estimated: >60 mL/min (ref >60)
Glucose, Bld: 102 mg/dL — ABNORMAL HIGH (ref 70–99)
Glucose, Bld: 217 mg/dL — ABNORMAL HIGH (ref 70–99)
Phosphorus: 4.1 mg/dL (ref 2.5–4.6)
Phosphorus: 2.9 mg/dL (ref 2.5–4.6)
Potassium: 3.6 mmol/L (ref 3.5–5.1)
Potassium: 4.3 mmol/L (ref 3.5–5.1)
Sodium: 135 mmol/L (ref 135–145)
Sodium: 134 mmol/L — ABNORMAL LOW (ref 135–145)

## 2023-03-09 LAB — CBC
HCT: 36.9 % (ref 36.0–46.0)
Hemoglobin: 12.2 g/dL (ref 12.0–15.0)
MCH: 30.2 pg (ref 26.0–34.0)
MCHC: 33.1 g/dL (ref 30.0–36.0)
MCV: 91.3 fL (ref 80.0–100.0)
Platelets: 342 10*3/uL (ref 150–400)
RBC: 4.04 MIL/uL (ref 3.87–5.11)
RDW: 12.1 % (ref 11.5–15.5)
WBC: 9.8 10*3/uL (ref 4.0–10.5)
nRBC: 0 % (ref 0.0–0.2)

## 2023-03-09 LAB — HEMOGLOBIN A1C
Hgb A1c MFr Bld: 5.6 % (ref 4.8–5.6)
Mean Plasma Glucose: 114.02 mg/dL

## 2023-03-09 MED ORDER — FUROSEMIDE 40 MG PO TABS
40.0000 mg | ORAL_TABLET | Freq: Once | ORAL | Status: AC
  Administered 2023-03-09: 40 mg via ORAL
  Filled 2023-03-09: qty 1

## 2023-03-09 MED ORDER — POTASSIUM CHLORIDE CRYS ER 20 MEQ PO TBCR
40.0000 meq | EXTENDED_RELEASE_TABLET | Freq: Once | ORAL | Status: AC
  Administered 2023-03-09: 40 meq via ORAL
  Filled 2023-03-09: qty 2

## 2023-03-09 MED ORDER — AZELASTINE HCL 0.1 % NA SOLN
1.0000 | Freq: Two times a day (BID) | NASAL | Status: DC
  Administered 2023-03-09 – 2023-03-10 (×3): 1 via NASAL
  Filled 2023-03-09: qty 30

## 2023-03-09 NOTE — Progress Notes (Addendum)
HD#3 SUBJECTIVE:  Patient Summary: Olivia [name] is a 57 y.o. with a pertinent PMH of ILD, GERD, hiatal hernia, and recent admission for acute on chronic respiratory failure 2/2 to ILD readmitted due to desaturations while ambulating, slowing improving during this hospitalization  Overnight Events None  Interim History: F  Continues to endorse improvement with movements close to bed while wearing 6L. Improvement in oral intake and overall mood.  OBJECTIVE:  Vital Signs: Vitals:   03/08/23 2145 03/09/23 0407 03/09/23 0825 03/09/23 0905  BP: 102/73 111/77 101/75   Pulse: 92 82 (!) 105   Resp:  18 19   Temp: 98.3 F (36.8 C) 97.8 F (36.6 C) 97.6 F (36.4 C)   TempSrc:  Oral    SpO2: 98% 99% 98% 95%  Weight:      Height:       Supplemental O2: Nasal Cannula SpO2: 95 % O2 Flow Rate (L/min): 6 L/min  Filed Weights   03/07/23 0417  Weight: 63 kg     Intake/Output Summary (Last 24 hours) at 03/09/2023 1314 Last data filed at 03/09/2023 0600 Gross per 24 hour  Intake 180 ml  Output 400 ml  Net -220 ml   Net IO Since Admission: 1,079.41 mL [03/09/23 1314]  Physical Exam:  Chronically ill appearing woman resting in bed in NAD Tachycardic Speaking in short sentences with increased WOB, on [place] 6L,fine crackles and rales throughout Soft abdomen Warm extremities and dry skin Pleasant mood and affect  Patient Lines/Drains/Airways Status     Active Line/Drains/Airways     Name Placement date Placement time Site Days   Peripheral IV 03/05/23 20 G Anterior;Left Forearm 03/05/23  1008  Forearm  1             ASSESSMENT/PLAN:  Assessment: Principal Problem:   Acute on chronic hypoxic respiratory failure (HCC) Active Problems:   Pulmonary fibrosis (HCC)  Olivia [name] is a 57 y.o. with a pertinent PMH of ILD, GERD, hiatal hernia, and recent admission for acute on chronic respiratory failure 2/2 to ILD readmitted due to desaturations on  continued high doses of prednisone, now on PJP prophylaxis and PPI, awaiting improvement prior to safe discharge to home  Acute on Chronic Respiratory Failure Hx Interstitial Pulmonary Fibrosis  Unchanged desaturations 85% on 6L [place] while ambulating with increased time to recovery. Not stable for discharge today. Will add scheduled flutter valve, IS, and chest vest physiotherapy with vibration to attempt improvement. If unable, may need to re-engage palliative care for goals of care with this new baseline for patient. -Repeat dose of IV lasix today as it provided some symptomatic relief -Continue on Prednisone 40mg  daily  -Follow up PM RFP for electrolyte disturbances -scheduled duo nebs and PRN albuterol -Saline nasal spray PRN -tessalon pearls, chloraseptic for symptomatic relief  -Per pulm, continue on Gabapentin 300 mg BID for cough suppression -PPI treatment for steroid prophylaxis -Augmentin BID until 11/4 -Add vest physiotherapy, flutter valve, and IS -Has FU with PCCM on 11/12, High res CT on 11/5  GERD Hiatal Hernia Cw Pantoprazole 40mg  daily   Constipation  -cw senna daily   Back pain  -Tylenol PRN  Weight loss  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 Internal Medicine Program - PGY-2 03/09/2023, 1:14 PM Pager# [phone] Please contact the on call pager after 5 pm and on weekends at [phone].

## 2023-03-10 ENCOUNTER — Other Ambulatory Visit (HOSPITAL_COMMUNITY): Payer: Self-pay

## 2023-03-10 LAB — CBC WITH DIFFERENTIAL/PLATELET
Abs Immature Granulocytes: 0.06 10*3/uL (ref 0.00–0.07)
Basophils Absolute: 0 10*3/uL (ref 0.0–0.1)
Basophils Relative: 0 %
Eosinophils Absolute: 0.5 10*3/uL (ref 0.0–0.5)
Eosinophils Relative: 4 %
HCT: 37.1 % (ref 36.0–46.0)
Hemoglobin: 12.6 g/dL (ref 12.0–15.0)
Immature Granulocytes: 0 %
Lymphocytes Relative: 21 %
Lymphs Abs: 2.9 10*3/uL (ref 0.7–4.0)
MCH: 31 pg (ref 26.0–34.0)
MCHC: 34 g/dL (ref 30.0–36.0)
MCV: 91.4 fL (ref 80.0–100.0)
Monocytes Absolute: 0.9 10*3/uL (ref 0.1–1.0)
Monocytes Relative: 6 %
Neutro Abs: 9.3 10*3/uL — ABNORMAL HIGH (ref 1.7–7.7)
Neutrophils Relative %: 69 %
Platelets: 354 10*3/uL (ref 150–400)
RBC: 4.06 MIL/uL (ref 3.87–5.11)
RDW: 12.1 % (ref 11.5–15.5)
WBC: 13.7 10*3/uL — ABNORMAL HIGH (ref 4.0–10.5)
nRBC: 0 % (ref 0.0–0.2)

## 2023-03-10 LAB — CULTURE, BLOOD (ROUTINE X 2)
Culture: NO GROWTH
Culture: NO GROWTH
Special Requests: ADEQUATE
Special Requests: ADEQUATE

## 2023-03-10 LAB — GLUCOSE, CAPILLARY
Glucose-Capillary: 95 mg/dL (ref 70–99)
Glucose-Capillary: 171 mg/dL — ABNORMAL HIGH (ref 70–99)
Glucose-Capillary: 148 mg/dL — ABNORMAL HIGH (ref 70–99)

## 2023-03-10 LAB — RENAL FUNCTION PANEL
Albumin: 3.2 g/dL — ABNORMAL LOW (ref 3.5–5.0)
Anion gap: 8 (ref 5–15)
BUN: 14 mg/dL (ref 6–20)
CO2: 28 mmol/L (ref 22–32)
Calcium: 9.2 mg/dL (ref 8.9–10.3)
Chloride: 98 mmol/L (ref 98–111)
Creatinine, Ser: 0.59 mg/dL (ref 0.44–1.00)
GFR, Estimated: >60 mL/min (ref >60)
Glucose, Bld: 93 mg/dL (ref 70–99)
Phosphorus: 4 mg/dL (ref 2.5–4.6)
Potassium: 3.1 mmol/L — ABNORMAL LOW (ref 3.5–5.1)
Sodium: 134 mmol/L — ABNORMAL LOW (ref 135–145)

## 2023-03-10 LAB — MAGNESIUM: Magnesium: 2.3 mg/dL (ref 1.7–2.4)

## 2023-03-10 MED ORDER — AMOXICILLIN-POT CLAVULANATE 875-125 MG PO TABS
1.0000 | ORAL_TABLET | Freq: Two times a day (BID) | ORAL | 0 refills | Status: DC
  Filled 2023-03-10: qty 6, 3d supply, fill #0

## 2023-03-10 MED ORDER — POTASSIUM CHLORIDE CRYS ER 20 MEQ PO TBCR
40.0000 meq | EXTENDED_RELEASE_TABLET | Freq: Once | ORAL | Status: AC
  Administered 2023-03-10: 40 meq via ORAL
  Filled 2023-03-10: qty 2

## 2023-03-10 MED ORDER — ALBUTEROL SULFATE (2.5 MG/3ML) 0.083% IN NEBU
3.0000 mL | INHALATION_SOLUTION | RESPIRATORY_TRACT | 12 refills | Status: AC | PRN
  Filled 2023-03-10: qty 75, 5d supply, fill #0

## 2023-03-10 MED ORDER — IPRATROPIUM-ALBUTEROL 0.5-2.5 (3) MG/3ML IN SOLN
3.0000 mL | Freq: Three times a day (TID) | RESPIRATORY_TRACT | 0 refills | Status: DC
  Filled 2023-03-10: qty 360, 40d supply, fill #0
  Filled 2023-03-10: qty 270, 30d supply, fill #0

## 2023-03-10 MED ORDER — IPRATROPIUM-ALBUTEROL 0.5-2.5 (3) MG/3ML IN SOLN
3.0000 mL | Freq: Four times a day (QID) | RESPIRATORY_TRACT | 0 refills | Status: DC
  Filled 2023-03-10: qty 360, 30d supply, fill #0

## 2023-03-10 MED ORDER — SALINE SPRAY 0.65 % NA SOLN
1.0000 | Freq: Three times a day (TID) | NASAL | 0 refills | Status: AC
  Filled 2023-03-10: qty 88, fill #0

## 2023-03-10 MED ORDER — IPRATROPIUM-ALBUTEROL 0.5-2.5 (3) MG/3ML IN SOLN
3.0000 mL | Freq: Three times a day (TID) | RESPIRATORY_TRACT | Status: DC
Start: 2023-03-10 — End: 2023-03-10

## 2023-03-10 MED ORDER — SULFAMETHOXAZOLE-TRIMETHOPRIM 400-80 MG PO TABS
1.0000 | ORAL_TABLET | ORAL | 0 refills | Status: DC
  Filled 2023-03-10: qty 8, 18d supply, fill #0

## 2023-03-10 MED ORDER — POTASSIUM CHLORIDE ER 10 MEQ PO TBCR
20.0000 meq | EXTENDED_RELEASE_TABLET | Freq: Every day | ORAL | 0 refills | Status: DC
  Filled 2023-03-10: qty 20, 10d supply, fill #0

## 2023-03-10 NOTE — Plan of Care (Signed)
  Problem: Health Behavior/Discharge Planning: Goal: Ability to manage health-related needs will improve Outcome: Progressing   Problem: Clinical Measurements: Goal: Ability to maintain clinical measurements within normal limits will improve Outcome: Progressing Goal: Will remain free from infection Outcome: Progressing   

## 2023-03-10 NOTE — TOC Transition Note (Signed)
Transition of Care [facility]) - CM/SW Discharge Note   Patient Details  Name: Olivia [name] MRN: [id number] Date of Birth: [birth year]  [facility] CM/SW Contact:  Tom-Johnson, Hershal Coria, RN Phone Number: 03/10/2023, 4:14 PM   Clinical Narrative:     Patient is scheduled for discharge today.  Readmission Risk Assessment done. Outpatient f/u, hospital f/u and discharge instructions on AVS. Prescriptions sent to Olivia [name] and patient will pickup on her way home at discharge. No TOC needs or recommendations noted. Husband, Olivia [name] to bring portable O2 and transport at discharge.  No further TOC needs noted.            Final next level of care: Home/Self Care Barriers to Discharge: Barriers Resolved   Patient Goals and CMS Choice CMS Medicare.gov Compare Post Acute Care list provided to:: Patient Choice offered to / list presented to : Patient  Discharge Placement                  Patient to be transferred to facility by: Husband Name of family member notified: [facility] Additional resources added to the After Visit Summary for                  DME Arranged: N/A DME Agency: NA       HH Arranged: NA HH Agency: NA        Social Determinants of Health (SDOH) Interventions SDOH Screenings   Food Insecurity: No Food Insecurity (03/06/2023)  Housing: Low Risk  (03/06/2023)  Transportation Needs: No Transportation Needs (03/06/2023)  Utilities: Not At Risk (03/06/2023)  Depression (PHQ2-9): Low Risk  (11/12/2021)  Financial Resource Strain: High Risk (07/08/2021)  Tobacco Use: Low Risk  (03/05/2023)     Readmission Risk Interventions    02/20/2023    3:04 PM  Readmission Risk Prevention Plan  Post Dischage Appt Complete  Medication Screening Complete  Transportation Screening Complete

## 2023-03-10 NOTE — TOC CM/SW Note (Signed)
Transition of Care [facility]) - Inpatient Brief Assessment   Patient Details  Name: Olivia [name] MRN: [id number] Date of Birth: 04/08/1966  [facility]) CM/SW Contact:    Tom-Johnson, Hershal Coria, RN Phone Number: 03/10/2023, 11:50 AM   Clinical Narrative:  Patient presented to the ED with Desaturations. Recently discharged home on O2 @ 6L for increased O2 requirement. Currently on IV Lasix, Prednisone, Neb tx and last dose Augmentin scheduled for today. On Bactrim MWF schedule.  Palliative consulted for GOC.   No TOC needs or recommendations noted at this time.  Patient not Medically ready for discharge.  CM will continue to follow as patient progresses with care towards discharge.        Transition of Care Asessment:

## 2023-03-10 NOTE — Discharge Summary (Addendum)
Name: Olivia [name] MRN: [id number] DOB: August 14, 1965 57 y.o. PCP: Faith Rogue, DO  Date of Admission: 03/05/2023 10:06 AM Date of Discharge: 03/10/2023 6:26 PM Attending Physician: Dr. Heide Spark  Discharge Diagnosis: Principal Problem:   Acute on chronic hypoxic respiratory failure (HCC) Active Problems:   Pulmonary fibrosis (HCC)    Discharge Medications: Allergies as of 03/10/2023       Reactions   Miralax [polyethylene Glycol] Rash        Medication List     STOP taking these medications    albuterol 108 (90 Base) MCG/ACT inhaler Commonly known as: VENTOLIN HFA Replaced by: albuterol (2.5 MG/3ML) 0.083% nebulizer solution       TAKE these medications    albuterol (2.5 MG/3ML) 0.083% nebulizer solution Commonly known as: PROVENTIL Inhale 3 mLs into the lungs by nebulizer every 4 (four) hours as needed for wheezing or shortness of breath. Replaces: albuterol 108 (90 Base) MCG/ACT inhaler   amoxicillin-clavulanate 875-125 MG tablet Commonly known as: AUGMENTIN Take 1 tablet by mouth every 12 (twelve) hours.   benzonatate 100 MG capsule Commonly known as: TESSALON Take 1 capsule (100 mg total) by mouth 3 (three) times daily.   guaiFENesin-dextromethorphan 100-10 MG/5ML syrup Commonly known as: ROBITUSSIN DM Take 5 mLs by mouth every 4 (four) hours as needed for cough.   ipratropium-albuterol 0.5-2.5 (3) MG/3ML Soln Commonly known as: DUONEB Take 3 mLs by nebulization 3 (three) times daily.   pantoprazole 40 MG tablet Commonly known as: PROTONIX Tome 1 tableta (40 mg en total) por va oral diariamente. (Take 1 tablet (40 mg total) by mouth daily.)   potassium chloride 10 MEQ tablet Commonly known as: Klor-Con 10 Take 2 tablets (20 mEq total) by mouth daily for 10 doses.   predniSONE 20 MG tablet Commonly known as: DELTASONE Take 2 tablets (40 mg total) by mouth daily with breakfast.   sodium chloride 0.65 % Soln nasal spray Commonly  known as: OCEAN Place 1 spray into both nostrils 3 (three) times daily. What changed:  when to take this reasons to take this   sulfamethoxazole-trimethoprim 400-80 MG tablet Commonly known as: BACTRIM Take 1 tablet by mouth 3 (three) times a week for 8 doses.        Disposition and follow-up:   Olivia [name] was discharged from [facility] in Fair condition.  At the hospital follow up visit please address:  1.  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  Hypokalemia -Discharged with short course of Kclor 20 mEq  -Follow up BMP  2.  Labs / imaging needed at time of follow-up:  BMP, cbc   3.  Pending labs/ test needing follow-up: none  4.  Medication Changes  ADDED  -Prednisone  -Bactrim  -Albuterol nebulized treatment  -Duonebs  -Potassium  -Gabapentin for cough suppression per pulmonology  -Augmentin   Follow-up Appointments: 11/7 [facility] - Olegario Messier, MD 11/12 [facility] Course by problem list: Olivia [name] is a 57 y.o. who presented with worsening SOB with recent hospitalization for acute on chronic hypoxia secondary to her ILD and treated for pressumed aspiration pneumonia in fair condition to be discharged  from hospital.  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 Patient was treated for aspiration  pneumonia and supportive treatment similar to last hospitalization with slow improvement until day of discharge. During this admission, pulmonology evaluated patient and added bactrim prophylaxis to current treatment strategy given her prolonged requirement of high dose steroids. She was also evaluated by palliative care for goals of care conversation and insight into her disease process given the limitations in treatment and her progression of disease. Patient remains full code and amenable to full scope of care. She is hopeful for recovery. Patient is to continue prednisone therapy and PJP prophylaxis at least until seen by [facility] pulmonology. Of note, patient has been responding to Gabapentin to aid with cough paroxysms and to nebulized treatments after exertion. Patient was discharged after demonstrating proper O2 saturations on current 6L [place].  *Hypokalemia Patient was hypokalemic secondary to high doses of B2 agonist during this admission. She was discharged with low dose K supplementation and would benefit of BMP re-evaluation at discharge. Supplement as needed.  *Dyspepsia Continued on PPI  Discharge Subjective: Patient feeling much better today. She reports she feels as well as she did when she left the hospital after the most recent hospitalizations. Improvement in her mobility and stability in supplemental O2 around the bedroom. No worsening of cough or dyspnea  Discharge Exam:   Blood pressure (!) 157/80 pulse (!) 100, temperature 98.2 F (36.8 C), temperature source Oral, resp. rate 20, height 4\' 11"  (1.499 m), weight 63 kg, SpO2 97%.  Constitutional: chronically ill appearing woman in no apparent distress HENT: normocephalic atraumatic, mucous membranes moist Cardiovascular: regular Pulmonary/Chest: Muscle retraction of 6L supplemental O2, speaking in full sentences. Coarse and fine crackles bilaterally Abdominal: soft, non-tender, non-distended.  Neurological: alert & oriented x  3 MSK: no gross abnormalities. Clubbing of the fingernails Skin: warm and dry Psych: Normal mood and affect  Pertinent Labs, Studies, and Procedures:     Latest Ref Rng & Units 03/10/2023    4:33 AM 03/09/2023    4:35 AM 03/08/2023    4:14 AM  CBC  WBC 4.0 - 10.5 K/uL 13.7  9.8  11.2   Hemoglobin 12.0 - 15.0 g/dL 14.7  82.9  56.2   Hematocrit 36.0 - 46.0 % 37.1  36.9  35.5   Platelets 150 - 400 K/uL 354  342  333        Latest Ref Rng & Units 03/10/2023    4:33 AM 03/09/2023    5:21 PM 03/09/2023    4:35 AM  CMP  Glucose 70 - 99 mg/dL 93  130  865   BUN 6 - 20 mg/dL 14  14  12    Creatinine 0.44 - 1.00 mg/dL 7.84  6.96  2.95   Sodium 135 - 145 mmol/L 134  134  135   Potassium 3.5 - 5.1 mmol/L 3.1  4.3  3.6   Chloride 98 - 111 mmol/L 98  101  100   CO2 22 - 32 mmol/L 28  21  25    Calcium 8.9 - 10.3 mg/dL 9.2  9.1  9.1     DG Chest 2 View  Result Date: 03/05/2023 CLINICAL DATA:  Shortness of breath. EXAM: CHEST - 2 VIEW COMPARISON:  Chest radiograph dated 02/26/2023. FINDINGS: Stable mild  cardiomegaly. Low lung volumes. Bilateral chronic interstitial changes with slightly increased hazy airspace opacities in the left and right midlung zones. No pneumothorax or pleural effusion. No acute osseous abnormality. IMPRESSION: Slightly increased hazy airspace opacities in the bilateral midlung zones is suggestive of infection or pulmonary edema superimposed on chronic interstitial lung disease. Electronically Signed   By: Hart Robinsons M.D.   On: 03/05/2023 12:09     Discharge Instructions: Discharge Instructions     Call MD for:  difficulty breathing, headache or visual disturbances   Complete by: As directed    Call MD for:  extreme fatigue   Complete by: As directed    Call MD for:  hives   Complete by: As directed    Call MD for:  persistant dizziness or light-headedness   Complete by: As directed    Call MD for:  persistant nausea and vomiting   Complete by: As directed     Call MD for:  redness, tenderness, or signs of infection (pain, swelling, redness, odor or green/yellow discharge around incision site)   Complete by: As directed    Call MD for:  severe uncontrolled pain   Complete by: As directed    Call MD for:  temperature >100.4   Complete by: As directed    Diet - low sodium heart healthy   Complete by: As directed    Discharge instructions   Complete by: As directed    She is to continue on the following until she follows up with PCCM:  Clayborn Heron:  -cw 40mg  prednisone daily -Continue taking TMX-SMP for PCP ppx on MWF as long as prednisone is >20mg  daily  -cw tessalon pearls, Robitussin, chlorseptic spray  -cw gabapentin 300mg  BID  -cw Augmentin for 3 doses  -cw Pantoprazole 40mg  daily  -cw albuterol q4hrs PRN and duonebs as needed  Please consider repeating a CBC and RFP at her next follow up appointment  Please ensure that she has followed up with pulmonology Please ensure that the has gotten her HR CT before she sees pulmonology  Please ensure adherence to treatment and modify accordingly if needed during follow-up.  Follow up with [facility] in the clinic     Olivia [name],   Gracias por permitirnos tomar cuidado de usted durante su estadia en el hospital. Odelia Gage hospitalizada porque tiene fibrosis pulmonar. Le estado dando de alta con el siguiente regimen hasta que vea al pulmonologo el 11/12:   -Continue tomando prednisona 40mg  diariamente  -Continue tomando Bactrim los lunes, miercoles y viernes cada semana (este remedio ayuda a prevenir infecciones que son asociadas con el uso de la prednisona a Air cabin crew)   -Continue tomando Augmentin:  --03/10/2023: Neomia Dear tableta esta noche --11/5, Tuesday, Una tableta en la manana y una tableta en la noche  -Continue tomando Pantoprazole or Omeprazole: una tableta diaria -Continue con el sistema de nebulizaciones cuando sea necesario; ese medicament se llama ipratropium-albuterol  -haga las nebulizaciones 3 veces al dia Albuterol-proventil - usela  cada 4 horas SOLO cuando necesario -Gabapentina 300mg  dos veces al dia  -Robitussin cada 4 horas cuando sea necesario  -Pildoras de Benzonatate (Tessalon pearls) tres veces al dia  -Continue usando su aparto de inspiracion para ayudarle a abrir Education officer, environmental -Potasio - tome una pildora de 20 meq  (dos tabletas) una vez al dia  los dias que use nebulizacion hasta que vea al doctor en la clinica  -Para la sequedad en su Darene Lamer, utilize la solucion de salina en  aerosol cuando sea necesario  Por favor obtenga su tomografia el 11/5 y vea al pulmonologo el 11/12.   Por favor venga a la clinica de medicina interna el 11/7 para seguimiento del hospital.   Sinceramente, Morene Crocker, MD   Increase activity slowly   Complete by: As directed        Signed: Morene Crocker, MD [facility] - PGY2 Pager: [phone] 03/10/2023, 6:26 PM    Please contact the on call pager after 5 pm and on weekends at [phone].

## 2023-03-10 NOTE — Progress Notes (Signed)
Physical Therapy Treatment Patient Details Name: Olivia [name] MRN: [id number] DOB: [birth year] Today's Date: 03/10/2023   History of Present Illness Pt is a 57 y/o F admitted on 03/05/23 after presenting with c/o O2 desaturations while ambulating, actively worsening overnight. Pt is being treated for acute on chronic respiratory failure.  PMH: ILD, GERD, hiatal hernia, recent admission for acute on chronic respiratory failure 2/2 ILD    PT Comments  Continuing work on functional mobility and activity tolerance;  Session focused on getting pt's questions answered in prep for possible dc today; Overall Olivia [name] is moving well, steady on her feet; she does desat quickly on room air (see previous RN note); We briefly discussed use of a rollator RW to give pt a safe option for sitting if DOE increases, and she needs to rest; pt declines rollator rw at this time; Pt asking about getting a chest PT vest, based on MD recommendation; At time of discussion, I didn't see arrangements being made here for a chest PT vest (and I'm not sure how often this is arranged here at the hospital; Emphasized to PT to follow up with her pulmonologist if we are unable to get her one here;   Rayburn Ma, video Medical Spanish Interpereter [phone]) facilitated communication    If plan is discharge home, recommend the following: Assistance with cooking/housework;Assist for transportation;Help with stairs or ramp for entrance   Can travel by private vehicle        Equipment Recommendations   (While a rollator can be helpful for pt's with pulmonary disease, pt does not want one at this time)    Recommendations for Other Services       Precautions / Restrictions Precautions Precautions: None Precaution Comments: monitor O2 Restrictions Weight Bearing Restrictions: No     Mobility  Bed Mobility Overal bed mobility: Modified Independent Bed Mobility: Supine to Sit     Supine to sit: Modified independent  (Device/Increase time), HOB elevated, Used rails     General bed mobility comments: No difficulty    Transfers Overall transfer level: Modified independent Equipment used: None Transfers: Sit to/from Stand, Bed to chair/wheelchair/BSC Sit to Stand: Modified independent (Device/Increase time)           General transfer comment: No difficulty standing from bed    Ambulation/Gait Ambulation/Gait assistance: Modified independent (Device/Increase time)             General Gait Details: marched in place at EOB; no difficulty, but noteworthy incr work of Art gallery manager Bed    Modified Rankin (Stroke Patients Only)       Balance     Sitting balance-Leahy Scale: Good       Standing balance-Leahy Scale: Good                              Cognition Arousal: Alert Behavior During Therapy: WFL for tasks assessed/performed Overall Cognitive Status: Within Functional Limits for tasks assessed                                          Exercises      General Comments General comments (skin integrity, edema, etc.): Pt on 6 L supplemental O2 for majority of session; briefly took  supplemental O2 off, and O2 sats decr to 88%, coupled with DOE      Pertinent Vitals/Pain Pain Assessment Pain Assessment: Faces Pain Score: 0-No pain Pain Intervention(s): Monitored during session    Home Living                          Prior Function            PT Goals (current goals can now be found in the care plan section) Acute Rehab PT Goals Patient Stated Goal: get better PT Goal Formulation: With patient Time For Goal Achievement: 03/14/23 Potential to Achieve Goals: Fair Progress towards PT goals: Progressing toward goals    Frequency    Min 1X/week      PT Plan      Co-evaluation              AM-PAC PT "6 Clicks" Mobility   Outcome Measure  Help needed  turning from your back to your side while in a flat bed without using bedrails?: None Help needed moving from lying on your back to sitting on the side of a flat bed without using bedrails?: None Help needed moving to and from a bed to a chair (including a wheelchair)?: None Help needed standing up from a chair using your arms (e.g., wheelchair or bedside chair)?: None Help needed to walk in hospital room?: A Little Help needed climbing 3-5 steps with a railing? : A Little 6 Click Score: 22    End of Session Equipment Utilized During Treatment: Oxygen Activity Tolerance: Patient tolerated treatment well Patient left: in bed;with call bell/phone within reach;Other (comment) (readying for getting home) Nurse Communication: Mobility status PT Visit Diagnosis: Other abnormalities of gait and mobility (R26.89)     Time: 1300-1320 PT Time Calculation (min) (ACUTE ONLY): 20 min  Charges:    $Therapeutic Activity: 8-22 mins PT General Charges $$ ACUTE PT VISIT: 1 Visit                     Van Clines, PT  Acute Rehabilitation Services Office [phone] Secure Chat welcomed    Olivia [name] 03/10/2023, 3:34 PM

## 2023-03-10 NOTE — Progress Notes (Signed)
Patient walked length of hall on 6L some shortness of breath but o2 stayed above 90  in room standing on room air  O2 dropped to 88

## 2023-03-11 ENCOUNTER — Telehealth: Payer: Self-pay

## 2023-03-11 ENCOUNTER — Ambulatory Visit (HOSPITAL_COMMUNITY)
Admission: RE | Admit: 2023-03-11 | Discharge: 2023-03-11 | Disposition: A | Discharge status: Home / Self Care | Payer: No Typology Code available for payment source | Source: Ambulatory Visit | Attending: Pulmonary Disease | Admitting: Pulmonary Disease

## 2023-03-11 ENCOUNTER — Other Ambulatory Visit (HOSPITAL_COMMUNITY): Payer: Self-pay

## 2023-03-11 DIAGNOSIS — J849 Interstitial pulmonary disease, unspecified: Secondary | ICD-10-CM | POA: Insufficient documentation

## 2023-03-11 NOTE — Transitions of Care (Post Inpatient/ED Visit) (Signed)
03/11/2023  Name: Olivia [name] MRN: [id number] DOB: [birth year]  Today's TOC FU Call Status: Today's TOC FU Call Status:: Successful TOC FU Call Completed TOC FU Call Complete Date: 03/11/23 Patient's Name and Date of Birth confirmed.  Transition Care Management Follow-up Telephone Call Date of Discharge: 03/10/23 Discharge Facility: [facility]) Type of Discharge: Inpatient Admission Primary Inpatient Discharge Diagnosis:: hypoxia How have you been since you were released from the hospital?: Better Any questions or concerns?: No  Items Reviewed: Did you receive and understand the discharge instructions provided?: Yes Medications obtained,verified, and reconciled?: Yes (Medications Reviewed) Any new allergies since your discharge?: No Dietary orders reviewed?: Yes Do you have support at home?: Yes People in Home: spouse  Medications Reviewed Today: Medications Reviewed Today     Reviewed by Karena Addison, LPN (Licensed Practical Nurse) on 03/11/23 at 0910  Med List Status: <None>   Medication Order Taking? Sig Documenting Provider Last Dose Status Informant  albuterol (PROVENTIL) (2.5 MG/3ML) 0.083% nebulizer solution [id number]  Inhale 3 mLs into the lungs by nebulizer every 4 (four) hours as needed for wheezing or shortness of breath. Morene Crocker, MD  Active   amoxicillin-clavulanate (AUGMENTIN) 875-125 MG tablet [id number]  Take 1 tablet by mouth every 12 (twelve) hours. Morene Crocker, MD  Active   benzonatate (TESSALON) 100 MG capsule [id number] No Take 1 capsule (100 mg total) by mouth 3 (three) times daily. Hassan Rowan Washington, MD 03/04/2023 Active Self, Family Member, Pharmacy Records  guaiFENesin-dextromethorphan [facility] DM) 100-10 MG/5ML syrup [id number] No Take 5 mLs by mouth every 4 (four) hours as needed for cough. Hassan Rowan Washington, MD 03/04/2023 Active Self, Family Member, Pharmacy Records  ipratropium-albuterol  (DUONEB) 0.5-2.5 (3) MG/3ML SOLN [id number]  Take 3 mLs by nebulization 3 (three) times daily. Morene Crocker, MD  Active   pantoprazole (PROTONIX) 40 MG tablet [id number] No Take 1 tablet (40 mg total) by mouth daily. Hassan Rowan Washington, MD 03/04/2023 Active Self, Family Member, Pharmacy Records  potassium chloride (KLOR-CON 10) 10 MEQ tablet [id number]  Take 2 tablets (20 mEq total) by mouth daily for 10 doses. Morene Crocker, MD  Active   predniSONE (DELTASONE) 20 MG tablet [id number] No Take 2 tablets (40 mg total) by mouth daily with breakfast. [name], MD 03/04/2023 Active Self, Family Member, Pharmacy Records  sodium chloride (OCEAN) 0.65 % SOLN nasal spray [id number]  Place 1 spray into both nostrils 3 (three) times daily. Morene Crocker, MD  Active   sulfamethoxazole-trimethoprim (BACTRIM) 400-80 MG tablet [id number]  Take 1 tablet by mouth 3 (three) times a week for 8 doses. Morene Crocker, MD  Active             Home Care and Equipment/Supplies: Were Home Health Services Ordered?: NA Any new equipment or medical supplies ordered?: NA  Functional Questionnaire: Do you need assistance with bathing/showering or dressing?: No Do you need assistance with meal preparation?: No Do you need assistance with eating?: No Do you have difficulty maintaining continence: No Do you need assistance with getting out of bed/getting out of a chair/moving?: No Do you have difficulty managing or taking your medications?: No  Follow up appointments reviewed: PCP Follow-up appointment confirmed?: Yes Date of PCP follow-up appointment?: 03/13/23 Follow-up Provider: [facility] Follow-up appointment confirmed?: Yes Date of Specialist follow-up appointment?: 03/18/23 Follow-Up Specialty Provider:: pulmonary Do you need transportation to your follow-up appointment?: No Do you understand care options if your condition(s) worsen?:  Yes-patient verbalized understanding    SIGNATURE Karena Addison, LPN  [facility] Nurse Health Advisor Direct Dial [phone]

## 2023-03-13 ENCOUNTER — Encounter: Payer: Self-pay | Admitting: Student

## 2023-03-13 ENCOUNTER — Other Ambulatory Visit (HOSPITAL_COMMUNITY): Payer: Self-pay

## 2023-03-13 ENCOUNTER — Ambulatory Visit (INDEPENDENT_AMBULATORY_CARE_PROVIDER_SITE_OTHER): Payer: Self-pay | Admitting: Student

## 2023-03-13 VITALS — BP 103/70 | HR 76 | Temp 98.4°F | Ht 59.0 in | Wt 134.1 lb

## 2023-03-13 DIAGNOSIS — J9621 Acute and chronic respiratory failure with hypoxia: Secondary | ICD-10-CM

## 2023-03-13 DIAGNOSIS — E876 Hypokalemia: Secondary | ICD-10-CM | POA: Insufficient documentation

## 2023-03-13 MED ORDER — SULFAMETHOXAZOLE-TRIMETHOPRIM 400-80 MG PO TABS
1.0000 | ORAL_TABLET | ORAL | 3 refills | Status: DC
  Filled 2023-03-13: qty 90, 210d supply, fill #0
  Filled 2023-03-31: qty 13, 90d supply, fill #0
  Filled 2023-05-12: qty 13, 30d supply, fill #1
  Filled 2023-06-17: qty 13, 30d supply, fill #2
  Filled 2023-07-17: qty 13, 30d supply, fill #3
  Filled 2023-08-13: qty 13, 30d supply, fill #4
  Filled 2023-08-28: qty 20, 47d supply, fill #4

## 2023-03-13 MED ORDER — BENZONATATE 100 MG PO CAPS
100.0000 mg | ORAL_CAPSULE | Freq: Three times a day (TID) | ORAL | 0 refills | Status: DC
  Filled 2023-03-13: qty 20, 7d supply, fill #0

## 2023-03-13 MED ORDER — PREDNISONE 20 MG PO TABS
40.0000 mg | ORAL_TABLET | Freq: Every day | ORAL | 3 refills | Status: DC
  Filled 2023-03-13: qty 60, 30d supply, fill #0
  Filled 2023-03-31: qty 60, 30d supply, fill #1

## 2023-03-13 NOTE — Assessment & Plan Note (Addendum)
Patient presents for follow-up for hospitalization of acute on chronic hypoxic respiratory failure in the setting of aspiration pneumonia, with underlying severe interstitial lung disease.  She is currently on her home 6 L of oxygen, recent CT scan on November 5 that showed worsening fibrosis of her lungs.  She is currently taking her prednisone 40 mg daily, as well as Bactrim Monday Wednesday Friday for PCP prophylaxis.  She has received her flu vaccine, as well as her pneumonia vaccine.  Discussed COVID booster with her, and she is not interested at this time.  Since her hospitalization, she has finished her course of Augmentin.  She states overall, she is feeling better she feels like she is getting stronger.  She has follow-up with pulmonology on March 18, 2023 for further evaluation.  Plan: - Continue prednisone 40 mg daily - Continue Bactrim Monday Wednesday Friday - Will follow-up with pulm on March 18, 2023 - Will refill benzonatate

## 2023-03-13 NOTE — Progress Notes (Signed)
CC: Hospital follow-up  HPI:  Olivia [name] is a 57 y.o. female living with a history stated below and presents today for hospital follow-up. Please see problem based assessment and plan for additional details.  Past Medical History:  Diagnosis Date   Acute respiratory distress 03/27/2020   Elevated blood pressure reading 12/21/2018   GERD (gastroesophageal reflux disease)    Healthcare maintenance 06/28/2019   Per patient's recent discharge summary, FOBT positive in ED. Given her overall prognosis and health condition, will hold off discussing colonoscopy.   History of COVID-19 09/02/2019   05/03/19 - [place] - positive  Did not get mab  Did not get hosp   Preventative health care 01/15/2013   Pulmonary fibrosis (HCC)    Screening breast examination 11/26/2018    Current Outpatient Medications on File Prior to Visit  Medication Sig Dispense Refill   albuterol (PROVENTIL) (2.5 MG/3ML) 0.083% nebulizer solution Inhale 3 mLs into the lungs by nebulizer every 4 (four) hours as needed for wheezing or shortness of breath. 75 mL 12   amoxicillin-clavulanate (AUGMENTIN) 875-125 MG tablet Take 1 tablet by mouth every 12 (twelve) hours. 6 tablet 0   guaiFENesin-dextromethorphan (ROBITUSSIN DM) 100-10 MG/5ML syrup Take 5 mLs by mouth every 4 (four) hours as needed for cough. 118 mL 0   ipratropium-albuterol (DUONEB) 0.5-2.5 (3) MG/3ML SOLN Take 3 mLs by nebulization 3 (three) times daily. 360 mL 0   pantoprazole (PROTONIX) 40 MG tablet Take 1 tablet (40 mg total) by mouth daily. 30 tablet 0   potassium chloride (KLOR-CON 10) 10 MEQ tablet Take 2 tablets (20 mEq total) by mouth daily for 10 doses. 20 tablet 0   sodium chloride (OCEAN) 0.65 % SOLN nasal spray Place 1 spray into both nostrils 3 (three) times daily. 88 mL 0   No current facility-administered medications on file prior to visit.    Family History  Problem Relation Age of Onset   Diabetes Father    Hypertension Father     Asthma Sister    Diabetes Paternal Grandfather    Breast cancer Neg Hx     Social History   Socioeconomic History   Marital status: Married    Spouse name: Not on file   Number of children: 3   Years of education: Not on file   Highest education level: 9th grade  Occupational History   Not on file  Tobacco Use   Smoking status: Never    Passive exposure: Never   Smokeless tobacco: Never  Vaping Use   Vaping status: Never Used  Substance and Sexual Activity   Alcohol use: Not Currently   Drug use: No   Sexual activity: Not Currently    Birth control/protection: Post-menopausal  Other Topics Concern   Not on file  Social History Narrative   Native of [place], [place]   Husband, [place], alcoholic   3 children, Oldest son in [place]   Grandchildren, 3         Social Determinants of Health   Financial Resource Strain: High Risk (07/08/2021)   Overall Financial Resource Strain (CARDIA)    Difficulty of Paying Living Expenses: Very hard  Food Insecurity: No Food Insecurity (03/06/2023)   Hunger Vital Sign    Worried About Running Out of Food in the Last Year: Never true    Ran Out of Food in the Last Year: Never true  Transportation Needs: No Transportation Needs (03/06/2023)   PRAPARE - Administrator, Civil Service (Medical):  No    Lack of Transportation (Non-Medical): No  Physical Activity: Not on file  Stress: Not on file  Social Connections: Not on file  Intimate Partner Violence: Not At Risk (03/06/2023)   Humiliation, Afraid, Rape, and Kick questionnaire    Fear of Current or Ex-Partner: No    Emotionally Abused: No    Physically Abused: No    Sexually Abused: No    Review of Systems: ROS negative except for what is noted on the assessment and plan.  Vitals:   03/13/23 1320  BP: 103/70  Pulse: 76  Temp: 98.4 F (36.9 C)  TempSrc: Oral  SpO2: 100%  Weight: 134 lb 1.6 oz (60.8 kg)  Height: 4\' 11"  (1.499 m)    Physical  Exam: Constitutional: Female  in no acute distress HENT: normocephalic atraumatic, mucous membranes moist, nasal cannula in place Eyes: conjunctiva non-erythematous Neck: supple Cardiovascular: regular rate and rhythm, no m/r/g Pulmonary/Chest: normal work of breathing on 6L [place], course lung sounds heard bilaterally   Assessment & Plan:   Acute on chronic respiratory failure with hypoxia (HCC) Patient presents for follow-up for hospitalization of acute on chronic hypoxic respiratory failure in the setting of aspiration pneumonia, with underlying severe interstitial lung disease.  She is currently on her home 6 L of oxygen, recent CT scan on November 5 that showed worsening fibrosis of her lungs.  She is currently taking her prednisone 40 mg daily, as well as Bactrim Monday Wednesday Friday for PCP prophylaxis.  She has received her flu vaccine, as well as her pneumonia vaccine.  Discussed COVID booster with her, and she is not interested at this time.  Since her hospitalization, she has finished her course of Augmentin.  She states overall, she is feeling better she feels like she is getting stronger.  She has follow-up with pulmonology on March 18, 2023 for further evaluation.  Plan: - Continue prednisone 40 mg daily - Continue Bactrim Monday Wednesday Friday - Will follow-up with pulm on March 18, 2023 - Will refill benzonatate  Hypokalemia Patient with a potassium of 3.1 on discharge, she was discharged with 20 mEq of potassium pills.  Will recheck today and determine if potassium supplementation is still necessary.  Patient discussed with Dr. Lanelle Bal Marrissa Dai, M.D. [facility] Medicine, PGY-2 Pager: [phone] Date 03/13/2023 Time 2:11 PM

## 2023-03-13 NOTE — Assessment & Plan Note (Signed)
Patient with a potassium of 3.1 on discharge, she was discharged with 20 mEq of potassium pills.  Will recheck today and determine if potassium supplementation is still necessary.

## 2023-03-13 NOTE — Patient Instructions (Signed)
Thank you so much for coming to the clinic today!   I will refill your medications, please attend you pulmonology appointment on 03/18/23. I will work on figuring out the process to get a note for your son.    If you have any questions please feel free to the call the clinic at anytime at [phone]. It was a pleasure seeing you!  Best, Dr. Thomasene Ripple

## 2023-03-14 LAB — BMP8+ANION GAP
Anion Gap: 15 mmol/L (ref 10.0–18.0)
BUN/Creatinine Ratio: 14 (ref 9–23)
BUN: 7 mg/dL (ref 6–24)
CO2: 25 mmol/L (ref 20–29)
Calcium: 9 mg/dL (ref 8.7–10.2)
Chloride: 100 mmol/L (ref 96–106)
Creatinine, Ser: 0.49 mg/dL — ABNORMAL LOW (ref 0.57–1.00)
Glucose: 125 mg/dL — ABNORMAL HIGH (ref 70–99)
Potassium: 4 mmol/L (ref 3.5–5.2)
Sodium: 140 mmol/L (ref 134–144)
eGFR: 110 mL/min/{1.73_m2} (ref >59)

## 2023-03-18 ENCOUNTER — Ambulatory Visit (INDEPENDENT_AMBULATORY_CARE_PROVIDER_SITE_OTHER): Payer: No Typology Code available for payment source | Admitting: Primary Care

## 2023-03-18 ENCOUNTER — Encounter (INDEPENDENT_AMBULATORY_CARE_PROVIDER_SITE_OTHER): Payer: Self-pay | Admitting: Pulmonary Disease

## 2023-03-18 ENCOUNTER — Ambulatory Visit: Payer: No Typology Code available for payment source

## 2023-03-18 ENCOUNTER — Telehealth: Payer: Self-pay | Admitting: Primary Care

## 2023-03-18 ENCOUNTER — Other Ambulatory Visit (HOSPITAL_COMMUNITY): Payer: Self-pay

## 2023-03-18 ENCOUNTER — Encounter: Payer: Self-pay | Admitting: Primary Care

## 2023-03-18 VITALS — BP 116/74 | HR 105 | Ht <= 58 in | Wt 136.4 lb

## 2023-03-18 DIAGNOSIS — J9611 Chronic respiratory failure with hypoxia: Secondary | ICD-10-CM

## 2023-03-18 DIAGNOSIS — J849 Interstitial pulmonary disease, unspecified: Secondary | ICD-10-CM

## 2023-03-18 DIAGNOSIS — Z7952 Long term (current) use of systemic steroids: Secondary | ICD-10-CM

## 2023-03-18 DIAGNOSIS — R0600 Dyspnea, unspecified: Secondary | ICD-10-CM

## 2023-03-18 LAB — BASIC METABOLIC PANEL
BUN: 13 mg/dL (ref 6–23)
CO2: 26 meq/L (ref 19–32)
Calcium: 9.5 mg/dL (ref 8.4–10.5)
Chloride: 102 meq/L (ref 96–112)
Creatinine, Ser: 0.45 mg/dL (ref 0.40–1.20)
GFR: 106.62 mL/min (ref >60.00)
Glucose, Bld: 118 mg/dL — ABNORMAL HIGH (ref 70–99)
Potassium: 4.1 meq/L (ref 3.5–5.1)
Sodium: 136 meq/L (ref 135–145)

## 2023-03-18 MED ORDER — MYCOPHENOLATE MOFETIL 500 MG PO TABS
500.0000 mg | ORAL_TABLET | Freq: Two times a day (BID) | ORAL | 1 refills | Status: DC
  Filled 2023-03-18: qty 60, 30d supply, fill #0

## 2023-03-18 NOTE — Telephone Encounter (Signed)
Starting patient on OFEV 100mg  twice daily. She did not tolerate Esbriet. She also will be started on cellcept 500mg  twice daily.   Do need to to send order or will you guys

## 2023-03-18 NOTE — Patient Instructions (Addendum)
Recommendations: Continue prednisone 40mg  daily until follow-up Continue Bactrim MWF until follow-up Start OFEV 100mg  twice daily (different antifibrotic, lower dose) Start Cellcept 500mg  twiced daily (we will slowly taper dose up as tolerates) Needs labs in 4-6 weeks at follow-up   Orders: CXR TODAY Labs TODAY Handicap placard today   Follow-up 4-6 weeks with Dr. Isaiah Serge (only him) - if nothing open please contact him to see when he can see her

## 2023-03-18 NOTE — Progress Notes (Signed)
Please let patient know CXR showed improvement from prior exam, no new findings. Chronic interstitial lung disease (may need to call with translator)

## 2023-03-18 NOTE — Progress Notes (Signed)
@Patient  ID: Olivia [name], female    DOB: 1965-12-12, 57 y.o.   MRN: [id number]  Chief Complaint  Patient presents with   Follow-up    Referring provider: Faith Rogue, DO  HPI: 57 year old female, never smoked.  Past medical history significant for nonspecific interstitial pneumonia, chronic respiratory failure, PVD, GERD, dyslipidemia, unintentional weight loss.  03/18/2023 Discussed the use of AI scribe software for clinical note transcription with the patient, who gave verbal consent to proceed.  History of Present Illness   Spanish speak, accompanied by family member and translator   Patient of Dr. Isaiah Serge, last seen on 10/10/2022.    The patient, with a known history of interstitial lung disease or pulmonary fibrosis, presents for follow-up. The fibrosis was first identified in 2020 and has since progressed. The patient was previously on an antifibrotic medication, pirfenidone (Esbriet), but discontinued it due to severe gastrointestinal side effects, including significant weight loss and loss of appetite. The patient also reported difficulty in hand movements and generalized body pain while on the medication.  She was recently hospitalized for respiratory failure and pulmonary fibrosis. She was discharged on high dose prednisone and Bactrim. Post-discharge, the patient reports feeling better overall, but note a decrease in oxygen saturation when walking. The patient has also requested a mask for oxygen delivery instead of a nasal cannula, due to frequent nosebleeds despite using a humidifier and nasal spray. Additionally, the patient has requested an extension for her special parking permit due to mobility issues.   Her case was presented at ILD conference today, diagnosis with progressive fibrotic NSIP. Recommendations were to restart antifibrotics and start immunosuppressant medication such as CellCept.     Allergies  Allergen Reactions   Miralax [Polyethylene Glycol]  Rash    Immunization History  Administered Date(s) Administered   Influenza, Seasonal, Injecte, Preservative Fre 02/20/2023   Influenza,inj,Quad PF,6+ Mos 03/20/2018, 06/28/2019, 02/22/2020, 02/12/2021   PFIZER(Purple Top)SARS-COV-2 Vaccination 09/24/2019, 10/15/2019   PNEUMOCOCCAL CONJUGATE-20 02/20/2023   Td 05/06/1996   Tdap 01/15/2013    Past Medical History:  Diagnosis Date   Acute respiratory distress 03/27/2020   Elevated blood pressure reading 12/21/2018   GERD (gastroesophageal reflux disease)    Healthcare maintenance 06/28/2019   Per patient's recent discharge summary, FOBT positive in ED. Given her overall prognosis and health condition, will hold off discussing colonoscopy.   History of COVID-19 09/02/2019   05/03/19 - [place] - positive  Did not get mab  Did not get hosp   Preventative health care 01/15/2013   Pulmonary fibrosis (HCC)    Screening breast examination 11/26/2018    Tobacco History: Social History   Tobacco Use  Smoking Status Never   Passive exposure: Never  Smokeless Tobacco Never   Counseling given: Not Answered   Outpatient Medications Prior to Visit  Medication Sig Dispense Refill   albuterol (PROVENTIL) (2.5 MG/3ML) 0.083% nebulizer solution Inhale 3 mLs into the lungs by nebulizer every 4 (four) hours as needed for wheezing or shortness of breath. 75 mL 12   benzonatate (TESSALON) 100 MG capsule Take 1 capsule (100 mg total) by mouth 3 (three) times daily. 20 capsule 0   guaiFENesin-dextromethorphan (ROBITUSSIN DM) 100-10 MG/5ML syrup Take 5 mLs by mouth every 4 (four) hours as needed for cough. 118 mL 0   ipratropium-albuterol (DUONEB) 0.5-2.5 (3) MG/3ML SOLN Take 3 mLs by nebulization 3 (three) times daily. 360 mL 0   pantoprazole (PROTONIX) 40 MG tablet Take 1 tablet (40 mg total) by mouth daily.  30 tablet 0   potassium chloride (KLOR-CON 10) 10 MEQ tablet Take 2 tablets (20 mEq total) by mouth daily for 10 doses. 20 tablet 0    predniSONE (DELTASONE) 20 MG tablet Take 2 tablets (40 mg total) by mouth daily with breakfast. 90 tablet 3   sodium chloride (OCEAN) 0.65 % SOLN nasal spray Place 1 spray into both nostrils 3 (three) times daily. 88 mL 0   sulfamethoxazole-trimethoprim (BACTRIM) 400-80 MG tablet Take 1 tablet by mouth 3 (three) times a week. 90 tablet 3   amoxicillin-clavulanate (AUGMENTIN) 875-125 MG tablet Take 1 tablet by mouth every 12 (twelve) hours. 6 tablet 0   No facility-administered medications prior to visit.   Review of Systems  Review of Systems  Respiratory:  Positive for shortness of breath. Negative for cough.    Physical Exam  BP 116/74 (BP Location: Left Arm, Cuff Size: Normal)   Pulse (!) 105   Ht 4\' 9"  (1.448 m)   Wt 136 lb 6.4 oz (61.9 kg)   LMP  (LMP Unknown)   SpO2 98% Comment: After walkint from the waiting to the room, the patients saturations were 85% on 6L continues. Had to bump up to 8L continuous for saturations to get up to 98%  BMI 29.52 kg/m  Physical Exam Constitutional:      Appearance: Normal appearance.  HENT:     Head: Normocephalic and atraumatic.  Cardiovascular:     Rate and Rhythm: Normal rate and regular rhythm.  Pulmonary:     Breath sounds: Rales present.     Comments: Rales bilateral lung bases  Musculoskeletal:        General: Normal range of motion.  Skin:    General: Skin is warm and dry.  Neurological:     General: No focal deficit present.     Mental Status: She is alert and oriented to person, place, and time. Mental status is at baseline.  Psychiatric:        Mood and Affect: Mood normal.        Behavior: Behavior normal.        Thought Content: Thought content normal.        Judgment: Judgment normal.      Lab Results:  CBC    Component Value Date/Time   WBC 13.7 (H) 03/10/2023 0433   RBC 4.06 03/10/2023 0433   HGB 12.6 03/10/2023 0433   HGB 13.8 12/26/2016 1643   HCT 37.1 03/10/2023 0433   HCT 41.1 12/26/2016 1643   PLT  354 03/10/2023 0433   PLT 268 12/26/2016 1643   MCV 91.4 03/10/2023 0433   MCV 89 12/26/2016 1643   MCH 31.0 03/10/2023 0433   MCHC 34.0 03/10/2023 0433   RDW 12.1 03/10/2023 0433   RDW 13.0 12/26/2016 1643   LYMPHSABS 2.9 03/10/2023 0433   MONOABS 0.9 03/10/2023 0433   EOSABS 0.5 03/10/2023 0433   BASOSABS 0.0 03/10/2023 0433    BMET    Component Value Date/Time   NA 140 03/13/2023 1405   K 4.0 03/13/2023 1405   CL 100 03/13/2023 1405   CO2 25 03/13/2023 1405   GLUCOSE 125 (H) 03/13/2023 1405   GLUCOSE 93 03/10/2023 0433   BUN 7 03/13/2023 1405   CREATININE 0.49 (L) 03/13/2023 1405   CREATININE 0.52 05/23/2014 0855   CALCIUM 9.0 03/13/2023 1405   GFRNONAA >60 03/10/2023 0433   GFRAA >60 08/22/2017 0546    BNP    Component Value Date/Time   BNP  38.6 03/05/2023 1016    ProBNP    Component Value Date/Time   PROBNP 84 10/10/2022 1514    Imaging: CT CHEST HIGH RESOLUTION  Result Date: 03/13/2023 CLINICAL DATA:  Interstitial lung disease EXAM: CT CHEST WITHOUT CONTRAST TECHNIQUE: Multidetector CT imaging of the chest was performed following the standard protocol without intravenous contrast. High resolution imaging of the lungs, as well as inspiratory and expiratory imaging, was performed. RADIATION DOSE REDUCTION: This exam was performed according to the departmental dose-optimization program which includes automated exposure control, adjustment of the mA and/or kV according to patient size and/or use of iterative reconstruction technique. COMPARISON:  02/19/2023, 02/22/2022, 07/09/2019, 06/18/2018 FINDINGS: Cardiovascular: Aortic atherosclerosis. Normal heart size. Left coronary artery calcifications. No pericardial effusion. Mediastinum/Nodes: No enlarged mediastinal, hilar, or axillary lymph nodes. Thyroid gland, trachea, and esophagus demonstrate no significant findings. Lungs/Pleura: Severe fibrotic interstitial lung disease with apical to basal gradient, featuring  irregular peripheral interstitial opacity, septal thickening, extensive traction bronchiectasis, and subpleural bronchiolectasis throughout the lung bases, without clear evidence of honeycombing. Fibrotic findings are significantly worsened in comparison to prior examination dated 02/23/2012 as well as over time on additional prior examinations. Superimposed, diffusely scattered irregular and ground-glass nodularity throughout. No significant air trapping on expiratory phase imaging. No pleural effusion or pneumothorax. Upper Abdomen: No acute abnormality.  Status post cholecystectomy. Musculoskeletal: No chest wall abnormality. No acute osseous findings. IMPRESSION: 1. Severe fibrotic interstitial lung disease with apical to basal gradient, featuring irregular peripheral interstitial opacity, septal thickening, extensive traction bronchiectasis, and subpleural bronchiolectasis throughout the lung bases, without clear evidence of honeycombing. Fibrotic findings are significantly worsened in comparison to prior examination dated 02/23/2012 as well as over time on additional prior examinations. Findings remain in a probable UIP pattern, and although some imaging features are atypical, rapid progression to prior examination in general strongly favors UIP. Findings are categorized as probable UIP per consensus guidelines: Diagnosis of Idiopathic Pulmonary Fibrosis: An Official ATS/ERS/JRS/ALAT Clinical Practice Guideline. Am Rosezetta Schlatter Crit Care Med Vol 198, Iss 5, [id number], Jan 04 2017. 2. Superimposed, diffusely scattered irregular and ground-glass nodularity throughout. This may reflect a component of fibrotic interstitial lung disease or acutely superimposed nonspecific infection or inflammation. 3. Coronary artery disease. Aortic Atherosclerosis (ICD10-I70.0). Electronically Signed   By: Jearld Lesch M.D.   On: 03/13/2023 10:59   DG Chest 2 View  Result Date: 03/05/2023 CLINICAL DATA:  Shortness of breath.  EXAM: CHEST - 2 VIEW COMPARISON:  Chest radiograph dated 02/26/2023. FINDINGS: Stable mild cardiomegaly. Low lung volumes. Bilateral chronic interstitial changes with slightly increased hazy airspace opacities in the left and right midlung zones. No pneumothorax or pleural effusion. No acute osseous abnormality. IMPRESSION: Slightly increased hazy airspace opacities in the bilateral midlung zones is suggestive of infection or pulmonary edema superimposed on chronic interstitial lung disease. Electronically Signed   By: Hart Robinsons M.D.   On: 03/05/2023 12:09   DG Chest Port 1 View  Result Date: 02/26/2023 CLINICAL DATA:  Pulmonary edema. EXAM: PORTABLE CHEST 1 VIEW COMPARISON:  CT chest and chest x-ray dated February 19, 2023. FINDINGS: Unchanged mild cardiomegaly. Low lung volumes. Bilateral interstitial and hazy airspace opacities have improved at the lung bases. No pleural effusion or pneumothorax. No acute osseous abnormality. IMPRESSION: 1. Resolved pulmonary edema and/or infection superimposed on chronic interstitial lung disease. Electronically Signed   By: Obie Dredge M.D.   On: 02/26/2023 12:20   ECHOCARDIOGRAM COMPLETE  Result Date: 02/25/2023    ECHOCARDIOGRAM REPORT  Patient Name:   SHAKIRRA REM Date of Exam: 02/25/2023 Medical Rec #:  [id number]              Height:       59.0 in Accession #:    [id number]             Weight:       139.0 lb Date of Birth:  [birth year]              BSA:          1.580 m Patient Age:    57 years               BP:           96/55 mmHg Patient Gender: F                      HR:           91 bpm. Exam Location:  Inpatient Procedure: 2D Echo, Cardiac Doppler and Color Doppler Indications:    Pulmonary HTN I27.2  History:        Patient has prior history of Echocardiogram examinations, most                 recent 03/28/2020. Risk Factors:Dyslipidemia and Non-Smoker.  Sonographer:    Dondra Prader RVT RCS Referring Phys: [id number] SARAH F GROCE IMPRESSIONS   1. Left ventricular ejection fraction, by estimation, is 60 to 65%. The left ventricle has normal function. The left ventricle has no regional wall motion abnormalities. Left ventricular diastolic parameters are consistent with Grade I diastolic dysfunction (impaired relaxation).  2. Right ventricular systolic function is normal. The right ventricular size is normal. There is normal pulmonary artery systolic pressure. The estimated right ventricular systolic pressure is 24.0 mmHg.  3. The mitral valve is normal in structure. No evidence of mitral valve regurgitation. No evidence of mitral stenosis.  4. The aortic valve is normal in structure. Aortic valve regurgitation is not visualized. No aortic stenosis is present.  5. The inferior vena cava is normal in size with greater than 50% respiratory variability, suggesting right atrial pressure of 3 mmHg. FINDINGS  Left Ventricle: Left ventricular ejection fraction, by estimation, is 60 to 65%. The left ventricle has normal function. The left ventricle has no regional wall motion abnormalities. The left ventricular internal cavity size was normal in size. There is  no left ventricular hypertrophy. Left ventricular diastolic parameters are consistent with Grade I diastolic dysfunction (impaired relaxation). Right Ventricle: The right ventricular size is normal. No increase in right ventricular wall thickness. Right ventricular systolic function is normal. There is normal pulmonary artery systolic pressure. The tricuspid regurgitant velocity is 2.29 m/s, and  with an assumed right atrial pressure of 3 mmHg, the estimated right ventricular systolic pressure is 24.0 mmHg. Left Atrium: Left atrial size was normal in size. Right Atrium: Right atrial size was normal in size. Pericardium: There is no evidence of pericardial effusion. Mitral Valve: The mitral valve is normal in structure. No evidence of mitral valve regurgitation. No evidence of mitral valve stenosis. Tricuspid  Valve: The tricuspid valve is normal in structure. Tricuspid valve regurgitation is mild . No evidence of tricuspid stenosis. Aortic Valve: The aortic valve is normal in structure. Aortic valve regurgitation is not visualized. No aortic stenosis is present. Aortic valve mean gradient measures 4.0 mmHg. Aortic valve peak gradient measures 7.1 mmHg. Aortic valve area, by VTI measures 2.10 cm. Pulmonic Valve:  The pulmonic valve was normal in structure. Pulmonic valve regurgitation is trivial. No evidence of pulmonic stenosis. Aorta: The aortic root is normal in size and structure. Venous: The inferior vena cava is normal in size with greater than 50% respiratory variability, suggesting right atrial pressure of 3 mmHg. IAS/Shunts: No atrial level shunt detected by color flow Doppler.  LEFT VENTRICLE PLAX 2D LVIDd:         4.30 cm   Diastology LVIDs:         2.10 cm   LV e' medial:    7.51 cm/s LV PW:         1.00 cm   LV E/e' medial:  9.1 LV IVS:        0.80 cm   LV e' lateral:   7.62 cm/s LVOT diam:     1.80 cm   LV E/e' lateral: 8.9 LV SV:         53 LV SV Index:   34 LVOT Area:     2.54 cm  RIGHT VENTRICLE             IVC RV S prime:     11.80 cm/s  IVC diam: 1.70 cm TAPSE (M-mode): 1.8 cm LEFT ATRIUM             Index        RIGHT ATRIUM           Index LA diam:        3.60 cm 2.28 cm/m   RA Area:     10.45 cm LA Vol (A2C):   34.7 ml 21.96 ml/m  RA Volume:   21.35 ml  13.51 ml/m LA Vol (A4C):   38.7 ml 24.49 ml/m LA Biplane Vol: 37.3 ml 23.61 ml/m  AORTIC VALVE                    PULMONIC VALVE AV Area (Vmax):    2.13 cm     PV Vmax:          1.13 m/s AV Area (Vmean):   1.94 cm     PV Peak grad:     5.1 mmHg AV Area (VTI):     2.10 cm     PR End Diast Vel: 3.21 msec AV Vmax:           133.67 cm/s AV Vmean:          91.233 cm/s AV VTI:            0.254 m AV Peak Grad:      7.1 mmHg AV Mean Grad:      4.0 mmHg LVOT Vmax:         112.00 cm/s LVOT Vmean:        69.700 cm/s LVOT VTI:          0.210 m LVOT/AV  VTI ratio: 0.83  AORTA Ao Root diam: 2.70 cm Ao Asc diam:  2.90 cm MITRAL VALVE               TRICUSPID VALVE MV Area (PHT): 3.91 cm    TR Peak grad:   21.0 mmHg MV Decel Time: 194 msec    TR Vmax:        229.00 cm/s MV E velocity: 68.00 cm/s MV A velocity: 77.80 cm/s  SHUNTS MV E/A ratio:  0.87        Systemic VTI:  0.21 m  Systemic Diam: 1.80 cm Donato Schultz MD Electronically signed by Donato Schultz MD Signature Date/Time: 02/25/2023/11:45:22 AM    Final    CT Angio Chest PE W and/or Wo Contrast  Result Date: 02/19/2023 CLINICAL DATA:  Shortness of breath EXAM: CT ANGIOGRAPHY CHEST WITH CONTRAST TECHNIQUE: Multidetector CT imaging of the chest was performed using the standard protocol during bolus administration of intravenous contrast. Multiplanar CT image reconstructions and MIPs were obtained to evaluate the vascular anatomy. RADIATION DOSE REDUCTION: This exam was performed according to the departmental dose-optimization program which includes automated exposure control, adjustment of the mA and/or kV according to patient size and/or use of iterative reconstruction technique. CONTRAST:  75mL OMNIPAQUE IOHEXOL 350 MG/ML SOLN COMPARISON:  Most recent, high-resolution chest CT dated March 02, 2022 FINDINGS: Cardiovascular: No evidence of pulmonary embolus. Normal heart size. No pericardial effusion. Normal caliber thoracic aorta with mild atherosclerotic disease. Dilated main pulmonary artery, measuring up to 3.1 cm. Mediastinum/Nodes: Small hiatal hernia. Thyroid is unremarkable. Mildly enlarged bilateral hilar lymph nodes. Reference right hilar lymph node measuring 12 mm in short axis on series 5, image 69, unchanged when compared with prior and likely reactive. Lungs/Pleura: Central airways are patent. Lower lung and peribronchovascular predominant reticular and ground-glass opacities with associated traction bronchiectasis. Scattered areas of cystic change. Probable slight  progression with new mild peribronchovascular reticular and ground-glass opacity seen in the upper lobes, differences in exam technique somewhat limit evaluation. Peribronchovascular no pleural effusion or pneumothorax. Upper Abdomen: Prior cholecystectomy.  No acute abnormality. Musculoskeletal: No chest wall abnormality. No acute or significant osseous findings. Review of the MIP images confirms the above findings. IMPRESSION: 1. No evidence of pulmonary embolus. 2. Fibrotic interstitial lung disease, favor fibrotic NSIP. Possible slight progression in the bilateral upper lobes when compared with the prior exam, although exam technique limits evaluation. Follow-up with ILD protocol chest CT after resolution of the acute symptoms is recommended for further evaluation. Findings are suggestive of an alternative diagnosis (not UIP) per consensus guidelines: Diagnosis of Idiopathic Pulmonary Fibrosis: An Official ATS/ERS/JRS/ALAT Clinical Practice Guideline. Am Rosezetta Schlatter Crit Care Med Vol 198, Iss 5, [phone], Jan 04 2017. 3. Dilated main pulmonary artery, findings can be seen in setting of pulmonary hypertension. 4. Aortic Atherosclerosis (ICD10-I70.0). Electronically Signed   By: Allegra Lai M.D.   On: 02/19/2023 19:02   DG Chest 2 View  Result Date: 02/19/2023 CLINICAL DATA:  Constant cough and low oxygen saturations EXAM: CHEST - 2 VIEW COMPARISON:  11/12/2021 FINDINGS: Stable cardiomediastinal silhouette. Pulmonary vascular congestion. Increased hazy bilateral airspace and interstitial opacities greatest in the lower lungs. No pleural effusion or pneumothorax. IMPRESSION: Findings suggestive of infection or edema superimposed on a background of chronic interstitial lung disease. Electronically Signed   By: Minerva Fester M.D.   On: 02/19/2023 15:04     Assessment & Plan:   1. ILD (interstitial lung disease) (HCC) - Ambulatory Referral for DME - CBC with Differential - Basic Metabolic Panel  (BMET)  2. Chronic respiratory failure with hypoxia (HCC) - Ambulatory Referral for DME  3. Long term systemic steroid user - Hemoglobin A1c  Interstitial Lung Disease (ILD) / Pulmonary Fibrosis Progressive fibrosis noted on CT scan. Patient previously on Pirfenidone (Esbriet) but discontinued due to adverse effects including GI symptoms and general malaise. -Discussed treatment options at length recommending patient be trailed on a difference antifibrotic called Ofev, recommend low dose 100mg  twice daily due to tolerance issues with Pirfenidone. She would not likely be a good  candidate for bronchoscopy for BAL, empirically treating with immunosuppressant medication  - Start Cellcept 500mg  twice daily (titrate as tolerates to 1500mg  BID) - Stay on prednisone 40mg  daily until follow-up - Continue Bactrim DS MWF for pneumonia prophylaxis while on Prednisone - Started discussion about need for palliative care/hospice in the future if unable to tolerate medication and fibrosis and chronic respiratory failure progress  - Close follow-up with Dr. Isaiah Serge and labs (CMET/CBC) in 1 month.   Pneumonia - Clinically better  - Completed outpatient course of Augmentin  - Follow-up CXR today and labs   Chronic respiratory failure with hypoxemia  - Requiring 6-8L oxygen to maintain O2> 88-90% - Needs 10L home concentrator - DME order placed for oxymizer and face mask for oxygen to help with comfort/tolerance - May want to cosnider/discuss need for NIV at follow-up   >70 mins spent on case: 40 mins in room face to face with patient   Glenford Bayley, NP 03/18/2023

## 2023-03-19 LAB — CBC WITH DIFFERENTIAL/PLATELET
Basophils Absolute: 0.1 10*3/uL (ref 0.0–0.1)
Basophils Relative: 0.4 % (ref 0.0–3.0)
Eosinophils Absolute: 0.2 10*3/uL (ref 0.0–0.7)
Eosinophils Relative: 1.2 % (ref 0.0–5.0)
HCT: 39.1 % (ref 36.0–46.0)
Hemoglobin: 12.8 g/dL (ref 12.0–15.0)
Lymphocytes Relative: 7.2 % — ABNORMAL LOW (ref 12.0–46.0)
Lymphs Abs: 1.3 10*3/uL (ref 0.7–4.0)
MCHC: 32.7 g/dL (ref 30.0–36.0)
MCV: 94.7 fL (ref 78.0–100.0)
Monocytes Absolute: 0.5 10*3/uL (ref 0.1–1.0)
Monocytes Relative: 2.7 % — ABNORMAL LOW (ref 3.0–12.0)
Neutro Abs: 15.8 10*3/uL — ABNORMAL HIGH (ref 1.4–7.7)
Neutrophils Relative %: 88.5 % — ABNORMAL HIGH (ref 43.0–77.0)
Platelets: 312 10*3/uL (ref 150.0–400.0)
RBC: 4.13 Mil/uL (ref 3.87–5.11)
RDW: 13.7 % (ref 11.5–15.5)
WBC: 17.9 10*3/uL — ABNORMAL HIGH (ref 4.0–10.5)

## 2023-03-19 LAB — HEMOGLOBIN A1C
Hgb A1c MFr Bld: 5.9 %{Hb} — ABNORMAL HIGH (ref <5.7)
Mean Plasma Glucose: 123 mg/dL
eAG (mmol/L): 6.8 mmol/L

## 2023-03-19 NOTE — Progress Notes (Signed)
Internal Medicine Clinic Attending  Case discussed with the resident at the time of the visit.  We reviewed the resident's history and exam and pertinent patient test results.  I agree with the assessment, diagnosis, and plan of care documented in the resident's note.  Debe Coder, MD

## 2023-03-19 NOTE — Progress Notes (Signed)
WBC elevated, likely from prednisone CXR showed improvement in airspace disease Get labs in 1 months as planned  Continue all other recommendations If symptoms worsen return to ED

## 2023-03-21 ENCOUNTER — Telehealth: Payer: Self-pay

## 2023-03-21 NOTE — Telephone Encounter (Signed)
   Patient is uninsured and will need to complete [employer] paperwork. She has recently left the clinic, therefore we will need to f/u at a later time to discuss the process.

## 2023-03-21 NOTE — Telephone Encounter (Signed)
I sent in the RX already for cellcept

## 2023-03-28 ENCOUNTER — Other Ambulatory Visit (HOSPITAL_COMMUNITY): Payer: Self-pay

## 2023-03-31 ENCOUNTER — Other Ambulatory Visit (HOSPITAL_COMMUNITY): Payer: Self-pay

## 2023-04-01 ENCOUNTER — Other Ambulatory Visit (HOSPITAL_COMMUNITY): Payer: Self-pay

## 2023-04-01 ENCOUNTER — Other Ambulatory Visit: Payer: Self-pay | Admitting: Student

## 2023-04-01 MED ORDER — PANTOPRAZOLE SODIUM 40 MG PO TBEC
40.0000 mg | DELAYED_RELEASE_TABLET | Freq: Every day | ORAL | 0 refills | Status: DC
  Filled 2023-04-01: qty 60, 60d supply, fill #0

## 2023-04-09 NOTE — Telephone Encounter (Signed)
Called patient using interpreter, [phone]. Rx  VM left by interpreter. Application mailed to patient's home today.  Provider portion placed in [facility] folder for signature  Chesley Mires, PharmD, MPH, BCPS, CPP Clinical Pharmacist (Rheumatology and Pulmonology)

## 2023-04-16 ENCOUNTER — Ambulatory Visit (INDEPENDENT_AMBULATORY_CARE_PROVIDER_SITE_OTHER): Payer: Self-pay | Admitting: Pulmonary Disease

## 2023-04-16 ENCOUNTER — Other Ambulatory Visit (HOSPITAL_COMMUNITY): Payer: Self-pay

## 2023-04-16 ENCOUNTER — Encounter: Payer: Self-pay | Admitting: Pulmonary Disease

## 2023-04-16 VITALS — BP 108/70 | HR 107 | Temp 98.0°F | Ht <= 58 in | Wt 135.8 lb

## 2023-04-16 DIAGNOSIS — K219 Gastro-esophageal reflux disease without esophagitis: Secondary | ICD-10-CM

## 2023-04-16 DIAGNOSIS — J849 Interstitial pulmonary disease, unspecified: Secondary | ICD-10-CM

## 2023-04-16 MED ORDER — PREDNISONE 20 MG PO TABS
40.0000 mg | ORAL_TABLET | Freq: Every day | ORAL | 3 refills | Status: DC
  Filled 2023-04-16 – 2023-05-19 (×2): qty 90, 45d supply, fill #0
  Filled 2023-07-17: qty 90, 45d supply, fill #1
  Filled 2023-09-16: qty 90, 45d supply, fill #2

## 2023-04-16 NOTE — Progress Notes (Signed)
Olivia [name]    [id number]    [birth year]  Primary Care Physician:Bender, Irving Burton, DO  Referring Physician: Faith Rogue, DO [address] [place],  [place] [address]  Chief complaint: Follow-up for interstitial lung disease  HPI: 57 y.o. who  has a past medical history of Acute respiratory distress (03/27/2020), Elevated blood pressure reading (12/21/2018), GERD (gastroesophageal reflux disease), Healthcare maintenance (06/28/2019), History of COVID-19 (09/02/2019), Preventative health care (01/15/2013), Pulmonary fibrosis (HCC), and Screening breast examination (11/26/2018).   Cough started in 2019 after she had a fall.  Was seen in 2020 with a CT scan showing interstitial lung disease and was referred to pulmonary clinic.  She has been placed on prednisone since then with minimal improvement in symptoms.  Recent chest x-ray shows worsening ILD and has been referred to ILD clinic for further evaluation  Complains of chronic cough, chest congestion and dyspnea for the past 3 years.  Denies any joint pain or rash.  She has occasional difficulty swallowing.  Has dry mouth, dry eyes.  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  Currently stays at home Exposures: No mold, hot tub, Jacuzzi.  No feather pillows or comforters ILD questionnaire 12/27/2021-negative Smoking history: Never smoker Travel history: Immigrated from [place] in 2000.  No significant recent travel Relevant family history: Sister has asthma.  Interim history: Discussed the use of AI scribe software for clinical note transcription with the patient, who gave verbal consent to proceed.  Started pirfenidone on end of Jan 2024.  But she stopped it after few months due to severe GI side effects, weight loss She was hospitalized in October 2020 for for respiratory failure and ILD exacerbation. She was discharged on high dose prednisone and Bactrim.   Postdischarge she continues to make slow improvements.  Remains on supplemental oxygen, prednisone at 40 mg/day and Bactrim  Her case was presented at ILD conference in Nov 2024 with diagnosis with progressive fibrotic NSIP. Recommendations were to restart antifibrotics and start immunosuppressant medication such as CellCept.   At last visit with Buelah Manis, nurse practitioner we prescribed CellCept and Ofev but she still has not received them.  She is uninsured and needs patient assistance to get medications  Outpatient Encounter Medications as of 04/16/2023  Medication Sig   albuterol (PROVENTIL) (2.5 MG/3ML) 0.083% nebulizer solution Inhale 3 mLs into the lungs by nebulizer every 4 (four) hours as needed for wheezing or shortness of breath.   benzonatate (TESSALON) 100 MG capsule Take 1 capsule (100 mg total) by mouth 3 (three) times daily.   guaiFENesin-dextromethorphan (ROBITUSSIN DM) 100-10 MG/5ML syrup Take 5 mLs by mouth every 4 (four) hours as needed for cough.   ipratropium-albuterol (DUONEB) 0.5-2.5 (3) MG/3ML SOLN Take 3 mLs by nebulization 3 (three) times daily.   mycophenolate (CELLCEPT) 500 MG tablet Take 1 tablet (500 mg total) by mouth 2 (two) times daily.   pantoprazole (PROTONIX) 40 MG tablet Take 1 tablet (40 mg total) by mouth daily.   predniSONE (DELTASONE) 20 MG tablet Take 2 tablets (40 mg total) by mouth daily with breakfast.   sodium chloride (OCEAN) 0.65 % SOLN nasal spray Place 1 spray into both nostrils 3 (three) times daily.   sulfamethoxazole-trimethoprim (BACTRIM) 400-80 MG tablet Take 1 tablet by mouth 3 (three) times a week.   potassium chloride (KLOR-CON 10) 10 MEQ tablet Take 2 tablets (20 mEq total) by mouth daily  for 10 doses.   No facility-administered encounter medications on file as of 04/16/2023.   Physical Exam: Blood pressure 108/70, pulse (!) 107, temperature 98 F (36.7 C), temperature source Oral, height 4\' 9"  (1.448 m), weight 135 lb 12.8 oz  (61.6 kg), SpO2 96%. Gen:      No acute distress HEENT:  EOMI, sclera anicteric Neck:     No masses; no thyromegaly Lungs:   Bibasal crackles CV:         Regular rate and rhythm; no murmurs Abd:      + bowel sounds; soft, non-tender; no palpable masses, no distension Ext:    No edema; adequate peripheral perfusion Skin:      Warm and dry; no rash Neuro: alert and oriented x 3 Psych: normal mood and affect   Data Reviewed: Imaging: High-resolution CT 06/25/2018-widespread groundglass attenuation in the mid to lower lungs with nodular appearance.  Indeterminate for UIP. High-resolution CT 07/09/2019-similar findings of interstitial lung disease as before with slight improvement CTA 03/27/2020-groundglass opacitie obscured by motion artifact CTA 07/07/2021-interstitial and groundglass opacities with significant progression. Chest x-ray 11/12/2021-increased interstitial opacities CT high-resolution 02/22/2022-interstitial lung disease with basilar predominant fibrosis and probable UIP pattern, coronary atherosclerosis. CT high-resolution 03/11/2023-severe fibrotic interstitial lung disease with significant worsening compared to 02/23/2012 and probable UIP pattern I have reviewed the images personally.  PFTs: 09/02/2019-unable to complete  02/22/2022-unable to complete due to cough  Labs: Hypersensitivity panel 09/01/2018-negative ANA, rheumatoid factor 08/04/2018-negative  Assessment:  Interstitial lung disease Review of CT scan shows ILD dating back to 2020 initially read as indeterminate but now in probable UIP pattern.  She does not have any signs and symptoms of connective tissue disease or exposures Discussed at multidisciplinary conference with diagnosis of progressive fibrotic NSIP She could not complete PFTs in the past due to cough.  Pirfenidone was poorly tolerated earlier this year. We are attempting to get her on CellCept and Ofev but lack of insurance is a barrier.  I will check  with pharmacy about status of her patient assistance Continue prednisone at 40 mg/day and Bactrim for prophylaxis for now.  GERD Has a small hiatal hernia.  Continue Protonix and Tums  Plan/Recommendations: Check with pharmacy about patient assistance for CellCept and Ofev  Chilton Greathouse MD [facility] 04/16/2023, 4:33 PM  CC: Faith Rogue, DO

## 2023-04-16 NOTE — Patient Instructions (Signed)
VISIT SUMMARY:  You came in today for medication management and reported feeling much better since your last visit. We discussed issues with your current medications and addressed your need for a new handicap parking permit form.  YOUR PLAN:  -MEDICATION MANAGEMENT: You have not received the prescribed medications Cellcept and OFEV, but you are currently taking Prednisone. Cellcept is used to suppress the immune system, and OFEV is used to treat lung conditions. We will coordinate with the pharmacy to ensure you receive these medications and make sure you have enough Prednisone until this is resolved.  -HANDICAP PARKING PERMIT: You requested a new handicap parking permit form because the previous one was lost. We will provide you with a new form.  INSTRUCTIONS:  Please schedule a follow-up appointment in 1-2 months with either the physician or nurse practitioner.

## 2023-04-23 NOTE — Telephone Encounter (Signed)
Provider portion received and placed in "awaiting response" folder.

## 2023-04-28 ENCOUNTER — Other Ambulatory Visit (HOSPITAL_COMMUNITY): Payer: Self-pay

## 2023-04-28 ENCOUNTER — Telehealth: Payer: Self-pay | Admitting: Pharmacist

## 2023-04-28 NOTE — Telephone Encounter (Addendum)
Spoke with patient regarding Cellcept and Ofev.  Ofev application was mailed to her home and she confirmed she has it home. She will complete and return to clinic ATTN pharmacy team with proof of household income.  Will mail application for Cellcept PAP through [place]. Provider portion placed in Dr. Shirlee More mailbox for signature  Patient will complete Cellcept application and Ofev application and return to office ATTN pharmacy team  Interpreter # [phone]  ----- Message from [facility] sent at 04/16/2023  4:52 PM EST ----- Can you check on status of patient assistance for Ofev.  She cannot afford CellCept as well.  Is any way we can help her get back to.  Thank you

## 2023-04-28 NOTE — Telephone Encounter (Deleted)
Spoke with patient regarding Cellcept and Ofev.  Ofev application was mailed to her home and she confirmed she has it home. She will complete and return to clinic ATTN pharmacy team with proof of household income.  Will mail application for Cellcept PAP through [place]. Provider portion placed in Dr. Shirlee More mailbox for signature  Patient will complete Cellcept application and Ofev application and return to office ATTN pharmacy team  Interpreter # [phone]  ----- Message from [facility] sent at 04/16/2023  4:52 PM EST ----- Can you check on status of patient assistance for Ofev.  She cannot afford CellCept as well.  Is any way we can help her get back to.  Thank you

## 2023-04-28 NOTE — Telephone Encounter (Signed)
Spoke with patient regarding Cellcept and Ofev.  Ofev application was mailed to her home and she confirmed she has it home. She will complete and return to clinic ATTN pharmacy team with proof of household income.  Will mail application for Cellcept PAP through [place]. Provider portion placed in Dr. Shirlee More mailbox for signature  Patient will complete Cellcept application and Ofev application and return to office ATTN pharmacy team  Interpreter # [phone]  ----- Message from [facility] sent at 04/16/2023  4:52 PM EST ----- Can you check on status of patient assistance for Ofev.  She cannot afford CellCept as well.  Is any way we can help her get back to.  Thank you

## 2023-05-12 ENCOUNTER — Other Ambulatory Visit: Payer: Self-pay | Admitting: Student

## 2023-05-13 ENCOUNTER — Other Ambulatory Visit (HOSPITAL_COMMUNITY): Payer: Self-pay

## 2023-05-15 ENCOUNTER — Other Ambulatory Visit (HOSPITAL_COMMUNITY): Payer: Self-pay

## 2023-05-15 MED ORDER — BENZONATATE 100 MG PO CAPS
100.0000 mg | ORAL_CAPSULE | Freq: Three times a day (TID) | ORAL | 0 refills | Status: DC
  Filled 2023-05-15: qty 20, 7d supply, fill #0

## 2023-05-19 ENCOUNTER — Other Ambulatory Visit (HOSPITAL_COMMUNITY): Payer: Self-pay

## 2023-05-23 ENCOUNTER — Telehealth: Payer: Self-pay | Admitting: Pulmonary Disease

## 2023-05-23 NOTE — Telephone Encounter (Signed)
PT's husband presented to the front with a closed envelope saying it was for Dr. Isaiah Serge for the "pharmacy" Since it is likely a patient assist form I put it in the RX box. NFN

## 2023-05-28 NOTE — Progress Notes (Addendum)
   Interstitial Lung Disease Multidisciplinary Conference   Olivia [name]    MRN [id number]    DOB [birth year]  Primary Care Physician:Bender, Irving Burton, DO  Referring Physician: Dr. Chilton Greathouse  Time of Conference: 7.30am- 8.30am Date of conference: 03/18/2023 Location of Conference: -  Virtual  Participating Pulmonary: Dr. Kalman Shan, MD,  Dr Chilton Greathouse, MD  Pathology:   Radiology: Dr Cleone Slim MD  Brief History:  58 year old with history of COVID-19 in 2021 being followed for cough.  She has no known exposures, connective tissue disease workup has been negative.  Tried on pirfenidone for progression of pulmonary fibrosis but did not tolerate Please review scan to determine trajectory of disease and pattern of ILD as it has been variously described as NSIP and probable UIP.  PFT     No data to display          MDD discussion of CT scan    High Res CT  03/11/2023  Lower lung predominant, Large Ground glass component with homogeneity Alternate Pattern, Fibrotic NSIP with progression.  MDD Impression/Recs:  CT pattern shows alternate pattern and not probable UIP. Reconsider immunosuppression and alternate anti fibrotic therapy due to progression Since she is intolerant of pirfenidone we can also consider Inhaled esbriet, and inhaled tyvaso and research options.  Time Spent in preparation and discussion:  > 30 min  SIGNATURE   Dara Beidleman MD  Pulmonary & Critical care See Amion for pager  If no response to pager , please call [phone] until 7pm After 7:00 pm call Elink  [phone] 05/28/2023, 3:03 PM

## 2023-06-02 NOTE — Telephone Encounter (Signed)
Pt portions have been received. Submitted Patient Assistance Application to [facility] for OFEV along with provider portion, patient portion, medication list, and income documents. Will update patient when we receive a response.  Phone #: [phone] Fax #: [phone]  It does not appear that Dr. Isaiah Serge has signed and returned his portion of the Cellcept application to [place] at this time.

## 2023-06-12 ENCOUNTER — Other Ambulatory Visit (HOSPITAL_COMMUNITY): Payer: Self-pay

## 2023-06-12 ENCOUNTER — Other Ambulatory Visit: Payer: Self-pay

## 2023-06-12 ENCOUNTER — Ambulatory Visit: Payer: No Typology Code available for payment source | Admitting: Student

## 2023-06-12 VITALS — BP 112/76 | HR 91 | Temp 98.0°F | Ht <= 58 in | Wt 140.1 lb

## 2023-06-12 DIAGNOSIS — R7303 Prediabetes: Secondary | ICD-10-CM

## 2023-06-12 DIAGNOSIS — J849 Interstitial pulmonary disease, unspecified: Secondary | ICD-10-CM

## 2023-06-12 DIAGNOSIS — I739 Peripheral vascular disease, unspecified: Secondary | ICD-10-CM

## 2023-06-12 LAB — POCT GLYCOSYLATED HEMOGLOBIN (HGB A1C): Hemoglobin A1C: 6.1 % — AB (ref 4.0–5.6)

## 2023-06-12 LAB — GLUCOSE, CAPILLARY: Glucose-Capillary: 169 mg/dL — ABNORMAL HIGH (ref 70–99)

## 2023-06-12 IMAGING — MG MM DIGITAL SCREENING BILAT W/ TOMO AND CAD
8 series · 8 of 24 positions shown · non-contrast
Comparison: Previous exam(s).

CLINICAL DATA: Screening.

EXAM:
DIGITAL SCREENING BILATERAL MAMMOGRAM WITH TOMOSYNTHESIS AND CAD
TECHNIQUE: Bilateral screening digital craniocaudal and mediolateral oblique
mammograms were obtained. Bilateral screening digital breast
tomosynthesis was performed. The images were evaluated with
computer-aided detection.

[L CC synth-2D]
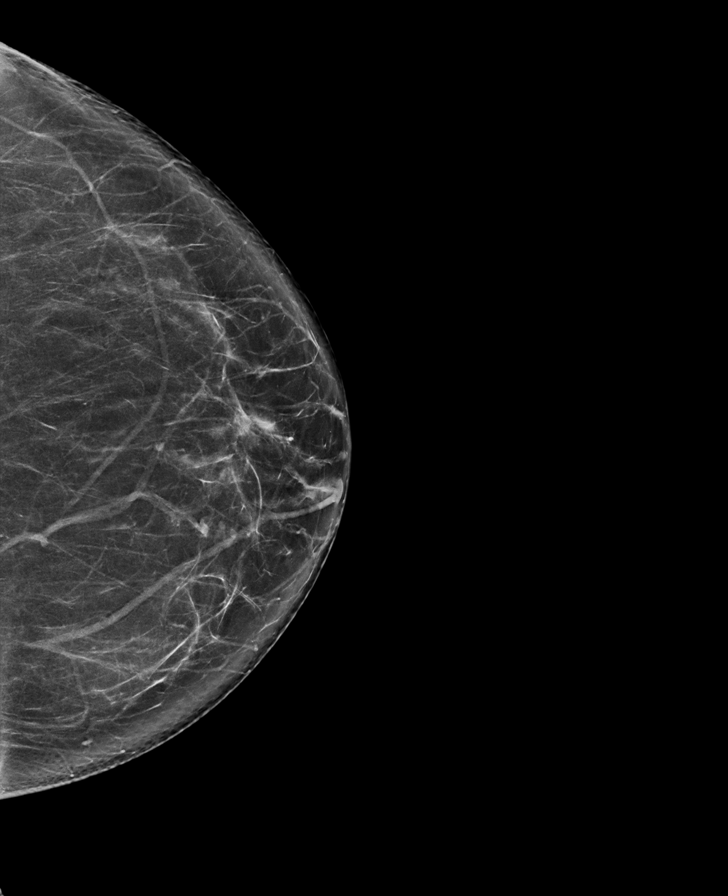

[L MLO synth-2D]
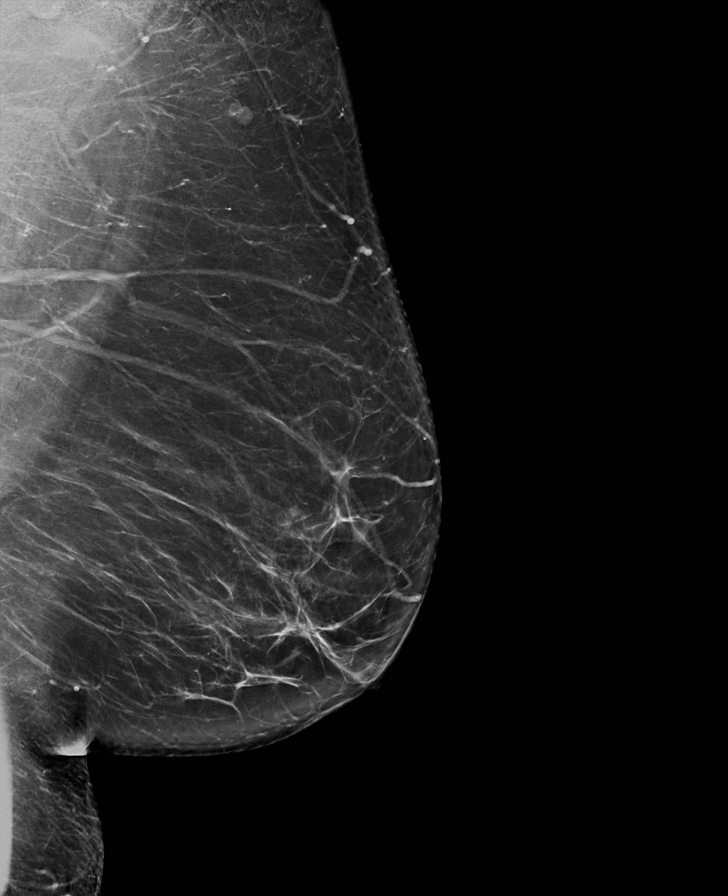

[R CC synth-2D]
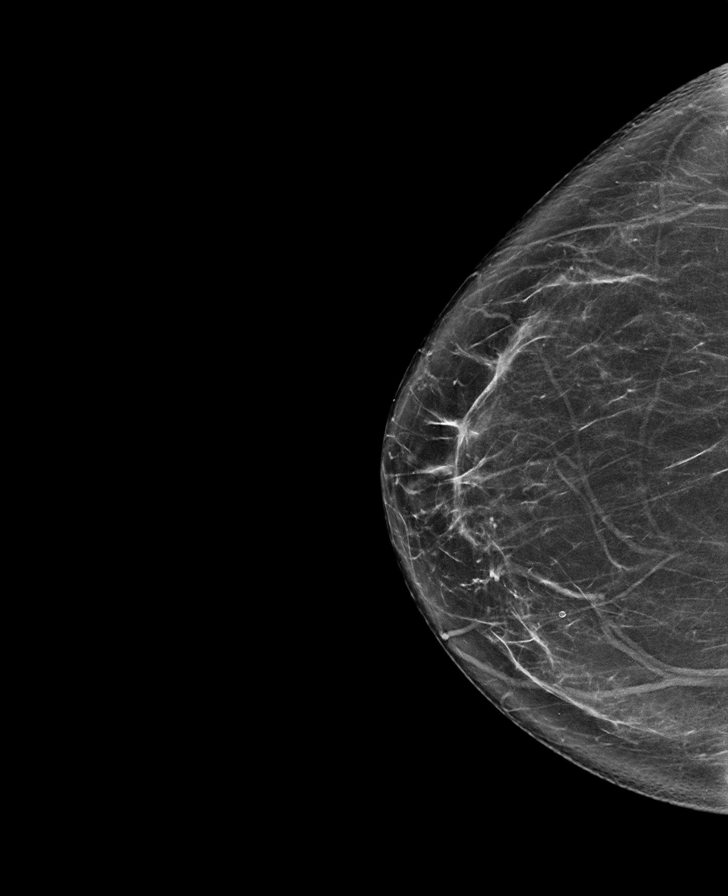

[R MLO synth-2D]
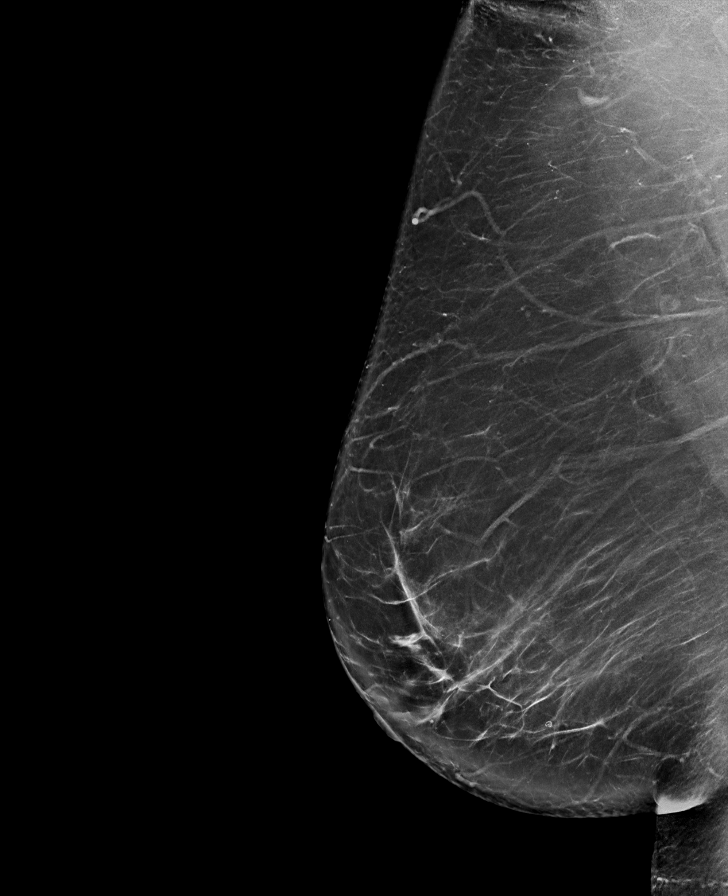

[R CC tomo · tomo slice 43/84.0]
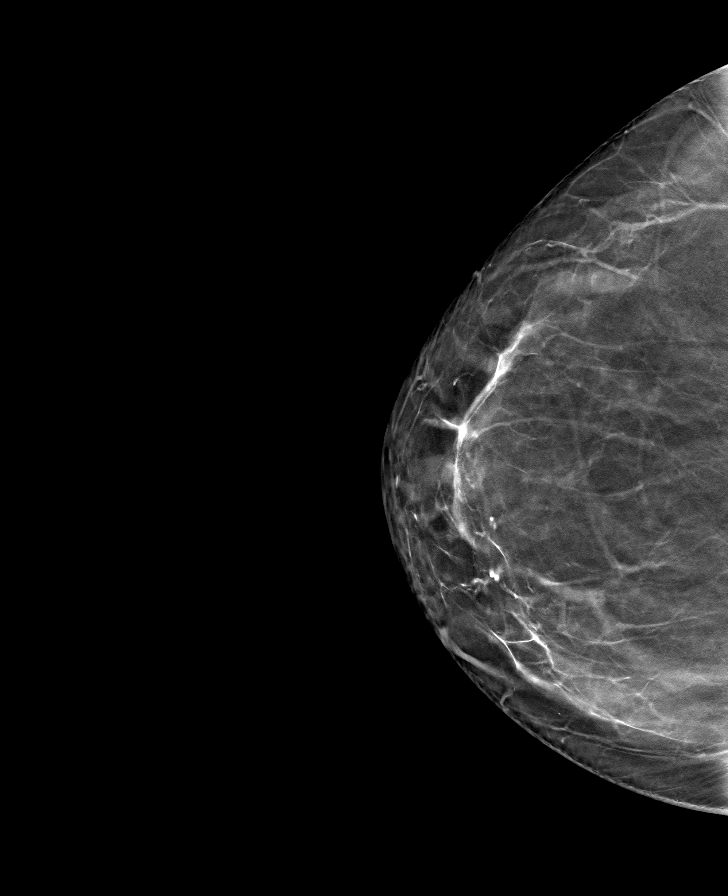

[R MLO tomo · tomo slice 49/98.0]
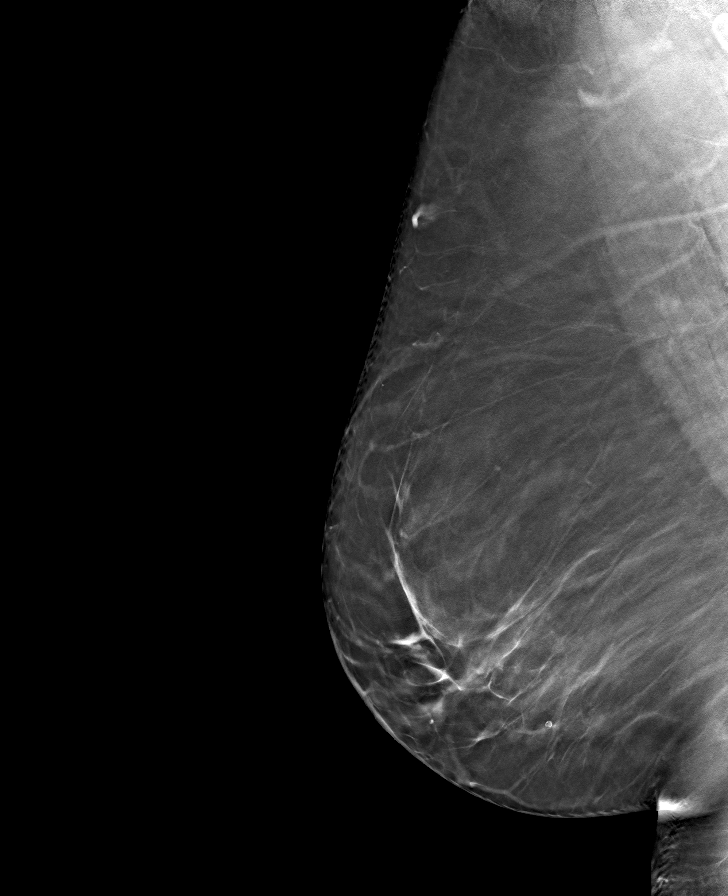

[L MLO tomo · tomo slice 47/93.0]
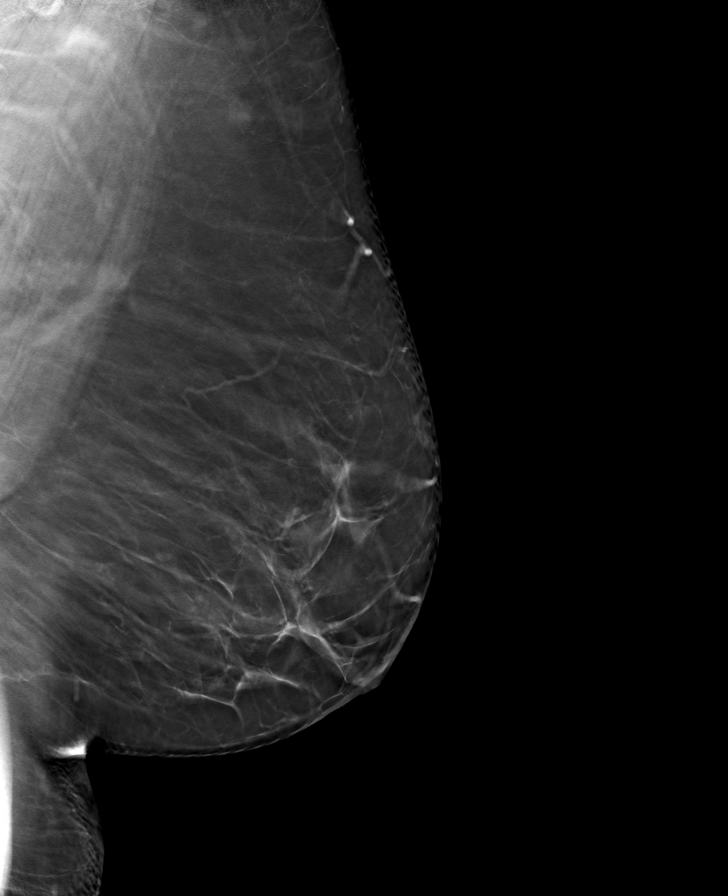

[L CC tomo · tomo slice 43/84.0]
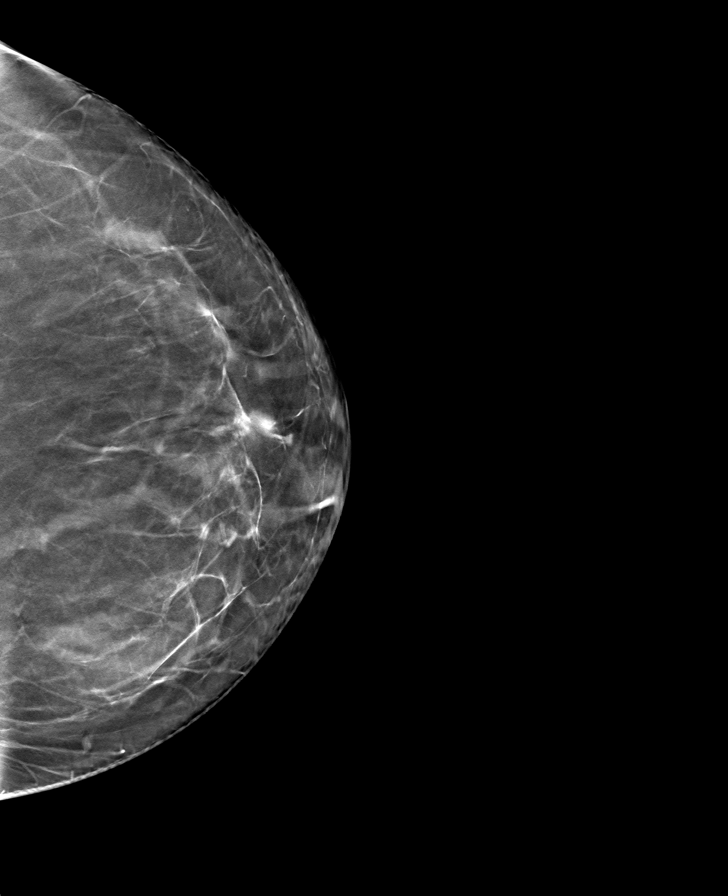

[8 of 24 positions shown; findings below may reference images not displayed]

ACR Breast Density Category b: There are scattered areas of
fibroglandular density.
FINDINGS: There are no findings suspicious for malignancy.
IMPRESSION: No mammographic evidence of malignancy. A result letter of this
screening mammogram will be mailed directly to the patient.

RECOMMENDATION:
Screening mammogram in one year. (Code:51-O-LD2)

BI-RADS CATEGORY  1: Negative.

## 2023-06-12 MED ORDER — IPRATROPIUM-ALBUTEROL 0.5-2.5 (3) MG/3ML IN SOLN
3.0000 mL | Freq: Three times a day (TID) | RESPIRATORY_TRACT | 0 refills | Status: DC
  Filled 2023-06-12: qty 360, 40d supply, fill #0

## 2023-06-12 MED ORDER — BENZONATATE 100 MG PO CAPS
100.0000 mg | ORAL_CAPSULE | Freq: Three times a day (TID) | ORAL | 0 refills | Status: DC
  Filled 2023-06-12: qty 20, 7d supply, fill #0

## 2023-06-12 MED ORDER — ROSUVASTATIN CALCIUM 20 MG PO TABS
20.0000 mg | ORAL_TABLET | Freq: Every day | ORAL | 11 refills | Status: AC
  Filled 2023-06-12: qty 30, 30d supply, fill #0

## 2023-06-12 NOTE — Patient Instructions (Addendum)
 Thank you so much for coming to the clinic today!   - START taking Crestor  20 mg daily  - Continue taking your other medications as prescribed\ - Follow-up in 3 months  If you have any questions please feel free to the call the clinic at anytime at [phone]. It was a pleasure seeing you!  Best, Dr. Stephanie   ------------------------------- Muchas gracias por venir a la clnica hoy!  - COMIENCE a tomar Crestor  20 mg por da - Contine tomando sus otros medicamentos segn lo prescrito - Aeronautical engineer seguimiento en 3 meses  Si tiene alguna pregunta, no dude en llamar a la clnica en cualquier momento al [phone]. Fue psychiatrist verlo!  Atentamente, Dr. Stephanie

## 2023-06-12 NOTE — Progress Notes (Signed)
 CC: Follow-up  HPI: [name] is a 58 y.o. female living with a history stated below and presents today for follow-up for her interstitial lung disease. Please see problem based assessment and plan for additional details.  Past Medical History:  Diagnosis Date   Acute respiratory distress 03/27/2020   Elevated blood pressure reading 12/21/2018   GERD (gastroesophageal reflux disease)    Healthcare maintenance 06/28/2019   Per patient's recent discharge summary, FOBT positive in ED. Given her overall prognosis and health condition, will hold off discussing colonoscopy.   History of COVID-19 09/02/2019   05/03/19 - [place]2 - positive  Did not get mab  Did not get hosp   Preventative health care 01/15/2013   Pulmonary fibrosis (HCC)    Screening breast examination 11/26/2018    Current Outpatient Medications on File Prior to Visit  Medication Sig Dispense Refill   albuterol  (PROVENTIL ) (2.5 MG/3ML) 0.083% nebulizer solution Inhale 3 mLs into the lungs by nebulizer every 4 (four) hours as needed for wheezing or shortness of breath. 75 mL 12   guaiFENesin -dextromethorphan  (ROBITUSSIN DM) 100-10 MG/5ML syrup Take 5 mLs by mouth every 4 (four) hours as needed for cough. 118 mL 0   mycophenolate  (CELLCEPT ) 500 MG tablet Take 1 tablet (500 mg total) by mouth 2 (two) times daily. 60 tablet 1   pantoprazole  (PROTONIX ) 40 MG tablet Take 1 tablet (40 mg total) by mouth daily. 60 tablet 0   potassium chloride  (KLOR-CON  10) 10 MEQ tablet Take 2 tablets (20 mEq total) by mouth daily for 10 doses. 20 tablet 0   predniSONE  (DELTASONE ) 20 MG tablet Take 2 tablets (40 mg total) by mouth daily with breakfast. 90 tablet 3   sodium chloride  (OCEAN) 0.65 % SOLN nasal spray Place 1 spray into both nostrils 3 (three) times daily. 88 mL 0   sulfamethoxazole -trimethoprim  (BACTRIM ) 400-80 MG tablet Take 1 tablet by mouth 3 (three) times a week. 90 tablet 3   No current facility-administered  medications on file prior to visit.    Family History  Problem Relation Age of Onset   Diabetes Father    Hypertension Father    Asthma Sister    Diabetes Paternal Grandfather    Breast cancer Neg Hx     Social History   Socioeconomic History   Marital status: Married    Spouse name: Not on file   Number of children: 3   Years of education: Not on file   Highest education level: 9th grade  Occupational History   Not on file  Tobacco Use   Smoking status: Never    Passive exposure: Never   Smokeless tobacco: Never  Vaping Use   Vaping status: Never Used  Substance and Sexual Activity   Alcohol use: Not Currently   Drug use: No   Sexual activity: Not Currently    Birth control/protection: Post-menopausal  Other Topics Concern   Not on file  Social History Narrative   Native of [place], [place]   Husband, [place], alcoholic   3 children, Oldest son in [place]   Grandchildren, 3         Social Drivers of Health   Financial Resource Strain: High Risk (07/08/2021)   Overall Financial Resource Strain (CARDIA)    Difficulty of Paying Living Expenses: Very hard  Food Insecurity: No Food Insecurity (03/06/2023)   Hunger Vital Sign    Worried About Running Out of Food in the Last Year: Never true    Ran Out of  Food in the Last Year: Never true  Transportation Needs: No Transportation Needs (03/06/2023)   PRAPARE - Administrator, Civil Service (Medical): No    Lack of Transportation (Non-Medical): No  Physical Activity: Not on file  Stress: Not on file  Social Connections: Not on file  Intimate Partner Violence: Not At Risk (03/06/2023)   Humiliation, Afraid, Rape, and Kick questionnaire    Fear of Current or Ex-Partner: No    Emotionally Abused: No    Physically Abused: No    Sexually Abused: No    Review of Systems: ROS negative except for what is noted on the assessment and plan.  Vitals:   06/12/23 1503  BP: 112/76  Pulse: 91  Temp: 98 F  (36.7 C)  TempSrc: Oral  SpO2: 99%  Weight: 140 lb 1.6 oz (63.5 kg)  Height: 4' 9 (1.448 m)   Physical Exam Constitutional:      Appearance: Normal appearance.  HENT:     Head: Normocephalic and atraumatic.  Cardiovascular:     Rate and Rhythm: Normal rate and regular rhythm.     Heart sounds: Normal heart sounds.  Pulmonary:     Effort: Pulmonary effort is normal.     Breath sounds: Rales present.     Comments: Currently required 6 L of supplemental oxygen Abdominal:     General: Abdomen is flat. Bowel sounds are normal.     Palpations: Abdomen is soft.  Neurological:     Mental Status: She is alert.    Assessment & Plan:   Prediabetes Her last A1c was 3 months ago which was noted to have been on the cusp of prediabetes at 5.6%.  Today, her repeat A1c is 6.1%.  This is likely secondary from her daily prednisone  use from her interstitial lung disease.  No indication for treatment at this time but will recheck A1c in 3 months. - Follow-up A1c in 3 months  PVD (peripheral vascular disease) (HCC) Last lipid panel 1 year ago. LDL was 122.  She was on Crestor  20 mg for one month but has not picked up for 2 years.  Given her history of peripheral arterial disease and abnormal ABIs, she will start on Crestor .  Will will likely need to follow-up lipid panel at her next visit. - Resume Crestor  20 mg daily  ILD (interstitial lung disease) (HCC) Patient was last seen in the clinic 3 months ago for hospital follow-up after acute on chronic hypoxic respiratory failure with aspiration pneumonia in context of ILD.  Her medications include prednisone  40 mg daily and Bactrim  400-83 times weekly.  Since this time, she notes overall improvement.  She is now able to ambulate to the bathroom without becoming short of breath. She continues to be followed by pulmonology.  She was recommended that she resume her antifibrotics and start Cellcept  500 mg BID.  She currently requires 6 to 8 L of oxygen at  home. - Follow-up pulmonology - Continue prednisone  40 mg daily and Bactrim   Patient discussed with Dr. Jerrell Signe Arabian, MD  [facility] Internal Medicine, PGY-1 Date 06/13/2023 Time 4:35 PM

## 2023-06-13 DIAGNOSIS — R7303 Prediabetes: Secondary | ICD-10-CM | POA: Insufficient documentation

## 2023-06-13 NOTE — Assessment & Plan Note (Signed)
 Last lipid panel 1 year ago. LDL was 122.  She was on Crestor  20 mg for one month but has not picked up for 2 years.  Given her history of peripheral arterial disease and abnormal ABIs, she will start on Crestor .  Will will likely need to follow-up lipid panel at her next visit. - Resume Crestor  20 mg daily

## 2023-06-13 NOTE — Assessment & Plan Note (Signed)
 Her last A1c was 3 months ago which was noted to have been on the cusp of prediabetes at 5.6%.  Today, her repeat A1c is 6.1%.  This is likely secondary from her daily prednisone  use from her interstitial lung disease.  No indication for treatment at this time but will recheck A1c in 3 months. - Follow-up A1c in 3 months

## 2023-06-13 NOTE — Assessment & Plan Note (Signed)
 Patient was last seen in the clinic 3 months ago for hospital follow-up after acute on chronic hypoxic respiratory failure with aspiration pneumonia in context of ILD.  Her medications include prednisone  40 mg daily and Bactrim  400-83 times weekly.  Since this time, she notes overall improvement.  She is now able to ambulate to the bathroom without becoming short of breath. She continues to be followed by pulmonology.  She was recommended that she resume her antifibrotics and start Cellcept  500 mg BID.  She currently requires 6 to 8 L of oxygen at home. - Follow-up pulmonology - Continue prednisone  40 mg daily and Bactrim 

## 2023-06-16 ENCOUNTER — Encounter: Payer: Self-pay | Admitting: Student

## 2023-06-16 NOTE — Addendum Note (Signed)
 Addended by: Juel Nutley T on: 06/16/2023 02:24 PM   Modules accepted: Level of Service

## 2023-06-16 NOTE — Progress Notes (Signed)
 Internal Medicine Clinic Attending  Case discussed with the resident at the time of the visit.  We reviewed the resident's history and exam and pertinent patient test results.  I agree with the assessment, diagnosis, and plan of care documented in the resident's note.

## 2023-06-16 NOTE — Progress Notes (Signed)
 Sent patient MyChart message discussing lab results. A1c stable at 6.1% from 5.9% despite prednisone  treatment. Will follow-up in three months and recheck these values.

## 2023-06-17 ENCOUNTER — Ambulatory Visit: Payer: No Typology Code available for payment source | Admitting: Adult Health

## 2023-06-17 ENCOUNTER — Other Ambulatory Visit: Payer: Self-pay | Admitting: Student

## 2023-06-18 ENCOUNTER — Other Ambulatory Visit (HOSPITAL_COMMUNITY): Payer: Self-pay

## 2023-06-18 MED ORDER — PANTOPRAZOLE SODIUM 40 MG PO TBEC
40.0000 mg | DELAYED_RELEASE_TABLET | Freq: Every day | ORAL | 0 refills | Status: DC
  Filled 2023-06-18: qty 60, 60d supply, fill #0

## 2023-06-20 ENCOUNTER — Ambulatory Visit: Payer: No Typology Code available for payment source | Admitting: Adult Health

## 2023-06-20 ENCOUNTER — Other Ambulatory Visit (HOSPITAL_COMMUNITY): Payer: Self-pay

## 2023-06-20 ENCOUNTER — Telehealth: Payer: Self-pay | Admitting: Adult Health

## 2023-06-20 ENCOUNTER — Encounter: Payer: Self-pay | Admitting: Adult Health

## 2023-06-20 VITALS — BP 118/70 | HR 90 | Ht <= 58 in | Wt 141.2 lb

## 2023-06-20 DIAGNOSIS — J9611 Chronic respiratory failure with hypoxia: Secondary | ICD-10-CM | POA: Insufficient documentation

## 2023-06-20 DIAGNOSIS — J849 Interstitial pulmonary disease, unspecified: Secondary | ICD-10-CM

## 2023-06-20 DIAGNOSIS — J8489 Other specified interstitial pulmonary diseases: Secondary | ICD-10-CM

## 2023-06-20 LAB — CBC WITH DIFFERENTIAL/PLATELET
Basophils Absolute: 0 10*3/uL (ref 0.0–0.1)
Basophils Relative: 0.2 % (ref 0.0–3.0)
Eosinophils Absolute: 0.2 10*3/uL (ref 0.0–0.7)
Eosinophils Relative: 1.1 % (ref 0.0–5.0)
HCT: 40.5 % (ref 36.0–46.0)
Hemoglobin: 13.8 g/dL (ref 12.0–15.0)
Lymphocytes Relative: 11.2 % — ABNORMAL LOW (ref 12.0–46.0)
Lymphs Abs: 2 10*3/uL (ref 0.7–4.0)
MCHC: 34 g/dL (ref 30.0–36.0)
MCV: 92.3 fL (ref 78.0–100.0)
Monocytes Absolute: 0.9 10*3/uL (ref 0.1–1.0)
Monocytes Relative: 5.1 % (ref 3.0–12.0)
Neutro Abs: 14.9 10*3/uL — ABNORMAL HIGH (ref 1.4–7.7)
Neutrophils Relative %: 82.4 % — ABNORMAL HIGH (ref 43.0–77.0)
Platelets: 310 10*3/uL (ref 150.0–400.0)
RBC: 4.39 Mil/uL (ref 3.87–5.11)
RDW: 12.6 % (ref 11.5–15.5)
WBC: 18.1 10*3/uL (ref 4.0–10.5)

## 2023-06-20 LAB — HEPATIC FUNCTION PANEL
ALT: 18 U/L (ref 0–35)
AST: 19 U/L (ref 0–37)
Albumin: 4 g/dL (ref 3.5–5.2)
Alkaline Phosphatase: 55 U/L (ref 39–117)
Bilirubin, Direct: 0.1 mg/dL (ref 0.0–0.3)
Total Bilirubin: 0.5 mg/dL (ref 0.2–1.2)
Total Protein: 7.3 g/dL (ref 6.0–8.3)

## 2023-06-20 LAB — COMPREHENSIVE METABOLIC PANEL
ALT: 18 U/L (ref 0–35)
AST: 19 U/L (ref 0–37)
Albumin: 4 g/dL (ref 3.5–5.2)
Alkaline Phosphatase: 55 U/L (ref 39–117)
BUN: 8 mg/dL (ref 6–23)
CO2: 28 meq/L (ref 19–32)
Calcium: 9.3 mg/dL (ref 8.4–10.5)
Chloride: 103 meq/L (ref 96–112)
Creatinine, Ser: 0.46 mg/dL (ref 0.40–1.20)
GFR: 105.87 mL/min (ref >60.00)
Glucose, Bld: 101 mg/dL — ABNORMAL HIGH (ref 70–99)
Potassium: 3.1 meq/L — ABNORMAL LOW (ref 3.5–5.1)
Sodium: 139 meq/L (ref 135–145)
Total Bilirubin: 0.5 mg/dL (ref 0.2–1.2)
Total Protein: 7.3 g/dL (ref 6.0–8.3)

## 2023-06-20 MED ORDER — POTASSIUM CHLORIDE CRYS ER 10 MEQ PO TBCR
10.0000 meq | EXTENDED_RELEASE_TABLET | Freq: Every day | ORAL | 0 refills | Status: DC
  Filled 2023-06-20: qty 30, 30d supply, fill #0

## 2023-06-20 NOTE — Telephone Encounter (Signed)
Critical white count from 06/20/23 : 18.1.  Routing to [employer] as she ordered these labs

## 2023-06-20 NOTE — Progress Notes (Signed)
@Patient  ID: Olivia [name], female    DOB: 1966/04/19, 57 y.o.   MRN: [id number]  Chief Complaint  Patient presents with   Follow-up    Referring provider: Faith Rogue, DO  HPI: 58 year old female never smoker followed for progressive fibrotic NSIP and chronic respiratory failure   TEST/EVENTS :  03/2023 -presented at ILD conference today, diagnosis with progressive fibrotic NSIP. Recommendations were to restart antifibrotics and start immunosuppressant medication such as CellCept.   02/2023 -hospitalized for respiratory failure and pulmonary fibrosis. She was discharged on high dose prednisone and Bactrim.   06/20/2023 Follow up ; ILD , O2 RF -visit today is done with a translator and family present for today's visit Patient returns for a 8-month follow-up.  Patient has progressive fibrotic NSIP and chronic respiratory failure.  As above she was hospitalized for acute respiratory failure and progressive pulmonary fibrosis in October discharged on high-dose steroids with Bactrim for PJP.  Her case was presented at the interstitial lung disease conference in November with recommendations to restart antifibrotic therapy.  She had been intolerant to Esbriet.  She was recommended to start on Ofev.  And CellCept.  She has recently gotten Ofev but has not started it.  She is still waiting for approval of CellCept.  She has barriers to healthcare with lack of insurance.  Our pharmacy team has been working on getting CellCept approved for patient assistance.  She is currently on 5 L of oxygen with O2 saturations at 100% at rest.  Walk test today in the office requires oxygen at 8 L to maintain O2 saturations greater than 88 to 90%.  She does have a concentrator that goes up to 10 L at home.  She does use DuoNeb twice daily which she says does help her breathing.  She does remain on prednisone 40 mg daily.  She is concerned about continuing on such high doses of steroids and her blood sugars.   We did discuss starting on Ofev..  Patient says overall she gets short of breath with minimum activities.  Does have a daily minimally productive cough.  No hemoptysis, chest pain, orthopnea or increased edema.  Does complain that she gets rib pain especially on the right side at times especially if she moves around around the lower part of her right back and ribs.  Denies any rash or hemoptysis.            Allergies  Allergen Reactions   Miralax [Polyethylene Glycol] Rash    Immunization History  Administered Date(s) Administered   Influenza, Seasonal, Injecte, Preservative Fre 02/20/2023   Influenza,inj,Quad PF,6+ Mos 03/20/2018, 06/28/2019, 02/22/2020, 02/12/2021   PFIZER(Purple Top)SARS-COV-2 Vaccination 09/24/2019, 10/15/2019   PNEUMOCOCCAL CONJUGATE-20 02/20/2023   Td 05/06/1996   Tdap 01/15/2013    Past Medical History:  Diagnosis Date   Acute respiratory distress 03/27/2020   Elevated blood pressure reading 12/21/2018   GERD (gastroesophageal reflux disease)    Healthcare maintenance 06/28/2019   Per patient's recent discharge summary, FOBT positive in ED. Given her overall prognosis and health condition, will hold off discussing colonoscopy.   History of COVID-19 09/02/2019   05/03/19 - Sarscov2 - positive  Did not get mab  Did not get hosp   Preventative health care 01/15/2013   Pulmonary fibrosis (HCC)    Screening breast examination 11/26/2018    Tobacco History: Social History   Tobacco Use  Smoking Status Never   Passive exposure: Never  Smokeless Tobacco Never   Counseling given: Not Answered  Outpatient Medications Prior to Visit  Medication Sig Dispense Refill   albuterol (PROVENTIL) (2.5 MG/3ML) 0.083% nebulizer solution Inhale 3 mLs into the lungs by nebulizer every 4 (four) hours as needed for wheezing or shortness of breath. 75 mL 12   benzonatate (TESSALON) 100 MG capsule Take 1 capsule (100 mg total) by mouth 3 (three) times daily. 20 capsule 0    guaiFENesin-dextromethorphan (ROBITUSSIN DM) 100-10 MG/5ML syrup Take 5 mLs by mouth every 4 (four) hours as needed for cough. 118 mL 0   ipratropium-albuterol (DUONEB) 0.5-2.5 (3) MG/3ML SOLN Take 3 mLs by nebulization 3 (three) times daily. 360 mL 0   mycophenolate (CELLCEPT) 500 MG tablet Take 1 tablet (500 mg total) by mouth 2 (two) times daily. 60 tablet 1   pantoprazole (PROTONIX) 40 MG tablet Take 1 tablet (40 mg total) by mouth daily. 60 tablet 0   predniSONE (DELTASONE) 20 MG tablet Take 2 tablets (40 mg total) by mouth daily with breakfast. 90 tablet 3   rosuvastatin (CRESTOR) 20 MG tablet Take 1 tablet (20 mg total) by mouth daily. 30 tablet 11   sodium chloride (OCEAN) 0.65 % SOLN nasal spray Place 1 spray into both nostrils 3 (three) times daily. 88 mL 0   sulfamethoxazole-trimethoprim (BACTRIM) 400-80 MG tablet Take 1 tablet by mouth 3 (three) times a week. 90 tablet 3   potassium chloride (KLOR-CON 10) 10 MEQ tablet Take 2 tablets (20 mEq total) by mouth daily for 10 doses. 20 tablet 0   No facility-administered medications prior to visit.     Review of Systems:   Constitutional:   No  weight loss, night sweats,  Fevers, chills, +fatigue, or  lassitude.  HEENT:   No headaches,  Difficulty swallowing,  Tooth/dental problems, or  Sore throat,                No sneezing, itching, ear ache, nasal congestion, post nasal drip,   CV:  No chest pain,  Orthopnea, PND, swelling in lower extremities, anasarca, dizziness, palpitations, syncope.   GI  No heartburn, indigestion, abdominal pain, nausea, vomiting, diarrhea, change in bowel habits, loss of appetite, bloody stools.   Resp:   No wheezing.  No chest wall deformity  Skin: no rash or lesions.  GU: no dysuria, change in color of urine, no urgency or frequency.  No flank pain, no hematuria   MS:  No joint pain or swelling.  No decreased range of motion.  No back pain.    Physical Exam  BP 118/70 (BP Location: Left Arm,  Patient Position: Sitting, Cuff Size: Large)   Pulse 90   Ht 4\' 9"  (1.448 m)   Wt 141 lb 3.2 oz (64 kg)   LMP  (LMP Unknown)   SpO2 100%   BMI 30.56 kg/m   GEN: A/Ox3; pleasant , NAD, chronically ill-appearing, on oxygen   HEENT:  [place]/AT,  NOSE-clear, THROAT-clear, no lesions, no postnasal drip or exudate noted.   NECK:  Supple w/ fair ROM; no JVD; normal carotid impulses w/o bruits; no thyromegaly or nodules palpated; no lymphadenopathy.    RESP bibasilar crackles  no accessory muscle use, no dullness to percussion  CARD:  RRR, no m/r/g, no peripheral edema, pulses intact, no cyanosis or clubbing.  GI:   Soft & nt; nml bowel sounds; no organomegaly or masses detected.   Musco: Warm bil, no deformities or joint swelling noted.   Neuro: alert, no focal deficits noted.    Skin: Warm, no lesions  or rashes    Lab Results:  CBC      Imaging: No results found.  Administration History     None           No data to display          No results found for: "NITRICOXIDE"      Assessment & Plan:   NSIP (nonspecific interstitial pneumonia) (HCC) Progressive NSIP with significant symptom burden.  Patient has been started on high-dose prednisone and Bactrim.  Awaiting approval of CellCept..  Will reach out to our pharmacy team to check on progress.  For now decrease prednisone to 30 mg daily have close follow-up in 4 weeks.  Continue on Bactrim 3 days a week on Monday Wednesday Friday.  She has received her Ofev and recommend starting this slightly previously had some difficulty tolerating antifibrotic therapy.  Would recommend beginning Ofev once daily.  Check labs with hepatic function today.  Plan  Patient Instructions  Decrease Prednisone 30mg  daily.  Continue on Bactrim three days a week. Monday/Wednesday and Friday.  I will check on the Cellcept medication approval /assistance  Continue on Oxygen 5l/m at rest and 8 l/m with walking .  Begin OFEV 1 daily  with food for 1 week then Twice daily  .  Labs today  Follow up with Dr. Isaiah Serge in 4 weeks and As needed         Chronic respiratory failure with hypoxia (HCC) Continue on oxygen to maintain O2 saturations greater than 88 to 90%.  Walk test today done the office requires 8 L of oxygen with ambulation  Plan Patient Instructions  Decrease Prednisone 30mg  daily.  Continue on Bactrim three days a week. Monday/Wednesday and Friday.  I will check on the Cellcept medication approval /assistance  Continue on Oxygen 5l/m at rest and 8 l/m with walking .  Begin OFEV 1 daily with food for 1 week then Twice daily  .  Labs today  Follow up with Dr. Isaiah Serge in 4 weeks and As needed          I spent 45    minutes dedicated to the care of this patient on the date of this encounter to include pre-visit review of records, face-to-face time with the patient discussing conditions above, post visit ordering of testing, clinical documentation with the electronic health record, making appropriate referrals as documented, and communicating necessary findings to members of the patients care team.   Rubye Oaks, NP 06/20/2023

## 2023-06-20 NOTE — Telephone Encounter (Signed)
Henderson Cloud calling with critical lab. [place] phone number is [phone].

## 2023-06-20 NOTE — Assessment & Plan Note (Signed)
Progressive NSIP with significant symptom burden.  Patient has been started on high-dose prednisone and Bactrim.  Awaiting approval of CellCept..  Will reach out to our pharmacy team to check on progress.  For now decrease prednisone to 30 mg daily have close follow-up in 4 weeks.  Continue on Bactrim 3 days a week on Monday Wednesday Friday.  She has received her Ofev and recommend starting this slightly previously had some difficulty tolerating antifibrotic therapy.  Would recommend beginning Ofev once daily.  Check labs with hepatic function today.  Plan  Patient Instructions  Decrease Prednisone 30mg  daily.  Continue on Bactrim three days a week. Monday/Wednesday and Friday.  I will check on the Cellcept medication approval /assistance  Continue on Oxygen 5l/m at rest and 8 l/m with walking .  Begin OFEV 1 daily with food for 1 week then Twice daily  .  Labs today  Follow up with Dr. Isaiah Serge in 4 weeks and As needed

## 2023-06-20 NOTE — Telephone Encounter (Signed)
Wbc is elevated most likely due to high steroids , will continue to follow .  No infectious symptoms at exam.  Will need to continue to follow closely. Potassium was slightly low at on lab work. Recommend potassium daily. Needs follow up with PCP for ongoing management  and labs  Rxs sent to pharm  Unclear why it is low  Attempted to call no answer

## 2023-06-20 NOTE — Assessment & Plan Note (Signed)
Continue on oxygen to maintain O2 saturations greater than 88 to 90%.  Walk test today done the office requires 8 L of oxygen with ambulation  Plan Patient Instructions  Decrease Prednisone 30mg  daily.  Continue on Bactrim three days a week. Monday/Wednesday and Friday.  I will check on the Cellcept medication approval /assistance  Continue on Oxygen 5l/m at rest and 8 l/m with walking .  Begin OFEV 1 daily with food for 1 week then Twice daily  .  Labs today  Follow up with Dr. Isaiah Serge in 4 weeks and As needed

## 2023-06-20 NOTE — Telephone Encounter (Signed)
Received a fax from  [facility] regarding an approval for OFEV patient assistance from 06/02/2023 to 06/01/2024. Approval letter sent to scan center.  Phone #: [phone] Fax #: [phone]  Cellcept application still pending  Chesley Mires, PharmD, MPH, BCPS, CPP Clinical Pharmacist (Rheumatology and Pulmonology)

## 2023-06-20 NOTE — Progress Notes (Signed)
You saw recently, lab was drawn under my name

## 2023-06-20 NOTE — Patient Instructions (Addendum)
Decrease Prednisone 30mg  daily.  Continue on Bactrim three days a week. Monday/Wednesday and Friday.  I will check on the Cellcept medication approval /assistance  Continue on Oxygen 5l/m at rest and 8 l/m with walking .  Begin OFEV 1 daily with food for 1 week then Twice daily  .  Labs today  Follow up with Dr. Isaiah Serge in 4 weeks and As needed

## 2023-06-23 ENCOUNTER — Telehealth: Payer: Self-pay | Admitting: Pharmacist

## 2023-06-23 ENCOUNTER — Telehealth: Payer: Self-pay | Admitting: Adult Health

## 2023-06-23 NOTE — Telephone Encounter (Signed)
-----   Message from Murrell Redden sent at 06/22/2023 12:55 PM EST ----- We are waiting for prescriber form for Cellcept. Was placed in Dr. Shirlee More mailbox on 04/28/2023. Will place another copy ----- Message ----- From: Julio Sicks, NP Sent: 06/20/2023   4:58 PM EST To: Chilton Greathouse, MD; Murrell Redden, RPH-CPP  Wanted to reach out to see if we had heard anything about the approval for her CellCept.  She remains on high-dose steroids since November I did decrease her dose down to 30 mg but want to see what our plan is for steroid sparing medication  Tammy Parrett NP

## 2023-06-23 NOTE — Telephone Encounter (Signed)
Signed patient form for Cellcept was received by patient in Jan 2025. Prescriber form still not returned.  Submitted Patient Assistance Application to [employer] for  CELLCEPT  along with provider portion, patient portion, medication list, and income documents. Will update patient when we receive a response.  Phone #: [phone] Fax #: [phone]  Chesley Mires, PharmD, MPH, BCPS, CPP Clinical Pharmacist (Rheumatology and Pulmonology)

## 2023-06-23 NOTE — Telephone Encounter (Signed)
ATC patient x2 with [employer], Interpreter [phone].  No answer, vm left to return call.

## 2023-06-24 ENCOUNTER — Telehealth: Payer: Self-pay | Admitting: Adult Health

## 2023-06-24 NOTE — Telephone Encounter (Signed)
Called the pt via [place] interpreter (ID number [phone])  There was no answer- LMTCB    If no call back will mail her a letter

## 2023-06-25 NOTE — Telephone Encounter (Signed)
Received form from [place] that appears to be specific to Cellcept.  Faxed to [phone] Phone: [phone]

## 2023-06-26 NOTE — Telephone Encounter (Signed)
Did we send a order for POC. She is on continuous high flow O2 . [facility] can you look at . Herbert Seta can you take a look at the order .   Also when did POC stop going up to 5-6l/m . Is that specific to [employer] and [facility] what is the maximum flow rate on their POC devices.

## 2023-06-26 NOTE — Telephone Encounter (Signed)
(  [employer])Garcia, Santina Evans, Roderick Pee, Jobe Igo; Angus Seller, [place] We no longer offer POC that go to 5L pulse.    I was also informed by [facility] that companies don't provide POC's to go up to 5L.

## 2023-06-27 NOTE — Telephone Encounter (Signed)
Olivia [name], when this patient was in the office on 06/20/23, she was walked on continuous flow oxygen. She requires 5L at rest and 8L with exertion.  She did not have a POC and there was no mention of a POC in the office note.  Please clarify.  Thank you.

## 2023-06-29 NOTE — Telephone Encounter (Signed)
Due to multiple attempts trying to reach pt without being able to do so, per protocol encounter will be closed. 

## 2023-07-01 NOTE — Telephone Encounter (Signed)
 I do not believe we ordered a POC .

## 2023-07-02 ENCOUNTER — Other Ambulatory Visit (HOSPITAL_COMMUNITY): Payer: Self-pay

## 2023-07-02 NOTE — Telephone Encounter (Signed)
 PCC's, please see Tammy's response regarding POC.

## 2023-07-04 NOTE — Telephone Encounter (Signed)
 Message sent to [employer] to make them aware

## 2023-07-04 NOTE — Telephone Encounter (Signed)
 Herbert Seta can you please reach out to patient to see what order we need to correct

## 2023-07-04 NOTE — Telephone Encounter (Signed)
 PCCs, I did not order a POC, this was just an update on her liter flow need.  She already has a home concentrator and portable tanks that meet her liter flow need.  There is no mention of a POC.  There is nothing for [employer] to do.

## 2023-07-11 NOTE — Telephone Encounter (Signed)
 Patient is calling for an update for her cellcept medication. She was told that there needs authorization from the doctor. Please call and advise.

## 2023-07-16 NOTE — Telephone Encounter (Signed)
 Called [facility] for update on Cellcept application. Per rep, they spoke to patient on 07/10/23 about needing patient form to be signed. Consent form for Cellcept completed and faxed   Fax: [phone] Phone: [phone]  Chesley Mires, PharmD, MPH, BCPS, CPP Clinical Pharmacist (Rheumatology and Pulmonology)

## 2023-07-17 ENCOUNTER — Other Ambulatory Visit: Payer: Self-pay | Admitting: Student

## 2023-07-17 DIAGNOSIS — J849 Interstitial pulmonary disease, unspecified: Secondary | ICD-10-CM

## 2023-07-18 ENCOUNTER — Other Ambulatory Visit (HOSPITAL_COMMUNITY): Payer: Self-pay

## 2023-07-18 MED ORDER — BENZONATATE 100 MG PO CAPS
100.0000 mg | ORAL_CAPSULE | Freq: Three times a day (TID) | ORAL | 0 refills | Status: AC
  Filled 2023-07-18: qty 20, 7d supply, fill #0

## 2023-07-31 NOTE — Telephone Encounter (Signed)
 Received a fax from  [place] regarding an approval for  CELLCEPT  patient assistance from 07/17/2023 to 07/16/2024. Approval letter sent to scan center.  Phone: [phone], option 3  Called patient using interpreter [phone]. Phone went to VM. Left VM with callback number for [facility] for patient to set up shipment.  Chesley Mires, PharmD, MPH, BCPS, CPP Clinical Pharmacist (Rheumatology and Pulmonology)

## 2023-08-01 ENCOUNTER — Ambulatory Visit (INDEPENDENT_AMBULATORY_CARE_PROVIDER_SITE_OTHER): Payer: No Typology Code available for payment source | Admitting: Adult Health

## 2023-08-01 ENCOUNTER — Encounter: Payer: Self-pay | Admitting: Adult Health

## 2023-08-01 ENCOUNTER — Ambulatory Visit

## 2023-08-01 VITALS — BP 108/80 | HR 54 | Temp 98.2°F | Ht <= 58 in | Wt 139.2 lb

## 2023-08-01 DIAGNOSIS — K219 Gastro-esophageal reflux disease without esophagitis: Secondary | ICD-10-CM

## 2023-08-01 DIAGNOSIS — J9611 Chronic respiratory failure with hypoxia: Secondary | ICD-10-CM

## 2023-08-01 DIAGNOSIS — Z7189 Other specified counseling: Secondary | ICD-10-CM

## 2023-08-01 DIAGNOSIS — J849 Interstitial pulmonary disease, unspecified: Secondary | ICD-10-CM

## 2023-08-01 DIAGNOSIS — Z7952 Long term (current) use of systemic steroids: Secondary | ICD-10-CM

## 2023-08-01 LAB — CBC WITH DIFFERENTIAL/PLATELET
Basophils Absolute: 0 10*3/uL (ref 0.0–0.1)
Basophils Relative: 0.4 % (ref 0.0–3.0)
Eosinophils Absolute: 0.2 10*3/uL (ref 0.0–0.7)
Eosinophils Relative: 2.3 % (ref 0.0–5.0)
HCT: 41.7 % (ref 36.0–46.0)
Hemoglobin: 14.1 g/dL (ref 12.0–15.0)
Lymphocytes Relative: 18.6 % (ref 12.0–46.0)
Lymphs Abs: 2 10*3/uL (ref 0.7–4.0)
MCHC: 33.9 g/dL (ref 30.0–36.0)
MCV: 94.5 fl (ref 78.0–100.0)
Monocytes Absolute: 0.7 10*3/uL (ref 0.1–1.0)
Monocytes Relative: 6.3 % (ref 3.0–12.0)
Neutro Abs: 7.9 10*3/uL — ABNORMAL HIGH (ref 1.4–7.7)
Neutrophils Relative %: 72.4 % (ref 43.0–77.0)
Platelets: 312 10*3/uL (ref 150.0–400.0)
RBC: 4.41 Mil/uL (ref 3.87–5.11)
RDW: 13.9 % (ref 11.5–15.5)
WBC: 10.9 10*3/uL — ABNORMAL HIGH (ref 4.0–10.5)

## 2023-08-01 LAB — COMPREHENSIVE METABOLIC PANEL WITH GFR
ALT: 17 U/L (ref 0–35)
AST: 23 U/L (ref 0–37)
Albumin: 4.1 g/dL (ref 3.5–5.2)
Alkaline Phosphatase: 55 U/L (ref 39–117)
BUN: 13 mg/dL (ref 6–23)
CO2: 28 meq/L (ref 19–32)
Calcium: 9.4 mg/dL (ref 8.4–10.5)
Chloride: 101 meq/L (ref 96–112)
Creatinine, Ser: 0.5 mg/dL (ref 0.40–1.20)
GFR: 103.68 mL/min (ref >60.00)
Glucose, Bld: 97 mg/dL (ref 70–99)
Potassium: 3.2 meq/L — ABNORMAL LOW (ref 3.5–5.1)
Sodium: 138 meq/L (ref 135–145)
Total Bilirubin: 0.5 mg/dL (ref 0.2–1.2)
Total Protein: 7.1 g/dL (ref 6.0–8.3)

## 2023-08-01 NOTE — Progress Notes (Signed)
 @Patient  ID: Olivia [name], female    DOB: 1965/07/12, 58 y.o.   MRN: [id number]  Chief Complaint  Patient presents with   Follow-up    Referring provider: Faith Rogue, DO  HPI: 58 year old female never smoker followed for progressive fibrotic NSIP and chronic respiratory failure requiring high flow oxygen  TEST/EVENTS :  02/2023 -hospitalized for A/C respiratory failure and progressive pulmonary fibrosis. She was discharged on high dose prednisone and Bactrim.   03/2023 -presented at ILD conference , diagnosis with progressive fibrotic NSIP. Recommendations were to restart antifibrotics and start immunosuppressant medication such as CellCept.   High-resolution CT 06/25/2018-widespread groundglass attenuation in the mid to lower lungs with nodular appearance.  Indeterminate for UIP. High-resolution CT 07/09/2019-similar findings of interstitial lung disease as before with slight improvement CTA 03/27/2020-groundglass opacitie obscured by motion artifact CTA 07/07/2021-interstitial and groundglass opacities with significant progression.  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  Currently stays at home Exposures: No mold, hot tub, Jacuzzi.  No feather pillows or comforters ILD questionnaire 12/27/2021-negative Smoking history: Never smoker Travel history: Immigrated from [place] in 2000.  No significant recent travel Relevant family history: Sister has asthma. Cough started 2019, CT chest 2020 ILD changes, referred to [facility]     08/01/2023 Follow up ; Progressive Fibrotic NSIP, O2 RF  Discussed the use of AI scribe software for clinical note transcription with the patient, who gave verbal consent to proceed. Patient is here with an interpreter that assist with today's visit  History of Present Illness   Olivia [name] is a 58 year old female with pulmonary fibrosis-progressive fibrotic NSIP who presents for  4-week follow-up.  She is followed for progressive fibrotic NSIP.  She was hospitalized for acute respiratory failure with progressive pulmonary fibrosis in October 2024.  Started on high-dose steroids along with Bactrim 3 days a week for PJP.  Her case was presented at the interstitial lung disease conference in November 2024 with recommendations to restart antifibrotic therapy.  She had previously been intolerant to Esbriet.  And also to begin CellCept.  Patient is uninsured.  Is going through patient assistance approval process for CellCept.  Has recently gotten approval and is expecting her shipment to be delivered today.  She has gotten confirmation that it is out for delivery. Last visit was restarted on Ofev 100 mg twice daily.  She says she is tolerating it well.  Has no nausea vomiting or diarrhea.  Appetite has been fair.  Her breathing is variable, with some days better than others. . Her oxygen requirements are 5 liters at rest and 7 liters when walking. She notes that her oxygen levels drop when she has increased mucus, which she believes contributes to her breathing difficulties. She mentions being able to shower without oxygen for about 15 minutes, although her oxygen levels drop during this time, it is not as severe as before.  We discussed avoiding prolonged times without her oxygen, patient education on potential complications of hypoxemia.  She experiences significant acid reflux, which is not adequately controlled by Protonix,  even at a twice-daily dose. She describes a burning sensation that  worsens after eating. She has previously seen a gastroenterologist for these symptoms and finds that Pepcid helps her condition.  We discussed starting Pepcid at bedtime.  She reports throat irritation, possibly due to mucus accumulation and coughing. She uses Mucinex DM every night to help with mucus.   She remains on prednisone 30 mg daily.  Patient education given on steroids.        Allergies  Allergen Reactions   Miralax [Polyethylene Glycol] Rash    Immunization History  Administered Date(s) Administered   Influenza, Seasonal, Injecte, Preservative Fre 02/20/2023   Influenza,inj,Quad PF,6+ Mos 03/20/2018, 06/28/2019, 02/22/2020, 02/12/2021   PFIZER(Purple Top)SARS-COV-2 Vaccination 09/24/2019, 10/15/2019   PNEUMOCOCCAL CONJUGATE-20 02/20/2023   Td 05/06/1996   Tdap 01/15/2013    Past Medical History:  Diagnosis Date   Acute respiratory distress 03/27/2020   Elevated blood pressure reading 12/21/2018   GERD (gastroesophageal reflux disease)    Healthcare maintenance 06/28/2019   Per patient's recent discharge summary, FOBT positive in ED. Given her overall prognosis and health condition, will hold off discussing colonoscopy.   History of COVID-19 09/02/2019   05/03/19 - Sarscov2 - positive  Did not get mab  Did not get hosp   Preventative health care 01/15/2013   Pulmonary fibrosis (HCC)    Screening breast examination 11/26/2018    Tobacco History: Social History   Tobacco Use  Smoking Status Never   Passive exposure: Never  Smokeless Tobacco Never   Counseling given: Not Answered   Outpatient Medications Prior to Visit  Medication Sig Dispense Refill   albuterol (PROVENTIL) (2.5 MG/3ML) 0.083% nebulizer solution Inhale 3 mLs into the lungs by nebulizer every 4 (four) hours as needed for wheezing or shortness of breath. 75 mL 12   benzonatate (TESSALON) 100 MG capsule Take 1 capsule (100 mg total) by mouth 3 (three) times daily. 20 capsule 0    guaiFENesin-dextromethorphan (ROBITUSSIN DM) 100-10 MG/5ML syrup Take 5 mLs by mouth every 4 (four) hours as needed for cough. 118 mL 0   ipratropium-albuterol (DUONEB) 0.5-2.5 (3) MG/3ML SOLN Take 3 mLs by nebulization 3 (three) times daily. 360 mL 0   pantoprazole (PROTONIX) 40 MG tablet Take 1 tablet (40 mg total) by mouth daily. 60 tablet 0   potassium chloride (KLOR-CON M) 10 MEQ tablet Take 1 tablet (10 mEq total) by mouth daily. 30 tablet 0   predniSONE (DELTASONE) 20 MG tablet Take 2 tablets (40 mg total) by mouth daily with breakfast. 90 tablet 3   rosuvastatin (CRESTOR) 20 MG tablet Take 1 tablet (20 mg total) by mouth daily. 30 tablet 11   sodium chloride (OCEAN) 0.65 % SOLN nasal spray Place 1 spray into both nostrils 3 (three) times daily. 88 mL 0   sulfamethoxazole-trimethoprim (BACTRIM) 400-80 MG tablet Take 1 tablet by mouth 3 (three) times a week. 90 tablet 3   mycophenolate (CELLCEPT) 500 MG tablet Take 1 tablet (500 mg total) by mouth 2 (two) times daily. (Patient not taking: Reported on 08/01/2023) 60 tablet 1   No facility-administered medications prior to visit.     Review of Systems:   Constitutional:   No  weight loss, night sweats,  Fevers, chills,+ fatigue, or  lassitude.  HEENT:   No headaches,  Difficulty swallowing,  Tooth/dental problems, or  Sore throat,                No sneezing, itching, ear ache, nasal congestion, post  nasal drip,   CV:  No chest pain,  Orthopnea, PND, swelling in lower extremities, anasarca, dizziness, palpitations, syncope.   GI  No heartburn, indigestion, abdominal pain, nausea, vomiting, diarrhea, change in bowel habits, loss of appetite, bloody stools.   Resp:   No chest wall deformity  Skin: no rash or lesions.  GU: no dysuria, change in color of urine, no urgency or frequency.  No flank pain, no hematuria   MS:  No joint pain or swelling.  No decreased range of motion.  No back pain.    Physical Exam  BP 108/80 (BP  Location: Right Arm, Patient Position: Sitting, Cuff Size: Normal)   Pulse (!) 54   Temp 98.2 F (36.8 C) (Oral)   Ht 4\' 9"  (1.448 m)   Wt 139 lb 3.2 oz (63.1 kg)   LMP  (LMP Unknown)   SpO2 91%   BMI 30.12 kg/m   GEN: A/Ox3; pleasant , NAD, chronically ill-appearing on oxygen   HEENT:  [place]/AT,  NOSE-clear, THROAT-clear, no lesions, no postnasal drip or exudate noted.   NECK:  Supple w/ fair ROM; no JVD; normal carotid impulses w/o bruits; no thyromegaly or nodules palpated; no lymphadenopathy.    RESP faint bibasilar crackles . no accessory muscle use, no dullness to percussion  CARD:  RRR, no m/r/g, no peripheral edema, pulses intact, no cyanosis or clubbing.  GI:   Soft & nt; nml bowel sounds; no organomegaly or masses detected.   Musco: Warm bil, no deformities or joint swelling noted.   Neuro: alert, no focal deficits noted.    Skin: Warm, no lesions or rashes    Lab Results:  CBC    Component Value Date/Time   WBC 18.1 Repeated and verified X2. (HH) 06/20/2023 1206   RBC 4.39 06/20/2023 1206   HGB 13.8 06/20/2023 1206   HGB 13.8 12/26/2016 1643   HCT 40.5 06/20/2023 1206   HCT 41.1 12/26/2016 1643   PLT 310.0 06/20/2023 1206   PLT 268 12/26/2016 1643   MCV 92.3 06/20/2023 1206   MCV 89 12/26/2016 1643   MCH 31.0 03/10/2023 0433   MCHC 34.0 06/20/2023 1206   RDW 12.6 06/20/2023 1206   RDW 13.0 12/26/2016 1643   LYMPHSABS 2.0 06/20/2023 1206   MONOABS 0.9 06/20/2023 1206   EOSABS 0.2 06/20/2023 1206   BASOSABS 0.0 06/20/2023 1206    BMET    Component Value Date/Time   NA 139 06/20/2023 1206   NA 140 03/13/2023 1405   K 3.1 (L) 06/20/2023 1206   CL 103 06/20/2023 1206   CO2 28 06/20/2023 1206   GLUCOSE 101 (H) 06/20/2023 1206   BUN 8 06/20/2023 1206   BUN 7 03/13/2023 1405   CREATININE 0.46 06/20/2023 1206   CREATININE 0.52 05/23/2014 0855   CALCIUM 9.3 06/20/2023 1206   GFRNONAA >60 03/10/2023 0433   GFRAA >60 08/22/2017 0546    BNP     Component Value Date/Time   BNP 38.6 03/05/2023 1016    ProBNP    Component Value Date/Time   PROBNP 84 10/10/2022 1514    Imaging: No results found.  Administration History     None           No data to display          No results found for: "NITRICOXIDE"      Assessment & Plan:   Assessment and Plan    Pulmonary fibrosis  -progressive fibrotic NSIP Continue on aggressive regimen with high-dose  steroids.  She is to continue on prednisone 30 mg daily.  Awaiting her to start CellCept and then will slowly lower steroids. She is undergoing treatment with Ofev for pulmonary fibrosis, which she tolerates well without nausea or diarrhea. Liver function tests are necessary to monitor for potential side effects. . Cellcept is expected to arrive today to help prevent further  immune-mediated lung damage. Her oxygen therapy is set at 5 liters at rest and 7 liters during ambulation, maintaining saturation levels above 88-90%. Order liver function tests, continue Ofev, maintain prednisone at 30 mg until Cellcept is started, and initiate Cellcept upon arrival. Order a chest x-ray and plan a future CT scan at follow up visit.   Chronic respiratory failure.  Continue on oxygen 6 L at rest.  7 to 8 L with walking to maintain O2 saturations greater than 88 to 90%.  Chronic steroid use.  Continue Bactrim 3 days a week for PJP.  High risk medication-continue ongoing management.  Lab work today.  Leukocytosis-noted on last CBC.  Could be steroid related.  Repeat CBC today.   Cough and throat irritation  -continue symptom management She experiences throat irritation, likely due to mucus accumulation and coughing.  Changed to liquid Mucinex DM to two teaspoons twice daily as needed and encourage frequent sips of water.  Gastroesophageal reflux disease (GERD)   She reports persistent reflux symptoms despite Protonix twice daily, experiencing burning sensations postprandially.  Protonix is the strongest available medication, and Pepcid will be added at bedtime. Referral to a gastroenterologist will be considered if symptoms persist. Add Pepcid 20 mg at bedtime, continue Protonix twice daily, and refer to a gastroenterologist if symptoms persist.      I spent 42   minutes dedicated to the care of this patient on the date of this encounter to include pre-visit review of records, face-to-face time with the patient discussing conditions above, post visit ordering of testing, clinical documentation with the electronic health record, making appropriate referrals as documented, and communicating necessary findings to members of the patients care team.    Rubye Oaks, NP 08/01/2023

## 2023-08-01 NOTE — Patient Instructions (Addendum)
 Continue on Prednisone 30mg  daily.  Continue on Bactrim three days a week. Monday/Wednesday and Friday.  Begin Cellcept as directed. -hopefully will be delivered today.  Continue on Oxygen 6l/m at rest and 8 l/m with walking .  Continue on OFEV twice daily  Continue on Protonix 40mg  Twice daily   Add Pepcid 20mg  At bedtime   If reflux is not getting better will need to see Dr. Ewing Schlein, GI.  Liquid Mucinex DM 2 tsp Twice daily ,as needed for cough/congestion.  Chest xray and Labs today  Follow up with Dr. Isaiah Serge in 4 -6 weeks and As needed

## 2023-08-04 ENCOUNTER — Other Ambulatory Visit (HOSPITAL_COMMUNITY): Payer: Self-pay

## 2023-08-04 MED ORDER — POTASSIUM CHLORIDE CRYS ER 20 MEQ PO TBCR
20.0000 meq | EXTENDED_RELEASE_TABLET | Freq: Every day | ORAL | 1 refills | Status: DC
  Filled 2023-08-04: qty 30, 30d supply, fill #0
  Filled 2023-10-29: qty 30, 30d supply, fill #1

## 2023-08-04 NOTE — Progress Notes (Signed)
 Called patient through interpreter [phone]), advised of results/recommendations per Rubye Oaks NP.  She verbalized understanding.  Nothing further needed.

## 2023-08-04 NOTE — Addendum Note (Signed)
 Addended by: Julio Sicks on: 08/04/2023 09:06 AM   Modules accepted: Orders

## 2023-08-13 ENCOUNTER — Other Ambulatory Visit (HOSPITAL_COMMUNITY): Payer: Self-pay

## 2023-08-13 ENCOUNTER — Other Ambulatory Visit: Payer: Self-pay | Admitting: Student

## 2023-08-13 DIAGNOSIS — J849 Interstitial pulmonary disease, unspecified: Secondary | ICD-10-CM

## 2023-08-14 ENCOUNTER — Other Ambulatory Visit (HOSPITAL_COMMUNITY): Payer: Self-pay

## 2023-08-14 ENCOUNTER — Other Ambulatory Visit: Payer: Self-pay

## 2023-08-14 MED ORDER — IPRATROPIUM-ALBUTEROL 0.5-2.5 (3) MG/3ML IN SOLN
3.0000 mL | Freq: Three times a day (TID) | RESPIRATORY_TRACT | 0 refills | Status: DC
Start: 2023-08-14 — End: 2023-10-29
  Filled 2023-08-14: qty 360, 40d supply, fill #0

## 2023-08-19 NOTE — Telephone Encounter (Signed)
 Error

## 2023-08-28 ENCOUNTER — Other Ambulatory Visit (HOSPITAL_COMMUNITY): Payer: Self-pay

## 2023-09-09 ENCOUNTER — Other Ambulatory Visit: Payer: Self-pay | Admitting: Pharmacist

## 2023-09-09 MED ORDER — MYCOPHENOLATE MOFETIL 500 MG PO TABS: 500.0000 mg | ORAL_TABLET | Freq: Two times a day (BID) | ORAL | 1 refills | Status: DC

## 2023-09-16 ENCOUNTER — Other Ambulatory Visit (HOSPITAL_COMMUNITY): Payer: Self-pay

## 2023-09-17 ENCOUNTER — Ambulatory Visit (INDEPENDENT_AMBULATORY_CARE_PROVIDER_SITE_OTHER): Payer: Self-pay | Admitting: Pulmonary Disease

## 2023-09-17 ENCOUNTER — Encounter: Payer: Self-pay | Admitting: Pulmonary Disease

## 2023-09-17 ENCOUNTER — Other Ambulatory Visit (HOSPITAL_COMMUNITY): Payer: Self-pay

## 2023-09-17 VITALS — BP 110/78 | HR 114 | Ht <= 58 in | Wt 139.2 lb

## 2023-09-17 DIAGNOSIS — Z5181 Encounter for therapeutic drug level monitoring: Secondary | ICD-10-CM

## 2023-09-17 DIAGNOSIS — J849 Interstitial pulmonary disease, unspecified: Secondary | ICD-10-CM

## 2023-09-17 DIAGNOSIS — Z9981 Dependence on supplemental oxygen: Secondary | ICD-10-CM

## 2023-09-17 MED ORDER — SULFAMETHOXAZOLE-TRIMETHOPRIM 400-80 MG PO TABS
1.0000 | ORAL_TABLET | ORAL | 9 refills | Status: AC
  Filled 2023-09-17 – 2023-10-29 (×2): qty 36, 84d supply, fill #0
  Filled 2023-12-23: qty 36, 84d supply, fill #1
  Filled 2024-04-23: qty 36, 84d supply, fill #2
  Filled 2024-06-09: qty 36, 84d supply, fill #3

## 2023-09-17 MED ORDER — PREDNISONE 20 MG PO TABS
20.0000 mg | ORAL_TABLET | Freq: Every day | ORAL | 3 refills | Status: AC
  Filled 2023-09-17: qty 90, 90d supply, fill #0
  Filled 2023-12-23: qty 90, 90d supply, fill #1
  Filled 2024-03-29: qty 90, 90d supply, fill #2
  Filled 2024-06-09: qty 90, 90d supply, fill #3

## 2023-09-17 NOTE — Patient Instructions (Addendum)
 VISIT SUMMARY:  Today, we discussed the management of your interstitial lung disease and reviewed your current medications and treatments. We also talked about your oxygen therapy, potential lung transplant, and other health concerns.  YOUR PLAN:  -INTERSTITIAL LUNG DISEASE: Interstitial lung disease is a group of disorders that cause scarring of the lungs, making it hard to breathe. You are currently on CellCept  and Ofev to manage lung inflammation. We will increase your CellCept  dose to 1000 mg twice daily and monitor with blood tests.  -PREDNISONE  THERAPY: Prednisone  is a steroid used to reduce inflammation. You will continue taking 30 mg daily, with a plan to reduce to 20 mg  -OXYGEN THERAPY: You need oxygen therapy to help you breathe. Continue using 8 liters per minute when walking and 4 liters per minute when sitting, and use a humidifier to prevent dryness.  -PULMONARY REHABILITATION REFERRAL: Pulmonary rehabilitation is a program to help improve your lung function and fitness. You will be referred to a program that involves supervised exercise twice a week for 8-12 weeks.  -POTENTIAL LUNG TRANSPLANT: A lung transplant may be needed in the future. We will refer you to [facility] for an evaluation, but please be aware that insurance may be a barrier.  -PROPHYLACTIC ANTIBIOTIC USE: You are taking Bactrim  to prevent a specific type of pneumonia. We will prescribe a three-month supply with three refills to reduce pharmacy visits.  -ACID REFLUX AND HIATAL HERNIA: Acid reflux and hiatal hernia are conditions where stomach acid flows back into the esophagus. Your symptoms are well controlled with Protonix , and no changes are needed.  INSTRUCTIONS:  Please follow up with the blood tests and CT scan before your next appointment. Continue using your medications as prescribed and attend the pulmonary rehabilitation program once referred. We will also refer you to [facility] for a lung transplant  evaluation.  -------------------------------------------------  RESUMEN DE LA VISITA:  Hoy hablamos sobre el manejo de su enfermedad pulmonar intersticial y revisamos sus medicamentos y tratamientos actuales. Tambin hablamos sobre su oxigenoterapia, un posible trasplante de pulmn y otras inquietudes de salud.  SU PLAN:  - ENFERMEDAD PULMONAR INTERSTICIAL: La enfermedad pulmonar intersticial es un grupo de trastornos que causan cicatrices en los pulmones, lo que dificulta la respiracin. Actualmente est tomando CellCept  y Ofev para controlar la inflamacin pulmonar. Aumentaremos su dosis de CellCept  a 1000 mg dos veces al da y le realizaremos anlisis de [place].  - TERAPIA CON PREDNISONA: La prednisona es un esteroide que se usa  para reducir la inflamacin. Continuar tomando 30 mg al da, con un plan para reducir la dosis a 20 mg.  - OXIGENOTERAPIA: Necesita oxigenoterapia para ayudarle a respirar. Contine usando 8 litros por minuto al caminar y 4 litros por minuto al sentarse, y use un humidificador para prevenir la sequedad.  -DERIVACIN PARA REHABILITACIN PULMONAR: La rehabilitacin pulmonar es un programa para ayudar a mejorar su funcin pulmonar y su estado fsico. Se le derivar a un programa que incluye ejercicio supervisado dos veces por semana durante 8 a 12 semanas.  -POSIBLE TRASPLANTE DE PULMN: Es posible que necesite un trasplante de pulmn en el futuro. Lo derivaremos a [facility] para una evaluacin, pero tenga en cuenta que el seguro mdico podra ser un obstculo.  -USO PROFILCTICO DE ANTIBITICOS: Est tomando Bactrim  para prevenir un tipo especfico de neumona. Le recetaremos un suministro para tres meses con tres resurtidos para reducir las visitas a [employer]/textile technologist.  -REFLUJO CIDO Y HERNIA DE HIATO: El reflujo cido y la hernia  de hiato son afecciones en las que el cido del estmago se acumula en el esfago. Sus sntomas estn bien controlados con Protonix  y no se  requieren cambios.  INSTRUCCIONES:  Por favor, realice anlisis de sangre de seguimiento y Ebb Goldman tomografa computarizada antes de su prxima cita. Contine tomando sus medicamentos segn lo recetado y asista al programa de rehabilitacin pulmonar una vez derivado. Tambin lo derivaremos a [facility] para una evaluacin de trasplante de pulmn.

## 2023-09-17 NOTE — Progress Notes (Addendum)
 ROCQUEL DAPPER    [id number]    Jan 25, 1966  Primary Care Physician:Bender, Sherline Distel, DO  Referring Physician: Aurora Lees, DO [address] [place],  [place] [address]  Chief complaint:  Follow-up for interstitial lung disease NSIP fibrosis Did not tolerate pirfenidone  2024 Started nintedanib and CellCept  in February 2025  HPI: 58 y.o. who  has a past medical history of Acute respiratory distress (03/27/2020), Elevated blood pressure reading (12/21/2018), GERD (gastroesophageal reflux disease), Healthcare maintenance (06/28/2019), History of COVID-19 (09/02/2019), Preventative health care (01/15/2013), Pulmonary fibrosis (HCC), and Screening breast examination (11/26/2018).   Cough started in 2019 after she had a fall.  Was seen in 2020 with a CT scan showing interstitial lung disease and was referred to pulmonary clinic.  She has been placed on prednisone  since then with minimal improvement in symptoms.  Recent chest x-ray shows worsening ILD and has been referred to ILD clinic for further evaluation  Complains of chronic cough, chest congestion and dyspnea for the past 3 years.  Denies any joint pain or rash.  She has occasional difficulty swallowing.  Has dry mouth, dry eyes.  Started pirfenidone  on end of Jan 2024.  But she stopped it after few months due to severe GI side effects, weight loss She was hospitalized in October 2024 for for respiratory failure and ILD exacerbation. She was discharged on high dose prednisone  and Bactrim .  Postdischarge she continues to make slow improvements.  Remains on supplemental oxygen, prednisone  at 40 mg/day and Bactrim   Her case was presented at ILD conference in Nov 2024 with diagnosis with progressive fibrotic NSIP. Recommendations were to restart antifibrotics and start immunosuppressant medication such as CellCept .   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  Currently  stays at home Exposures: No mold, hot tub, Jacuzzi.  No feather pillows or comforters ILD questionnaire 12/27/2021-negative Smoking history: Never smoker Travel history: Immigrated from [place] in 2000.  No significant recent travel Relevant family history: Sister has asthma.  Interim history: Discussed the use of AI scribe software for clinical note transcription with the patient, who gave verbal consent to proceed.  History of Present Illness JENASYS JIMMERSON is a 58 year old female with interstitial lung disease who presents for follow-up of her condition.  She has been diagnosed with non-specific interstitial pneumonia (NSIP) and started treatment with CellCept  and Ofev. CellCept  was initiated about a month ago at a dose of 500 mg twice daily, and Ofev was started three months ago at a dose of 150 mg twice daily. She tolerates Ofev well, with only two episodes of diarrhea. Previously, she tried pirfenidone  in 2024, which caused significant stomach side effects. She is currently on prednisone , reduced to 30 mg daily after starting CellCept . She also takes Bactrim  three times a week as a prophylactic antibiotic to prevent Pneumocystis pneumonia, but has experienced issues with pharmacy refills due to authorization requirements.  She has a history of acid reflux and a hiatal hernia, for which she takes Protonix . Her acid reflux is under control and does not cause significant problems. She uses oxygen therapy, requiring 8 liters per minute when walking and 4 liters per minute when sitting, and uses a humidifier to prevent dryness.  Before her lung condition worsened, she was very active, cleaning houses and exercising regularly, but her activity level has significantly declined since her symptoms began. She experiences heart palpitations at times. Her  social history includes reliance on her husband for transportation to the pharmacy, as he is the only one working. She has insurance through an  'orange card'.  She feels tired and agitated after active days.    Outpatient Encounter Medications as of 09/17/2023  Medication Sig   albuterol  (PROVENTIL ) (2.5 MG/3ML) 0.083% nebulizer solution Inhale 3 mLs into the lungs by nebulizer every 4 (four) hours as needed for wheezing or shortness of breath.   guaiFENesin -dextromethorphan  (ROBITUSSIN DM) 100-10 MG/5ML syrup Take 5 mLs by mouth every 4 (four) hours as needed for cough.   ipratropium-albuterol  (DUONEB) 0.5-2.5 (3) MG/3ML SOLN Take 3 mLs by nebulization 3 (three) times daily.   mycophenolate  (CELLCEPT ) 500 MG tablet Take 1 tablet (500 mg total) by mouth 2 (two) times daily.   potassium chloride  SA (KLOR-CON  M) 20 MEQ tablet Take 1 tablet (20 mEq total) by mouth daily.   predniSONE  (DELTASONE ) 20 MG tablet Take 2 tablets (40 mg total) by mouth daily with breakfast.   rosuvastatin  (CRESTOR ) 20 MG tablet Take 1 tablet (20 mg total) by mouth daily.   sodium chloride  (OCEAN) 0.65 % SOLN nasal spray Place 1 spray into both nostrils 3 (three) times daily.   sulfamethoxazole -trimethoprim  (BACTRIM ) 400-80 MG tablet Take 1 tablet by mouth 3 (three) times a week.   benzonatate  (TESSALON ) 100 MG capsule Take 1 capsule (100 mg total) by mouth 3 (three) times daily. (Patient not taking: Reported on 09/17/2023)   pantoprazole  (PROTONIX ) 40 MG tablet Take 1 tablet (40 mg total) by mouth daily.   No facility-administered encounter medications on file as of 09/17/2023.   Physical Exam: Blood pressure 110/78, pulse (!) 114, height 4\' 9"  (1.448 m), weight 139 lb 3.2 oz (63.1 kg), SpO2 95%. Gen:      No acute distress HEENT:  EOMI, sclera anicteric Neck:     No masses; no thyromegaly Lungs:    Bibasal crackles CV:         Regular rate and rhythm; no murmurs Abd:      + bowel sounds; soft, non-tender; no palpable masses, no distension Ext:    No edema; adequate peripheral perfusion Skin:      Warm and dry; no rash Neuro: alert and oriented x 3 Psych:  normal mood and affect   Data Reviewed: Imaging: High-resolution CT 06/25/2018-widespread groundglass attenuation in the mid to lower lungs with nodular appearance.  Indeterminate for UIP. High-resolution CT 07/09/2019-similar findings of interstitial lung disease as before with slight improvement CTA 03/27/2020-groundglass opacitie obscured by motion artifact CTA 07/07/2021-interstitial and groundglass opacities with significant progression. Chest x-ray 11/12/2021-increased interstitial opacities CT high-resolution 02/22/2022-interstitial lung disease with basilar predominant fibrosis and probable UIP pattern, coronary atherosclerosis. CT high-resolution 03/11/2023-severe fibrotic interstitial lung disease with significant worsening compared to 02/23/2012 and probable UIP pattern I have reviewed the images personally.  PFTs: 09/02/2019-unable to complete  02/22/2022-unable to complete due to cough  Labs: Hypersensitivity panel 09/01/2018-negative ANA, rheumatoid factor 08/04/2018-negative  Cardiac: Echocardiogram 02/25/2023-LVEF 60 to 65%, grade 1 diastolic dysfunction, normal RV systolic size and function.  Normal PA systolic pressure.  Estimated RVSP 24  Assessment & Plan Interstitial lung disease Interstitial lung disease with a pattern suggestive of NSIP, possibly indicating an autoimmune process though autoimmune labs are negative. She does not have any signs and symptoms of connective tissue disease or exposures Review of CT scan shows ILD dating back to 2020 initially read as indeterminate but now in probable UIP pattern.  Discussed at multidisciplinary conference with diagnosis  of progressive fibrotic NSIP She could not complete PFTs in the past due to cough.  No evidence of pulmonary hypertension on echocardiogram  Currently on CellCept  and Ofev. CellCept  started one month ago at 500 mg twice daily. Ofev started in February, currently on 150 mg twice daily. Tolerating Ofev well with  only two episodes of diarrhea. Previous trial of pirfenidone  in 2024 was not well tolerated due to gastrointestinal side effects. Plan to increase CellCept  dose to manage lung inflammation and taper prednisone . Discussed potential future need for lung transplant, with insurance as a possible barrier.  - Increase CellCept  to 1000 mg twice daily with goal to eventually get her to max dose of 1500 mg twice daily - Reduce prednisone  to 20 mg/day as it is causing elevated blood sugar.  Continue Bactrim  - Order blood tests to monitor medication effects.  Check CBC, CMP  Oxygen therapy Requires 8 L/min of oxygen when walking and 4 L/min when sitting. Experiencing dryness likely due to oxygen use. Uses a humidifier to alleviate dryness. - Ensure use of humidifier with oxygen therapy  Pulmonary rehabilitation referral Referral for pulmonary rehabilitation to improve fitness, especially if considering lung transplant. Program involves supervised exercise twice a week for 8-12 weeks. - Refer to pulmonary rehabilitation program  Potential lung transplant Considering lung transplant as a future option. Insurance may be a barrier. Referral to [facility] for evaluation. Discussed the high cost and potential insurance issues with the orange card. - Refer to [facility] for lung transplant evaluation  Acid reflux and hiatal hernia Managed with Protonix , which is effectively controlling symptoms. No current issues reported.   Plan/Recommendations: Continue Ofev.  Increase CellCept  to 1000 mg twice daily Check labs for therapeutic monitoring Reduce prednisone  to 20 mg/day.  Continue Bactrim  Continue supplemental oxygen Referral to pulmonary rehab and lung transplant PPI for acid reflux  Phyllis Breeze MD [facility] 09/17/2023, 3:08 PM  CC: Aurora Lees, DO

## 2023-09-18 ENCOUNTER — Telehealth: Payer: Self-pay | Admitting: Pharmacist

## 2023-09-18 DIAGNOSIS — J849 Interstitial pulmonary disease, unspecified: Secondary | ICD-10-CM

## 2023-09-18 LAB — CBC WITH DIFFERENTIAL/PLATELET
Basophils Absolute: 0 10*3/uL (ref 0.0–0.1)
Basophils Relative: 0.3 % (ref 0.0–3.0)
Eosinophils Absolute: 0.1 10*3/uL (ref 0.0–0.7)
Eosinophils Relative: 0.6 % (ref 0.0–5.0)
HCT: 42 % (ref 36.0–46.0)
Hemoglobin: 14.2 g/dL (ref 12.0–15.0)
Lymphocytes Relative: 10.4 % — ABNORMAL LOW (ref 12.0–46.0)
Lymphs Abs: 1.4 10*3/uL (ref 0.7–4.0)
MCHC: 33.8 g/dL (ref 30.0–36.0)
MCV: 94 fl (ref 78.0–100.0)
Monocytes Absolute: 0.6 10*3/uL (ref 0.1–1.0)
Monocytes Relative: 4.3 % (ref 3.0–12.0)
Neutro Abs: 11.5 10*3/uL — ABNORMAL HIGH (ref 1.4–7.7)
Neutrophils Relative %: 84.4 % — ABNORMAL HIGH (ref 43.0–77.0)
Platelets: 300 10*3/uL (ref 150.0–400.0)
RBC: 4.47 Mil/uL (ref 3.87–5.11)
RDW: 13.3 % (ref 11.5–15.5)
WBC: 13.6 10*3/uL — ABNORMAL HIGH (ref 4.0–10.5)

## 2023-09-18 LAB — COMPREHENSIVE METABOLIC PANEL WITH GFR
ALT: 18 U/L (ref 0–35)
AST: 21 U/L (ref 0–37)
Albumin: 4.3 g/dL (ref 3.5–5.2)
Alkaline Phosphatase: 57 U/L (ref 39–117)
BUN: 10 mg/dL (ref 6–23)
CO2: 26 meq/L (ref 19–32)
Calcium: 9.6 mg/dL (ref 8.4–10.5)
Chloride: 101 meq/L (ref 96–112)
Creatinine, Ser: 0.49 mg/dL (ref 0.40–1.20)
GFR: 104.09 mL/min (ref >60.00)
Glucose, Bld: 142 mg/dL — ABNORMAL HIGH (ref 70–99)
Potassium: 3.9 meq/L (ref 3.5–5.1)
Sodium: 136 meq/L (ref 135–145)
Total Bilirubin: 0.5 mg/dL (ref 0.2–1.2)
Total Protein: 7.6 g/dL (ref 6.0–8.3)

## 2023-09-18 MED ORDER — OFEV 150 MG PO CAPS: 150.0000 mg | ORAL_CAPSULE | Freq: Two times a day (BID) | ORAL | 1 refills | Status: AC

## 2023-09-18 MED ORDER — MYCOPHENOLATE MOFETIL 500 MG PO TABS: 1000.0000 mg | ORAL_TABLET | Freq: Two times a day (BID) | ORAL | 1 refills | Status: DC

## 2023-09-18 NOTE — Telephone Encounter (Addendum)
 Rx for Cellcept  was faxed to [facility] on 09/09/23. Resent Cellcept  1000mg  twice daily to [facility].  Ofev rx escribed to [facility] today.  LFTs wnl on 08/01/23, CBC stable.  Geraldene Kleine, PharmD, MPH, BCPS, CPP Clinical Pharmacist (Rheumatology and Pulmonology)  ----- Message from Praveen Mannam sent at 09/17/2023  3:43 PM EDT ----- Can you please renew her Ofev.  I am increasing the CellCept  to 1000 mg twice daily.  She tells me that she gets both from specialty pharmacy so can you please help with these refills.  Thank you.

## 2023-10-29 ENCOUNTER — Other Ambulatory Visit: Payer: Self-pay | Admitting: Student

## 2023-10-29 DIAGNOSIS — J849 Interstitial pulmonary disease, unspecified: Secondary | ICD-10-CM

## 2023-10-30 ENCOUNTER — Other Ambulatory Visit (HOSPITAL_COMMUNITY): Payer: Self-pay

## 2023-10-30 ENCOUNTER — Other Ambulatory Visit: Payer: Self-pay

## 2023-10-30 MED ORDER — IPRATROPIUM-ALBUTEROL 0.5-2.5 (3) MG/3ML IN SOLN
3.0000 mL | Freq: Three times a day (TID) | RESPIRATORY_TRACT | 0 refills | Status: DC
  Filled 2023-10-30: qty 360, 40d supply, fill #0

## 2023-10-30 NOTE — Telephone Encounter (Signed)
 Medication sent to pharmacy

## 2023-10-31 ENCOUNTER — Other Ambulatory Visit (HOSPITAL_COMMUNITY): Payer: Self-pay

## 2023-11-04 ENCOUNTER — Ambulatory Visit: Payer: Self-pay | Admitting: Student

## 2023-11-04 ENCOUNTER — Other Ambulatory Visit: Payer: Self-pay

## 2023-11-04 ENCOUNTER — Encounter: Payer: Self-pay | Admitting: Student

## 2023-11-04 VITALS — BP 122/74 | HR 96 | Temp 98.1°F | Ht 60.0 in | Wt 139.1 lb

## 2023-11-04 DIAGNOSIS — R7303 Prediabetes: Secondary | ICD-10-CM

## 2023-11-04 DIAGNOSIS — Z0000 Encounter for general adult medical examination without abnormal findings: Secondary | ICD-10-CM

## 2023-11-04 DIAGNOSIS — J849 Interstitial pulmonary disease, unspecified: Secondary | ICD-10-CM

## 2023-11-04 NOTE — Patient Instructions (Addendum)
 Thank you so much for coming to the clinic today!   We will be rechecking your A1c today, I will send you the results when it comes back. I will also touch base with Eric.  If you have any questions please feel free to the call the clinic at anytime at [phone]. It was a pleasure seeing you!  Best, Dr. Nelia Peak gracias por venir hoy a la clnica!  Le volveremos a medir la A1c hoy. Le enviar los resultados cuando estn disponibles. Tambin me pondr en contacto con Eric.  Si tiene [employer], no dude en llamar a la clnica en cualquier momento al [phone]. Fue Psychiatrist verlo!  Saludos cordiales, Dr. Lissy Deuser

## 2023-11-05 NOTE — Assessment & Plan Note (Signed)
 Following with pulmonology, last visit on 5/14. Currently being treated with prednisone  40mg  daily, Bactrim , Cellcept , and now nintedanib as well. Being evaluated for lung transplant however she will need insurance coverage. She is currently working with social services to apply for medicaid, and has submitted all proper documentation. She is connected with financial assistance program through [facility], and she is waiting on approval.

## 2023-11-05 NOTE — Assessment & Plan Note (Addendum)
 A1c in February of 2025 was 6.1%, will recheck today. Slow increase in A1c may be secondary to steroid use for her ILD.   Plan:  - Recheck A1c  Unfortunately pt left clinic before being able to get A1c, so will reach out to her and schedule lab only visit for A1c.

## 2023-11-05 NOTE — Progress Notes (Signed)
 CC: General well-being check  HPI:  [name] is a 58 y.o. female living with a history stated below and presents today for wellness check. Please see problem based assessment and plan for additional details.  Past Medical History:  Diagnosis Date   Acute respiratory distress 03/27/2020   Elevated blood pressure reading 12/21/2018   GERD (gastroesophageal reflux disease)    Healthcare maintenance 06/28/2019   Per patient's recent discharge summary, FOBT positive in ED. Given her overall prognosis and health condition, will hold off discussing colonoscopy.   History of COVID-19 09/02/2019   05/03/19 - [facility] - positive  Did not get mab  Did not get hosp   Preventative health care 01/15/2013   Pulmonary fibrosis (HCC)    Screening breast examination 11/26/2018    Current Outpatient Medications on File Prior to Visit  Medication Sig Dispense Refill   albuterol  (PROVENTIL ) (2.5 MG/3ML) 0.083% nebulizer solution Inhale 3 mLs into the lungs by nebulizer every 4 (four) hours as needed for wheezing or shortness of breath. 75 mL 12   benzonatate  (TESSALON ) 100 MG capsule Take 1 capsule (100 mg total) by mouth 3 (three) times daily. (Patient not taking: Reported on 09/17/2023) 20 capsule 0   guaiFENesin -dextromethorphan  (ROBITUSSIN DM) 100-10 MG/5ML syrup Take 5 mLs by mouth every 4 (four) hours as needed for cough. 118 mL 0   ipratropium-albuterol  (DUONEB) 0.5-2.5 (3) MG/3ML SOLN Take 3 mLs by nebulization 3 (three) times daily. 360 mL 0   mycophenolate  (CELLCEPT ) 500 MG tablet Take 2 tablets (1,000 mg total) by mouth 2 (two) times daily. 360 tablet 1   Nintedanib (OFEV ) 150 MG CAPS Take 1 capsule (150 mg total) by mouth 2 (two) times daily. 180 capsule 1   pantoprazole  (PROTONIX ) 40 MG tablet Take 1 tablet (40 mg total) by mouth daily. 60 tablet 0   potassium chloride  SA (KLOR-CON  M) 20 MEQ tablet Take 1 tablet (20 mEq total) by mouth daily. 30 tablet 1   predniSONE  (DELTASONE )  20 MG tablet Take 1 tablet (20 mg total) by mouth daily with breakfast. 90 tablet 3   rosuvastatin  (CRESTOR ) 20 MG tablet Take 1 tablet (20 mg total) by mouth daily. 30 tablet 11   sodium chloride  (OCEAN) 0.65 % SOLN nasal spray Place 1 spray into both nostrils 3 (three) times daily. 88 mL 0   sulfamethoxazole -trimethoprim  (BACTRIM ) 400-80 MG tablet Take 1 tablet by mouth 3 (three) times a week. 36 tablet 9   No current facility-administered medications on file prior to visit.    Family History  Problem Relation Age of Onset   Diabetes Father    Hypertension Father    Asthma Sister    Diabetes Paternal Grandfather    Breast cancer Neg Hx     Social History   Socioeconomic History   Marital status: Married    Spouse name: Not on file   Number of children: 3   Years of education: Not on file   Highest education level: 9th grade  Occupational History   Not on file  Tobacco Use   Smoking status: Never    Passive exposure: Never   Smokeless tobacco: Never  Vaping Use   Vaping status: Never Used  Substance and Sexual Activity   Alcohol use: Not Currently   Drug use: No   Sexual activity: Not Currently    Birth control/protection: Post-menopausal  Other Topics Concern   Not on file  Social History Narrative   Native of [place], [place]  Husband, Luis, alcoholic   3 children, Oldest son in [place]   Grandchildren, 3         Social Drivers of Health   Financial Resource Strain: High Risk (07/08/2021)   Overall Financial Resource Strain (CARDIA)    Difficulty of Paying Living Expenses: Very hard  Food Insecurity: No Food Insecurity (03/06/2023)   Hunger Vital Sign    Worried About Running Out of Food in the Last Year: Never true    Ran Out of Food in the Last Year: Never true  Transportation Needs: No Transportation Needs (03/06/2023)   PRAPARE - Administrator, Civil Service (Medical): No    Lack of Transportation (Non-Medical): No  Physical Activity:  Not on file  Stress: Not on file  Social Connections: Not on file  Intimate Partner Violence: Not At Risk (03/06/2023)   Humiliation, Afraid, Rape, and Kick questionnaire    Fear of Current or Ex-Partner: No    Emotionally Abused: No    Physically Abused: No    Sexually Abused: No    Review of Systems: ROS negative except for what is noted on the assessment and plan.  Vitals:   11/04/23 1508  BP: 122/74  Pulse: 96  Temp: 98.1 F (36.7 C)  TempSrc: Oral  SpO2: 95%  Weight: 139 lb 1.6 oz (63.1 kg)  Height: 5' (1.524 m)    Physical Exam: Constitutional: Stable,  in no acute distress HENT: normocephalic atraumatic, mucous membranes moist Eyes: conjunctiva non-erythematous Neck: supple Cardiovascular: regular rate and rhythm, no m/r/g Pulmonary/Chest: On her home 7L of oxygen, able to speak in full sentences   Assessment & Plan:   Prediabetes A1c in February of 2025 was 6.1%, will recheck today. Slow increase in A1c may be secondary to steroid use for her ILD.   Plan:  - Recheck A1c  Unfortunately pt left clinic before being able to get A1c, so will reach out to her and schedule lab only visit for A1c.  ILD (interstitial lung disease) [facility]) Following with pulmonology, last visit on 5/14. Currently being treated with prednisone  40mg  daily, Bactrim , Cellcept , and now nintedanib as well. Being evaluated for lung transplant however she will need insurance coverage. She is currently working with social services to apply for medicaid, and has submitted all proper documentation. She is connected with financial assistance program through [facility], and she is waiting on approval.   Healthcare maintenance Holding off on colon cancer screening, mammogram until insurance approved.   Patient discussed with Dr. Machen  Qaadir Kent, M.D. [facility] Internal Medicine, PGY-3 Pager: [phone] Date 11/05/2023 Time 7:36 AM

## 2023-11-05 NOTE — Progress Notes (Signed)
 Internal Medicine Clinic Attending  Case discussed with the resident at the time of the visit.  We reviewed the resident's history and exam and pertinent patient test results.  I agree with the assessment, diagnosis, and plan of care documented in the resident's note.    Pulmonology mentioned lung transplant for her ILD. I think a lot of her health maintenance will depend on if she will be eligible for a lung transplant or not, so the most pressing issue is to get Olivia [name] financial assistance. Please follow up on this at every visit.

## 2023-11-05 NOTE — Assessment & Plan Note (Signed)
 Holding off on colon cancer screening, mammogram until insurance approved.

## 2023-11-26 ENCOUNTER — Other Ambulatory Visit (HOSPITAL_COMMUNITY): Payer: Self-pay

## 2023-11-26 ENCOUNTER — Ambulatory Visit: Admitting: Primary Care

## 2023-11-26 ENCOUNTER — Encounter: Payer: Self-pay | Admitting: Primary Care

## 2023-11-26 VITALS — BP 136/76 | HR 107 | Temp 98.5°F | Ht <= 58 in | Wt 138.4 lb

## 2023-11-26 DIAGNOSIS — J9611 Chronic respiratory failure with hypoxia: Secondary | ICD-10-CM

## 2023-11-26 DIAGNOSIS — Z5181 Encounter for therapeutic drug level monitoring: Secondary | ICD-10-CM

## 2023-11-26 DIAGNOSIS — J849 Interstitial pulmonary disease, unspecified: Secondary | ICD-10-CM

## 2023-11-26 MED ORDER — MYCOPHENOLATE MOFETIL 500 MG PO TABS
1000.0000 mg | ORAL_TABLET | Freq: Two times a day (BID) | ORAL | 1 refills | Status: DC
  Filled 2023-11-26: qty 360, 90d supply, fill #0

## 2023-11-26 NOTE — Progress Notes (Signed)
 @Patient  ID: Olivia [name], female    DOB: 1965/08/24, 58 y.o.   MRN: [id number]  No chief complaint on file.   Referring provider: Kandis Perkins, DO  HPI: 58 y.o. who  has a past medical history of Acute respiratory distress (03/27/2020), Elevated blood pressure reading (12/21/2018), GERD (gastroesophageal reflux disease), Healthcare maintenance (06/28/2019), History of COVID-19 (09/02/2019), Preventative health care (01/15/2013), Pulmonary fibrosis (HCC), and Screening breast examination (11/26/2018).   Cough started in 2019 after she had a fall.  Was seen in 2020 with a CT scan showing interstitial lung disease and was referred to pulmonary clinic.  She has been placed on prednisone  since then with minimal improvement in symptoms.  Recent chest x-ray shows worsening ILD and has been referred to ILD clinic for further evaluation  Complains of chronic cough, chest congestion and dyspnea for the past 3 years.  Denies any joint pain or rash.  She has occasional difficulty swallowing.  Has dry mouth, dry eyes.  Started pirfenidone  on end of Jan 2024.  But she stopped it after few months due to severe GI side effects, weight loss She was hospitalized in October 2024 for for respiratory failure and ILD exacerbation. She was discharged on high dose prednisone  and Bactrim .  Postdischarge she continues to make slow improvements.  Remains on supplemental oxygen, prednisone  at 40 mg/day and Bactrim   Her case was presented at ILD conference in Nov 2024 with diagnosis with progressive fibrotic NSIP. Recommendations were to restart antifibrotics and start immunosuppressant medication such as CellCept .   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  Currently stays at home Exposures: No mold, hot tub, Jacuzzi.  No feather pillows or comforters ILD questionnaire 12/27/2021-negative Smoking history: Never smoker Travel history: Immigrated  from [place] in 2000.  No significant recent travel Relevant family history: Sister has asthma.  Interim history: Discussed the use of AI scribe software for clinical note transcription with the patient, who gave verbal consent to proceed.  History of Present Illness Olivia [name] is a 58 year old female with interstitial lung disease who presents for follow-up of her condition.  She has been diagnosed with non-specific interstitial pneumonia (NSIP) and started treatment with CellCept  and Ofev . CellCept  was initiated about a month ago at a dose of 500 mg twice daily, and Ofev  was started three months ago at a dose of 150 mg twice daily. She tolerates Ofev  well, with only two episodes of diarrhea. Previously, she tried pirfenidone  in 2024, which caused significant stomach side effects. She is currently on prednisone , reduced to 30 mg daily after starting CellCept . She also takes Bactrim  three times a week as a prophylactic antibiotic to prevent Pneumocystis pneumonia, but has experienced issues with pharmacy refills due to authorization requirements.  She has a history of acid reflux and a hiatal hernia, for which she takes Protonix . Her acid reflux is under control and does not cause significant problems. She uses oxygen therapy, requiring 8 liters per minute when walking and 4 liters per minute when sitting, and uses a humidifier to prevent dryness.  Before her lung condition worsened, she was very active, cleaning houses and exercising regularly, but her activity level has significantly declined since her symptoms began. She experiences heart palpitations at times. Her social history includes reliance on her husband for transportation to the pharmacy, as he is the only one working. She has insurance through an 'orange card'.  She feels tired  and agitated after active days.    Assessment & Plan Interstitial lung disease Interstitial lung disease with a pattern suggestive of NSIP,  possibly indicating an autoimmune process though autoimmune labs are negative. She does not have any signs and symptoms of connective tissue disease or exposures Review of CT scan shows ILD dating back to 2020 initially read as indeterminate but now in probable UIP pattern.  Discussed at multidisciplinary conference with diagnosis of progressive fibrotic NSIP She could not complete PFTs in the past due to cough.  No evidence of pulmonary hypertension on echocardiogram  Currently on CellCept  and Ofev . CellCept  started one month ago at 500 mg twice daily. Ofev  started in February, currently on 150 mg twice daily. Tolerating Ofev  well with only two episodes of diarrhea. Previous trial of pirfenidone  in 2024 was not well tolerated due to gastrointestinal side effects. Plan to increase CellCept  dose to manage lung inflammation and taper prednisone . Discussed potential future need for lung transplant, with insurance as a possible barrier.  - Increase CellCept  to 1000 mg twice daily with goal to eventually get her to max dose of 1500 mg twice daily - Reduce prednisone  to 20 mg/day as it is causing elevated blood sugar.  Continue Bactrim  - Order blood tests to monitor medication effects.  Check CBC, CMP  Oxygen therapy Requires 8 L/min of oxygen when walking and 4 L/min when sitting. Experiencing dryness likely due to oxygen use. Uses a humidifier to alleviate dryness. - Ensure use of humidifier with oxygen therapy  Pulmonary rehabilitation referral Referral for pulmonary rehabilitation to improve fitness, especially if considering lung transplant. Program involves supervised exercise twice a week for 8-12 weeks. - Refer to pulmonary rehabilitation program  Potential lung transplant Considering lung transplant as a future option. Insurance may be a barrier. Referral to [facility] for evaluation. Discussed the high cost and potential insurance issues with the orange card. - Refer to [facility] for lung transplant  evaluation  Acid reflux and hiatal hernia Managed with Protonix , which is effectively controlling symptoms. No current issues reported.   Plan/Recommendations: Continue Ofev .  Increase CellCept  to 1000 mg twice daily Check labs for therapeutic monitoring Reduce prednisone  to 20 mg/day.  Continue Bactrim  Continue supplemental oxygen Referral to pulmonary rehab and lung transplant PPI for acid reflux   11/26/2023- Interim hx  Discussed the use of AI scribe software for clinical note transcription with the patient, who gave verbal consent to proceed.  History of Present Illness   AMBAR RAPHAEL is a 58 year old female with pulmonary fibrosis who presents for an eight-week follow-up visit.  She is currently on Ofev  150 mg twice daily and CellCept , with a recent dosage adjustment to 1000 mg twice daily, although she has been taking only 500 mg twice daily due to a misunderstanding. She also takes prednisone  20 mg once daily. She experiences diarrhea, which she attributes to Ofev , and manages it with over-the-counter Imodium as needed.  She has an upcoming appointment at [facility] for evaluation as a potential lung transplant candidate. She requires 8 liters of oxygen when walking and 4 liters when sitting due to chronic respiratory failure, experiencing significant drops in oxygen levels with ambulation, necessitating pauses and rest.  She has not been contacted for pulmonary rehabilitation but tries to stay active by using a small exercise machine and walking as much as possible. She inquires about taking a supplement called Noni, which she feels has helped with gastrointestinal discomfort.  Her current medications include Bactrim , taken  one tablet by mouth three times a week. She is also due for lab work today and monthly through November, as well as a high-resolution CT scan of her lungs.  No changes in her breathing since her last visit, but her oxygen levels drop significantly  when she walks.      Allergies  Allergen Reactions   Miralax  [Polyethylene Glycol] Rash    Immunization History  Administered Date(s) Administered   Influenza, Seasonal, Injecte, Preservative Fre 02/20/2023   Influenza,inj,Quad PF,6+ Mos 03/20/2018, 06/28/2019, 02/22/2020, 02/12/2021   PFIZER(Purple Top)SARS-COV-2 Vaccination 09/24/2019, 10/15/2019   PNEUMOCOCCAL CONJUGATE-20 02/20/2023   Td 05/06/1996   Tdap 01/15/2013    Past Medical History:  Diagnosis Date   Acute respiratory distress 03/27/2020   Elevated blood pressure reading 12/21/2018   GERD (gastroesophageal reflux disease)    Healthcare maintenance 06/28/2019   Per patient's recent discharge summary, FOBT positive in ED. Given her overall prognosis and health condition, will hold off discussing colonoscopy.   History of COVID-19 09/02/2019   05/03/19 - [place]2 - positive  Did not get mab  Did not get hosp   Preventative health care 01/15/2013   Pulmonary fibrosis (HCC)    Screening breast examination 11/26/2018    Tobacco History: Social History   Tobacco Use  Smoking Status Never   Passive exposure: Never  Smokeless Tobacco Never   Counseling given: Not Answered   Outpatient Medications Prior to Visit  Medication Sig Dispense Refill   albuterol  (PROVENTIL ) (2.5 MG/3ML) 0.083% nebulizer solution Inhale 3 mLs into the lungs by nebulizer every 4 (four) hours as needed for wheezing or shortness of breath. 75 mL 12   benzonatate  (TESSALON ) 100 MG capsule Take 1 capsule (100 mg total) by mouth 3 (three) times daily. (Patient not taking: Reported on 09/17/2023) 20 capsule 0   guaiFENesin -dextromethorphan  (ROBITUSSIN DM) 100-10 MG/5ML syrup Take 5 mLs by mouth every 4 (four) hours as needed for cough. 118 mL 0   ipratropium-albuterol  (DUONEB) 0.5-2.5 (3) MG/3ML SOLN Take 3 mLs by nebulization 3 (three) times daily. 360 mL 0   mycophenolate  (CELLCEPT ) 500 MG tablet Take 2 tablets (1,000 mg total) by mouth 2 (two) times  daily. 360 tablet 1   Nintedanib (OFEV ) 150 MG CAPS Take 1 capsule (150 mg total) by mouth 2 (two) times daily. 180 capsule 1   pantoprazole  (PROTONIX ) 40 MG tablet Take 1 tablet (40 mg total) by mouth daily. 60 tablet 0   potassium chloride  SA (KLOR-CON  M) 20 MEQ tablet Take 1 tablet (20 mEq total) by mouth daily. 30 tablet 1   predniSONE  (DELTASONE ) 20 MG tablet Take 1 tablet (20 mg total) by mouth daily with breakfast. 90 tablet 3   rosuvastatin  (CRESTOR ) 20 MG tablet Take 1 tablet (20 mg total) by mouth daily. 30 tablet 11   sodium chloride  (OCEAN) 0.65 % SOLN nasal spray Place 1 spray into both nostrils 3 (three) times daily. 88 mL 0   sulfamethoxazole -trimethoprim  (BACTRIM ) 400-80 MG tablet Take 1 tablet by mouth 3 (three) times a week. 36 tablet 9   No facility-administered medications prior to visit.   Review of Systems  Review of Systems  Constitutional: Negative.   Respiratory:  Positive for shortness of breath. Negative for cough and wheezing.        Dyspnea with exertion   Cardiovascular: Negative.    Physical Exam  LMP  (LMP Unknown)  Physical Exam Constitutional:      General: She is not in acute distress.  Appearance: Normal appearance. She is not ill-appearing.  HENT:     Head: Normocephalic and atraumatic.  Cardiovascular:     Rate and Rhythm: Normal rate and regular rhythm.  Pulmonary:     Effort: Pulmonary effort is normal.     Breath sounds: Rales present. No wheezing.     Comments: 4L at rest  Musculoskeletal:        General: Normal range of motion.  Skin:    General: Skin is warm and dry.  Neurological:     General: No focal deficit present.     Mental Status: She is alert and oriented to person, place, and time. Mental status is at baseline.  Psychiatric:        Mood and Affect: Mood normal.        Behavior: Behavior normal.        Thought Content: Thought content normal.        Judgment: Judgment normal.      Lab Results:  CBC     Component Value Date/Time   WBC 13.6 (H) 09/17/2023 1612   RBC 4.47 09/17/2023 1612   HGB 14.2 09/17/2023 1612   HGB 13.8 12/26/2016 1643   HCT 42.0 09/17/2023 1612   HCT 41.1 12/26/2016 1643   PLT 300.0 09/17/2023 1612   PLT 268 12/26/2016 1643   MCV 94.0 09/17/2023 1612   MCV 89 12/26/2016 1643   MCH 31.0 03/10/2023 0433   MCHC 33.8 09/17/2023 1612   RDW 13.3 09/17/2023 1612   RDW 13.0 12/26/2016 1643   LYMPHSABS 1.4 09/17/2023 1612   MONOABS 0.6 09/17/2023 1612   EOSABS 0.1 09/17/2023 1612   BASOSABS 0.0 09/17/2023 1612    BMET    Component Value Date/Time   NA 136 09/17/2023 1612   NA 140 03/13/2023 1405   K 3.9 09/17/2023 1612   CL 101 09/17/2023 1612   CO2 26 09/17/2023 1612   GLUCOSE 142 (H) 09/17/2023 1612   BUN 10 09/17/2023 1612   BUN 7 03/13/2023 1405   CREATININE 0.49 09/17/2023 1612   CREATININE 0.52 05/23/2014 0855   CALCIUM  9.6 09/17/2023 1612   GFRNONAA >60 03/10/2023 0433   GFRAA >60 08/22/2017 0546    BNP    Component Value Date/Time   BNP 38.6 03/05/2023 1016    ProBNP    Component Value Date/Time   PROBNP 84 10/10/2022 1514    Imaging: No results found.   Assessment & Plan:   1. ILD (interstitial lung disease) (HCC) (Primary) - AMB referral to pulmonary rehabilitation - CBC; Future - Comp Met (CMET); Future - CT CHEST HIGH RESOLUTION; Future  2. Chronic respiratory failure with hypoxia (HCC) - AMB referral to pulmonary rehabilitation  3. Therapeutic drug monitoring - CBC; Future - Comp Met (CMET); Future  Assessment and Plan    Pulmonary fibrosis Chronic condition characterized by scarring of the lungs, managed with antifibrotic therapy (Ofev ) and immunosuppressant (CellCept ). - Adjust CellCept  to 1000 mg twice a day (two 500 mg tablets morning and evening). - Order high-resolution CT scan of the lungs. - Refer to pulmonary rehabilitation. - Continue Ofev  150 mg twice a day. - Monitor for potential lung transplant  candidacy at [facility] on November 6th.  Chronic respiratory failure Ongoing condition requiring supplemental oxygen, using 8 liters when walking and 4 liters when sitting.  Diarrhea due to Ofev  Intermittent diarrhea associated with Ofev  use, varying in frequency and severity. - Recommend Imodium for diarrhea, 2 mg four times a day as  needed for loose stools.  Risk of pneumonia due to prednisone  Increased risk of pneumonia due to immunosuppression from prednisone  therapy. - Continue Bactrim  one tablet by mouth three times a week to prevent pneumonia.  Recording duration: 17 minutes     Almarie LELON Ferrari, NP 11/26/2023

## 2023-11-26 NOTE — Patient Instructions (Addendum)
   YOUR PLAN: -PULMONARY FIBROSIS: Pulmonary fibrosis is a chronic condition where the lungs become scarred over time. We have adjusted your CellCept  dosage to 1000 mg twice a day (two 500 mg tablets in the morning and evening). You will continue taking Ofev  150 mg twice a day. A high-resolution CT scan of your lungs has been ordered, and you have been referred to pulmonary rehabilitation. We will also monitor your potential candidacy for a lung transplant at [facility] on November 6th.  -CHRONIC RESPIRATORY FAILURE: Chronic respiratory failure is a long-term condition where your lungs cannot provide enough oxygen to your body. You should continue using 8 liters of oxygen when walking and 4 liters when sitting.  -DIARRHEA DUE TO OFEV : You are experiencing diarrhea as a side effect of Ofev . You can manage this by taking over the counter Imodium, 2 mg, up to four times a day as needed for loose stools.  -RISK OF PNEUMONIA DUE TO PREDNISONE : Taking prednisone  increases your risk of pneumonia because it suppresses your immune system. To prevent pneumonia while on chronic prednisone , continue taking Bactrim  one tablet by mouth three times a week.  INSTRUCTIONS: Please follow up with your lab work today and continue monthly through November. Attend your high-resolution CT scan appointment as scheduled. We will see you again for your next follow-up visit after your evaluation at [facility] on November 6th.  Follow-up 8 weeks with Dr. Theophilus   ___________________________________________________________________________________   SU PLAN: - FIBROSIS PULMONAR: La fibrosis pulmonar es una enfermedad crnica en la que los pulmones se cicatrizan con el Hope. Hemos ajustado su dosis de CellCept  a 1000 mg dos veces al da (dos comprimidos de 500 mg por la maana y por la noche). Continuar tomando Ofev  150 mg [employer]. Se le ha realizado una tomografa computarizada de alta resolucin de sus pulmones  y se le ha derivado a rehabilitacin pulmonar. Tambin evaluaremos su posible candidatura para un trasplante de pulmn en [facility] el 6 de noviembre.  - INSUFICIENCIA RESPIRATORIA CRNICA: La insuficiencia respiratoria crnica es una enfermedad a largo plazo en la que los pulmones no pueden proporcionar suficiente oxgeno al cuerpo. Debe continuar consumiendo 8 litros de oxgeno al caminar y 4 litros al sentarse.  - DIARREA DEBIDA A OFEV : Presenta diarrea como efecto secundario de Ofev . Puede controlarla tomando Imodium de venta libre, 2 mg, hasta cuatro veces al da, segn sea necesario, para las heces blandas.  RIESGO DE NEUMONA DEBIDO A LA PREDNISONA: Tomar prednisona aumenta el riesgo de neumona porque suprime el sistema inmunitario. Para prevenir la neumona mientras toma prednisona crnica, contine tomando una tableta de Bactrim  por va oral tres veces por semana.  INSTRUCCIONES: Por favor, realice sus anlisis de laboratorio hoy mismo y contine mensualmente [place]. Asista a su cita para la tomografa computarizada de alta resolucin segn lo programado. Nos veremos nuevamente para su prxima visita de seguimiento despus de su evaluacin en [facility] el 6 de noviembre.  Seguimiento 8 semanas con el Dr. Theophilus

## 2023-11-27 ENCOUNTER — Other Ambulatory Visit (HOSPITAL_COMMUNITY): Payer: Self-pay

## 2023-12-01 ENCOUNTER — Ambulatory Visit
Admission: RE | Admit: 2023-12-01 | Discharge: 2023-12-01 | Disposition: A | Discharge status: Home / Self Care | Source: Ambulatory Visit | Attending: Primary Care

## 2023-12-01 DIAGNOSIS — J849 Interstitial pulmonary disease, unspecified: Secondary | ICD-10-CM

## 2023-12-02 ENCOUNTER — Ambulatory Visit: Payer: Self-pay | Admitting: Primary Care

## 2023-12-03 ENCOUNTER — Other Ambulatory Visit (INDEPENDENT_AMBULATORY_CARE_PROVIDER_SITE_OTHER): Payer: Self-pay

## 2023-12-03 DIAGNOSIS — J849 Interstitial pulmonary disease, unspecified: Secondary | ICD-10-CM

## 2023-12-03 DIAGNOSIS — Z5181 Encounter for therapeutic drug level monitoring: Secondary | ICD-10-CM

## 2023-12-03 LAB — CBC
HCT: 41.1 % (ref 36.0–46.0)
Hemoglobin: 13.8 g/dL (ref 12.0–15.0)
MCHC: 33.5 g/dL (ref 30.0–36.0)
MCV: 95.8 fl (ref 78.0–100.0)
Platelets: 293 K/uL (ref 150.0–400.0)
RBC: 4.29 Mil/uL (ref 3.87–5.11)
RDW: 12.7 % (ref 11.5–15.5)
WBC: 11.6 K/uL — ABNORMAL HIGH (ref 4.0–10.5)

## 2023-12-03 LAB — COMPREHENSIVE METABOLIC PANEL WITH GFR
ALT: 28 U/L (ref 0–35)
AST: 25 U/L (ref 0–37)
Albumin: 4.2 g/dL (ref 3.5–5.2)
Alkaline Phosphatase: 53 U/L (ref 39–117)
BUN: 12 mg/dL (ref 6–23)
CO2: 32 meq/L (ref 19–32)
Calcium: 9.4 mg/dL (ref 8.4–10.5)
Chloride: 101 meq/L (ref 96–112)
Creatinine, Ser: 0.52 mg/dL (ref 0.40–1.20)
GFR: 102.46 mL/min (ref >60.00)
Glucose, Bld: 89 mg/dL (ref 70–99)
Potassium: 3.3 meq/L — ABNORMAL LOW (ref 3.5–5.1)
Sodium: 140 meq/L (ref 135–145)
Total Bilirubin: 0.8 mg/dL (ref 0.2–1.2)
Total Protein: 7.1 g/dL (ref 6.0–8.3)

## 2023-12-04 ENCOUNTER — Telehealth (HOSPITAL_COMMUNITY): Payer: Self-pay

## 2023-12-04 NOTE — Telephone Encounter (Signed)
 Pt is uninsured and is unable to participate in the pulmonary rehab program. Pt will call if anything changes.   Closed referral.

## 2023-12-04 NOTE — Progress Notes (Signed)
 Pt and interpretor [phone] called me back. Results/ Beth's note was relayed. Pt verbalized understanding. NFN

## 2023-12-04 NOTE — Progress Notes (Signed)
 I called the Interpretor line and spoke with interpretor [phone] regarding results. The interpretor was unable to reach pt and left her a vm asking for her to call our office.

## 2023-12-08 ENCOUNTER — Other Ambulatory Visit (HOSPITAL_COMMUNITY): Payer: Self-pay

## 2023-12-08 ENCOUNTER — Telehealth: Payer: Self-pay

## 2023-12-08 NOTE — Telephone Encounter (Signed)
 Copied from CRM [id number]. Topic: General - Call Back - No Documentation >> Dec 04, 2023  8:24 AM Leila C wrote: Reason for CRM: Patient [phone] is returning the office call, no message was left and patient is unsure of the call. Per CAL, CMA is unavailable. Please call back.  Per chart note:  Hope Almarie ORN, NP to [facility]     12/03/23  5:07 PM Result Note Potassium is a-little low, please make sure she is taking KCL supplement  Comp Met (CMET); CBC Hope Almarie ORN, NP to Inocente Rise T, CMA  [facility]    12/02/23  1:29 PM Result Note CT chest on 7/28 showed very slight progression pulmonary fibrosis, we increased her cellcept  dose at her most recent visit. Continue OFEV . FU in 8 weeks with Dr. Theophilus (Patient is spanish speaking, needs interpretor)  CT CHEST HIGH RESOLUTION  Called the interpreter line.  Spoke with interpreter # [phone].  Patient not available.  Left VM in spanish for patient to call back to get CT and lab results.

## 2023-12-08 NOTE — Telephone Encounter (Signed)
 Patient is returning call she received concerning her results . Going to call cal now . Clinic access line was not available .SABRA Please give patient a call back concerning those results. Almeda got me transferred to [facility] and transferring patient to [facility] for results

## 2023-12-08 NOTE — Telephone Encounter (Signed)
 Spoke with patient with interpreter ID # [id number].  Provided results/recommendations per Almarie Ferrari NP.  Patient verbalized understanding.  Nothing further needed.

## 2023-12-22 ENCOUNTER — Other Ambulatory Visit: Payer: Self-pay

## 2023-12-22 DIAGNOSIS — Z5181 Encounter for therapeutic drug level monitoring: Secondary | ICD-10-CM

## 2023-12-22 NOTE — Progress Notes (Signed)
 Lab orders placed for pt for CBC and CMET for medication monitoring.

## 2023-12-23 ENCOUNTER — Other Ambulatory Visit: Payer: Self-pay | Admitting: Student

## 2023-12-23 ENCOUNTER — Other Ambulatory Visit: Payer: Self-pay | Admitting: Adult Health

## 2023-12-23 ENCOUNTER — Encounter: Payer: Self-pay | Admitting: Pulmonary Disease

## 2023-12-23 DIAGNOSIS — J849 Interstitial pulmonary disease, unspecified: Secondary | ICD-10-CM

## 2023-12-24 ENCOUNTER — Other Ambulatory Visit (HOSPITAL_COMMUNITY): Payer: Self-pay

## 2023-12-24 ENCOUNTER — Other Ambulatory Visit: Payer: Self-pay

## 2023-12-24 MED ORDER — IPRATROPIUM-ALBUTEROL 0.5-2.5 (3) MG/3ML IN SOLN
3.0000 mL | Freq: Three times a day (TID) | RESPIRATORY_TRACT | 0 refills | Status: DC
  Filled 2023-12-24: qty 360, 40d supply, fill #0

## 2023-12-25 ENCOUNTER — Other Ambulatory Visit (HOSPITAL_COMMUNITY): Payer: Self-pay

## 2023-12-25 MED ORDER — POTASSIUM CHLORIDE CRYS ER 20 MEQ PO TBCR
20.0000 meq | EXTENDED_RELEASE_TABLET | Freq: Every day | ORAL | 1 refills | Status: DC
  Filled 2023-12-25: qty 30, 30d supply, fill #0
  Filled 2024-03-02: qty 30, 30d supply, fill #1

## 2023-12-29 ENCOUNTER — Telehealth: Payer: Self-pay | Admitting: *Deleted

## 2023-12-29 ENCOUNTER — Other Ambulatory Visit (HOSPITAL_COMMUNITY): Payer: Self-pay

## 2023-12-29 DIAGNOSIS — J849 Interstitial pulmonary disease, unspecified: Secondary | ICD-10-CM

## 2023-12-29 DIAGNOSIS — J8489 Other specified interstitial pulmonary diseases: Secondary | ICD-10-CM

## 2023-12-29 MED ORDER — MYCOPHENOLATE MOFETIL 500 MG PO TABS: 1000.0000 mg | ORAL_TABLET | Freq: Two times a day (BID) | ORAL | 1 refills | Status: DC

## 2023-12-29 NOTE — Telephone Encounter (Signed)
 Patient is uninsured. She filled Cellcept  through the patient assistance program's pharmacy which is [facility]  Called patient using interpreter [phone]. Patient provided with phone number for [facility]. Advised to f/u tomorrow morning to schedule shipment.  [facility] Phone :  [phone]   Sherry Pennant, PharmD, MPH, BCPS, CPP Clinical Pharmacist (Rheumatology and Pulmonology)

## 2023-12-29 NOTE — Telephone Encounter (Signed)
 Copied from CRM [id number]. Topic: Clinical - Prescription Issue >> Dec 26, 2023 10:44 AM Rozanna MATSU wrote: Reason for CRM: PT IS CALLING ABOUT THE mycophenolate  (CELLCEPT ) 500 MG tablet MEDICATION STATED SHE REC'D MED BUT THE DOSAGE IS NOT CORRECT WOULD LIKE A CALL BACK  Called pt via pac[employer] interpreters- pt is stating that the pharmacy did not send enough Cellcept  and she is going to run out. I advised that we sent a 90 day supply on 11/26/23 with 1 refill to [facility]. She states this is incorrect. I called the pharmacy and spoke with Megan, who advised that they did receive our rx, and the prescription was ready to be picked up and pt never picked it up. They held this for a week and put it back. The only other rx on file for this was back in 2024, and she also never picked this up either.   At this point I am not sure what to do as far as sending this in, since it does not seem like she has been taking the medication at all and I fear sending in the wrong dose for her.   Devki, can you please help with this one?

## 2023-12-30 MED ORDER — MYCOPHENOLATE MOFETIL 500 MG PO TABS: 1000.0000 mg | ORAL_TABLET | Freq: Two times a day (BID) | ORAL | 1 refills | Status: DC

## 2023-12-30 NOTE — Telephone Encounter (Signed)
 Spoke with [facility] who transferred me to [place] who transferred me to another department. Spoke with Camellia and provided verbal that Cellcept  dose is 1000mg  twice daily  Per rep, Dietitian to [employer] is different than [employer]. The Cellcept  pharmacy is now [facility]. They will process rx and take 1-2 business days to process before contacting patient  Called patient using interpreter # [phone], Olivia [name]. Clarified that a prescription for the correct dose and to the correct pharmacy has been sent.  She verbalized understanding and is aware that shipment will be scheduled in the next few days. NFN  Phone: [phone]  Sherry Pennant, PharmD, MPH, BCPS, CPP Clinical Pharmacist (Rheumatology and Pulmonology)

## 2023-12-30 NOTE — Addendum Note (Signed)
 Addended by: DAYNE SHERRY RAMAN on: 12/30/2023 03:46 PM   Modules accepted: Orders

## 2023-12-30 NOTE — Telephone Encounter (Signed)
 Copied from CRM #[id number]. Topic: Clinical - Prescription Issue >> Dec 30, 2023  2:23 PM Joesph PARAS wrote: Reason for CRM: Patient is calling to state that her pharmacy does not have a new prescription for her Cellcept  with the correct dosage. Verified that she is taking a total of 2000mg  daily, 1000mg  morning and 1000mg  night, totaling to two tablets at each dose.  Prescription information in chart indicated Dr. Theophilus filled 08/25 at correct dosage and pharmacy.  Patient insists that pharmacy states they have not gotten anything regarding an updated prescription for this patient.   Please check with pharmacy to determine what can be done to get patient her prescription, as she states she does not know what is going on at this point.   Please advise.

## 2024-01-02 ENCOUNTER — Other Ambulatory Visit

## 2024-01-02 ENCOUNTER — Ambulatory Visit: Payer: Self-pay | Admitting: Primary Care

## 2024-01-02 DIAGNOSIS — Z5181 Encounter for therapeutic drug level monitoring: Secondary | ICD-10-CM

## 2024-01-02 LAB — CBC WITH DIFFERENTIAL/PLATELET
Basophils Absolute: 0.1 K/uL (ref 0.0–0.1)
Basophils Relative: 0.6 % (ref 0.0–3.0)
Eosinophils Absolute: 0.3 K/uL (ref 0.0–0.7)
Eosinophils Relative: 2.9 % (ref 0.0–5.0)
HCT: 41.7 % (ref 36.0–46.0)
Hemoglobin: 13.9 g/dL (ref 12.0–15.0)
Lymphocytes Relative: 35.6 % (ref 12.0–46.0)
Lymphs Abs: 3.6 K/uL (ref 0.7–4.0)
MCHC: 33.4 g/dL (ref 30.0–36.0)
MCV: 94.8 fl (ref 78.0–100.0)
Monocytes Absolute: 0.9 K/uL (ref 0.1–1.0)
Monocytes Relative: 9 % (ref 3.0–12.0)
Neutro Abs: 5.3 K/uL (ref 1.4–7.7)
Neutrophils Relative %: 51.9 % (ref 43.0–77.0)
Platelets: 280 K/uL (ref 150.0–400.0)
RBC: 4.4 Mil/uL (ref 3.87–5.11)
RDW: 12.5 % (ref 11.5–15.5)
WBC: 10.2 K/uL (ref 4.0–10.5)

## 2024-01-02 LAB — COMPREHENSIVE METABOLIC PANEL WITH GFR
ALT: 29 U/L (ref 0–35)
AST: 24 U/L (ref 0–37)
Albumin: 4 g/dL (ref 3.5–5.2)
Alkaline Phosphatase: 54 U/L (ref 39–117)
BUN: 14 mg/dL (ref 6–23)
CO2: 29 meq/L (ref 19–32)
Calcium: 9.1 mg/dL (ref 8.4–10.5)
Chloride: 101 meq/L (ref 96–112)
Creatinine, Ser: 0.51 mg/dL (ref 0.40–1.20)
GFR: 102.88 mL/min (ref >60.00)
Glucose, Bld: 91 mg/dL (ref 70–99)
Potassium: 3.5 meq/L (ref 3.5–5.1)
Sodium: 140 meq/L (ref 135–145)
Total Bilirubin: 0.6 mg/dL (ref 0.2–1.2)
Total Protein: 7.1 g/dL (ref 6.0–8.3)

## 2024-01-28 ENCOUNTER — Other Ambulatory Visit (HOSPITAL_COMMUNITY): Payer: Self-pay

## 2024-01-28 ENCOUNTER — Ambulatory Visit: Payer: Self-pay | Admitting: Pulmonary Disease

## 2024-01-28 ENCOUNTER — Encounter: Payer: Self-pay | Admitting: Pulmonary Disease

## 2024-01-28 VITALS — BP 120/90 | HR 104 | Temp 98.3°F | Ht <= 58 in | Wt 136.0 lb

## 2024-01-28 DIAGNOSIS — G8929 Other chronic pain: Secondary | ICD-10-CM

## 2024-01-28 DIAGNOSIS — J8489 Other specified interstitial pulmonary diseases: Secondary | ICD-10-CM

## 2024-01-28 DIAGNOSIS — K449 Diaphragmatic hernia without obstruction or gangrene: Secondary | ICD-10-CM

## 2024-01-28 DIAGNOSIS — J849 Interstitial pulmonary disease, unspecified: Secondary | ICD-10-CM

## 2024-01-28 DIAGNOSIS — K219 Gastro-esophageal reflux disease without esophagitis: Secondary | ICD-10-CM

## 2024-01-28 DIAGNOSIS — Z5181 Encounter for therapeutic drug level monitoring: Secondary | ICD-10-CM

## 2024-01-28 MED ORDER — MYCOPHENOLATE MOFETIL 500 MG PO TABS
1500.0000 mg | ORAL_TABLET | Freq: Two times a day (BID) | ORAL | 1 refills | Status: DC
  Filled 2024-01-28: qty 180, 30d supply, fill #0

## 2024-01-28 NOTE — Progress Notes (Unsigned)
 Olivia [name]    [id number]    1965/11/04  Primary Care Physician:Bender, Damien, DO  Referring Physician: Kandis Damien, DO [address], [address] [place],  [place] [address]  Chief complaint:  Follow-up for interstitial lung disease NSIP fibrosis Did not tolerate pirfenidone  2024 Started nintedanib and CellCept  in February 2025  HPI: 58 y.o. who  has a past medical history of Acute respiratory distress (03/27/2020), Elevated blood pressure reading (12/21/2018), GERD (gastroesophageal reflux disease), Healthcare maintenance (06/28/2019), History of COVID-19 (09/02/2019), Preventative health care (01/15/2013), Pulmonary fibrosis (HCC), and Screening breast examination (11/26/2018).   Cough started in 2019 after she had a fall.  Was seen in 2020 with a CT scan showing interstitial lung disease and was referred to pulmonary clinic.  She has been placed on prednisone  since then with minimal improvement in symptoms.  Recent chest x-ray shows worsening ILD and has been referred to ILD clinic for further evaluation  Complains of chronic cough, chest congestion and dyspnea for the past 3 years.  Denies any joint pain or rash.  She has occasional difficulty swallowing.  Has dry mouth, dry eyes.  Started pirfenidone  on end of Jan 2024.  But she stopped it after few months due to severe GI side effects, weight loss She was hospitalized in October 2024 for for respiratory failure and ILD exacerbation. She was discharged on high dose prednisone  and Bactrim .  Postdischarge she continues to make slow improvements.  Remains on supplemental oxygen, prednisone  at 40 mg/day and Bactrim   Her case was presented at ILD conference in Nov 2024 with diagnosis with progressive fibrotic NSIP. Recommendations were to restart antifibrotics and start immunosuppressant medication such as CellCept .   Interim history: Discussed the use of AI scribe software for clinical note transcription with the  patient, who gave verbal consent to proceed.  History of Present Illness Olivia [name] is a 58 year old female with interstitial lung disease who presents for follow-up of her condition.  She has been diagnosed with non-specific interstitial pneumonia (NSIP) and started treatment with CellCept  and Ofev . CellCept  was initiated about a month ago at a dose of 500 mg twice daily, and Ofev  was started three months ago at a dose of 150 mg twice daily. She tolerates Ofev  well, with only two episodes of diarrhea. Previously, she tried pirfenidone  in 2024, which caused significant stomach side effects. She is currently on prednisone , reduced to 30 mg daily after starting CellCept . She also takes Bactrim  three times a week as a prophylactic antibiotic to prevent Pneumocystis pneumonia, but has experienced issues with pharmacy refills due to authorization requirements.  She has a history of acid reflux and a hiatal hernia, for which she takes Protonix . Her acid reflux is under control and does not cause significant problems. She uses oxygen therapy, requiring 8 liters per minute when walking and 4 liters per minute when sitting, and uses a humidifier to prevent dryness.  Before her lung condition worsened, she was very active, cleaning houses and exercising regularly, but her activity level has significantly declined since her symptoms began. She experiences heart palpitations at times. Her social history includes reliance on her husband for transportation to the pharmacy, as he is the only one working. She has insurance through an 'orange card'.  She feels tired and agitated after active days.   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  Currently stays at home Exposures: No mold, hot tub, Jacuzzi.  No feather pillows or comforters ILD questionnaire 12/27/2021-negative Smoking history: Never smoker Travel  history: Immigrated from [place] in 2000.  No significant recent travel Relevant family history: Sister has asthma.  Outpatient Encounter Medications as of 01/28/2024  Medication Sig   albuterol  (PROVENTIL ) (2.5 MG/3ML) 0.083% nebulizer solution Inhale 3 mLs into the lungs by nebulizer every 4 (four) hours as needed for wheezing or shortness of breath.   benzonatate  (TESSALON ) 100 MG capsule Take 1 capsule (100 mg total) by mouth 3 (three) times daily.   guaiFENesin -dextromethorphan  (ROBITUSSIN DM) 100-10 MG/5ML syrup Take 5 mLs by mouth every 4 (four) hours as needed for cough.   ipratropium-albuterol  (DUONEB) 0.5-2.5 (3) MG/3ML SOLN Take 3 mLs by nebulization 3 (three) times daily.   mycophenolate  (CELLCEPT ) 500 MG tablet Take 2 tablets (1,000 mg total) by mouth 2 (two) times daily.   Nintedanib (OFEV ) 150 MG CAPS Take 1 capsule (150 mg total) by mouth 2 (two) times daily.   Noni 250 MG CAPS Take 250 mg by mouth daily.   pantoprazole  (PROTONIX ) 40 MG tablet Take 1 tablet (40 mg total) by mouth daily.   potassium chloride  SA (KLOR-CON  M) 20 MEQ tablet Take 1 tablet (20 mEq total) by mouth daily.   predniSONE  (DELTASONE ) 20 MG tablet Take 1 tablet (20 mg total) by mouth daily with breakfast.   rosuvastatin  (CRESTOR ) 20 MG tablet Take 1 tablet (20 mg total) by mouth daily.   sodium chloride  (OCEAN) 0.65 % SOLN nasal spray Place 1 spray into both nostrils 3 (three) times daily.   sulfamethoxazole -trimethoprim  (BACTRIM ) 400-80 MG tablet Take 1 tablet by mouth 3 (three) times a week.   No facility-administered encounter medications on file as of 01/28/2024.   Physical Exam: Blood pressure 110/78, pulse (!) 114, height 4' 9 (1.448 m), weight 139 lb 3.2 oz (63.1 kg), SpO2 95%. Gen:      No acute distress HEENT:  EOMI, sclera anicteric Neck:     No masses; no thyromegaly Lungs:    Bibasal crackles CV:         Regular rate and rhythm; no murmurs Abd:      + bowel sounds; soft, non-tender; no  palpable masses, no distension Ext:    No edema; adequate peripheral perfusion Skin:      Warm and dry; no rash Neuro: alert and oriented x 3 Psych: normal mood and affect   Data Reviewed: Imaging: High-resolution CT 06/25/2018-widespread groundglass attenuation in the mid to lower lungs with nodular appearance.  Indeterminate for UIP. High-resolution CT 07/09/2019-similar findings of interstitial lung disease as before with slight improvement CTA 03/27/2020-groundglass opacitie obscured by motion artifact CTA 07/07/2021-interstitial and groundglass opacities with significant progression. Chest x-ray 11/12/2021-increased interstitial opacities CT high-resolution 02/22/2022-interstitial lung disease with basilar predominant fibrosis and probable UIP pattern, coronary atherosclerosis. CT high-resolution 03/11/2023-severe fibrotic interstitial lung disease with significant worsening compared to 02/23/2012 and probable UIP pattern I have reviewed the images personally.  PFTs: 09/02/2019-unable to complete  02/22/2022-unable to complete due to cough  Labs: Hypersensitivity panel 09/01/2018-negative ANA, rheumatoid factor 08/04/2018-negative  Cardiac: Echocardiogram 02/25/2023-LVEF 60 to 65%, grade 1 diastolic dysfunction, normal RV systolic size and function.  Normal PA systolic pressure.  Estimated RVSP 24  Assessment & Plan Interstitial lung disease Interstitial lung disease with a pattern suggestive of NSIP, possibly indicating an autoimmune process though autoimmune labs are negative. She does not have any signs and symptoms of connective tissue disease  or exposures Review of CT scan shows ILD dating back to 2020 initially read as indeterminate but now in probable UIP pattern.  Discussed at multidisciplinary conference with diagnosis of progressive fibrotic NSIP She could not complete PFTs in the past due to cough.  No evidence of pulmonary hypertension on echocardiogram  Currently on  CellCept  and Ofev . CellCept  started one month ago at 500 mg twice daily. Ofev  started in February, currently on 150 mg twice daily. Tolerating Ofev  well with only two episodes of diarrhea. Previous trial of pirfenidone  in 2024 was not well tolerated due to gastrointestinal side effects. Plan to increase CellCept  dose to manage lung inflammation and taper prednisone . Discussed potential future need for lung transplant, with insurance as a possible barrier.  - Increase CellCept  to 1000 mg twice daily with goal to eventually get her to max dose of 1500 mg twice daily - Reduce prednisone  to 20 mg/day as it is causing elevated blood sugar.  Continue Bactrim  - Order blood tests to monitor medication effects.  Check CBC, CMP  Oxygen therapy Requires 8 L/min of oxygen when walking and 4 L/min when sitting. Experiencing dryness likely due to oxygen use. Uses a humidifier to alleviate dryness. - Ensure use of humidifier with oxygen therapy  Pulmonary rehabilitation referral Referral for pulmonary rehabilitation to improve fitness, especially if considering lung transplant. Program involves supervised exercise twice a week for 8-12 weeks. - Refer to pulmonary rehabilitation program  Potential lung transplant Considering lung transplant as a future option. Insurance may be a barrier. Referral to [facility] for evaluation. Discussed the high cost and potential insurance issues with the orange card. - Refer to [facility] for lung transplant evaluation  Acid reflux and hiatal hernia Managed with Protonix , which is effectively controlling symptoms. No current issues reported.   Plan/Recommendations: Continue Ofev .  Increase CellCept  to 1000 mg twice daily Check labs for therapeutic monitoring Reduce prednisone  to 20 mg/day.  Continue Bactrim  Continue supplemental oxygen  PPI for acid reflux  Lonna Coder MD [facility] 01/28/2024, 4:02 PM  CC: Kandis Perkins, DO

## 2024-01-28 NOTE — Patient Instructions (Signed)
  VISIT SUMMARY: Today, we discussed your ongoing symptoms related to interstitial lung disease, including shortness of breath and back pain. We also addressed your gastroesophageal reflux and a recent episode of chest pain.  YOUR PLAN: INTERSTITIAL LUNG DISEASE WITH OXYGEN DEPENDENCE AND IMMUNOSUPPRESSION: You have interstitial lung disease and are experiencing shortness of breath during activities. Your current medications include Ofev , Cellcept , prednisone , and Bactrim . -Continue taking Ofev  and Cellcept  at the current dose. -Continue taking prednisone  20 mg daily. -Continue taking Bactrim  three times a week. -We will perform laboratory tests today to monitor your condition. -If you experience chest pain again, we may refer you to a cardiologist.  CHRONIC BACK PAIN LIKELY RELATED TO IMMUNOSUPPRESSIVE THERAPY: You have chronic back pain, which may be a side effect of your medication. -Continue taking Tylenol  at night as needed for pain. -We will monitor your back pain and may adjust your Cellcept  dose if necessary.  GASTROESOPHAGEAL REFLUX DISEASE WITH HIATAL HERNIA: You have acid reflux and a hiatal hernia, which are causing throat irritation and sticky phlegm. -Continue taking pantoprazole  twice daily. -Elevate the head of your bed to help reduce reflux. -We will monitor your symptoms and may refer you to a gastroenterologist if they worsen.  RESOLVED CHEST PAIN, SINGLE EPISODE: You had a single episode of chest pain that lasted 20 minutes and resolved on its own. -Monitor for any recurrence of chest pain. -If chest pain recurs, we may refer you to a cardiologist.

## 2024-01-29 ENCOUNTER — Telehealth: Payer: Self-pay

## 2024-01-29 LAB — COMPREHENSIVE METABOLIC PANEL WITH GFR
ALT: 37 U/L — ABNORMAL HIGH (ref 0–35)
AST: 38 U/L — ABNORMAL HIGH (ref 0–37)
Albumin: 4.6 g/dL (ref 3.5–5.2)
Alkaline Phosphatase: 59 U/L (ref 39–117)
BUN: 8 mg/dL (ref 6–23)
CO2: 29 meq/L (ref 19–32)
Calcium: 10 mg/dL (ref 8.4–10.5)
Chloride: 101 meq/L (ref 96–112)
Creatinine, Ser: 0.5 mg/dL (ref 0.40–1.20)
GFR: 103.32 mL/min (ref >60.00)
Glucose, Bld: 143 mg/dL — ABNORMAL HIGH (ref 70–99)
Potassium: 4.4 meq/L (ref 3.5–5.1)
Sodium: 138 meq/L (ref 135–145)
Total Bilirubin: 0.5 mg/dL (ref 0.2–1.2)
Total Protein: 7.9 g/dL (ref 6.0–8.3)

## 2024-01-29 LAB — CBC WITH DIFFERENTIAL/PLATELET
Basophils Absolute: 0.1 K/uL (ref 0.0–0.1)
Basophils Relative: 0.7 % (ref 0.0–3.0)
Eosinophils Absolute: 0 K/uL (ref 0.0–0.7)
Eosinophils Relative: 0.3 % (ref 0.0–5.0)
HCT: 43.6 % (ref 36.0–46.0)
Hemoglobin: 14.5 g/dL (ref 12.0–15.0)
Lymphocytes Relative: 10.4 % — ABNORMAL LOW (ref 12.0–46.0)
Lymphs Abs: 1.1 K/uL (ref 0.7–4.0)
MCHC: 33.3 g/dL (ref 30.0–36.0)
MCV: 95.3 fl (ref 78.0–100.0)
Monocytes Absolute: 0.1 K/uL (ref 0.1–1.0)
Monocytes Relative: 0.9 % — ABNORMAL LOW (ref 3.0–12.0)
Neutro Abs: 9.2 K/uL — ABNORMAL HIGH (ref 1.4–7.7)
Neutrophils Relative %: 87.7 % — ABNORMAL HIGH (ref 43.0–77.0)
Platelets: 284 K/uL (ref 150.0–400.0)
RBC: 4.57 Mil/uL (ref 3.87–5.11)
RDW: 12.7 % (ref 11.5–15.5)
WBC: 10.5 K/uL (ref 4.0–10.5)

## 2024-01-29 NOTE — Telephone Encounter (Signed)
 Patient is uninsured. Fills CellCept  through [facility].   Verbal order for CellCept  1,500mg  BID sent to [facility]. Phone: [phone], opt #5 to speak with pharmacy.  CellCept  500mg  tab, Take 3 tabs (1,500mg ) by mouth twice daily.  QTY 360 tab (60 day supply), Refills: 1   Called patient using Interpretor [phone]. Confirmed patient has been taking CellCept  1,000mg  BID for approximately 5 weeks. At last OV with Dr. Theophilus, they discussed she can increase dose to 1,500mg  BID if tolerated. Decrease to 1,000mg  BID if higher dose is not tolerated.   Encouraged pt to contact our clinic if she has issues filling medication.   Aleck Puls, PharmD, BCPS Clinical Pharmacist  [facility]

## 2024-01-30 ENCOUNTER — Other Ambulatory Visit

## 2024-02-04 ENCOUNTER — Ambulatory Visit (INDEPENDENT_AMBULATORY_CARE_PROVIDER_SITE_OTHER): Admitting: Student

## 2024-02-04 ENCOUNTER — Encounter: Payer: Self-pay | Admitting: Student

## 2024-02-04 VITALS — BP 127/88 | HR 97 | Temp 98.3°F | Ht <= 58 in | Wt 138.2 lb

## 2024-02-04 DIAGNOSIS — E785 Hyperlipidemia, unspecified: Secondary | ICD-10-CM

## 2024-02-04 DIAGNOSIS — Z23 Encounter for immunization: Secondary | ICD-10-CM

## 2024-02-04 DIAGNOSIS — R7303 Prediabetes: Secondary | ICD-10-CM

## 2024-02-04 DIAGNOSIS — J849 Interstitial pulmonary disease, unspecified: Secondary | ICD-10-CM

## 2024-02-04 DIAGNOSIS — Z1231 Encounter for screening mammogram for malignant neoplasm of breast: Secondary | ICD-10-CM

## 2024-02-04 DIAGNOSIS — Z1159 Encounter for screening for other viral diseases: Secondary | ICD-10-CM

## 2024-02-04 DIAGNOSIS — Z0000 Encounter for general adult medical examination without abnormal findings: Secondary | ICD-10-CM

## 2024-02-04 NOTE — Progress Notes (Signed)
 CC: 62-month follow-up  HPI: [name] is a 58 y.o. female living with a history stated below and presents today for 3 months follow-up. Please see problem based assessment and plan for additional details.  Virtual Spanish interpreter present during encounter.  Past Medical History:  Diagnosis Date   Acute respiratory distress 03/27/2020   Elevated blood pressure reading 12/21/2018   GERD (gastroesophageal reflux disease)    Healthcare maintenance 06/28/2019   Per patient's recent discharge summary, FOBT positive in ED. Given her overall prognosis and health condition, will hold off discussing colonoscopy.   History of COVID-19 09/02/2019   05/03/19 - Sarscov2 - positive  Did not get mab  Did not get hosp   Preventative health care 01/15/2013   Pulmonary fibrosis (HCC)    Screening breast examination 11/26/2018    Current Outpatient Medications on File Prior to Visit  Medication Sig Dispense Refill   albuterol  (PROVENTIL ) (2.5 MG/3ML) 0.083% nebulizer solution Inhale 3 mLs into the lungs by nebulizer every 4 (four) hours as needed for wheezing or shortness of breath. 75 mL 12   benzonatate  (TESSALON ) 100 MG capsule Take 1 capsule (100 mg total) by mouth 3 (three) times daily. 20 capsule 0   guaiFENesin -dextromethorphan  (ROBITUSSIN DM) 100-10 MG/5ML syrup Take 5 mLs by mouth every 4 (four) hours as needed for cough. 118 mL 0   ipratropium-albuterol  (DUONEB) 0.5-2.5 (3) MG/3ML SOLN Take 3 mLs by nebulization 3 (three) times daily. 360 mL 0   mycophenolate  (CELLCEPT ) 500 MG tablet Take 3 tablets (1,500 mg total) by mouth 2 (two) times daily. 360 tablet 1   Nintedanib (OFEV ) 150 MG CAPS Take 1 capsule (150 mg total) by mouth 2 (two) times daily. 180 capsule 1   Noni 250 MG CAPS Take 250 mg by mouth daily.     pantoprazole  (PROTONIX ) 40 MG tablet Take 1 tablet (40 mg total) by mouth daily. 60 tablet 0   potassium chloride  SA (KLOR-CON  M) 20 MEQ tablet Take 1 tablet (20 mEq  total) by mouth daily. 30 tablet 1   predniSONE  (DELTASONE ) 20 MG tablet Take 1 tablet (20 mg total) by mouth daily with breakfast. 90 tablet 3   rosuvastatin  (CRESTOR ) 20 MG tablet Take 1 tablet (20 mg total) by mouth daily. 30 tablet 11   sodium chloride  (OCEAN) 0.65 % SOLN nasal spray Place 1 spray into both nostrils 3 (three) times daily. 88 mL 0   sulfamethoxazole -trimethoprim  (BACTRIM ) 400-80 MG tablet Take 1 tablet by mouth 3 (three) times a week. 36 tablet 9   No current facility-administered medications on file prior to visit.    Family History  Problem Relation Age of Onset   Diabetes Father    Hypertension Father    Asthma Sister    Diabetes Paternal Grandfather    Breast cancer Neg Hx     Social History   Socioeconomic History   Marital status: Married    Spouse name: Not on file   Number of children: 3   Years of education: Not on file   Highest education level: 9th grade  Occupational History   Not on file  Tobacco Use   Smoking status: Never    Passive exposure: Never   Smokeless tobacco: Never  Vaping Use   Vaping status: Never Used  Substance and Sexual Activity   Alcohol use: Not Currently   Drug use: No   Sexual activity: Not Currently    Birth control/protection: Post-menopausal  Other Topics Concern  Not on file  Social History Narrative   Native of [place], [place]   Husband, [place], alcoholic   3 children, Oldest son in [place]   Grandchildren, 3         Social Drivers of Health   Financial Resource Strain: High Risk (07/08/2021)   Overall Financial Resource Strain (CARDIA)    Difficulty of Paying Living Expenses: Very hard  Food Insecurity: No Food Insecurity (03/06/2023)   Hunger Vital Sign    Worried About Running Out of Food in the Last Year: Never true    Ran Out of Food in the Last Year: Never true  Transportation Needs: No Transportation Needs (03/06/2023)   PRAPARE - Administrator, Civil Service (Medical): No     Lack of Transportation (Non-Medical): No  Physical Activity: Not on file  Stress: Not on file  Social Connections: Not on file  Intimate Partner Violence: Not At Risk (03/06/2023)   Humiliation, Afraid, Rape, and Kick questionnaire    Fear of Current or Ex-Partner: No    Emotionally Abused: No    Physically Abused: No    Sexually Abused: No    Review of Systems: ROS negative except for what is noted on the assessment and plan.  Vitals:   02/04/24 1458  BP: 127/88  Pulse: 97  Temp: 98.3 F (36.8 C)  TempSrc: Oral  Weight: 138 lb 3.2 oz (62.7 kg)  Height: 4' 9 (1.448 m)   Physical Exam: Constitutional: Alert, sitting up in chair with Howe in place, in no acute distress Cardiovascular: regular rate and rhythm Pulmonary/Chest: normal work of breathing on supplemental O2, fairly clear to auscultation bilaterally, able to speak in full sentences Neurological: alert & oriented x 3 Skin: warm and dry  Assessment & Plan:   Assessment & Plan Prediabetes Last A1c 6.1 in 06/2023.  On chronic prednisone  for ILD.  Will repeat A1c today. ILD (interstitial lung disease) (HCC) Follows with pulmonology, last visit 9/24.  On prednisone  20 mg, Bactrim , CellCept  and nintedanib.  Medication adjustments were made at last pulmonology visit with patient reporting improvement of symptoms and able to ambulate better.  Remains on supplemental O2 with 4 L at rest and 7 L with exertion.  Plan for pulmonology evaluation at [facility] in November.  Of note, patient now has financial assistance. Dyslipidemia Last LDL 122 in 07/2021.  Prescribed Crestor  20 mg but patient denies taking it currently.  Discussed restarting statin therapy which patient is contemplating at this time due to pill burden.  Reassess at follow-up visit Preventative health care Encounter for immunization Encounter for screening mammogram for malignant neoplasm of breast Need for hepatitis C screening test Received flu shot  today. Now has financial assistance.  Patient agrees for screening hepatitis C. Last mammogram in 2023.  Agreed for repeat screening mammogram, order placed.  FYI: Patient now has financial assistance [facility]).   Orders Placed This Encounter  Procedures   MM 3D SCREENING MAMMOGRAM BILATERAL BREAST   Flu vaccine trivalent PF, 6mos and older(Flulaval,Afluria,Fluarix,Fluzone )   Hemoglobin A1c   Hepatitis C Ab reflex to Quant PCR   Return in about 3 months (around 05/06/2024) for chequeo de rutina (routine visit).   Patient discussed with Dr. CHARLENA Rosan Ozell Elicia, D.O. [facility], PGY-3 Phone: [phone] Date 02/04/2024 Time 5:51 PM

## 2024-02-04 NOTE — Assessment & Plan Note (Signed)
 Last A1c 6.1 in 06/2023.  On chronic prednisone  for ILD.  Will repeat A1c today.

## 2024-02-04 NOTE — Assessment & Plan Note (Signed)
 Last LDL 122 in 07/2021.  Prescribed Crestor  20 mg but patient denies taking it currently.  Discussed restarting statin therapy which patient is contemplating at this time due to pill burden.  Reassess at follow-up visit

## 2024-02-04 NOTE — Patient Instructions (Addendum)
 Gracias, Sra. Mnica L. Snchez-Rojas, por permitirnos atenderla hoy. Hoy hablamos sobre:  - Seguimiento con los neumlogos de [employer] de [employer] - Anlisis de sangre hoy - [place] solicitada - Famotidina o Pepcid para el reflujo cido  Seguimiento: 3-4 meses  Si tiene alguna pregunta o inquietud, llame a la clnica de medicina interna al [phone].   Thank you, Ms.Baylynn L Sanchez-Rojas for allowing us  to provide your care today. Today we discussed:  -Follow up with the lung doctors at [facility]  -Blood work today  -Ordered mammogram -Famotidine or Pepcid for acid reflux  Follow up: 3-4 months    Should you have any questions or concerns please call the internal medicine clinic at [phone].    Wrangler Penning, D.O. [facility]

## 2024-02-04 NOTE — Assessment & Plan Note (Signed)
 Follows with pulmonology, last visit 9/24.  On prednisone  20 mg, Bactrim , CellCept  and nintedanib.  Medication adjustments were made at last pulmonology visit with patient reporting improvement of symptoms and able to ambulate better.  Remains on supplemental O2 with 4 L at rest and 7 L with exertion.  Plan for pulmonology evaluation at [facility] in November.  Of note, patient now has financial assistance.

## 2024-02-05 ENCOUNTER — Ambulatory Visit: Payer: Self-pay | Admitting: Student

## 2024-02-05 LAB — HEMOGLOBIN A1C
Est. average glucose Bld gHb Est-mCnc: 111 mg/dL
Hgb A1c MFr Bld: 5.5 % (ref 4.8–5.6)

## 2024-02-05 LAB — HCV AB W REFLEX TO QUANT PCR: HCV Ab: NONREACTIVE

## 2024-02-05 LAB — HCV INTERPRETATION

## 2024-02-06 ENCOUNTER — Ambulatory Visit: Payer: Self-pay | Admitting: Pulmonary Disease

## 2024-02-06 DIAGNOSIS — Z5181 Encounter for therapeutic drug level monitoring: Secondary | ICD-10-CM

## 2024-02-06 DIAGNOSIS — J849 Interstitial pulmonary disease, unspecified: Secondary | ICD-10-CM

## 2024-02-11 NOTE — Progress Notes (Signed)
 Internal Medicine Clinic Attending  Case discussed with the resident at the time of the visit.  We reviewed the resident's history and exam and pertinent patient test results.  I agree with the assessment, diagnosis, and plan of care documented in the resident's note.

## 2024-02-20 ENCOUNTER — Telehealth: Payer: Self-pay | Admitting: Pulmonary Disease

## 2024-02-20 NOTE — Telephone Encounter (Signed)
 Pt's husband dropped off paper work for the [employer] for Dr. Theophilus. Placed in his mailbox

## 2024-02-24 ENCOUNTER — Telehealth: Payer: Self-pay

## 2024-02-24 NOTE — Telephone Encounter (Signed)
 Telephoned patient using interpreter, Olivia [name]. Patient requested mammogram scholarship application. Confirmed patient's address. [facility]

## 2024-02-24 NOTE — Telephone Encounter (Signed)
 Received form. Form placed in Dr Jiles box for signature.

## 2024-02-27 ENCOUNTER — Other Ambulatory Visit

## 2024-02-27 DIAGNOSIS — Z5181 Encounter for therapeutic drug level monitoring: Secondary | ICD-10-CM

## 2024-02-27 DIAGNOSIS — J849 Interstitial pulmonary disease, unspecified: Secondary | ICD-10-CM

## 2024-02-27 LAB — COMPREHENSIVE METABOLIC PANEL WITH GFR
ALT: 36 U/L — ABNORMAL HIGH (ref 0–35)
AST: 29 U/L (ref 0–37)
Albumin: 4.3 g/dL (ref 3.5–5.2)
Alkaline Phosphatase: 48 U/L (ref 39–117)
BUN: 13 mg/dL (ref 6–23)
CO2: 30 meq/L (ref 19–32)
Calcium: 9.3 mg/dL (ref 8.4–10.5)
Chloride: 101 meq/L (ref 96–112)
Creatinine, Ser: 0.54 mg/dL (ref 0.40–1.20)
GFR: 101.36 mL/min (ref >60.00)
Glucose, Bld: 99 mg/dL (ref 70–99)
Potassium: 3.7 meq/L (ref 3.5–5.1)
Sodium: 140 meq/L (ref 135–145)
Total Bilirubin: 0.7 mg/dL (ref 0.2–1.2)
Total Protein: 7.4 g/dL (ref 6.0–8.3)

## 2024-03-02 ENCOUNTER — Other Ambulatory Visit: Payer: Self-pay | Admitting: Student

## 2024-03-02 ENCOUNTER — Other Ambulatory Visit (HOSPITAL_COMMUNITY): Payer: Self-pay

## 2024-03-02 DIAGNOSIS — J849 Interstitial pulmonary disease, unspecified: Secondary | ICD-10-CM

## 2024-03-02 MED ORDER — IPRATROPIUM-ALBUTEROL 0.5-2.5 (3) MG/3ML IN SOLN
3.0000 mL | Freq: Three times a day (TID) | RESPIRATORY_TRACT | 0 refills | Status: DC
  Filled 2024-03-02: qty 360, 40d supply, fill #0

## 2024-03-02 MED ORDER — PANTOPRAZOLE SODIUM 40 MG PO TBEC
40.0000 mg | DELAYED_RELEASE_TABLET | Freq: Every day | ORAL | 0 refills | Status: DC
  Filled 2024-03-02: qty 60, 60d supply, fill #0

## 2024-03-15 ENCOUNTER — Other Ambulatory Visit (HOSPITAL_COMMUNITY): Payer: Self-pay

## 2024-03-15 ENCOUNTER — Telehealth: Payer: Self-pay

## 2024-03-16 NOTE — Telephone Encounter (Signed)
 Pt dropped off Ofev  PAP renewal application. Pt is uninsured. Completed form and faxed with med list to [facility]. Will update when we receive a response.  Phone #: [phone] Fax #: [phone]

## 2024-03-19 ENCOUNTER — Other Ambulatory Visit (HOSPITAL_COMMUNITY): Payer: Self-pay

## 2024-03-19 MED ORDER — SULFAMETHOXAZOLE-TRIMETHOPRIM 400-80 MG PO TABS
1.0000 | ORAL_TABLET | Freq: Every day | ORAL | 11 refills | Status: AC
Start: 2024-03-19 — End: ?
  Filled 2024-03-19: qty 30, 30d supply, fill #0

## 2024-03-22 NOTE — Telephone Encounter (Signed)
 Received fax from [facility] that patient is approved for BI Cares PAP through 03/16/2025.   Pharmacy contact # [phone] BI Cares PAP program contact # [phone]  Letter retained in media tab.

## 2024-03-29 ENCOUNTER — Other Ambulatory Visit (HOSPITAL_COMMUNITY): Payer: Self-pay

## 2024-03-30 ENCOUNTER — Other Ambulatory Visit (HOSPITAL_COMMUNITY): Payer: Self-pay

## 2024-03-31 ENCOUNTER — Other Ambulatory Visit

## 2024-03-31 ENCOUNTER — Other Ambulatory Visit (HOSPITAL_COMMUNITY): Payer: Self-pay

## 2024-03-31 ENCOUNTER — Other Ambulatory Visit: Payer: Self-pay | Admitting: *Deleted

## 2024-03-31 DIAGNOSIS — Z5181 Encounter for therapeutic drug level monitoring: Secondary | ICD-10-CM

## 2024-03-31 LAB — COMPREHENSIVE METABOLIC PANEL WITH GFR
ALT: 43 U/L — ABNORMAL HIGH (ref 0–35)
AST: 28 U/L (ref 0–37)
Albumin: 4.3 g/dL (ref 3.5–5.2)
Alkaline Phosphatase: 60 U/L (ref 39–117)
BUN: 10 mg/dL (ref 6–23)
CO2: 31 meq/L (ref 19–32)
Calcium: 9.5 mg/dL (ref 8.4–10.5)
Chloride: 99 meq/L (ref 96–112)
Creatinine, Ser: 0.6 mg/dL (ref 0.40–1.20)
GFR: 98.76 mL/min (ref >60.00)
Glucose, Bld: 115 mg/dL — ABNORMAL HIGH (ref 70–99)
Potassium: 3.1 meq/L — ABNORMAL LOW (ref 3.5–5.1)
Sodium: 138 meq/L (ref 135–145)
Total Bilirubin: 0.7 mg/dL (ref 0.2–1.2)
Total Protein: 7.3 g/dL (ref 6.0–8.3)

## 2024-04-08 ENCOUNTER — Ambulatory Visit
Admission: RE | Admit: 2024-04-08 | Discharge: 2024-04-08 | Disposition: A | Source: Ambulatory Visit | Attending: Internal Medicine | Admitting: Internal Medicine

## 2024-04-08 DIAGNOSIS — Z1231 Encounter for screening mammogram for malignant neoplasm of breast: Secondary | ICD-10-CM

## 2024-04-13 ENCOUNTER — Telehealth: Payer: Self-pay

## 2024-04-13 DIAGNOSIS — J8489 Other specified interstitial pulmonary diseases: Secondary | ICD-10-CM

## 2024-04-13 DIAGNOSIS — J849 Interstitial pulmonary disease, unspecified: Secondary | ICD-10-CM

## 2024-04-13 NOTE — Telephone Encounter (Signed)
 Copied from CRM #[id number]. Topic: Clinical - Medication Question >> Apr 09, 2024  2:58 PM Chantha C wrote: Reason for CRM: Eva from [place] assess with care foundation [phone] option 2 and fax # [phone], patient states dosage changes for mycophenolate  (CELLCEPT ) 500 MG tablet. Justin need verbal dosage verification and clarification on medication. Please advise and call back.  Patient stated she was having a reaction to Cellcept  3000, she down to 2000 daily which works well for her. She also experiences diarrhea as side effect of  Ofev , will discuss with you at office visit 04/19/2024. Patient assistant program  is requesting dose change for Cellept, please advice. Thanks

## 2024-04-19 ENCOUNTER — Ambulatory Visit: Admitting: Pulmonary Disease

## 2024-04-23 ENCOUNTER — Other Ambulatory Visit: Payer: Self-pay | Admitting: Student

## 2024-04-23 ENCOUNTER — Other Ambulatory Visit: Payer: Self-pay

## 2024-04-23 DIAGNOSIS — J849 Interstitial pulmonary disease, unspecified: Secondary | ICD-10-CM

## 2024-04-26 ENCOUNTER — Other Ambulatory Visit (HOSPITAL_COMMUNITY): Payer: Self-pay

## 2024-04-26 MED ORDER — IPRATROPIUM-ALBUTEROL 0.5-2.5 (3) MG/3ML IN SOLN
3.0000 mL | Freq: Three times a day (TID) | RESPIRATORY_TRACT | 0 refills | Status: DC
  Filled 2024-04-26: qty 360, 40d supply, fill #0

## 2024-04-26 NOTE — Telephone Encounter (Signed)
 Medication sent to pharmacy

## 2024-04-26 NOTE — Telephone Encounter (Signed)
 Okay to send prescription for CellCept  dose 1000 mg 2 times daily.

## 2024-04-28 NOTE — Telephone Encounter (Signed)
Routing to Pharmacy

## 2024-05-03 MED ORDER — MYCOPHENOLATE MOFETIL 500 MG PO TABS: 1000.0000 mg | ORAL_TABLET | Freq: Two times a day (BID) | ORAL | 3 refills | Status: AC

## 2024-05-03 NOTE — Telephone Encounter (Signed)
 Returned call to [phone], was told to select option 5 for pharmacy.   Provided verbal order for CellCept  1000mg  BID with QTY 120 and RF: 3 to [facility].   NFN

## 2024-05-07 ENCOUNTER — Telehealth: Payer: Self-pay

## 2024-05-07 NOTE — Telephone Encounter (Signed)
 Copied from CRM #[id number]. Topic: Clinical - Medical Advice >> May 04, 2024 12:51 PM Isabell A wrote: Reason for CRM: Ozell Boxer, MD pulmonologist is requesting to speak with Dr.Mannam in regard to patients PET scan & pt having no ins.   Cell phone #[phone]   Routing to Dr. Theophilus

## 2024-05-13 ENCOUNTER — Ambulatory Visit: Admitting: Pulmonary Disease

## 2024-05-13 ENCOUNTER — Encounter: Payer: Self-pay | Admitting: Pulmonary Disease

## 2024-05-13 VITALS — BP 142/91 | HR 96 | Ht 66.0 in | Wt 132.0 lb

## 2024-05-13 DIAGNOSIS — J849 Interstitial pulmonary disease, unspecified: Secondary | ICD-10-CM

## 2024-05-13 DIAGNOSIS — H539 Unspecified visual disturbance: Secondary | ICD-10-CM

## 2024-05-13 DIAGNOSIS — K219 Gastro-esophageal reflux disease without esophagitis: Secondary | ICD-10-CM

## 2024-05-13 DIAGNOSIS — H538 Other visual disturbances: Secondary | ICD-10-CM

## 2024-05-13 DIAGNOSIS — R918 Other nonspecific abnormal finding of lung field: Secondary | ICD-10-CM

## 2024-05-13 DIAGNOSIS — K449 Diaphragmatic hernia without obstruction or gangrene: Secondary | ICD-10-CM

## 2024-05-13 NOTE — Patient Instructions (Signed)
" °  VISIT SUMMARY: Today, we discussed your lung mass, vision changes, and elevated blood pressure. We reviewed your severe interstitial lung disease and its impact on your treatment options.  YOUR PLAN: LUNG CANCER WITH LYMPH NODE METASTASIS: You have a 1.4 cm lesion in your left lung with lymph node involvement. Due to your severe interstitial lung disease, cancer treatment is not recommended. -Continue your current treatment regimen. -You have been referred to palliative care for support at home. -We discussed and documented your DNR orders with your family.  SEVERE INTERSTITIAL LUNG DISEASE: Your severe interstitial lung disease makes it difficult to manage your lung cancer and undergo invasive procedures. -We will continue to monitor your condition and provide supportive care.  BLURRY VISION, RIGHT EYE: You have been experiencing blurry vision in your right eye for three weeks. We need to check if this is due to possible brain metastasis from your lung cancer. -An MRI of your brain has been ordered to look for any lesions. -You have been referred to an ophthalmologist for further evaluation.  ELEVATED BLOOD PRESSURE: Your blood pressure was elevated during this visit. You do not have a history of chronic hypertension outside of pregnancies. -We rechecked your blood pressure. -Please follow up with your primary care doctor to manage your blood pressure if it remains elevated.                                 Contains text generated by [employer].   "

## 2024-05-13 NOTE — Progress Notes (Addendum)
 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 who  has a past medical history of Acute respiratory distress (03/27/2020), Elevated blood pressure reading (12/21/2018), GERD (gastroesophageal reflux disease), Healthcare maintenance (06/28/2019), History of COVID-19 (09/02/2019), Preventative health care (01/15/2013), Pulmonary fibrosis (HCC), and Screening breast examination (11/26/2018).   Cough started in 2019 after she had a fall.  Was seen in 2020 with a CT scan showing interstitial lung disease and was referred to pulmonary clinic.  She has been placed on prednisone  since then with minimal improvement in symptoms.  Recent chest x-ray shows worsening ILD and has been referred to ILD clinic for further evaluation  Complains of chronic cough, chest congestion and dyspnea for the past 3 years.  Denies any joint pain or rash.  She has occasional difficulty swallowing.  Has dry mouth, dry eyes.  Started pirfenidone  on end of Jan 2024.  But she stopped it after few months due to severe GI side effects, weight loss She was hospitalized in October 2024 for for respiratory failure and ILD exacerbation. She was discharged on high dose prednisone  and Bactrim .  Postdischarge she continues to make slow improvements.  Remains on supplemental oxygen, prednisone  at 40 mg/day and Bactrim   Her case was presented at ILD conference in Nov 2024 with diagnosis with progressive fibrotic NSIP. Recommendations were to restart antifibrotics and start immunosuppressant medication such as CellCept .  She was started on nintedanib and CellCept  in February 2025 CellCept  dose increased to 1000  mg twice daily in May 2025 and the 1500 mg twice daily in September 2025  Interim history: Discussed the use of AI scribe software for clinical note transcription with the patient, who gave verbal consent to proceed. History of Present Illness Olivia [name] is a 59 year old female with interstitial lung disease who presents for follow-up after a [facility] evaluation which showed a FDG avid lung mass  Pulmonary mass and interstitial lung disease - Known severe interstitial lung disease. - Lung mass identified during recent evaluation at [facility]. - PET scan demonstrated hypermetabolic activity in the mass and involved lymph nodes. - Her case was discussed at multidisciplinary conference at [facility] and invasive diagnostics including bronchoscopy and biopsy considered too high risk due to underlying lung disease.  Visual disturbance - Blurry vision in the right eye for approximately three weeks. - Blurriness present when the left eye is covered; left eye vision remains clear.  Elevated blood pressure - Elevated blood pressure during this visit. - No history of chronic hypertension outside of pregnancies.   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  Currently stays at home Exposures: No mold, hot tub, Jacuzzi.  No feather pillows or comforters ILD questionnaire 12/27/2021-negative Smoking history: Never smoker Travel history: Immigrated from [place] in 2000.  No significant recent travel Relevant family history: Sister has asthma.  Outpatient Encounter Medications as of 05/13/2024  Medication Sig   albuterol  (PROVENTIL ) (2.5 MG/3ML) 0.083% nebulizer solution Inhale 3 mLs into the lungs by nebulizer every 4 (four) hours as needed for wheezing  or shortness of breath.   guaiFENesin -dextromethorphan  (ROBITUSSIN DM) 100-10 MG/5ML syrup Take 5 mLs by mouth every 4 (four) hours as needed for cough.    ipratropium-albuterol  (DUONEB) 0.5-2.5 (3) MG/3ML SOLN Take 3 mLs by nebulization 3 (three) times daily.   mycophenolate  (CELLCEPT ) 500 MG tablet Take 2 tablets (1,000 mg total) by mouth 2 (two) times daily.   Nintedanib (OFEV ) 150 MG CAPS Take 1 capsule (150 mg total) by mouth 2 (two) times daily.   Noni 250 MG CAPS Take 250 mg by mouth daily.   pantoprazole  (PROTONIX ) 40 MG tablet Take 1 tablet (40 mg total) by mouth daily.   potassium chloride  SA (KLOR-CON  M) 20 MEQ tablet Take 1 tablet (20 mEq total) by mouth daily.   predniSONE  (DELTASONE ) 20 MG tablet Take 1 tablet (20 mg total) by mouth daily with breakfast.   sodium chloride  (OCEAN) 0.65 % SOLN nasal spray Place 1 spray into both nostrils 3 (three) times daily.   sulfamethoxazole -trimethoprim  (BACTRIM ) 400-80 MG tablet Take 1 tablet by mouth daily.   benzonatate  (TESSALON ) 100 MG capsule Take 1 capsule (100 mg total) by mouth 3 (three) times daily. (Patient not taking: Reported on 05/13/2024)   rosuvastatin  (CRESTOR ) 20 MG tablet Take 1 tablet (20 mg total) by mouth daily. (Patient not taking: Reported on 05/13/2024)   sulfamethoxazole -trimethoprim  (BACTRIM ) 400-80 MG tablet Take 1 tablet by mouth 3 (three) times a week. (Patient not taking: Reported on 05/13/2024)   No facility-administered encounter medications on file as of 05/13/2024.   Vitals:   05/13/24 1520  BP: (!) 140/90  Pulse: 96  Height: 5' 6 (1.676 m)  Weight: 132 lb (59.9 kg)  SpO2: 94% Comment: 10L TANK  TempSrc: Oral  BMI (Calculated): 21.32     Physical Exam GEN: No acute distress CV: Regular rate and rhythm no murmurs LUNGS: Clear to auscultation bilaterally normal respiratory effort SKIN JOINTS: Warm and dry no rash    Data Reviewed: Imaging: High-resolution CT 06/25/2018-widespread groundglass attenuation in the mid to lower lungs with nodular appearance.  Indeterminate for UIP. High-resolution CT 07/09/2019-similar findings of interstitial lung disease as  before with slight improvement CTA 03/27/2020-groundglass opacitie obscured by motion artifact CTA 07/07/2021-interstitial and groundglass opacities with significant progression. Chest x-ray 11/12/2021-increased interstitial opacities CT high-resolution 02/22/2022-interstitial lung disease with basilar predominant fibrosis and probable UIP pattern, coronary atherosclerosis. CT high-resolution 03/11/2023-severe fibrotic interstitial lung disease with significant worsening compared to 02/23/2012 and probable UIP pattern CT high-resolution 12/01/2023-likely developed progression of fibrotic interstitial lung disease in probable UIP pattern I have reviewed the images personally.  CT chest [[facility]] 03/29/2024- 1. Enlarging irregular solid nodule within the lingula measuring 1.4 x 0.9  cm, concerning for potential bronchogenic carcinoma. Consider evaluation  with PET/CT.  2. Pulmonary fibrosis with probable UIP pattern, similar to the comparison  exam.   PET scan 04/27/2024 1.  Hypermetabolic solid nodule in the lingula, concerning for a primary  lung malignancy.  2.  Multifocal hypermetabolic left hilar adenopathy, concerning for nodal  metastatic disease.  3.  No evidence of hypermetabolic disease below the diaphragm.   PFTs: 09/02/2019-unable to complete  02/22/2022-unable to complete due to cough  Labs: Hypersensitivity panel 09/01/2018-negative ANA, rheumatoid factor 08/04/2018-negative  Cardiac: Echocardiogram 02/25/2023-LVEF 60 to 65%, grade 1 diastolic dysfunction, normal RV systolic size and function.  Normal PA systolic pressure.  Estimated RVSP 24 Assessment & Plan Possible lung cancer with lymph node metastasis She likely has lung cancer with a 1.4 cm lesion in the left  lung and lymph node involvement. PET scan shows activity in the lesion and lymph nodes.  Discussed at multidisciplinary conference at [facility] and I reviewed her case with Dr. Ozell Boxer her pulmonologist at [facility].   Biopsy and treatment are not recommended due to severe interstitial lung disease, which would not tolerate cancer treatment. The cancer is expected to progress and eventually affect her life expectancy. She prefers to focus on living each day and has discussed DNR orders with her family, opting for palliative care at home rather than hospitalization or life support. - Continue current treatment regimen - Referred to palliative care for home support - Discussed DNR orders with family and documented in chart.MOST form given to patient to discuss with family  Interstitial lung disease Interstitial lung disease with a pattern suggestive of NSIP, possibly indicating an autoimmune process though autoimmune labs are negative. She does not have any signs and symptoms of connective tissue disease or exposures Review of CT scan shows ILD dating back to 2020 initially read as indeterminate but now in probable UIP pattern.  Discussed at multidisciplinary conference with diagnosis of progressive fibrotic NSIP She could not complete PFTs in the past due to cough.  No evidence of pulmonary hypertension on echocardiogram  She did not tolerate pirfenidone  in the past. Currently on Ofev , cellcept , prednisone  20 mg daily and Bactrim  prophylaxis. Pulmonary rehabilitation not completed due to lack of insurance. Lung transplantation not an option due to insurance status and recent diagnosis of lung mass and possible lung cancer We will continue her current medications for now pending palliative referral and further clarification of goals of care  Blurry vision, right eye, evaluating for possible brain metastasis Blurry vision in the right eye for three weeks. Differential diagnosis includes possible brain metastasis from lung cancer. MRI of the brain is planned to evaluate for lesions affecting vision. - Ordered MRI of the brain - Referred to ophthalmology for further evaluation  Gastroesophageal reflux disease with  hiatal hernia Gastroesophageal reflux disease with hiatal hernia, experiencing throat irritation and sticky phlegm. Currently on pantoprazole  twice daily. Endoscopy considered risky due to oxygen dependence. Symptoms may be exacerbated by acid reflux and hiatal hernia. - Continue pantoprazole    Plan/Recommendations: Continue Ofev , pirfenidone , prednisone , Bactrim  Palliative care referral, DNR code status MRI brain Ophthalmology referral  I personally spent a total of 45 minutes in the care of the patient today including preparing to see the patient, getting/reviewing separately obtained history, performing a medically appropriate exam/evaluation, counseling and educating, referring and communicating with other health care professionals, documenting clinical information in the EHR, independently interpreting results, communicating results, and coordinating care.   Custer Pimenta MD [facility] 05/13/2024, 3:48 PM  CC: Kandis Perkins, DO   "

## 2024-05-28 NOTE — Addendum Note (Signed)
 Addended byBETHA THEOPHILUS ROOSEVELT on: 05/28/2024 02:37 PM   Modules accepted: Orders

## 2024-06-09 ENCOUNTER — Other Ambulatory Visit: Payer: Self-pay

## 2024-06-09 ENCOUNTER — Other Ambulatory Visit: Payer: Self-pay | Admitting: Adult Health

## 2024-06-09 ENCOUNTER — Other Ambulatory Visit: Payer: Self-pay | Admitting: Student

## 2024-06-09 ENCOUNTER — Other Ambulatory Visit (HOSPITAL_COMMUNITY): Payer: Self-pay

## 2024-06-09 DIAGNOSIS — J849 Interstitial pulmonary disease, unspecified: Secondary | ICD-10-CM

## 2024-06-09 MED ORDER — IPRATROPIUM-ALBUTEROL 0.5-2.5 (3) MG/3ML IN SOLN
3.0000 mL | Freq: Three times a day (TID) | RESPIRATORY_TRACT | 3 refills | Status: AC
  Filled 2024-06-09: qty 360, 30d supply, fill #0

## 2024-06-09 MED ORDER — PANTOPRAZOLE SODIUM 40 MG PO TBEC
40.0000 mg | DELAYED_RELEASE_TABLET | Freq: Every day | ORAL | 0 refills | Status: AC
  Filled 2024-06-09: qty 60, 60d supply, fill #0

## 2024-06-11 ENCOUNTER — Other Ambulatory Visit (HOSPITAL_COMMUNITY): Payer: Self-pay

## 2024-06-11 ENCOUNTER — Ambulatory Visit (HOSPITAL_BASED_OUTPATIENT_CLINIC_OR_DEPARTMENT_OTHER): Admission: RE | Admit: 2024-06-11 | Source: Ambulatory Visit

## 2024-06-11 ENCOUNTER — Ambulatory Visit (HOSPITAL_COMMUNITY): Payer: MEDICAID

## 2024-06-11 DIAGNOSIS — J849 Interstitial pulmonary disease, unspecified: Secondary | ICD-10-CM

## 2024-06-11 MED ORDER — GADOBUTROL 1 MMOL/ML IV SOLN
6.0000 mL | Freq: Once | INTRAVENOUS | Status: AC | PRN
  Administered 2024-06-11: 6 mL via INTRAVENOUS
  Filled 2024-06-11: qty 6

## 2024-06-11 MED ORDER — POTASSIUM CHLORIDE CRYS ER 20 MEQ PO TBCR
20.0000 meq | EXTENDED_RELEASE_TABLET | Freq: Every day | ORAL | 1 refills | Status: AC
  Filled 2024-06-11: qty 30, 30d supply, fill #0

## 2024-07-21 ENCOUNTER — Ambulatory Visit: Admitting: Pulmonary Disease
